# Patient Record
Sex: Male | Born: 1945
Health system: Southern US, Community
[De-identification: ages and names within clinical notes are randomized; demographics above are authoritative.]

## PROBLEM LIST (undated history)

## (undated) DIAGNOSIS — J189 Pneumonia, unspecified organism: Secondary | ICD-10-CM

## (undated) DIAGNOSIS — I499 Cardiac arrhythmia, unspecified: Secondary | ICD-10-CM

## (undated) DIAGNOSIS — T4145XA Adverse effect of unspecified anesthetic, initial encounter: Secondary | ICD-10-CM

## (undated) DIAGNOSIS — Z950 Presence of cardiac pacemaker: Secondary | ICD-10-CM

## (undated) DIAGNOSIS — G2581 Restless legs syndrome: Secondary | ICD-10-CM

## (undated) DIAGNOSIS — N400 Enlarged prostate without lower urinary tract symptoms: Secondary | ICD-10-CM

## (undated) DIAGNOSIS — I509 Heart failure, unspecified: Secondary | ICD-10-CM

## (undated) DIAGNOSIS — F32A Depression, unspecified: Secondary | ICD-10-CM

## (undated) DIAGNOSIS — I739 Peripheral vascular disease, unspecified: Secondary | ICD-10-CM

## (undated) DIAGNOSIS — E119 Type 2 diabetes mellitus without complications: Secondary | ICD-10-CM

## (undated) DIAGNOSIS — R351 Nocturia: Secondary | ICD-10-CM

## (undated) DIAGNOSIS — M549 Dorsalgia, unspecified: Secondary | ICD-10-CM

## (undated) DIAGNOSIS — I10 Essential (primary) hypertension: Secondary | ICD-10-CM

## (undated) DIAGNOSIS — Z95810 Presence of automatic (implantable) cardiac defibrillator: Secondary | ICD-10-CM

## (undated) DIAGNOSIS — I4891 Unspecified atrial fibrillation: Secondary | ICD-10-CM

## (undated) DIAGNOSIS — G4730 Sleep apnea, unspecified: Secondary | ICD-10-CM

## (undated) DIAGNOSIS — F329 Major depressive disorder, single episode, unspecified: Secondary | ICD-10-CM

## (undated) DIAGNOSIS — T8859XA Other complications of anesthesia, initial encounter: Secondary | ICD-10-CM

## (undated) DIAGNOSIS — E1142 Type 2 diabetes mellitus with diabetic polyneuropathy: Secondary | ICD-10-CM

## (undated) DIAGNOSIS — R0600 Dyspnea, unspecified: Secondary | ICD-10-CM

## (undated) DIAGNOSIS — I2510 Atherosclerotic heart disease of native coronary artery without angina pectoris: Secondary | ICD-10-CM

## (undated) DIAGNOSIS — E785 Hyperlipidemia, unspecified: Secondary | ICD-10-CM

## (undated) DIAGNOSIS — G629 Polyneuropathy, unspecified: Secondary | ICD-10-CM

## (undated) DIAGNOSIS — M1990 Unspecified osteoarthritis, unspecified site: Secondary | ICD-10-CM

## (undated) DIAGNOSIS — K219 Gastro-esophageal reflux disease without esophagitis: Secondary | ICD-10-CM

## (undated) DIAGNOSIS — J449 Chronic obstructive pulmonary disease, unspecified: Secondary | ICD-10-CM

## (undated) DIAGNOSIS — M109 Gout, unspecified: Secondary | ICD-10-CM

## (undated) DIAGNOSIS — Z86718 Personal history of other venous thrombosis and embolism: Secondary | ICD-10-CM

## (undated) DIAGNOSIS — I639 Cerebral infarction, unspecified: Secondary | ICD-10-CM

## (undated) HISTORY — PX: TONSILLECTOMY AND ADENOIDECTOMY: SUR1326

## (undated) HISTORY — DX: Cerebral infarction, unspecified: I63.9

## (undated) HISTORY — PX: CARPAL TUNNEL RELEASE: SHX101

## (undated) HISTORY — DX: Cardiac arrhythmia, unspecified: I49.9

## (undated) HISTORY — PX: CATARACT EXTRACTION W/ INTRAOCULAR LENS  IMPLANT, BILATERAL: SHX1307

## (undated) HISTORY — DX: Type 2 diabetes mellitus with diabetic polyneuropathy: E11.42

## (undated) HISTORY — PX: JOINT REPLACEMENT: SHX530

---

## 1987-12-07 HISTORY — PX: ULNAR NERVE TRANSPOSITION: SHX2595

## 1993-12-06 HISTORY — PX: LAPAROSCOPIC CHOLECYSTECTOMY: SUR755

## 2006-12-06 HISTORY — PX: TOTAL KNEE ARTHROPLASTY: SHX125

## 2010-12-06 DIAGNOSIS — I639 Cerebral infarction, unspecified: Secondary | ICD-10-CM

## 2010-12-06 HISTORY — DX: Cerebral infarction, unspecified: I63.9

## 2015-08-01 DIAGNOSIS — I509 Heart failure, unspecified: Secondary | ICD-10-CM | POA: Diagnosis not present

## 2015-08-01 DIAGNOSIS — I10 Essential (primary) hypertension: Secondary | ICD-10-CM | POA: Diagnosis not present

## 2015-08-01 DIAGNOSIS — R202 Paresthesia of skin: Secondary | ICD-10-CM | POA: Diagnosis not present

## 2015-08-01 DIAGNOSIS — M1990 Unspecified osteoarthritis, unspecified site: Secondary | ICD-10-CM | POA: Diagnosis not present

## 2015-08-01 DIAGNOSIS — I480 Paroxysmal atrial fibrillation: Secondary | ICD-10-CM | POA: Diagnosis not present

## 2015-08-01 DIAGNOSIS — R739 Hyperglycemia, unspecified: Secondary | ICD-10-CM | POA: Diagnosis not present

## 2015-08-01 DIAGNOSIS — Z8673 Personal history of transient ischemic attack (TIA), and cerebral infarction without residual deficits: Secondary | ICD-10-CM | POA: Diagnosis not present

## 2015-09-12 DIAGNOSIS — Z8673 Personal history of transient ischemic attack (TIA), and cerebral infarction without residual deficits: Secondary | ICD-10-CM | POA: Diagnosis not present

## 2015-09-12 DIAGNOSIS — I480 Paroxysmal atrial fibrillation: Secondary | ICD-10-CM | POA: Diagnosis not present

## 2015-09-12 DIAGNOSIS — I10 Essential (primary) hypertension: Secondary | ICD-10-CM | POA: Diagnosis not present

## 2015-09-18 DIAGNOSIS — I480 Paroxysmal atrial fibrillation: Secondary | ICD-10-CM | POA: Diagnosis not present

## 2015-09-18 DIAGNOSIS — I10 Essential (primary) hypertension: Secondary | ICD-10-CM | POA: Diagnosis not present

## 2015-09-18 DIAGNOSIS — Z8673 Personal history of transient ischemic attack (TIA), and cerebral infarction without residual deficits: Secondary | ICD-10-CM | POA: Diagnosis not present

## 2015-10-15 DIAGNOSIS — I480 Paroxysmal atrial fibrillation: Secondary | ICD-10-CM | POA: Diagnosis not present

## 2015-10-15 DIAGNOSIS — I4891 Unspecified atrial fibrillation: Secondary | ICD-10-CM | POA: Diagnosis not present

## 2015-10-15 DIAGNOSIS — R0609 Other forms of dyspnea: Secondary | ICD-10-CM | POA: Diagnosis not present

## 2015-10-15 DIAGNOSIS — R931 Abnormal findings on diagnostic imaging of heart and coronary circulation: Secondary | ICD-10-CM | POA: Diagnosis not present

## 2015-10-21 DIAGNOSIS — Z23 Encounter for immunization: Secondary | ICD-10-CM | POA: Diagnosis not present

## 2015-11-03 DIAGNOSIS — Z7984 Long term (current) use of oral hypoglycemic drugs: Secondary | ICD-10-CM | POA: Diagnosis not present

## 2015-11-03 DIAGNOSIS — G6289 Other specified polyneuropathies: Secondary | ICD-10-CM | POA: Diagnosis not present

## 2015-11-03 DIAGNOSIS — I10 Essential (primary) hypertension: Secondary | ICD-10-CM | POA: Diagnosis not present

## 2015-11-03 DIAGNOSIS — E1165 Type 2 diabetes mellitus with hyperglycemia: Secondary | ICD-10-CM | POA: Diagnosis not present

## 2015-11-03 DIAGNOSIS — Z23 Encounter for immunization: Secondary | ICD-10-CM | POA: Diagnosis not present

## 2015-11-03 DIAGNOSIS — I4891 Unspecified atrial fibrillation: Secondary | ICD-10-CM | POA: Diagnosis not present

## 2015-11-04 DIAGNOSIS — I10 Essential (primary) hypertension: Secondary | ICD-10-CM | POA: Diagnosis not present

## 2015-11-04 DIAGNOSIS — I429 Cardiomyopathy, unspecified: Secondary | ICD-10-CM | POA: Diagnosis not present

## 2015-11-04 DIAGNOSIS — Z8673 Personal history of transient ischemic attack (TIA), and cerebral infarction without residual deficits: Secondary | ICD-10-CM | POA: Diagnosis not present

## 2015-11-04 DIAGNOSIS — I480 Paroxysmal atrial fibrillation: Secondary | ICD-10-CM | POA: Diagnosis not present

## 2015-11-24 DIAGNOSIS — M9905 Segmental and somatic dysfunction of pelvic region: Secondary | ICD-10-CM | POA: Diagnosis not present

## 2015-11-24 DIAGNOSIS — S336XXA Sprain of sacroiliac joint, initial encounter: Secondary | ICD-10-CM | POA: Diagnosis not present

## 2015-11-25 DIAGNOSIS — M9905 Segmental and somatic dysfunction of pelvic region: Secondary | ICD-10-CM | POA: Diagnosis not present

## 2015-11-25 DIAGNOSIS — S336XXA Sprain of sacroiliac joint, initial encounter: Secondary | ICD-10-CM | POA: Diagnosis not present

## 2015-12-09 DIAGNOSIS — S336XXA Sprain of sacroiliac joint, initial encounter: Secondary | ICD-10-CM | POA: Diagnosis not present

## 2015-12-09 DIAGNOSIS — M9905 Segmental and somatic dysfunction of pelvic region: Secondary | ICD-10-CM | POA: Diagnosis not present

## 2015-12-15 DIAGNOSIS — M9905 Segmental and somatic dysfunction of pelvic region: Secondary | ICD-10-CM | POA: Diagnosis not present

## 2015-12-15 DIAGNOSIS — S336XXA Sprain of sacroiliac joint, initial encounter: Secondary | ICD-10-CM | POA: Diagnosis not present

## 2015-12-25 DIAGNOSIS — S336XXA Sprain of sacroiliac joint, initial encounter: Secondary | ICD-10-CM | POA: Diagnosis not present

## 2015-12-25 DIAGNOSIS — M9905 Segmental and somatic dysfunction of pelvic region: Secondary | ICD-10-CM | POA: Diagnosis not present

## 2015-12-31 DIAGNOSIS — I4891 Unspecified atrial fibrillation: Secondary | ICD-10-CM | POA: Diagnosis not present

## 2015-12-31 DIAGNOSIS — Z79899 Other long term (current) drug therapy: Secondary | ICD-10-CM | POA: Diagnosis not present

## 2015-12-31 DIAGNOSIS — I517 Cardiomegaly: Secondary | ICD-10-CM | POA: Diagnosis not present

## 2016-01-09 DIAGNOSIS — N200 Calculus of kidney: Secondary | ICD-10-CM | POA: Diagnosis not present

## 2016-01-09 DIAGNOSIS — R1031 Right lower quadrant pain: Secondary | ICD-10-CM | POA: Diagnosis not present

## 2016-01-09 DIAGNOSIS — Z0000 Encounter for general adult medical examination without abnormal findings: Secondary | ICD-10-CM | POA: Diagnosis not present

## 2016-01-09 DIAGNOSIS — Z833 Family history of diabetes mellitus: Secondary | ICD-10-CM | POA: Diagnosis not present

## 2016-01-12 ENCOUNTER — Ambulatory Visit
Admission: RE | Admit: 2016-01-12 | Discharge: 2016-01-12 | Disposition: A | Discharge status: Home / Self Care | Payer: Medicare Other | Source: Ambulatory Visit | Attending: Internal Medicine | Admitting: Internal Medicine

## 2016-01-12 ENCOUNTER — Other Ambulatory Visit: Payer: Self-pay | Admitting: Internal Medicine

## 2016-01-12 DIAGNOSIS — M25551 Pain in right hip: Secondary | ICD-10-CM

## 2016-01-12 DIAGNOSIS — G6289 Other specified polyneuropathies: Secondary | ICD-10-CM | POA: Diagnosis not present

## 2016-01-12 DIAGNOSIS — M545 Low back pain: Secondary | ICD-10-CM

## 2016-01-12 DIAGNOSIS — G8929 Other chronic pain: Secondary | ICD-10-CM

## 2016-01-12 DIAGNOSIS — M5136 Other intervertebral disc degeneration, lumbar region: Secondary | ICD-10-CM | POA: Diagnosis not present

## 2016-01-14 DIAGNOSIS — M545 Low back pain: Secondary | ICD-10-CM | POA: Diagnosis not present

## 2016-01-26 DIAGNOSIS — I4891 Unspecified atrial fibrillation: Secondary | ICD-10-CM | POA: Diagnosis not present

## 2016-01-26 DIAGNOSIS — Z79899 Other long term (current) drug therapy: Secondary | ICD-10-CM | POA: Diagnosis not present

## 2016-02-03 DIAGNOSIS — J019 Acute sinusitis, unspecified: Secondary | ICD-10-CM | POA: Diagnosis not present

## 2016-02-04 DIAGNOSIS — M25561 Pain in right knee: Secondary | ICD-10-CM | POA: Diagnosis not present

## 2016-02-23 DIAGNOSIS — R9431 Abnormal electrocardiogram [ECG] [EKG]: Secondary | ICD-10-CM | POA: Diagnosis not present

## 2016-02-23 DIAGNOSIS — I10 Essential (primary) hypertension: Secondary | ICD-10-CM | POA: Diagnosis not present

## 2016-02-23 DIAGNOSIS — I4510 Unspecified right bundle-branch block: Secondary | ICD-10-CM | POA: Diagnosis not present

## 2016-02-23 DIAGNOSIS — I4891 Unspecified atrial fibrillation: Secondary | ICD-10-CM | POA: Diagnosis not present

## 2016-02-23 DIAGNOSIS — Z8673 Personal history of transient ischemic attack (TIA), and cerebral infarction without residual deficits: Secondary | ICD-10-CM | POA: Diagnosis not present

## 2016-02-23 DIAGNOSIS — I480 Paroxysmal atrial fibrillation: Secondary | ICD-10-CM | POA: Diagnosis not present

## 2016-02-23 DIAGNOSIS — I429 Cardiomyopathy, unspecified: Secondary | ICD-10-CM | POA: Diagnosis not present

## 2016-03-18 ENCOUNTER — Other Ambulatory Visit: Payer: Self-pay | Admitting: Orthopedic Surgery

## 2016-03-18 ENCOUNTER — Encounter: Payer: Self-pay | Admitting: Orthopedic Surgery

## 2016-03-18 DIAGNOSIS — M25561 Pain in right knee: Secondary | ICD-10-CM | POA: Diagnosis not present

## 2016-03-18 NOTE — H&P (Signed)
TOTAL KNEE ADMISSION H&P  Patient is being admitted for right total knee arthroplasty, unicompartmental vs total.  Subjective:  Chief Complaint:right knee pain.  HPI: Willie Pineda, 70 y.o. male, has a history of pain and functional disability in the right knee due to arthritis and has failed non-surgical conservative treatments for greater than 12 weeks to includeNSAID's and/or analgesics, corticosteriod injections, flexibility and strengthening excercises, use of assistive devices and activity modification.  Onset of symptoms was gradual, starting 5 years ago with gradually worsening course since that time. The patient noted no past surgery on the right knee(s).  Patient currently rates pain in the right knee(s) at 2 out of 10 with activity. Patient has night pain, worsening of pain with activity and weight bearing, pain that interferes with activities of daily living and crepitus.  Patient has evidence of joint space narrowing of medial compartment by imaging studies. This patient has had left total knee. There is no active infection.  There are no active problems to display for this patient.  No past medical history on file.  No past surgical history on file.   (Not in a hospital admission) Allergies not on file  Social History  Substance Use Topics  . Smoking status: Not on file  . Smokeless tobacco: Not on file  . Alcohol Use: Not on file    No family history on file.   Review of Systems  Cardiovascular: Positive for palpitations.  Musculoskeletal: Positive for joint pain.  Neurological: Positive for sensory change.    Objective:  Physical Exam  Constitutional: He is oriented to person, place, and time. He appears well-developed and well-nourished.  HENT:  Head: Normocephalic and atraumatic.  Eyes: EOM are normal. Pupils are equal, round, and reactive to light.  Neck: Normal range of motion. Neck supple.  Cardiovascular: Normal rate.  An irregularly irregular rhythm  present.  Occasional extrasystoles are present.  Occasional missed beats-irregular  Respiratory: Effort normal and breath sounds normal.  GI: Soft. Bowel sounds are normal.  Musculoskeletal:       Right knee: He exhibits deformity. Tenderness found. Medial joint line tenderness noted.  Left knee well healed scar from tka  Neurological: He is alert and oriented to person, place, and time.  Decreased light touch throughout bi LE's, R.L secondary to DM  Skin: Skin is warm and dry.  Psychiatric: He has a normal mood and affect. His behavior is normal.    Vital signs in last 24 hours: @VSRANGES @  Labs:   There is no height or weight on file to calculate BMI.   Imaging Review Plain radiographs demonstrate severe degenerative joint disease of the right knee(s). The overall alignment issignificant varus. The bone quality appears to be adequate for age and reported activity level.  Assessment/Plan:  End stage arthritis, right knee   The patient history, physical examination, clinical judgment of the provider and imaging studies are consistent with end stage degenerative joint disease of the right knee(s) and total knee arthroplasty is deemed medically necessary. The treatment options including medical management, injection therapy arthroscopy and arthroplasty were discussed at length. The risks and benefits of total knee arthroplasty were presented and reviewed. The risks due to aseptic loosening, infection, stiffness, patella tracking problems, thromboembolic complications and other imponderables were discussed. The patient acknowledged the explanation, agreed to proceed with the plan and consent was signed. Patient is being admitted for inpatient treatment for surgery, pain control, PT, OT, prophylactic antibiotics, VTE prophylaxis, progressive ambulation and ADL's and discharge  planning. The patient is planning to be discharged home with home health services

## 2016-03-25 ENCOUNTER — Encounter (HOSPITAL_COMMUNITY): Payer: Self-pay

## 2016-03-25 ENCOUNTER — Encounter (HOSPITAL_COMMUNITY)
Admission: RE | Admit: 2016-03-25 | Discharge: 2016-03-25 | Disposition: A | Discharge status: Home / Self Care | Payer: Medicare Other | Source: Ambulatory Visit | Attending: Orthopedic Surgery | Admitting: Orthopedic Surgery

## 2016-03-25 DIAGNOSIS — I10 Essential (primary) hypertension: Secondary | ICD-10-CM | POA: Insufficient documentation

## 2016-03-25 DIAGNOSIS — I428 Other cardiomyopathies: Secondary | ICD-10-CM | POA: Diagnosis not present

## 2016-03-25 DIAGNOSIS — Z01818 Encounter for other preprocedural examination: Secondary | ICD-10-CM | POA: Diagnosis not present

## 2016-03-25 DIAGNOSIS — G629 Polyneuropathy, unspecified: Secondary | ICD-10-CM | POA: Insufficient documentation

## 2016-03-25 DIAGNOSIS — Z8673 Personal history of transient ischemic attack (TIA), and cerebral infarction without residual deficits: Secondary | ICD-10-CM | POA: Diagnosis not present

## 2016-03-25 DIAGNOSIS — J449 Chronic obstructive pulmonary disease, unspecified: Secondary | ICD-10-CM | POA: Diagnosis not present

## 2016-03-25 DIAGNOSIS — Z79899 Other long term (current) drug therapy: Secondary | ICD-10-CM | POA: Insufficient documentation

## 2016-03-25 DIAGNOSIS — Z87891 Personal history of nicotine dependence: Secondary | ICD-10-CM | POA: Diagnosis not present

## 2016-03-25 DIAGNOSIS — Z01812 Encounter for preprocedural laboratory examination: Secondary | ICD-10-CM | POA: Insufficient documentation

## 2016-03-25 DIAGNOSIS — K219 Gastro-esophageal reflux disease without esophagitis: Secondary | ICD-10-CM | POA: Insufficient documentation

## 2016-03-25 DIAGNOSIS — I480 Paroxysmal atrial fibrillation: Secondary | ICD-10-CM | POA: Diagnosis not present

## 2016-03-25 DIAGNOSIS — E785 Hyperlipidemia, unspecified: Secondary | ICD-10-CM | POA: Insufficient documentation

## 2016-03-25 DIAGNOSIS — R7303 Prediabetes: Secondary | ICD-10-CM | POA: Diagnosis not present

## 2016-03-25 DIAGNOSIS — Z0183 Encounter for blood typing: Secondary | ICD-10-CM | POA: Insufficient documentation

## 2016-03-25 DIAGNOSIS — Z7901 Long term (current) use of anticoagulants: Secondary | ICD-10-CM | POA: Insufficient documentation

## 2016-03-25 DIAGNOSIS — M1711 Unilateral primary osteoarthritis, right knee: Secondary | ICD-10-CM | POA: Insufficient documentation

## 2016-03-25 HISTORY — DX: Adverse effect of unspecified anesthetic, initial encounter: T41.45XA

## 2016-03-25 HISTORY — DX: Nocturia: R35.1

## 2016-03-25 HISTORY — DX: Major depressive disorder, single episode, unspecified: F32.9

## 2016-03-25 HISTORY — DX: Depression, unspecified: F32.A

## 2016-03-25 HISTORY — DX: Restless legs syndrome: G25.81

## 2016-03-25 HISTORY — DX: Chronic obstructive pulmonary disease, unspecified: J44.9

## 2016-03-25 HISTORY — DX: Other complications of anesthesia, initial encounter: T88.59XA

## 2016-03-25 HISTORY — DX: Personal history of other venous thrombosis and embolism: Z86.718

## 2016-03-25 HISTORY — DX: Dorsalgia, unspecified: M54.9

## 2016-03-25 HISTORY — DX: Polyneuropathy, unspecified: G62.9

## 2016-03-25 HISTORY — DX: Essential (primary) hypertension: I10

## 2016-03-25 HISTORY — DX: Unspecified osteoarthritis, unspecified site: M19.90

## 2016-03-25 HISTORY — DX: Hyperlipidemia, unspecified: E78.5

## 2016-03-25 HISTORY — DX: Benign prostatic hyperplasia without lower urinary tract symptoms: N40.0

## 2016-03-25 HISTORY — DX: Gastro-esophageal reflux disease without esophagitis: K21.9

## 2016-03-25 HISTORY — DX: Heart failure, unspecified: I50.9

## 2016-03-25 HISTORY — DX: Pneumonia, unspecified organism: J18.9

## 2016-03-25 LAB — TYPE AND SCREEN
ABO/RH(D): O POS
Antibody Screen: NEGATIVE

## 2016-03-25 LAB — CBC WITH DIFFERENTIAL/PLATELET
Basophils Absolute: 0.1 10*3/uL (ref 0.0–0.1)
Basophils Relative: 1 %
Eosinophils Absolute: 0.3 10*3/uL (ref 0.0–0.7)
Eosinophils Relative: 4 %
HCT: 46.7 % (ref 39.0–52.0)
Hemoglobin: 15.5 g/dL (ref 13.0–17.0)
Lymphocytes Relative: 31 %
Lymphs Abs: 2.9 10*3/uL (ref 0.7–4.0)
MCH: 31.1 pg (ref 26.0–34.0)
MCHC: 33.2 g/dL (ref 30.0–36.0)
MCV: 93.6 fL (ref 78.0–100.0)
Monocytes Absolute: 1 10*3/uL (ref 0.1–1.0)
Monocytes Relative: 11 %
Neutro Abs: 5 10*3/uL (ref 1.7–7.7)
Neutrophils Relative %: 53 %
Platelets: 224 10*3/uL (ref 150–400)
RBC: 4.99 MIL/uL (ref 4.22–5.81)
RDW: 13.2 % (ref 11.5–15.5)
WBC: 9.3 10*3/uL (ref 4.0–10.5)

## 2016-03-25 LAB — ABO/RH: ABO/RH(D): O POS

## 2016-03-25 LAB — URINALYSIS, ROUTINE W REFLEX MICROSCOPIC
Bilirubin Urine: NEGATIVE
Glucose, UA: NEGATIVE mg/dL
Hgb urine dipstick: NEGATIVE
Ketones, ur: NEGATIVE mg/dL
Leukocytes, UA: NEGATIVE
Nitrite: NEGATIVE
Protein, ur: NEGATIVE mg/dL
Specific Gravity, Urine: 1.011 (ref 1.005–1.030)
pH: 6.5 (ref 5.0–8.0)

## 2016-03-25 LAB — COMPREHENSIVE METABOLIC PANEL
ALT: 28 U/L (ref 17–63)
AST: 33 U/L (ref 15–41)
Albumin: 3.7 g/dL (ref 3.5–5.0)
Alkaline Phosphatase: 100 U/L (ref 38–126)
Anion gap: 12 (ref 5–15)
BUN: 13 mg/dL (ref 6–20)
CO2: 27 mmol/L (ref 22–32)
Calcium: 9.2 mg/dL (ref 8.9–10.3)
Chloride: 101 mmol/L (ref 101–111)
Creatinine, Ser: 1.03 mg/dL (ref 0.61–1.24)
GFR calc Af Amer: >60 mL/min (ref >60)
GFR calc non Af Amer: >60 mL/min (ref >60)
Glucose, Bld: 150 mg/dL — ABNORMAL HIGH (ref 65–99)
Potassium: 4.2 mmol/L (ref 3.5–5.1)
Sodium: 140 mmol/L (ref 135–145)
Total Bilirubin: 1.2 mg/dL (ref 0.3–1.2)
Total Protein: 7.2 g/dL (ref 6.5–8.1)

## 2016-03-25 LAB — PROTIME-INR
INR: 2.46 — ABNORMAL HIGH (ref 0.00–1.49)
Prothrombin Time: 26.4 seconds — ABNORMAL HIGH (ref 11.6–15.2)

## 2016-03-25 LAB — SURGICAL PCR SCREEN
MRSA, PCR: NEGATIVE
Staphylococcus aureus: POSITIVE — AB

## 2016-03-25 LAB — APTT: aPTT: 41 seconds — ABNORMAL HIGH (ref 24–37)

## 2016-03-25 MED ORDER — CHLORHEXIDINE GLUCONATE 4 % EX LIQD: 60.0000 mL | Freq: Once | CUTANEOUS | Status: DC

## 2016-03-25 NOTE — Progress Notes (Addendum)
Anesthesia PAT Evaluation: Patient is a 70 year old male scheduled for right knee unicompartmental versus total joint arthroplasty on 04/06/16 by Dr. Edmonia Lynch. Case is posted for general anesthesia but patient reports he prefers spinal anesthesia if possible.  History includes former smoker, GERD, HLD, HTN, afib/PAF, non-ischemic cardiomyopathy (by cardiology notes; felt to be tachycardia-induced), CHF, peripheral neuropathy, depression, glaucoma, DM2 (borderline), BPH, RLS, COPD, CVA X 3 (one event) with primary infarct to brain stem ~ 2012 (had issues with dysphagia s/p ST X 6 months; no significant residual), cholecystectomy, eye surgery, right ulnar surgery, left TKA '08. For anesthesia history, he reported bronchospasms for 6 weeks after intubation. (There are instances even without anesthesia where he has developed a bronchospasm; worse after drinking wine in combination with laughing. 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  PCP is Dr. Collier Flowers with [facility]. Cardiologist is Dr. Lamar Blinks with [facility] He was seen by Cathlean Sauer, NP on 02/23/16 for a preoperative evaluation. She writes, "He is okay form a cardiovascular standpoint to proceed with knee surgery." She discussed holding Xarelto for 2 days prior to surgery (we discussed 3 days to be considered for spinal anesthesia). 9 month follow-up with Dr. Mauricio Po with updated echo prior to that visit is planned.   Meds include albuterol, amiodarone, Lipitor, clonidine, Lasix, gabapentin, HCTZ, Xalatan ophthalmic, Spiriva, Zoloft, Xarelto, Prilosec, Toprol XL, losartan.  PAT Vitals: BP 145/92, HR 63, RR 20, T 36.4C, O2 sat 96%. WT 301 lb (137 kg). Patient is a pleasant Caucasian male in NAD. Heart sounds regular today. No murmur noted. No carotid bruits noted. Lungs clear. No ankle edema. Good mouth opening. Denied  false or loose teeth.  11/04/15 EKG ([facility]; Care Everywhere): Result Impression: Atrial fibrillation. Right bundle branch block and right axis -possible right ventricular hypertrophy. Consider pulmonary disease. (Tracing requested)  09/18/15 Echo: Requested.  10/15/15 Nuclear stress test: Requested.  He denied history of cardiac cath. Reports previous cardiac testing done at [facility].  He reported a negative sleep study while living in [place].  12/31/15 2VCXR ([facility]; Care Everywhere): INDICATION: Amiodarone therapy FINDINGS: Borderline cardiomegaly. The lungs and pleural spaces are clear. Bony thorax intact. Cholecystectomy clips.  12/31/15 Spirometry ([facility], Care Everywhere): Result Impression:  The diffusing capacity is normal.  Recommend a complete pulmonary function tests with lung volumes to assess for restriction. (Dr. Dionne Milo)  Preoperative labs noted. PT/INR 26.4/2.46, PTT 41. He is still no Xarelto. Plan to repeat PT/PTT on the day of surgery.    Will revisit chart once cardiology tests received. We discussed potential anesthesia options for his surgery. He is comfortable discussing definitive anesthesia plan with his assigned anesthesiologist on the day of surgery.  George Hugh [facility] Short Stay Center/Anesthesiology Phone [phone] 03/25/2016 11:41 AM  Addendum: Additional records received.   - 11/12/11 Polysomnogram [facility]): IMPRESSION: Moderate obstructive sleep apnea. Increased periodic limb movements of sleep. RECOMMENDATIONS: Recommend the patient return to the sleep center for a full night CPAP titration.   From [facility]:  - 02/23/16 EKG: Afib at 85 bpm, occasional ectopic ventricular beat, right BBB and right axis, possible RVH, consider pulmonary disease pattern.  - 10/15/15 Nuclear stress test: Impression: There is no scintigraphic evidence of inducible myocardial ischemia. The  post-stress EF is 39%. Global LV systolic function is moderately reduced. Non-diagnostic ECG. TID ratio is elevated.  Clinical correlation recommended.  - 09/18/15 Echo: The study was technically difficult. The LV is normal in size. Moderate concentric LVH. Moderate diffuse hypokinesis in the LV. LVEF is moderately reduced at 40-45%. The LV is not well visualized. LV diastolic function is normal. LA is moderately dilated. Mild 1+ MR. AV is not well visualized, but is grossly normal. Mild 1+ TR. RVSP is elevated between 30-40 mmHg, consistent with mild pulmonary hypertension. Mild 1+ PR.  12/31/15 Spirometry: FVC 2.79 (61%), FEV1 2.13 (63%), DLCO 23.2 (87%).  As above, patient has been cleared by cardiology. Further evaluation by his anesthesiologist on the day of surgery to ensure no acute changes and to discuss definitive anesthesia plan.  George Hugh [facility] Short Stay Center/Anesthesiology Phone [phone] 03/30/2016 4:39 PM

## 2016-03-25 NOTE — Progress Notes (Signed)
Mupirocin script called into the [facility] in [place].Pt notified who verbalized understanding of instructions.

## 2016-03-25 NOTE — Progress Notes (Addendum)
Cardiologist is Dr.Gerald Renaldo,sees in [place] is [place]  Echo 6 months ago-to request  Stress test 6 months ago-to request  Heart cath denies  EKG 2 wks ago-to request  CXR denies in past yr,last one Jan 2016

## 2016-03-25 NOTE — Progress Notes (Signed)
   03/25/16 1041  OBSTRUCTIVE SLEEP APNEA  Have you ever been diagnosed with sleep apnea through a sleep study? No  Do you snore loudly (loud enough to be heard through closed doors)?  1  Do you often feel tired, fatigued, or sleepy during the daytime (such as falling asleep during driving or talking to someone)? 0  Has anyone observed you stop breathing during your sleep? 0  Do you have, or are you being treated for high blood pressure? 1  Age > 50 (1-yes) 1  Neck circumference greater than:Male 16 inches or larger, Male 17inches or larger? 1  Male Gender (Yes=1) 1  Obstructive Sleep Apnea Score 5  Score 5 or greater  Results sent to PCP

## 2016-03-25 NOTE — Pre-Procedure Instructions (Signed)
Willie Pineda  03/25/2016      [facility] - [place], [place] [address] [name] [address] Phone: [phone] Fax: [phone]    Your procedure is scheduled on Tues, May 2 @ 7:30 AM  Report to [facility] Admitting at 5:30 AM  Call this number if you have problems the morning of surgery:  [phone]   Remember:  Do not eat food or drink liquids after midnight.  Take these medicines the morning of surgery with A SIP OF WATER Albuterol<Bring Your Inhaler With You>,Amiodarone(Pacerone),Clonidine(Catapres-if needed),Metoprolol(Toprol),Omeprazole(Prilosec),Sertraline(Zoloft),and Spiriva(if needed)               No Goody's,BC's,Aleve,Advil,Motrin,Aspirin,Ibuprofen,Fish Oil,or any herbal medications.    Do not wear jewelry.  Do not wear lotions, powders, or colognes.  Men may shave face and neck.  Do not bring valuables to the hospital.  [facility] is not responsible for any belongings or valuables.  Contacts, dentures or bridgework may not be worn into surgery.  Leave your suitcase in the car.  After surgery it may be brought to your room.  For patients admitted to the hospital, discharge time will be determined by your treatment team.  Patients discharged the day of surgery will not be allowed to drive home.    Special instructions:  [place] - Preparing for Surgery  Before surgery, you can play an important role.  Because skin is not sterile, your skin needs to be as free of germs as possible.  You can reduce the number of germs on you skin by washing with CHG (chlorahexidine gluconate) soap before surgery.  CHG is an antiseptic cleaner which kills germs and bonds with the skin to continue killing germs even after washing.  Please DO NOT use if you have an allergy to CHG or antibacterial soaps.  If your skin becomes reddened/irritated stop using the CHG and inform your nurse when you  arrive at Short Stay.  Do not shave (including legs and underarms) for at least 48 hours prior to the first CHG shower.  You may shave your face.  Please follow these instructions carefully:   1.  Shower with CHG Soap the night before surgery and the                                morning of Surgery.  2.  If you choose to wash your hair, wash your hair first as usual with your       normal shampoo.  3.  After you shampoo, rinse your hair and body thoroughly to remove the                      Shampoo.  4.  Use CHG as you would any other liquid soap.  You can apply chg directly       to the skin and wash gently with scrungie or a clean washcloth.  5.  Apply the CHG Soap to your body ONLY FROM THE NECK DOWN.        Do not use on open wounds or open sores.  Avoid contact with your eyes,       ears, mouth and genitals (private parts).  Wash genitals (private parts)       with your normal soap.  6.  Wash thoroughly, paying special attention  to the area where your surgery        will be performed.  7.  Thoroughly rinse your body with warm water from the neck down.  8.  DO NOT shower/wash with your normal soap after using and rinsing off       the CHG Soap.  9.  Pat yourself dry with a clean towel.            10.  Wear clean pajamas.            11.  Place clean sheets on your bed the night of your first shower and do not        sleep with pets.  Day of Surgery  Do not apply any lotions/deoderants the morning of surgery.  Please wear clean clothes to the hospital/surgery center.    Please read over the following fact sheets that you were given. Pain Booklet, Coughing and Deep Breathing, Blood Transfusion Information, MRSA Information and Surgical Site Infection Prevention

## 2016-03-26 DIAGNOSIS — H5015 Alternating exotropia: Secondary | ICD-10-CM | POA: Diagnosis not present

## 2016-03-26 DIAGNOSIS — Z961 Presence of intraocular lens: Secondary | ICD-10-CM | POA: Diagnosis not present

## 2016-03-26 DIAGNOSIS — H40013 Open angle with borderline findings, low risk, bilateral: Secondary | ICD-10-CM | POA: Diagnosis not present

## 2016-03-26 DIAGNOSIS — E119 Type 2 diabetes mellitus without complications: Secondary | ICD-10-CM | POA: Diagnosis not present

## 2016-04-05 MED ORDER — TRANEXAMIC ACID 1000 MG/10ML IV SOLN
1000.0000 mg | INTRAVENOUS | Status: AC
  Administered 2016-04-06: 1000 mg via INTRAVENOUS
  Filled 2016-04-05: qty 10

## 2016-04-06 ENCOUNTER — Inpatient Hospital Stay (HOSPITAL_COMMUNITY)
Admission: RE | Admit: 2016-04-06 | Discharge: 2016-04-07 | DRG: 470 | Disposition: A | Discharge status: Home / Self Care | Payer: Medicare Other | Source: Ambulatory Visit | Attending: Orthopedic Surgery | Admitting: Orthopedic Surgery

## 2016-04-06 ENCOUNTER — Inpatient Hospital Stay (HOSPITAL_COMMUNITY): Payer: Medicare Other | Admitting: Certified Registered Nurse Anesthetist

## 2016-04-06 ENCOUNTER — Inpatient Hospital Stay (HOSPITAL_COMMUNITY): Payer: Medicare Other | Admitting: Vascular Surgery

## 2016-04-06 ENCOUNTER — Encounter (HOSPITAL_COMMUNITY)
Admission: RE | Disposition: A | Discharge status: Home / Self Care | Payer: Self-pay | Source: Ambulatory Visit | Attending: Orthopedic Surgery

## 2016-04-06 ENCOUNTER — Encounter (HOSPITAL_COMMUNITY): Payer: Self-pay | Admitting: *Deleted

## 2016-04-06 DIAGNOSIS — E785 Hyperlipidemia, unspecified: Secondary | ICD-10-CM | POA: Diagnosis present

## 2016-04-06 DIAGNOSIS — N400 Enlarged prostate without lower urinary tract symptoms: Secondary | ICD-10-CM | POA: Diagnosis present

## 2016-04-06 DIAGNOSIS — M1711 Unilateral primary osteoarthritis, right knee: Secondary | ICD-10-CM | POA: Diagnosis present

## 2016-04-06 DIAGNOSIS — J449 Chronic obstructive pulmonary disease, unspecified: Secondary | ICD-10-CM | POA: Diagnosis present

## 2016-04-06 DIAGNOSIS — F329 Major depressive disorder, single episode, unspecified: Secondary | ICD-10-CM | POA: Diagnosis present

## 2016-04-06 DIAGNOSIS — Z7901 Long term (current) use of anticoagulants: Secondary | ICD-10-CM

## 2016-04-06 DIAGNOSIS — M25561 Pain in right knee: Secondary | ICD-10-CM | POA: Diagnosis not present

## 2016-04-06 DIAGNOSIS — G2581 Restless legs syndrome: Secondary | ICD-10-CM | POA: Diagnosis present

## 2016-04-06 DIAGNOSIS — M179 Osteoarthritis of knee, unspecified: Secondary | ICD-10-CM | POA: Diagnosis present

## 2016-04-06 DIAGNOSIS — Z96651 Presence of right artificial knee joint: Secondary | ICD-10-CM | POA: Diagnosis not present

## 2016-04-06 DIAGNOSIS — Z8673 Personal history of transient ischemic attack (TIA), and cerebral infarction without residual deficits: Secondary | ICD-10-CM

## 2016-04-06 DIAGNOSIS — I4891 Unspecified atrial fibrillation: Secondary | ICD-10-CM | POA: Diagnosis present

## 2016-04-06 DIAGNOSIS — H409 Unspecified glaucoma: Secondary | ICD-10-CM | POA: Diagnosis present

## 2016-04-06 DIAGNOSIS — I110 Hypertensive heart disease with heart failure: Secondary | ICD-10-CM | POA: Diagnosis present

## 2016-04-06 DIAGNOSIS — Z471 Aftercare following joint replacement surgery: Secondary | ICD-10-CM | POA: Diagnosis not present

## 2016-04-06 DIAGNOSIS — Z79899 Other long term (current) drug therapy: Secondary | ICD-10-CM | POA: Diagnosis not present

## 2016-04-06 DIAGNOSIS — G8918 Other acute postprocedural pain: Secondary | ICD-10-CM | POA: Diagnosis not present

## 2016-04-06 DIAGNOSIS — Z87891 Personal history of nicotine dependence: Secondary | ICD-10-CM

## 2016-04-06 DIAGNOSIS — M1710 Unilateral primary osteoarthritis, unspecified knee: Secondary | ICD-10-CM | POA: Diagnosis present

## 2016-04-06 DIAGNOSIS — K219 Gastro-esophageal reflux disease without esophagitis: Secondary | ICD-10-CM | POA: Diagnosis present

## 2016-04-06 DIAGNOSIS — I509 Heart failure, unspecified: Secondary | ICD-10-CM | POA: Diagnosis present

## 2016-04-06 DIAGNOSIS — E1142 Type 2 diabetes mellitus with diabetic polyneuropathy: Secondary | ICD-10-CM | POA: Diagnosis present

## 2016-04-06 DIAGNOSIS — Z9889 Other specified postprocedural states: Secondary | ICD-10-CM

## 2016-04-06 HISTORY — PX: PARTIAL KNEE ARTHROPLASTY: SHX2174

## 2016-04-06 LAB — GLUCOSE, CAPILLARY
Glucose-Capillary: 119 mg/dL — ABNORMAL HIGH (ref 65–99)
Glucose-Capillary: 112 mg/dL — ABNORMAL HIGH (ref 65–99)

## 2016-04-06 LAB — APTT: aPTT: 27 seconds (ref 24–37)

## 2016-04-06 LAB — PROTIME-INR
INR: 1.13 (ref 0.00–1.49)
Prothrombin Time: 14.7 seconds (ref 11.6–15.2)

## 2016-04-06 SURGERY — ARTHROPLASTY, KNEE, UNICOMPARTMENTAL
Anesthesia: General | Site: Knee | Laterality: Right

## 2016-04-06 MED ORDER — ARTIFICIAL TEARS OP OINT
TOPICAL_OINTMENT | OPHTHALMIC | Status: AC
  Filled 2016-04-06: qty 3.5

## 2016-04-06 MED ORDER — METOCLOPRAMIDE HCL 5 MG PO TABS: 5.0000 mg | ORAL_TABLET | Freq: Three times a day (TID) | ORAL | Status: DC | PRN

## 2016-04-06 MED ORDER — METHOCARBAMOL 500 MG PO TABS
ORAL_TABLET | ORAL | Status: AC
  Filled 2016-04-06: qty 1

## 2016-04-06 MED ORDER — MORPHINE SULFATE (PF) 2 MG/ML IV SOLN
2.0000 mg | INTRAVENOUS | Status: DC | PRN
  Administered 2016-04-06: 2 mg via INTRAVENOUS
  Filled 2016-04-06: qty 1

## 2016-04-06 MED ORDER — HYDROCHLOROTHIAZIDE 25 MG PO TABS
25.0000 mg | ORAL_TABLET | Freq: Every day | ORAL | Status: DC
  Administered 2016-04-07: 25 mg via ORAL
  Filled 2016-04-06: qty 1

## 2016-04-06 MED ORDER — PHENOL 1.4 % MT LIQD: 1.0000 | OROMUCOSAL | Status: DC | PRN

## 2016-04-06 MED ORDER — ALBUTEROL SULFATE (2.5 MG/3ML) 0.083% IN NEBU
3.0000 mL | INHALATION_SOLUTION | RESPIRATORY_TRACT | Status: DC | PRN
Start: 2016-04-06 — End: 2016-04-07

## 2016-04-06 MED ORDER — METHOCARBAMOL 1000 MG/10ML IJ SOLN
500.0000 mg | Freq: Four times a day (QID) | INTRAMUSCULAR | Status: DC | PRN
  Filled 2016-04-06: qty 5

## 2016-04-06 MED ORDER — FENTANYL CITRATE (PF) 100 MCG/2ML IJ SOLN
INTRAMUSCULAR | Status: DC | PRN
  Administered 2016-04-06 (×2): 50 ug via INTRAVENOUS

## 2016-04-06 MED ORDER — BUPIVACAINE IN DEXTROSE 0.75-8.25 % IT SOLN
INTRATHECAL | Status: DC | PRN
  Administered 2016-04-06: 2 mL via INTRATHECAL

## 2016-04-06 MED ORDER — CLINDAMYCIN PHOSPHATE 900 MG/50ML IV SOLN
INTRAVENOUS | Status: AC
Start: 2016-04-06 — End: 2016-04-06
  Administered 2016-04-06: 900 mg via INTRAVENOUS
  Filled 2016-04-06: qty 50

## 2016-04-06 MED ORDER — MIDAZOLAM HCL 5 MG/5ML IJ SOLN
INTRAMUSCULAR | Status: DC | PRN
  Administered 2016-04-06: 2 mg via INTRAVENOUS

## 2016-04-06 MED ORDER — AMIODARONE HCL 100 MG PO TABS
200.0000 mg | ORAL_TABLET | Freq: Every day | ORAL | Status: DC
  Administered 2016-04-07: 200 mg via ORAL
  Filled 2016-04-06: qty 2

## 2016-04-06 MED ORDER — GABAPENTIN 300 MG PO CAPS
600.0000 mg | ORAL_CAPSULE | Freq: Every day | ORAL | Status: DC
  Administered 2016-04-06: 600 mg via ORAL
  Filled 2016-04-06: qty 2

## 2016-04-06 MED ORDER — PANTOPRAZOLE SODIUM 40 MG PO TBEC
40.0000 mg | DELAYED_RELEASE_TABLET | Freq: Every day | ORAL | Status: DC
  Administered 2016-04-07: 40 mg via ORAL
  Filled 2016-04-06: qty 1

## 2016-04-06 MED ORDER — FENTANYL CITRATE (PF) 250 MCG/5ML IJ SOLN
INTRAMUSCULAR | Status: AC
  Filled 2016-04-06: qty 5

## 2016-04-06 MED ORDER — ATORVASTATIN CALCIUM 20 MG PO TABS
20.0000 mg | ORAL_TABLET | Freq: Every day | ORAL | Status: DC
  Administered 2016-04-07: 20 mg via ORAL
  Filled 2016-04-06: qty 1

## 2016-04-06 MED ORDER — 0.9 % SODIUM CHLORIDE (POUR BTL) OPTIME
TOPICAL | Status: DC | PRN
  Administered 2016-04-06: 1000 mL

## 2016-04-06 MED ORDER — ONDANSETRON HCL 4 MG/2ML IJ SOLN: 4.0000 mg | Freq: Four times a day (QID) | INTRAMUSCULAR | Status: DC | PRN

## 2016-04-06 MED ORDER — ACETAMINOPHEN 650 MG RE SUPP: 650.0000 mg | Freq: Four times a day (QID) | RECTAL | Status: DC | PRN

## 2016-04-06 MED ORDER — SODIUM CHLORIDE 0.9 % IR SOLN
Status: DC | PRN
  Administered 2016-04-06 (×2): 1000 mL

## 2016-04-06 MED ORDER — LATANOPROST 0.005 % OP SOLN
1.0000 [drp] | Freq: Every day | OPHTHALMIC | Status: DC
  Administered 2016-04-06: 1 [drp] via OPHTHALMIC
  Filled 2016-04-06: qty 2.5

## 2016-04-06 MED ORDER — SERTRALINE HCL 100 MG PO TABS
100.0000 mg | ORAL_TABLET | Freq: Every day | ORAL | Status: DC
  Administered 2016-04-07: 100 mg via ORAL
  Filled 2016-04-06: qty 1

## 2016-04-06 MED ORDER — METOCLOPRAMIDE HCL 5 MG/ML IJ SOLN: 5.0000 mg | Freq: Three times a day (TID) | INTRAMUSCULAR | Status: DC | PRN

## 2016-04-06 MED ORDER — PROPOFOL 10 MG/ML IV BOLUS
INTRAVENOUS | Status: AC
  Filled 2016-04-06: qty 40

## 2016-04-06 MED ORDER — FUROSEMIDE 40 MG PO TABS
80.0000 mg | ORAL_TABLET | Freq: Two times a day (BID) | ORAL | Status: DC
  Administered 2016-04-07: 80 mg via ORAL
  Filled 2016-04-06: qty 2

## 2016-04-06 MED ORDER — OXYCODONE HCL 5 MG PO TABS
ORAL_TABLET | ORAL | Status: AC
  Filled 2016-04-06: qty 2

## 2016-04-06 MED ORDER — LOSARTAN POTASSIUM 50 MG PO TABS
100.0000 mg | ORAL_TABLET | Freq: Every day | ORAL | Status: DC
  Administered 2016-04-07: 100 mg via ORAL
  Filled 2016-04-06: qty 2

## 2016-04-06 MED ORDER — ONDANSETRON HCL 4 MG/2ML IJ SOLN
INTRAMUSCULAR | Status: AC
  Filled 2016-04-06: qty 2

## 2016-04-06 MED ORDER — PHENYLEPHRINE HCL 10 MG/ML IJ SOLN
INTRAMUSCULAR | Status: DC | PRN
  Administered 2016-04-06 (×2): 80 ug via INTRAVENOUS

## 2016-04-06 MED ORDER — DOCUSATE SODIUM 100 MG PO CAPS
100.0000 mg | ORAL_CAPSULE | Freq: Two times a day (BID) | ORAL | Status: DC
  Administered 2016-04-07: 100 mg via ORAL
  Filled 2016-04-06: qty 1

## 2016-04-06 MED ORDER — ROCURONIUM BROMIDE 50 MG/5ML IV SOLN
INTRAVENOUS | Status: AC
  Filled 2016-04-06: qty 1

## 2016-04-06 MED ORDER — CLINDAMYCIN PHOSPHATE 600 MG/50ML IV SOLN
600.0000 mg | Freq: Four times a day (QID) | INTRAVENOUS | Status: AC
  Administered 2016-04-06: 600 mg via INTRAVENOUS
  Filled 2016-04-06 (×2): qty 50

## 2016-04-06 MED ORDER — CLINDAMYCIN PHOSPHATE 900 MG/50ML IV SOLN: 900.0000 mg | INTRAVENOUS | Status: DC

## 2016-04-06 MED ORDER — CLONIDINE HCL 0.1 MG PO TABS: 0.1000 mg | ORAL_TABLET | Freq: Every day | ORAL | Status: DC | PRN

## 2016-04-06 MED ORDER — RIVAROXABAN 20 MG PO TABS
20.0000 mg | ORAL_TABLET | Freq: Every day | ORAL | Status: DC
Start: 2016-04-06 — End: 2016-04-07

## 2016-04-06 MED ORDER — CELECOXIB 200 MG PO CAPS
200.0000 mg | ORAL_CAPSULE | Freq: Two times a day (BID) | ORAL | Status: DC
  Administered 2016-04-06 – 2016-04-07 (×2): 200 mg via ORAL
  Filled 2016-04-06 (×2): qty 1

## 2016-04-06 MED ORDER — OXYCODONE HCL 5 MG PO TABS
5.0000 mg | ORAL_TABLET | ORAL | Status: DC | PRN
  Administered 2016-04-06 – 2016-04-07 (×7): 10 mg via ORAL
  Filled 2016-04-06 (×6): qty 2

## 2016-04-06 MED ORDER — PROPOFOL 500 MG/50ML IV EMUL
INTRAVENOUS | Status: DC | PRN
  Administered 2016-04-06: 10:00:00 via INTRAVENOUS
  Administered 2016-04-06: 75 ug/kg/min via INTRAVENOUS

## 2016-04-06 MED ORDER — PROPOFOL 10 MG/ML IV BOLUS
INTRAVENOUS | Status: DC | PRN
  Administered 2016-04-06: 40 mg via INTRAVENOUS

## 2016-04-06 MED ORDER — LACTATED RINGERS IV SOLN
INTRAVENOUS | Status: DC | PRN
  Administered 2016-04-06 (×2): via INTRAVENOUS

## 2016-04-06 MED ORDER — ACETAMINOPHEN 325 MG PO TABS: 650.0000 mg | ORAL_TABLET | Freq: Four times a day (QID) | ORAL | Status: DC | PRN

## 2016-04-06 MED ORDER — DEXAMETHASONE SODIUM PHOSPHATE 10 MG/ML IJ SOLN
10.0000 mg | Freq: Once | INTRAMUSCULAR | Status: AC
  Administered 2016-04-07: 10 mg via INTRAVENOUS
  Filled 2016-04-06: qty 1

## 2016-04-06 MED ORDER — SODIUM CHLORIDE 0.9 % IV SOLN: INTRAVENOUS | Status: DC

## 2016-04-06 MED ORDER — METOPROLOL SUCCINATE ER 50 MG PO TB24
50.0000 mg | ORAL_TABLET | Freq: Every day | ORAL | Status: DC
  Administered 2016-04-07: 50 mg via ORAL
  Filled 2016-04-06: qty 1

## 2016-04-06 MED ORDER — TIOTROPIUM BROMIDE MONOHYDRATE 18 MCG IN CAPS
18.0000 ug | ORAL_CAPSULE | Freq: Every day | RESPIRATORY_TRACT | Status: DC
  Administered 2016-04-07: 18 ug via RESPIRATORY_TRACT
  Filled 2016-04-06: qty 5

## 2016-04-06 MED ORDER — DEXTROSE-NACL 5-0.45 % IV SOLN
INTRAVENOUS | Status: AC
  Administered 2016-04-06: 14:00:00 via INTRAVENOUS

## 2016-04-06 MED ORDER — METHOCARBAMOL 500 MG PO TABS
500.0000 mg | ORAL_TABLET | Freq: Four times a day (QID) | ORAL | Status: DC | PRN
  Administered 2016-04-06 – 2016-04-07 (×2): 500 mg via ORAL
  Filled 2016-04-06: qty 1

## 2016-04-06 MED ORDER — ONDANSETRON HCL 4 MG PO TABS: 4.0000 mg | ORAL_TABLET | Freq: Four times a day (QID) | ORAL | Status: DC | PRN

## 2016-04-06 MED ORDER — PHENYLEPHRINE HCL 10 MG/ML IJ SOLN
10.0000 mg | INTRAVENOUS | Status: DC | PRN
  Administered 2016-04-06: 30 ug/min via INTRAVENOUS

## 2016-04-06 MED ORDER — MENTHOL 3 MG MT LOZG: 1.0000 | LOZENGE | OROMUCOSAL | Status: DC | PRN

## 2016-04-06 MED ORDER — MIDAZOLAM HCL 2 MG/2ML IJ SOLN
INTRAMUSCULAR | Status: AC
  Filled 2016-04-06: qty 2

## 2016-04-06 MED ORDER — BUPIVACAINE-EPINEPHRINE (PF) 0.5% -1:200000 IJ SOLN
INTRAMUSCULAR | Status: DC | PRN
  Administered 2016-04-06: 30 mL via PERINEURAL

## 2016-04-06 SURGICAL SUPPLY — 57 items
BANDAGE ESMARK 6X9 LF (GAUZE/BANDAGES/DRESSINGS) ×1 IMPLANT
BNDG ELASTIC 6X15 VLCR STRL LF (GAUZE/BANDAGES/DRESSINGS) ×2 IMPLANT
BNDG ESMARK 6X9 LF (GAUZE/BANDAGES/DRESSINGS) ×2
BONE CEMENT PALACOSE (Orthopedic Implant) ×4 IMPLANT
BOWL SMART MIX CTS (DISPOSABLE) ×2 IMPLANT
CAPT KNEE PARTIAL 2 ×2 IMPLANT
CEMENT BONE PALACOSE (Orthopedic Implant) ×2 IMPLANT
CHLORAPREP W/TINT 26ML (MISCELLANEOUS) ×2 IMPLANT
CLSR STERI-STRIP ANTIMIC 1/2X4 (GAUZE/BANDAGES/DRESSINGS) IMPLANT
COVER SURGICAL LIGHT HANDLE (MISCELLANEOUS) ×2 IMPLANT
CUFF TOURNIQUET SINGLE 34IN LL (TOURNIQUET CUFF) ×2 IMPLANT
DRAPE EXTREMITY T 121X128X90 (DRAPE) ×2 IMPLANT
DRAPE IMP U-DRAPE 54X76 (DRAPES) ×4 IMPLANT
DRAPE PROXIMA HALF (DRAPES) IMPLANT
DRAPE U-SHAPE 47X51 STRL (DRAPES) ×2 IMPLANT
DRSG MEPILEX BORDER 4X4 (GAUZE/BANDAGES/DRESSINGS) IMPLANT
DRSG MEPILEX BORDER 4X8 (GAUZE/BANDAGES/DRESSINGS) ×2 IMPLANT
ELECT CAUTERY BLADE 6.4 (BLADE) ×2 IMPLANT
ELECT REM PT RETURN 9FT ADLT (ELECTROSURGICAL) ×2
ELECTRODE REM PT RTRN 9FT ADLT (ELECTROSURGICAL) ×1 IMPLANT
EVACUATOR 1/8 PVC DRAIN (DRAIN) IMPLANT
FACESHIELD WRAPAROUND (MASK) IMPLANT
GLOVE BIO SURGEON STRL SZ7 (GLOVE) ×2 IMPLANT
GLOVE BIO SURGEON STRL SZ7.5 (GLOVE) ×6 IMPLANT
GLOVE BIO SURGEON STRL SZ8 (GLOVE) ×2 IMPLANT
GLOVE BIOGEL PI IND STRL 7.0 (GLOVE) ×1 IMPLANT
GLOVE BIOGEL PI IND STRL 8 (GLOVE) ×3 IMPLANT
GLOVE BIOGEL PI INDICATOR 7.0 (GLOVE) ×1
GLOVE BIOGEL PI INDICATOR 8 (GLOVE) ×3
GLOVE SURG SS PI 8.0 STRL IVOR (GLOVE) ×4 IMPLANT
GOWN STRL REUS W/ TWL LRG LVL3 (GOWN DISPOSABLE) ×4 IMPLANT
GOWN STRL REUS W/ TWL XL LVL3 (GOWN DISPOSABLE) ×2 IMPLANT
GOWN STRL REUS W/TWL LRG LVL3 (GOWN DISPOSABLE) ×4
GOWN STRL REUS W/TWL XL LVL3 (GOWN DISPOSABLE) ×2
HANDPIECE INTERPULSE COAX TIP (DISPOSABLE) ×1
IMMOBILIZER KNEE 22 UNIV (SOFTGOODS) ×2 IMPLANT
KIT BASIN OR (CUSTOM PROCEDURE TRAY) ×2 IMPLANT
KIT ROOM TURNOVER OR (KITS) ×2 IMPLANT
MANIFOLD NEPTUNE II (INSTRUMENTS) ×2 IMPLANT
MARKER SKIN DUAL TIP RULER LAB (MISCELLANEOUS) ×2 IMPLANT
NS IRRIG 1000ML POUR BTL (IV SOLUTION) ×2 IMPLANT
PACK BLADE SAW RECIP 70 3 PT (BLADE) ×2 IMPLANT
PACK TOTAL JOINT (CUSTOM PROCEDURE TRAY) ×2 IMPLANT
PACK UNIVERSAL I (CUSTOM PROCEDURE TRAY) IMPLANT
PAD ARMBOARD 7.5X6 YLW CONV (MISCELLANEOUS) ×4 IMPLANT
SET HNDPC FAN SPRY TIP SCT (DISPOSABLE) ×1 IMPLANT
STAPLER VISISTAT 35W (STAPLE) IMPLANT
SUCTION FRAZIER HANDLE 10FR (MISCELLANEOUS) ×1
SUCTION TUBE FRAZIER 10FR DISP (MISCELLANEOUS) ×1 IMPLANT
SUT MNCRL AB 4-0 PS2 18 (SUTURE) IMPLANT
SUT MON AB 2-0 CT1 27 (SUTURE) ×4 IMPLANT
SUT VIC AB 1 CTX 36 (SUTURE) ×2
SUT VIC AB 1 CTX36XBRD ANBCTR (SUTURE) ×2 IMPLANT
TOWEL OR 17X24 6PK STRL BLUE (TOWEL DISPOSABLE) IMPLANT
TOWEL OR 17X26 10 PK STRL BLUE (TOWEL DISPOSABLE) ×2 IMPLANT
TUBE CONNECTING 12X1/4 (SUCTIONS) ×2 IMPLANT
YANKAUER SUCT BULB TIP NO VENT (SUCTIONS) ×2 IMPLANT

## 2016-04-06 NOTE — Anesthesia Procedure Notes (Addendum)
Spinal Patient location during procedure: OR Start time: 04/06/2016 7:45 AM End time: 04/06/2016 7:50 AM Staffing Anesthesiologist: Lillia Abed Performed by: anesthesiologist  Preanesthetic Checklist Completed: patient identified, site marked, surgical consent, pre-op evaluation, timeout performed, IV checked, risks and benefits discussed and monitors and equipment checked Spinal Block Patient position: sitting Prep: Betadine Patient monitoring: heart rate, cardiac monitor, continuous pulse ox and blood pressure Approach: midline Location: L3-4 Injection technique: single-shot Needle Needle type: Pencan  Needle gauge: 24 G Needle length: 9 cm Needle insertion depth: 10 cm  Procedure Name: MAC Date/Time: 04/06/2016 7:50 AM Performed by: [name] Pre-anesthesia Checklist: Patient identified, Emergency Drugs available, Suction available, Patient being monitored and Timeout performed Patient Re-evaluated:Patient Re-evaluated prior to inductionOxygen Delivery Method: Simple face mask Preoxygenation: Pre-oxygenation with 100% oxygen Intubation Type: IV induction Placement Confirmation: positive ETCO2 Dental Injury: Teeth and Oropharynx as per pre-operative assessment     Anesthesia Regional Block:  Adductor canal block  Pre-Anesthetic Checklist: ,, timeout performed, Correct Patient, Correct Site, Correct Laterality, Correct Procedure, Correct Position, site marked, Risks and benefits discussed,  Surgical consent,  Pre-op evaluation,  At surgeon's request and post-op pain management  Laterality: Right  Prep: chloraprep       Needles:  Injection technique: Single-shot  Needle Type: Echogenic Stimulator Needle     Needle Length: 9cm 9 cm Needle Gauge: 21 and 21 G    Additional Needles:  Procedures: ultrasound guided (picture in chart) Adductor canal block Narrative:  Start time: 04/06/2016 10:20 AM End time: 04/06/2016 10:30 AM Injection made incrementally with  aspirations every 5 mL.  Performed by: Personally  Anesthesiologist: Lillia Abed  Additional Notes: Monitors applied. Patient sedated. Sterile prep and drape,hand hygiene and sterile gloves were used. Relevant anatomy identified.Needle position confirmed.Local anesthetic injected incrementally after negative aspiration. Local anesthetic spread visualized around nerve(s). Vascular puncture avoided. No complications. Image printed for medical record.The patient tolerated the procedure well.    Lillia Abed MD

## 2016-04-06 NOTE — Anesthesia Preprocedure Evaluation (Signed)
Anesthesia Evaluation  Patient identified by MRN, date of birth, ID band Patient awake    Reviewed: Allergy & Precautions, NPO status , Patient's Chart, lab work & pertinent test results  Airway Mallampati: II  TM Distance: >3 FB Neck ROM: Full    Dental   Pulmonary former smoker,    Pulmonary exam normal        Cardiovascular hypertension, Pt. on medications Normal cardiovascular exam     Neuro/Psych Depression CVA    GI/Hepatic GERD  Medicated and Controlled,  Endo/Other  diabetes, Type 2, Oral Hypoglycemic Agents  Renal/GU      Musculoskeletal   Abdominal   Peds  Hematology   Anesthesia Other Findings   Reproductive/Obstetrics                             Anesthesia Physical Anesthesia Plan  ASA: III  Anesthesia Plan: Spinal   Post-op Pain Management:  Regional for Post-op pain   Induction: Intravenous  Airway Management Planned: Simple Face Mask  Additional Equipment:   Intra-op Plan:   Post-operative Plan:   Informed Consent: I have reviewed the patients History and Physical, chart, labs and discussed the procedure including the risks, benefits and alternatives for the proposed anesthesia with the patient or authorized representative who has indicated his/her understanding and acceptance.     Plan Discussed with: CRNA and Surgeon  Anesthesia Plan Comments:         Anesthesia Quick Evaluation

## 2016-04-06 NOTE — Interval H&P Note (Signed)
History and Physical Interval Note:  04/06/2016 7:28 AM  Willie Pineda  has presented today for surgery, with the diagnosis of OA RIGHT UNI  The various methods of treatment have been discussed with the patient and family. After consideration of risks, benefits and other options for treatment, the patient has consented to  Procedure(s): UNICOMPARTMENTAL KNEE (Right) as a surgical intervention .  The patient's history has been reviewed, patient examined, no change in status, stable for surgery.  I have reviewed the patient's chart and labs.  Questions were answered to the patient's satisfaction.     Tymel Conely D

## 2016-04-06 NOTE — Progress Notes (Signed)
Utilization review completed.  

## 2016-04-06 NOTE — Op Note (Signed)
04/06/2016  9:50 AM  PATIENT:  Willie Pineda    PRE-OPERATIVE DIAGNOSIS:  OA RIGHT UNI  POST-OPERATIVE DIAGNOSIS:  Same  PROCEDURE:  UNICOMPARTMENTAL KNEE  SURGEON:  MURPHY, TIMOTHY D, MD  PHYSICIAN ASSISTANT: Roxan Hockey, PA-C, She was present and scrubbed throughout the case, critical for completion in a timely fashion, and for retraction, instrumentation, and closure.   ANESTHESIA:   General  PREOPERATIVE INDICATIONS:  Willie Pineda is a  70 y.o. male with a diagnosis of OA RIGHT UNI who failed conservative measures and elected for surgical management.    The risks benefits and alternatives were discussed with the patient preoperatively including but not limited to the risks of infection, bleeding, nerve injury, cardiopulmonary complications, blood clots, the need for revision surgery, among others, and the patient was willing to proceed.  OPERATIVE IMPLANTS: Biomet Oxford mobile bearing medial compartment arthroplasty. Femoral Component: large. Tibial tray: D, Size 3 poly.   OPERATIVE FINDINGS: Endstage grade 4 medial compartment osteoarthritis. No significant changes in the lateral or patellofemoral joint  OPERATIVE PROCEDURE: The patient was brought to the operating room placed in supine position. General anesthesia was administered. IV antibiotics were given. The lower extremity was placed in the legholder and prepped and draped in usual sterile fashion.  Time out was performed.  The leg was elevated and exsanguinated and the tourniquet was inflated. Anteromedial incision was performed, and I took care to preserve the MCL. Parapatellar incision was carried out, and the osteophytes were excised, along with the medial meniscus and a small portion of the fat pad.  The extra medullary tibial cutting jig was applied, using the spoon and the 46mm G-Clamp, and I took care to protect the anterior cruciate ligament insertion and the tibial spine. The medial collateral ligament was  also protected, and I resected my proximal tibia, matching the anatomic slope.   The proximal tibial bony cut was removed in one piece, and I turned my attention to the femur.  The intramedullary femoral rod was placed using the drill, and then using the appropriate reference, I assembled the femoral jig, setting my posterior cutting block. I resected my posterior femur, and then measured my gap.   I then used the mill to match the extension gap to the flexion gap. The gaps were then measured again with the appropriate feeler gauges. Once I had balanced flexion and extension gaps, I then completed the preparation of the femur.  I milled off the anterior aspect of the distal femur to prevent impingement. I also exposed the tibia, and selected the above-named component, and then used the cutting jig to prepare the keel slot on the tibia. I also used the awl to curette out the bone to complete the preparation of the keel. The back wall was intact.  I then placed trial components, and it was found to have excellent motion, and appropriate balance.  I then cemented the components into place, cementing the tibia first, removing all excess cement, and then cementing the femur.  All loose cement was removed.  The real polyethylene insert was applied manually, and the knee was taken through functional range of motion, and found to have excellent stability and restoration of joint motion, with excellent balance.  The wounds were irrigated copiously, and the parapatellar tissue closed with Vicryl, followed by Vicryl for the subcutaneous tissue, with routine closure with Steri-Strips and sterile gauze.  The tourniquet was released, and the patient was awakened and extubated and returned to PACU in  stable and satisfactory condition. There were no complications.  POSTOPERATIVE PLAN: DVT px will consist of SCD's and Xarelto, WBAT     Renette Butters, MD  This note was generated using a template and  dragon dictation system. In light of that, I have reviewed the note and all aspects of it are applicable to this case. Any dictation errors are due to the computerized dictation system.

## 2016-04-06 NOTE — Progress Notes (Signed)
Orthopedic Tech Progress Note Patient Details:  [name] 1946-10-11 [id number]  CPM Right Knee CPM Right Knee: On Right Knee Flexion (Degrees): 90 Right Knee Extension (Degrees): 0 Additional Comments:  (CPM off while pt up in recliner)   [name] 04/06/2016, 7:02 PM

## 2016-04-06 NOTE — H&P (View-Only) (Signed)
TOTAL KNEE ADMISSION H&P  Patient is being admitted for right total knee arthroplasty, unicompartmental vs total.  Subjective:  Chief Complaint:right knee pain.  HPI: Willie Pineda, 70 y.o. male, has a history of pain and functional disability in the right knee due to arthritis and has failed non-surgical conservative treatments for greater than 12 weeks to includeNSAID's and/or analgesics, corticosteriod injections, flexibility and strengthening excercises, use of assistive devices and activity modification.  Onset of symptoms was gradual, starting 5 years ago with gradually worsening course since that time. The patient noted no past surgery on the right knee(s).  Patient currently rates pain in the right knee(s) at 2 out of 10 with activity. Patient has night pain, worsening of pain with activity and weight bearing, pain that interferes with activities of daily living and crepitus.  Patient has evidence of joint space narrowing of medial compartment by imaging studies. This patient has had left total knee. There is no active infection.  There are no active problems to display for this patient.  No past medical history on file.  No past surgical history on file.   (Not in a hospital admission) Allergies not on file  Social History  Substance Use Topics  . Smoking status: Not on file  . Smokeless tobacco: Not on file  . Alcohol Use: Not on file    No family history on file.   Review of Systems  Cardiovascular: Positive for palpitations.  Musculoskeletal: Positive for joint pain.  Neurological: Positive for sensory change.    Objective:  Physical Exam  Constitutional: He is oriented to person, place, and time. He appears well-developed and well-nourished.  HENT:  Head: Normocephalic and atraumatic.  Eyes: EOM are normal. Pupils are equal, round, and reactive to light.  Neck: Normal range of motion. Neck supple.  Cardiovascular: Normal rate.  An irregularly irregular rhythm  present.  Occasional extrasystoles are present.  Occasional missed beats-irregular  Respiratory: Effort normal and breath sounds normal.  GI: Soft. Bowel sounds are normal.  Musculoskeletal:       Right knee: He exhibits deformity. Tenderness found. Medial joint line tenderness noted.  Left knee well healed scar from tka  Neurological: He is alert and oriented to person, place, and time.  Decreased light touch throughout bi LE's, R.L secondary to DM  Skin: Skin is warm and dry.  Psychiatric: He has a normal mood and affect. His behavior is normal.    Vital signs in last 24 hours: @VSRANGES @  Labs:   There is no height or weight on file to calculate BMI.   Imaging Review Plain radiographs demonstrate severe degenerative joint disease of the right knee(s). The overall alignment issignificant varus. The bone quality appears to be adequate for age and reported activity level.  Assessment/Plan:  End stage arthritis, right knee   The patient history, physical examination, clinical judgment of the provider and imaging studies are consistent with end stage degenerative joint disease of the right knee(s) and total knee arthroplasty is deemed medically necessary. The treatment options including medical management, injection therapy arthroscopy and arthroplasty were discussed at length. The risks and benefits of total knee arthroplasty were presented and reviewed. The risks due to aseptic loosening, infection, stiffness, patella tracking problems, thromboembolic complications and other imponderables were discussed. The patient acknowledged the explanation, agreed to proceed with the plan and consent was signed. Patient is being admitted for inpatient treatment for surgery, pain control, PT, OT, prophylactic antibiotics, VTE prophylaxis, progressive ambulation and ADL's and discharge  planning. The patient is planning to be discharged home with home health services

## 2016-04-06 NOTE — Progress Notes (Signed)
Orthopedic Tech Progress Note Patient Details:  Willie Pineda Apr 05, 1946 [id number]  CPM Right Knee CPM Right Knee: On Right Knee Flexion (Degrees): 90 Right Knee Extension (Degrees): 0 Additional Comments: Trapeze bar and foot roll   [name] 04/06/2016, 11:22 AM

## 2016-04-06 NOTE — Anesthesia Postprocedure Evaluation (Signed)
Anesthesia Post Note  Patient: Willie Pineda  Procedure(s) Performed: Procedure(s) (LRB): UNICOMPARTMENTAL RIGHT KNEE (Right)  Patient location during evaluation: PACU Anesthesia Type: General Level of consciousness: awake and alert Pain management: pain level controlled Vital Signs Assessment: post-procedure vital signs reviewed and stable Respiratory status: spontaneous breathing, nonlabored ventilation and respiratory function stable Cardiovascular status: blood pressure returned to baseline and stable Postop Assessment: no signs of nausea or vomiting Anesthetic complications: no    Last Vitals:  Filed Vitals:   04/06/16 1345 04/06/16 1401  BP: 140/88 162/98  Pulse: 64   Temp: 36.7 C 36.7 C  Resp: 22 16    Last Pain:  Filed Vitals:   04/06/16 1426  PainSc: 6                  Kyel Purk A

## 2016-04-06 NOTE — Transfer of Care (Signed)
Immediate Anesthesia Transfer of Care Note  Patient: Willie Pineda  Procedure(s) Performed: Procedure(s): UNICOMPARTMENTAL RIGHT KNEE (Right)  Patient Location: PACU  Anesthesia Type:MAC combined with regional for post-op pain  Level of Consciousness: awake and alert   Airway & Oxygen Therapy: Patient Spontanous Breathing and Patient connected to face mask oxygen  Post-op Assessment: Report given to RN and Post -op Vital signs reviewed and stable  Post vital signs: Reviewed and stable  Last Vitals:  Filed Vitals:   04/06/16 0659 04/06/16 0702  BP: 166/111 143/95  Pulse: 82   Temp: 36.9 C   Resp: 20     Last Pain:  Filed Vitals:   04/06/16 1044  PainSc: 6       Patients Stated Pain Goal: 2 ([id number])  Complications: No apparent anesthesia complications

## 2016-04-06 NOTE — Anesthesia Postprocedure Evaluation (Signed)
Anesthesia Post Note  Patient: Willie Pineda  Procedure(s) Performed: Procedure(s) (LRB): UNICOMPARTMENTAL RIGHT KNEE (Right)  Patient location during evaluation: PACU Anesthesia Type: Spinal Level of consciousness: oriented and awake and alert Pain management: pain level controlled Vital Signs Assessment: post-procedure vital signs reviewed and stable Respiratory status: spontaneous breathing, respiratory function stable and patient connected to nasal cannula oxygen Cardiovascular status: blood pressure returned to baseline and stable Postop Assessment: no headache and no backache Anesthetic complications: no    Last Vitals:  Filed Vitals:   04/06/16 1345 04/06/16 1401  BP: 140/88 162/98  Pulse: 64   Temp: 36.7 C 36.7 C  Resp: 22 16    Last Pain:  Filed Vitals:   04/06/16 1426  PainSc: 6                  Zayli Villafuerte DAVID

## 2016-04-06 NOTE — Evaluation (Signed)
Physical Therapy Evaluation Patient Details Name: Willie Pineda MRN: [id number] DOB: 28-Jun-1946 Today's Date: 04/06/2016   History of Present Illness  Patient is a 70 y/o male admitted for R unicompartmental knee arthroplasy due to OA.  Previous L TKA, former smoker, GERD, HLD, HTN, afib/PAF, non-ischemic cardiomyopathy (by cardiology notes; felt to be tachycardia-induced), CHF, peripheral neuropathy, depression, glaucoma, DM2 (borderline), BPH, RLS, COPD, CVA X 3   Clinical Impression  Patient presents with decreased independence with mobility due to deficits listed in PT problem list below.  He will benefit from skilled PT in the acute setting to allow return home with spouse assist and follow up outpatient PT.    Follow Up Recommendations Outpatient PT    Equipment Recommendations  None recommended by PT    Recommendations for Other Services       Precautions / Restrictions Precautions Precautions: Fall      Mobility  Bed Mobility Overal bed mobility: Needs Assistance Bed Mobility: Sidelying to Sit   Sidelying to sit: Min guard       General bed mobility comments: cues for technique, assist for safety  Transfers Overall transfer level: Needs assistance Equipment used: Rolling walker (2 wheeled) Transfers: Sit to/from Stand Sit to Stand: Min assist;Min guard         General transfer comment: pulls up on walker despite cues, cues to reach back to arms on chair  Ambulation/Gait Ambulation/Gait assistance: Min guard Ambulation Distance (Feet): 40 Feet Assistive device: Rolling walker (2 wheeled) Gait Pattern/deviations: Step-through pattern;Decreased stride length;Antalgic     General Gait Details: slow on turns but working to determine safest turn due to IV without cues  Stairs            Wheelchair Mobility    Modified Rankin (Stroke Patients Only)       Balance Overall balance assessment: No apparent balance deficits (not formally assessed)                                            Pertinent Vitals/Pain Pain Assessment: 0-10 Pain Score: 5  Pain Location: R knee, 6/10 in back Pain Descriptors / Indicators: Aching;Operative site guarding Pain Intervention(s): Monitored during session;Repositioned;Heat applied;Ice applied;Limited activity within patient's tolerance    Home Living Family/patient expects to be discharged to:: Private residence Living Arrangements: Spouse/significant other Available Help at Discharge: Family;Available 24 hours/day Type of Home: House Home Access: Level entry     Home Layout: One level Home Equipment: Walker - 2 wheels;Bedside commode;Shower seat      Prior Function Level of Independence: Independent               Hand Dominance        Extremity/Trunk Assessment               Lower Extremity Assessment: RLE deficits/detail RLE Deficits / Details: AROM knee flexion 55 degrees, lifts antigravity       Communication   Communication: No difficulties  Cognition Arousal/Alertness: Awake/alert Behavior During Therapy: WFL for tasks assessed/performed Overall Cognitive Status: Within Functional Limits for tasks assessed                      General Comments      Exercises Total Joint Exercises Ankle Circles/Pumps: AROM;Both;10 reps;Seated Quad Sets: AROM;Right;5 reps Heel Slides: AAROM;Right;10 reps;Seated Hip ABduction/ADduction: AROM;Right;Seated  Assessment/Plan    PT Assessment Patient needs continued PT services  PT Diagnosis Difficulty walking;Acute pain   PT Problem List Decreased range of motion;Decreased strength;Decreased activity tolerance;Decreased balance;Pain;Decreased mobility;Decreased knowledge of use of DME;Decreased knowledge of precautions  PT Treatment Interventions DME instruction;Gait training;Balance training;Functional mobility training;Patient/family education;Therapeutic activities;Therapeutic exercise    PT Goals (Current goals can be found in the Care Plan section) Acute Rehab PT Goals Patient Stated Goal: To go home tomorrow PT Goal Formulation: With patient/family Time For Goal Achievement: 04/09/16 Potential to Achieve Goals: Good    Frequency Min 6X/week   Barriers to discharge        Co-evaluation               End of Session Equipment Utilized During Treatment: Gait belt Activity Tolerance: Patient tolerated treatment well Patient left: in chair;with call bell/phone within reach;with family/visitor present           Time: [id number] PT Time Calculation (min) (ACUTE ONLY): 30 min   Charges:   PT Evaluation $PT Eval Moderate Complexity: 1 Procedure PT Treatments $Gait Training: 8-22 mins   PT G CodesReginia Naas 2016-04-08, 4:56 PM  Magda Kiel, [place] 2016/04/08

## 2016-04-07 ENCOUNTER — Encounter (HOSPITAL_COMMUNITY): Payer: Self-pay | Admitting: Orthopedic Surgery

## 2016-04-07 ENCOUNTER — Inpatient Hospital Stay (HOSPITAL_COMMUNITY): Payer: Medicare Other

## 2016-04-07 MED ORDER — DOCUSATE SODIUM 100 MG PO CAPS: 100.0000 mg | ORAL_CAPSULE | Freq: Two times a day (BID) | ORAL | Status: DC

## 2016-04-07 MED ORDER — METHOCARBAMOL 500 MG PO TABS: 500.0000 mg | ORAL_TABLET | Freq: Four times a day (QID) | ORAL | Status: DC | PRN

## 2016-04-07 MED ORDER — OXYCODONE-ACETAMINOPHEN 5-325 MG PO TABS: 2.0000 | ORAL_TABLET | ORAL | Status: DC | PRN

## 2016-04-07 MED ORDER — ONDANSETRON HCL 4 MG PO TABS: 4.0000 mg | ORAL_TABLET | Freq: Three times a day (TID) | ORAL | Status: DC | PRN

## 2016-04-07 NOTE — Discharge Summary (Signed)
Physician Discharge Summary  Patient ID: Willie Pineda MRN: [id number] DOB/AGE: 1946/09/13 70 y.o.  Admit date: 04/06/2016 Discharge date: 04/07/2016  Admission Diagnoses:  <principal problem not specified>  Discharge Diagnoses:  Active Problems:   Knee osteoarthritis   Past Medical History  Diagnosis Date  . Hyperlipidemia     takes Atorvastatin daily  . GERD (gastroesophageal reflux disease)     takes Omeprazole daily  . Depression     takes Zoloft daily  . Hypertension     takes Metoprolol,HCTZ,and Losartan daily.Takes Clonidine if needed  . Peripheral neuropathy (HCC)     takes Gabapentin daily  . Dysrhythmia     A Fib-takes Xarelto daily  . CHF (congestive heart failure) (HCC)     takes Lasix daily  . 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 Stroke [facility]) 4 1/2 yrs ago  . COPD (chronic obstructive pulmonary disease) (HCC)     inhalers as needed  . Pneumonia     hx of  . History of bronchitis   . Restless leg   . Arthritis   . Back pain     age related  . Nocturia   . Enlarged prostate     benign  . Diabetes mellitus without complication ([place])     borderline but doesn't take any meds  . Glaucoma     mild  . Complication of anesthesia     bronchial spasms for about 6 wks after being intubated    Surgeries: Procedure(s): UNICOMPARTMENTAL RIGHT KNEE on 04/06/2016   Consultants (if any):    Discharged Condition: Improved  Hospital Course: Willie Pineda is an 70 y.o. male who was admitted 04/06/2016 with a diagnosis of <principal problem not specified> and went to the operating room on 04/06/2016 and underwent the above named procedures.    He was given perioperative antibiotics:  Anti-infectives    Start     Dose/Rate Route Frequency Ordered Stop   04/06/16 1600  clindamycin (CLEOCIN) IVPB 600 mg     600 mg 100 mL/hr over 30 Minutes Intravenous Every 6 hours 04/06/16 1412 04/07/16 0359   04/06/16 0644  clindamycin  (CLEOCIN) 900 MG/50ML IVPB    Comments:  Nyoka Cowden   : cabinet override      04/06/16 0644 04/06/16 0758   04/06/16 0641  clindamycin (CLEOCIN) IVPB 900 mg  Status:  Discontinued     900 mg 100 mL/hr over 30 Minutes Intravenous On call to O.R. 04/06/16 [id number] 04/06/16 1400    .  He was given sequential compression devices, early ambulation, and xarelto for DVT prophylaxis.  He benefited maximally from the hospital stay and there were no complications.    Recent vital signs:  Filed Vitals:   04/06/16 2300 04/07/16 0500  BP: 150/96 132/86  Pulse: 85 60  Temp: 98.1 F (36.7 C) 98.7 F (37.1 C)  Resp: 16 17    Recent laboratory studies:  Lab Results  Component Value Date   HGB 15.5 03/25/2016   Lab Results  Component Value Date   WBC 9.3 03/25/2016   PLT 224 03/25/2016   Lab Results  Component Value Date   INR 1.13 04/06/2016   Lab Results  Component Value Date   NA 140 03/25/2016   K 4.2 03/25/2016   CL 101 03/25/2016   CO2 27 03/25/2016   BUN 13 03/25/2016   CREATININE 1.03 03/25/2016   GLUCOSE 150* 03/25/2016  Discharge Medications:     Medication List    TAKE these medications        albuterol 108 (90 Base) MCG/ACT inhaler  Commonly known as:  PROVENTIL HFA;VENTOLIN HFA  Inhale 1-2 puffs into the lungs every 4 (four) hours as needed for wheezing or shortness of breath.     amiodarone 200 MG tablet  Commonly known as:  PACERONE  Take 200 mg by mouth daily.     atorvastatin 20 MG tablet  Commonly known as:  LIPITOR  Take 20 mg by mouth daily.     cloNIDine 0.1 MG tablet  Commonly known as:  CATAPRES  Take 0.1 mg by mouth daily as needed (Only takes if pt if bp  > 160).     docusate sodium 100 MG capsule  Commonly known as:  COLACE  Take 1 capsule (100 mg total) by mouth 2 (two) times daily. Continue this while taking narcotics to help with bowel movements     furosemide 80 MG tablet  Commonly known as:  LASIX  Take 80 mg by mouth 2  (two) times daily.     gabapentin 300 MG capsule  Commonly known as:  NEURONTIN  Take 600 mg by mouth at bedtime.     hydrochlorothiazide 25 MG tablet  Commonly known as:  HYDRODIURIL  Take 25 mg by mouth daily.     latanoprost 0.005 % ophthalmic solution  Commonly known as:  XALATAN  Place 1 drop into both eyes at bedtime.     losartan 100 MG tablet  Commonly known as:  COZAAR  Take 100 mg by mouth daily.     methocarbamol 500 MG tablet  Commonly known as:  ROBAXIN  Take 1 tablet (500 mg total) by mouth every 6 (six) hours as needed.     metoprolol succinate 50 MG 24 hr tablet  Commonly known as:  TOPROL-XL  Take 50 mg by mouth daily. Take with or immediately following a meal.     omeprazole 20 MG capsule  Commonly known as:  PRILOSEC  Take 20 mg by mouth daily.     ondansetron 4 MG tablet  Commonly known as:  ZOFRAN  Take 1 tablet (4 mg total) by mouth every 8 (eight) hours as needed for nausea.     oxyCODONE-acetaminophen 5-325 MG tablet  Commonly known as:  ROXICET  Take 2 tablets by mouth every 4 (four) hours as needed.     PRESERVISION AREDS 2 PO  Take 1 capsule by mouth 2 (two) times daily.     rivaroxaban 20 MG Tabs tablet  Commonly known as:  XARELTO  Take 20 mg by mouth daily with supper.     sertraline 100 MG tablet  Commonly known as:  ZOLOFT  Take 100 mg by mouth daily.     tiotropium 18 MCG inhalation capsule  Commonly known as:  SPIRIVA  Place 18 mcg into inhaler and inhale 2 (two) times daily.        Diagnostic Studies: No results found.  Disposition: Final discharge disposition not confirmed        Follow-up Information    Follow up with Muskan Bolla D, MD In 2 weeks.   Specialty:  Orthopedic Surgery   Contact information:   [address] [place] [address] [phone]       Follow up with Renette Butters, MD.   Specialty:  Orthopedic Surgery   Contact information:   [address] [place] [address] [phone]        Signed: Renette Butters 04/07/2016, 8:33 AM

## 2016-04-07 NOTE — Progress Notes (Signed)
Orthopedic Tech Progress Note Patient Details:  Willie Pineda 10/01/1946 [id number] Pt. refused morning CPM. Patient ID: Willie Pineda, male   DOB: August 06, 1946, 70 y.o.   MRN: [id number]   [name] 04/07/2016, 6:39 AM

## 2016-04-07 NOTE — Discharge Instructions (Signed)

## 2016-04-07 NOTE — Progress Notes (Signed)
Patient seen on rounds. Discharge plan is to return to home with wife and go to [place]. He prefers no home health. His OPPT appointment is scheduled for Friday. His follow up appointment with Dr. Percell Miller is 04/21/16 @ 930. I have ordered a standard rolling walker for him from [facility] to be delivered to the room prior to discharge. He will have Xarelto for anticoagulation and he has been on this pre-op.   Ladell Heads, RN BPCI CM  [place]

## 2016-04-07 NOTE — Progress Notes (Signed)
Pt ready for discharge. Wife present when discharge instructions and meds reviewed. Rx with pt. Walker home with pt. IV discontinued with angiocath in tact. Site WNL.  Pt displays no s/sx of distress or c/o. All personal belongings with pt.

## 2016-04-07 NOTE — Care Management Note (Signed)
Case Management Note  Patient Details  Name: HARY KIDO MRN: [id number] Date of Birth: [birth year]  Subjective/Objective:            S/p unicompartmental knee replacement        Action/Plan: Outpatient PT appointment schedule by MD office.Spoke with patient and wife, he will need rolling walker but not 3N1. Wife will be assisting him after discharge. Contacted [facility] for rolling walker to be delivered to patient's room.   Expected Discharge Date:                  Expected Discharge Plan:  Home/Self Care  In-House Referral:  NA  Discharge planning Services  CM Consult  Post Acute Care Choice:  Durable Medical Equipment Choice offered to:     DME Arranged:  Walker rolling DME Agency:  [place]:  NA [place] Agency:     Status of Service:  Completed, signed off  Medicare Important Message Given:    Date Medicare IM Given:    Medicare IM give by:    Date Additional Medicare IM Given:    Additional Medicare Important Message give by:     If discussed at [place] of Stay Meetings, dates discussed:    Additional Comments:  Nila Nephew, RN 04/07/2016, 11:46 AM

## 2016-04-07 NOTE — Progress Notes (Signed)
Physical Therapy Treatment Patient Details Name: Willie Pineda MRN: [id number] DOB: 1946-04-04 Today's Date: 04/07/2016    History of Present Illness Patient is a 70 y/o male admitted for R unicompartmental knee arthroplasy due to OA.  Previous L TKA, former smoker, GERD, HLD, HTN, afib/PAF, non-ischemic cardiomyopathy (by cardiology notes; felt to be tachycardia-induced), CHF, peripheral neuropathy, depression, glaucoma, DM2 (borderline), BPH, RLS, COPD, CVA X 3     PT Comments    Patient is making good progress with PT.  From a mobility standpoint anticipate patient will be ready for DC home with spouse to assist. Pt and spouse deny any questions or concerns following session.   Follow Up Recommendations  Outpatient PT     Equipment Recommendations  None recommended by PT    Recommendations for Other Services       Precautions / Restrictions Precautions Precautions: Knee Precaution Booklet Issued: Yes (comment) Precaution Comments: HEP provided, reviewed knee extension precautions Restrictions Weight Bearing Restrictions: Yes RLE Weight Bearing: Weight bearing as tolerated    Mobility  Bed Mobility               General bed mobility comments: up in chair upon arrival  Transfers Overall transfer level: Needs assistance Equipment used: Rolling walker (2 wheeled) Transfers: Sit to/from Stand Sit to Stand: Supervision         General transfer comment: safe hand position  Ambulation/Gait Ambulation/Gait assistance: Supervision Ambulation Distance (Feet): 170 Feet Assistive device: Rolling walker (2 wheeled) Gait Pattern/deviations: Step-through pattern;Decreased weight shift to right Gait velocity: decreased   General Gait Details: steady pattern, no loss of balance.   Stairs            Wheelchair Mobility    Modified Rankin (Stroke Patients Only)       Balance Overall balance assessment: No apparent balance deficits (not formally  assessed)                                  Cognition Arousal/Alertness: Awake/alert Behavior During Therapy: WFL for tasks assessed/performed Overall Cognitive Status: Within Functional Limits for tasks assessed                      Exercises Total Joint Exercises Ankle Circles/Pumps: AROM;Both;10 reps Quad Sets: Strengthening;Right;10 reps Short Arc Quad: Strengthening;Right;10 reps Heel Slides: AAROM;Right;10 reps Hip ABduction/ADduction: Strengthening;Right;10 reps Straight Leg Raises: Strengthening;Right;5 reps Long Arc Quad: Strengthening;Right;10 reps Knee Flexion: AROM;Right;10 reps Goniometric ROM: 84 degrees flexion    General Comments        Pertinent Vitals/Pain Pain Assessment: 0-10 Pain Score: 4  Pain Location: Rt knee Pain Descriptors / Indicators: Aching Pain Intervention(s): Limited activity within patient's tolerance;Monitored during session    Home Living                      Prior Function            PT Goals (current goals can now be found in the care plan section) Acute Rehab PT Goals Patient Stated Goal: To go home tomorrow PT Goal Formulation: With patient/family Time For Goal Achievement: 04/09/16 Potential to Achieve Goals: Good Progress towards PT goals: Progressing toward goals    Frequency  7X/week    PT Plan Current plan remains appropriate    Co-evaluation             End of Session Equipment Utilized During Treatment:  Gait belt Activity Tolerance: Patient tolerated treatment well Patient left: in chair;with call bell/phone within reach;with family/visitor present (in knee extension)     Time: [id number] PT Time Calculation (min) (ACUTE ONLY): 38 min  Charges:  $Gait Training: 8-22 mins $Therapeutic Exercise: 23-37 mins                    G Codes:      Cassell Clement, PT, CSCS Pager [phone] Office [phone]  04/07/2016, 4:28 PM

## 2016-04-09 ENCOUNTER — Encounter: Payer: Self-pay | Admitting: Rehabilitative and Restorative Service Providers"

## 2016-04-09 ENCOUNTER — Ambulatory Visit (INDEPENDENT_AMBULATORY_CARE_PROVIDER_SITE_OTHER): Payer: Medicare Other | Admitting: Rehabilitative and Restorative Service Providers"

## 2016-04-09 DIAGNOSIS — R269 Unspecified abnormalities of gait and mobility: Secondary | ICD-10-CM | POA: Diagnosis not present

## 2016-04-09 DIAGNOSIS — M25561 Pain in right knee: Secondary | ICD-10-CM | POA: Diagnosis not present

## 2016-04-09 DIAGNOSIS — R29898 Other symptoms and signs involving the musculoskeletal system: Secondary | ICD-10-CM

## 2016-04-09 NOTE — Patient Instructions (Addendum)
HIP: Hamstrings - Supine    Place strap around foot. Raise leg up, keep knee straight. Hold __30_ seconds. _3__ reps per set, _3-4__ sets per day    Outer Hip Stretch: Reclined IT Band Stretch (Strap)    Strap around opposite foot, pull across only as far as possible with shoulders on mat. Hold for ___30 seconds. Repeat __3__ times each leg.  Quad Sets    Slowly tighten thigh muscles of straight, left leg while counting out loud to __10 sec __. Relax. Repeat __10__ times. Do _2-3___ sessions per day.   HIP: Flexion / KNEE: Extension, Straight Leg Raise   Quad set first and then  Raise leg about 10-12 inches, keeping knee straight.  Hold 5-10 sec Perform slowly. _10__ reps per set, __1-2_ sets per day  Bracing With Heel Slides (Supine)    With neutral spine, tighten pelvic floor and abdominals and hold. slide heel toward bottom.  Hold 10 sec Repeat __10_ times. Do __2-3 _ times a day.  Sitting with knee bent - scoot out on the edge of the chair until you feel you have a good stretch  Hold 20-30 sec  3-5 reps    TENS UNIT: This is helpful for muscle pain and spasm.   Search and Purchase a TENS 7000 2nd edition at www.tenspros.com. It should be less than $30.     TENS unit instructions: Do not shower or bathe with the unit on Turn the unit off before removing electrodes or batteries If the electrodes lose stickiness add a drop of water to the electrodes after they are disconnected from the unit and place on plastic sheet. If you continued to have difficulty, call the TENS unit company to purchase more electrodes. Do not apply lotion on the skin area prior to use. Make sure the skin is clean and dry as this will help prolong the life of the electrodes. After use, always check skin for unusual red areas, rash or other skin difficulties. If there are any skin problems, does not apply electrodes to the same area. Never remove the electrodes from the unit by pulling the  wires. Do not use the TENS unit or electrodes other than as directed. Do not change electrode placement without consultating your therapist or physician. Keep 2 fingers with between each electrode.   Stretch out strap - green strap for stretching ~$12 or so

## 2016-04-09 NOTE — Therapy (Signed)
[place], [name], [address] Phone: [phone]   Fax:  [phone]  Physical Therapy Evaluation  Patient Details  Name: Willie Pineda MRN: [id number] Date of Birth: [birth year] Referring Provider: Dr. Fredonia Highland   Encounter Date: 04/09/2016      PT End of Session - 04/09/16 1102    Visit Number 1   Number of Visits 12   Date for PT Re-Evaluation 05/21/16   PT Start Time 1102   PT Stop Time 1159   PT Time Calculation (min) 57 min   Activity Tolerance Patient tolerated treatment well      Past Medical History  Diagnosis Date  . Hyperlipidemia     takes Atorvastatin daily  . GERD (gastroesophageal reflux disease)     takes Omeprazole daily  . Depression     takes Zoloft daily  . Hypertension     takes Metoprolol,HCTZ,and Losartan daily.Takes Clonidine if needed  . Peripheral neuropathy (HCC)     takes Gabapentin daily  . Dysrhythmia     A Fib-takes Xarelto daily  . CHF (congestive heart failure) (HCC)     takes Lasix daily  . 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 Stroke [facility]) 4 1/2 yrs ago  . COPD (chronic obstructive pulmonary disease) (HCC)     inhalers as needed  . Pneumonia     hx of  . History of bronchitis   . Restless leg   . Arthritis   . Back pain     age related  . Nocturia   . Enlarged prostate     benign  . Diabetes mellitus without complication ([place])     borderline but doesn't take any meds  . Glaucoma     mild  . Complication of anesthesia     bronchial spasms for about 6 wks after being intubated    Past Surgical History  Procedure Laterality Date  . Cholecystectomy  1995  . Eye surgery Bilateral     cataracts  . Right ulnar surgery    . Joint replacement  2008    left TKA  . Partial knee arthroplasty Right 04/06/2016    Procedure: UNICOMPARTMENTAL RIGHT KNEE;  Surgeon: Renette Butters, MD;  Location: [facility];   Service: Orthopedics;  Laterality: Right;    There were no vitals filed for this visit.       Subjective Assessment - 04/09/16 1102    Subjective Willie Pineda reports that he has had OA in bilat knees for years. He underwent Rt partial knee replacement 04/06/16.    Pertinent History Lt TKA 2009; OA; brain stem CVA 2012; HTN    How long can you sit comfortably? 30 min    How long can you stand comfortably? 30 min    How long can you walk comfortably? not at all    Diagnostic tests xrays    Patient Stated Goals full ROM in Rt knee - good strength - walk without limp    Currently in Pain? Yes   Pain Score 6    Pain Location Knee   Pain Orientation Right   Pain Descriptors / Indicators Aching;Sore   Pain Type Surgical pain;Acute pain   Pain Onset 1 to 4 weeks ago   Pain Frequency Intermittent   Aggravating Factors  up and down; prolonged positions    Pain Relieving Factors meds; ice; rest  [facility] PT Assessment - 04/09/16 0001    Assessment   Medical Diagnosis Rt partial knee replacement    Referring Provider Dr. Fredonia Highland    Onset Date/Surgical Date 04/06/16   Hand Dominance Right   Next MD Visit 04/21/16   Prior Therapy PT in the hospital after surgery    Precautions   Precautions None   Restrictions   Weight Bearing Restrictions No   Balance Screen   Has the patient fallen in the past 6 months Yes   How many times? 2   Has the patient had a decrease in activity level because of a fear of falling?  No   Is the patient reluctant to leave their home because of a fear of falling?  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   Leisure sewimming daily - walking in the water  ~ 45-60 min; has a horse rides/cares for; some handyman tasks    Observation/Other Assessments   Focus on  Therapeutic Outcomes (FOTO)  43% limitation    Sensation   Additional Comments some bilat neuropathy bilat feet - borderline AODM    Posture/Postural Control   Posture Comments slightly flexed at hips; Rt knee flexed    AROM   Overall AROM Comments tight bilat hip ext    Right Knee Extension -8   Right Knee Flexion 108   Left Knee Extension 0   Left Knee Flexion 113   Strength   Overall Strength Comments Lt LE 5/5 except hip flexion/ext 5-/5; Rt LE 4+/5 thorughout    Strength Assessment Site --  Lt knee 5/5; Rt knee not tested d/t surgery 04/06/16   Flexibility   Hamstrings tight Rt ~ 60 deg; Lt 70 deg    Quadriceps knee flexion Rt 80 deg; Lt 95 deg   ITB tight bilat    Piriformis tight Rt > Lt    Ambulation/Gait   Gait Comments limp Rt LE with hip and knee flexed on Rt                    [facility] Adult PT Treatment/Exercise - 04/09/16 0001    Knee/Hip Exercises: Stretches   Passive Hamstring Stretch 3 reps;30 seconds   Knee: Self-Stretch to increase Flexion 5 reps;10 seconds   ITB Stretch 3 reps;30 seconds   Knee/Hip Exercises: Seated   Knee/Hip Flexion sitting at edge of chair- scoot forward to stretch hold 20 sec x 5    Knee/Hip Exercises: Supine   Quad Sets Right;10 reps  10 sec hold   Straight Leg Raises Right;10 reps  5 sec hold    Electrical Stimulation   Electrical Stimulation Location Rt knee    Electrical Stimulation Action ion repelling    Electrical Stimulation Parameters to tolerance   Electrical Stimulation Goals Edema;Pain   Vasopneumatic   Number Minutes Vasopneumatic  15 minutes   Vasopnuematic Location  Knee   Vasopneumatic Pressure Low   Vasopneumatic Temperature  3*                PT Education - 04/09/16 1138    Education provided Yes   Education Details HEP TENs unit    Person(s) Educated Patient   Methods Explanation;Demonstration;Tactile cues;Verbal cues;Handout   Comprehension Verbalized understanding;Returned  demonstration;Verbal cues required;Tactile cues required  PT Short Term Goals - 04/09/16 1318    PT SHORT TERM GOAL #1   Title Initiate HEP 04/23/16   Time 2   Period Weeks   Status New   PT SHORT TERM GOAL #2   Title Increase AROM Rt knee to 0 deg extension to 120 deg flexion 05/21/16   Time 6   Period Weeks   Status New   PT SHORT TERM GOAL #3   Title 5/5 strength Rt hip and knee 05/21/16   Time 6   Period Weeks   Status New   PT SHORT TERM GOAL #4   Title Improve gait pattern without assistive device 05/21/16   Time 6   Period Weeks   Status New           PT Long Term Goals - 04/09/16 1321    PT LONG TERM GOAL #1   Title Increase gait to 20-30 min with no pain 07/02/16   Time 12   Period Weeks   Status New   PT LONG TERM GOAL #2   Title 5/5 stength Rt LE 07/02/16   Time 12   Period Weeks   Status New   PT LONG TERM GOAL #3   Title Lt knee ROM 0 deg ext to 125-130 deg flexion 07/02/16   Time 12   Period Weeks   Status New   PT LONG TERM GOAL #4   Title Independent in HEP 07/02/16   Time 12   Period Weeks   Status New   PT LONG TERM GOAL #5   Title Improve FOTO to </= 37% limitation 07/02/16   Time 12   Period Weeks   Status New               Plan - 04/09/16 1304    Clinical Impression Statement Willie Pineda presents s/p Rt partial Rt knee replacement 04/06/16. He has poor posture and abnormal gait pattern; limited ROM and strength; pain; decreased functional activity level. Patient will benefit form PT to address limitatiions identified and progress patient to highest level of independent function.    Rehab Potential Good   PT Frequency 2x / week   PT Duration 12 weeks   PT Treatment/Interventions Patient/family education;ADLs/Self Care Home Management;Manual techniques;Dry needling;Neuromuscular re-education;Cryotherapy;Electrical Stimulation;Iontophoresis 4mg /ml Dexamethasone;Moist Heat;Ultrasound;Therapeutic activities;Therapeutic exercise   PT  Next Visit Plan Progress with ROM and strengthening Rt LE   PT Home Exercise Plan HEP   Consulted and Agree with Plan of Care Patient      Patient will benefit from skilled therapeutic intervention in order to improve the following deficits and impairments:  Postural dysfunction, Improper body mechanics, Abnormal gait, Pain, Decreased range of motion, Decreased mobility, Decreased strength, Decreased endurance, Decreased activity tolerance  Visit Diagnosis: Right knee pain - Plan: PT plan of care cert/re-cert  Abnormality of gait - Plan: PT plan of care cert/re-cert  Other symptoms and signs involving the musculoskeletal system - Plan: PT plan of care cert/re-cert     Problem List Patient Active Problem List   Diagnosis Date Noted  . Knee osteoarthritis 04/06/2016    Lynnann Knudsen Nilda Simmer PT, MPH  04/09/2016, 1:27 PM  [facility] [place], [name], [address] Phone: [phone]   Fax:  [phone]  Name: Willie Pineda MRN: [id number] Date of Birth: 1946-07-10

## 2016-04-13 ENCOUNTER — Encounter: Payer: Self-pay | Admitting: Rehabilitative and Restorative Service Providers"

## 2016-04-13 ENCOUNTER — Ambulatory Visit (INDEPENDENT_AMBULATORY_CARE_PROVIDER_SITE_OTHER): Payer: Medicare Other | Admitting: Rehabilitative and Restorative Service Providers"

## 2016-04-13 DIAGNOSIS — R29898 Other symptoms and signs involving the musculoskeletal system: Secondary | ICD-10-CM | POA: Diagnosis not present

## 2016-04-13 DIAGNOSIS — M25561 Pain in right knee: Secondary | ICD-10-CM

## 2016-04-13 DIAGNOSIS — R269 Unspecified abnormalities of gait and mobility: Secondary | ICD-10-CM

## 2016-04-13 NOTE — Patient Instructions (Signed)
Standing with left foot forward of right foot  Shift weight to left foot allowing right heel to raise up from the floor Then shift weight back to the right foot allowing left toe to lift  Repeat 50 times - 3-4 times a day    Strengthening: Hip Adduction - Isometric    Lying on back With ball or folded pillow between knees, squeeze knees together. Hold __10__ seconds. Repeat __10__ times per set. Do __1-3__ sets per session. Do _1-2___ sessions per day.    Strengthening: Hip Extension (Prone)    Tighten muscles on front of left thigh, then lift leg __10__ inches from surface, keeping knee locked. Repeat __10__ times per set. Do __1-3__ sets per session. Do __1-2__ sessions per day.    Strengthening: Hip Abduction (Side-Lying)    Tighten muscles on front of left thigh, then lift leg __10-12__ inches from surface, keeping knee locked.  Repeat __10__ times per set. Do _1-3___ sets per session. Do _1-2___ sessions per day.

## 2016-04-13 NOTE — Therapy (Signed)
[place], [name], [address] Phone: [phone]   Fax:  [phone]  Physical Therapy Treatment  Patient Details  Name: Willie Pineda MRN: [id number] Date of Birth: [birth year] Referring Provider: Dr. Fredonia Highland  Encounter Date: 04/13/2016      PT End of Session - 04/13/16 1240    Visit Number 2   Number of Visits 12   Date for PT Re-Evaluation 05/21/16   PT Start Time 1147   PT Stop Time 1245   PT Time Calculation (min) 58 min   Activity Tolerance Patient tolerated treatment well      Past Medical History  Diagnosis Date  . Hyperlipidemia     takes Atorvastatin daily  . GERD (gastroesophageal reflux disease)     takes Omeprazole daily  . Depression     takes Zoloft daily  . Hypertension     takes Metoprolol,HCTZ,and Losartan daily.Takes Clonidine if needed  . Peripheral neuropathy (HCC)     takes Gabapentin daily  . Dysrhythmia     A Fib-takes Xarelto daily  . CHF (congestive heart failure) (HCC)     takes Lasix daily  . 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 Stroke [facility]) 4 1/2 yrs ago  . COPD (chronic obstructive pulmonary disease) (HCC)     inhalers as needed  . Pneumonia     hx of  . History of bronchitis   . Restless leg   . Arthritis   . Back pain     age related  . Nocturia   . Enlarged prostate     benign  . Diabetes mellitus without complication ([place])     borderline but doesn't take any meds  . Glaucoma     mild  . Complication of anesthesia     bronchial spasms for about 6 wks after being intubated    Past Surgical History  Procedure Laterality Date  . Cholecystectomy  1995  . Eye surgery Bilateral     cataracts  . Right ulnar surgery    . Joint replacement  2008    left TKA  . Partial knee arthroplasty Right 04/06/2016    Procedure: UNICOMPARTMENTAL RIGHT KNEE;  Surgeon: Renette Butters, MD;  Location: [facility];  Service:  Orthopedics;  Laterality: Right;    There were no vitals filed for this visit.      Subjective Assessment - 04/13/16 1158    Subjective No pain or problems today - a little stiff across the incision    Currently in Pain? Yes   Pain Score 3    Pain Location Knee   Pain Orientation Right   Pain Descriptors / Indicators Aching;Sore   Pain Type Surgical pain;Acute pain   Pain Onset 1 to 4 weeks ago   Pain Frequency Intermittent            [facility] PT Assessment - 04/13/16 0001    Assessment   Medical Diagnosis Rt partial knee replacement    Referring Provider Dr. Fredonia Highland   Onset Date/Surgical Date 04/06/16   Hand Dominance Right   Next MD Visit 04/21/16   Prior Therapy PT in the hospital after surgery    AROM   Right Knee Flexion 113   Left Knee Extension 0   Left Knee Flexion 118  [facility] Adult PT Treatment/Exercise - 04/13/16 0001    Knee/Hip Exercises: Stretches   Passive Hamstring Stretch 3 reps;30 seconds   Knee: Self-Stretch to increase Flexion 5 reps;10 seconds   Knee/Hip Exercises: Aerobic   Nustep L4 x 5 min    Knee/Hip Exercises: Standing   Other Standing Knee Exercises knee extension in standing hold 10-15 sec x 8   Knee/Hip Exercises: Seated   Knee/Hip Flexion sitting at edge of chair- scoot forward to stretch hold 20 sec x 5    Sit to Sand 2 sets;5 reps   Knee/Hip Exercises: Supine   Quad Sets Right;10 reps  10 sec hold   Straight Leg Raises Right;10 reps  5 sec hold    Knee/Hip Exercises: Sidelying   Hip ABduction Right;10 reps   Clams Right x 10    Knee/Hip Exercises: Prone   Hip Extension Right;10 reps   Electrical Stimulation   Electrical Stimulation Location Rt knee    Electrical Stimulation Action HVGS   Electrical Stimulation Parameters to tolerance   Electrical Stimulation Goals Edema;Pain   Vasopneumatic   Number Minutes Vasopneumatic  15 minutes   Vasopnuematic Location  Knee   Vasopneumatic Pressure Low    Vasopneumatic Temperature  3*                PT Education - 04/13/16 1234    Education provided Yes   Education Details HEP    Person(s) Educated Patient   Methods Explanation;Demonstration;Tactile cues;Verbal cues;Handout   Comprehension Verbalized understanding;Returned demonstration;Verbal cues required;Tactile cues required          PT Short Term Goals - 04/13/16 1242    PT SHORT TERM GOAL #1   Title Initiate HEP 04/23/16   Time 2   Period Weeks   Status On-going   PT SHORT TERM GOAL #2   Title Increase AROM Rt knee to 0 deg extension to 120 deg flexion 05/21/16   Time 6   Period Weeks   Status On-going   PT SHORT TERM GOAL #3   Title 5/5 strength Rt hip and knee 05/21/16   Time 6   Period Weeks   Status On-going   PT SHORT TERM GOAL #4   Title Improve gait pattern without assistive device 05/21/16   Time 6   Period Weeks   Status On-going           PT Long Term Goals - 04/13/16 1242    PT LONG TERM GOAL #1   Title Increase gait to 20-30 min with no pain 07/02/16   Time 12   Period Weeks   Status On-going   PT LONG TERM GOAL #2   Title 5/5 stength Rt LE 07/02/16   Time 12   Period Weeks   Status On-going   PT LONG TERM GOAL #3   Title Lt knee ROM 0 deg ext to 125-130 deg flexion 07/02/16   Time 12   Period Weeks   Status On-going   PT LONG TERM GOAL #4   Title Independent in HEP 07/02/16   Time 12   Period Weeks   Status On-going   PT LONG TERM GOAL #5   Title Improve FOTO to </= 37% limitation 07/02/16   Time 12   Period Weeks   Status On-going               Plan - 04/13/16 1241    Clinical Impression Statement Bill tolerated new exercises today without difficulty. He demonstrates increased ROM Rt  knee. Progressing toward stated goals of therapy.    Rehab Potential Good   PT Frequency 2x / week   PT Duration 12 weeks   PT Treatment/Interventions Patient/family education;ADLs/Self Care Home Management;Manual techniques;Dry  needling;Neuromuscular re-education;Cryotherapy;Electrical Stimulation;Iontophoresis 4mg /ml Dexamethasone;Moist Heat;Ultrasound;Therapeutic activities;Therapeutic exercise   PT Next Visit Plan Progress with ROM and strengthening Rt LE   PT Home Exercise Plan HEP   Consulted and Agree with Plan of Care Patient      Patient will benefit from skilled therapeutic intervention in order to improve the following deficits and impairments:  Postural dysfunction, Improper body mechanics, Abnormal gait, Pain, Decreased range of motion, Decreased mobility, Decreased strength, Decreased endurance, Decreased activity tolerance  Visit Diagnosis: Right knee pain  Abnormality of gait  Other symptoms and signs involving the musculoskeletal system     Problem List Patient Active Problem List   Diagnosis Date Noted  . Knee osteoarthritis 04/06/2016    Celyn Nilda Simmer PT, MPH  04/13/2016, 12:44 PM  [facility] [place], [name], [address] Phone: [phone]   Fax:  [phone]  Name: SARTHAK TONN MRN: [id number] Date of Birth: 10-20-1946

## 2016-04-16 ENCOUNTER — Ambulatory Visit (INDEPENDENT_AMBULATORY_CARE_PROVIDER_SITE_OTHER): Payer: Medicare Other | Admitting: Physical Therapy

## 2016-04-16 DIAGNOSIS — R29898 Other symptoms and signs involving the musculoskeletal system: Secondary | ICD-10-CM | POA: Diagnosis not present

## 2016-04-16 DIAGNOSIS — R269 Unspecified abnormalities of gait and mobility: Secondary | ICD-10-CM | POA: Diagnosis not present

## 2016-04-16 DIAGNOSIS — M25561 Pain in right knee: Secondary | ICD-10-CM

## 2016-04-16 NOTE — Therapy (Signed)
[facility] [place], [name], [address] Phone: [phone]   Fax:  [phone]  Physical Therapy Treatment  Patient Details  Name: Willie Pineda MRN: [id number] Date of Birth: 12/29/1945 Referring Provider: Dr. Fredonia Highland  Encounter Date: 04/16/2016      PT End of Session - 04/16/16 1109    Visit Number 3   Number of Visits 12   Date for PT Re-Evaluation 05/21/16   PT Start Time 6606   PT Stop Time 1200   PT Time Calculation (min) 55 min   Activity Tolerance Patient tolerated treatment well;No increased pain      Past Medical History  Diagnosis Date  . Hyperlipidemia     takes Atorvastatin daily  . GERD (gastroesophageal reflux disease)     takes Omeprazole daily  . Depression     takes Zoloft daily  . Hypertension     takes Metoprolol,HCTZ,and Losartan daily.Takes Clonidine if needed  . Peripheral neuropathy (HCC)     takes Gabapentin daily  . Dysrhythmia     A Fib-takes Xarelto daily  . CHF (congestive heart failure) (HCC)     takes Lasix daily  . 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 Stroke [facility]) 4 1/2 yrs ago  . COPD (chronic obstructive pulmonary disease) (HCC)     inhalers as needed  . Pneumonia     hx of  . History of bronchitis   . Restless leg   . Arthritis   . Back pain     age related  . Nocturia   . Enlarged prostate     benign  . Diabetes mellitus without complication ([place])     borderline but doesn't take any meds  . Glaucoma     mild  . Complication of anesthesia     bronchial spasms for about 6 wks after being intubated    Past Surgical History  Procedure Laterality Date  . Cholecystectomy  1995  . Eye surgery Bilateral     cataracts  . Right ulnar surgery    . Joint replacement  2008    left TKA  . Partial knee arthroplasty Right 04/06/2016    Procedure: UNICOMPARTMENTAL RIGHT KNEE;  Surgeon: Renette Butters, MD;   Location: [facility];  Service: Orthopedics;  Laterality: Right;    There were no vitals filed for this visit.      Subjective Assessment - 04/16/16 1108    Subjective Pt reports the swelling in Rt knee has gone down a little.     Patient Stated Goals full ROM in Rt knee - good strength - walk without limp    Currently in Pain? Yes   Pain Score 6    Pain Location Knee   Pain Orientation Right   Pain Descriptors / Indicators Aching;Sore   Aggravating Factors  prolonged positions   Pain Relieving Factors ice, rest, medicine.             [facility] PT Assessment - 04/16/16 0001    Assessment   Medical Diagnosis Rt partial knee replacement    Referring Provider Dr. Fredonia Highland   Onset Date/Surgical Date 04/06/16   Hand Dominance Right   Next MD Visit 04/21/16   Prior Therapy PT in the hospital after surgery    AROM   Right Knee Extension -3   Right Knee Flexion 120  [facility] PT Treatment/Exercise - 04/16/16 0001    Knee/Hip Exercises: Stretches   Knee: Self-Stretch to increase Flexion Right;20 seconds  x 8 reps, with strap   ITB Stretch Right;2 reps;20 seconds   Gastroc Stretch Right;3 reps   Knee/Hip Exercises: Aerobic   Recumbent Bike L1: 5 min   Knee/Hip Exercises: Standing   Lateral Step Up Right;2 sets;10 reps;Hand Hold: 2;Step Height: 6"   Forward Step Up Right;10 reps;Hand Hold: 2;Step Height: 6";2 sets   SLS RLE - 2 sec, Lt 3-4 sec - mulitple trials   Other Standing Knee Exercises Rt TKE with green band x 10 reps with 5 sec hold    Other Standing Knee Exercises tandem stance weight shifts    Knee/Hip Exercises: Seated   Sit to Sand 1 set;10 reps;without UE support  VC for even weight between feet.    Knee/Hip Exercises: Supine   Quad Sets Right;10 reps  10 sec hold   Electrical Stimulation   Electrical Stimulation Location Rt knee   Electrical Stimulation Action ion repelling    Electrical Stimulation Parameters to tolerance    Electrical Stimulation  Goals Edema;Pain   Vasopneumatic   Number Minutes Vasopneumatic  15 minutes   Vasopnuematic Location  Knee   Vasopneumatic Pressure Medium   Vasopneumatic Temperature  3 snowflakes                  PT Short Term Goals - 04/16/16 1154    PT SHORT TERM GOAL #1   Title Initiate HEP 04/23/16   Time 2   Period Weeks   Status Achieved   PT SHORT TERM GOAL #2   Title Increase AROM Rt knee to 0 deg extension to 120 deg flexion 05/21/16   Time 6   Period Weeks   Status Partially Met   PT SHORT TERM GOAL #3   Title 5/5 strength Rt hip and knee 05/21/16   Time 6   Period Weeks   Status On-going   PT SHORT TERM GOAL #4   Title Improve gait pattern without assistive device 05/21/16   Time 6   Period Weeks   Status On-going           PT Long Term Goals - 04/13/16 1242    PT LONG TERM GOAL #1   Title Increase gait to 20-30 min with no pain 07/02/16   Time 12   Period Weeks   Status On-going   PT LONG TERM GOAL #2   Title 5/5 stength Rt LE 07/02/16   Time 12   Period Weeks   Status On-going   PT LONG TERM GOAL #3   Title Lt knee ROM 0 deg ext to 125-130 deg flexion 07/02/16   Time 12   Period Weeks   Status On-going   PT LONG TERM GOAL #4   Title Independent in HEP 07/02/16   Time 12   Period Weeks   Status On-going   PT LONG TERM GOAL #5   Title Improve FOTO to </= 37% limitation 07/02/16   Time 12   Period Weeks   Status On-going               Plan - 04/16/16 1154    Clinical Impression Statement Pt demonstrated improved Rt knee ROM; has met STG #2.  Tolerated all exercises with minimal increase in pain.  Progressing well towards established goals.    Rehab Potential Good   PT Frequency 2x / week   PT Duration  12 weeks   PT Treatment/Interventions Patient/family education;ADLs/Self Care Home Management;Manual techniques;Dry needling;Neuromuscular re-education;Cryotherapy;Electrical Stimulation;Iontophoresis '4mg'$ /ml Dexamethasone;Moist  Heat;Ultrasound;Therapeutic activities;Therapeutic exercise   PT Next Visit Plan Progress with ROM and strengthening Rt LE   Consulted and Agree with Plan of Care Patient      Patient will benefit from skilled therapeutic intervention in order to improve the following deficits and impairments:  Postural dysfunction, Improper body mechanics, Abnormal gait, Pain, Decreased range of motion, Decreased mobility, Decreased strength, Decreased endurance, Decreased activity tolerance  Visit Diagnosis: Right knee pain  Abnormality of gait  Other symptoms and signs involving the musculoskeletal system     Problem List Patient Active Problem List   Diagnosis Date Noted  . Knee osteoarthritis 04/06/2016   Kerin Perna, PTA 04/16/2016 11:56 AM  [facility] [place], [name], [address] Phone: [phone]   Fax:  [phone]  Name: Willie Pineda MRN: [id number] Date of Birth: September 04, 1946

## 2016-04-19 DIAGNOSIS — I429 Cardiomyopathy, unspecified: Secondary | ICD-10-CM | POA: Diagnosis not present

## 2016-04-19 DIAGNOSIS — Z8673 Personal history of transient ischemic attack (TIA), and cerebral infarction without residual deficits: Secondary | ICD-10-CM | POA: Diagnosis not present

## 2016-04-19 DIAGNOSIS — I10 Essential (primary) hypertension: Secondary | ICD-10-CM | POA: Diagnosis not present

## 2016-04-19 DIAGNOSIS — I480 Paroxysmal atrial fibrillation: Secondary | ICD-10-CM | POA: Diagnosis not present

## 2016-04-20 ENCOUNTER — Ambulatory Visit (INDEPENDENT_AMBULATORY_CARE_PROVIDER_SITE_OTHER): Payer: Medicare Other | Admitting: Physical Therapy

## 2016-04-20 DIAGNOSIS — M25561 Pain in right knee: Secondary | ICD-10-CM

## 2016-04-20 DIAGNOSIS — R29898 Other symptoms and signs involving the musculoskeletal system: Secondary | ICD-10-CM | POA: Diagnosis not present

## 2016-04-20 DIAGNOSIS — R269 Unspecified abnormalities of gait and mobility: Secondary | ICD-10-CM

## 2016-04-20 NOTE — Therapy (Addendum)
[place], [name], [address] Phone: [phone]   Fax:  [phone]  Physical Therapy Treatment  Patient Details  Name: Willie Pineda MRN: [id number] Date of Birth: November 29, 1946 Referring Provider: Dr. Fredonia Highland  Encounter Date: 04/20/2016      PT End of Session - 04/20/16 1110    Visit Number 4   Number of Visits 12   Date for PT Re-Evaluation 05/21/16   PT Start Time 1102   PT Stop Time 1148   PT Time Calculation (min) 46 min  (time was excluded for addressing open incision)   Activity Tolerance Treatment limited secondary to medical complications (Comment)  incision came open       Past Medical History  Diagnosis Date  . Hyperlipidemia     takes Atorvastatin daily  . GERD (gastroesophageal reflux disease)     takes Omeprazole daily  . Depression     takes Zoloft daily  . Hypertension     takes Metoprolol,HCTZ,and Losartan daily.Takes Clonidine if needed  . Peripheral neuropathy (HCC)     takes Gabapentin daily  . Dysrhythmia     A Fib-takes Xarelto daily  . CHF (congestive heart failure) (HCC)     takes Lasix daily  . 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 Stroke [facility]) 4 1/2 yrs ago  . COPD (chronic obstructive pulmonary disease) (HCC)     inhalers as needed  . Pneumonia     hx of  . History of bronchitis   . Restless leg   . Arthritis   . Back pain     age related  . Nocturia   . Enlarged prostate     benign  . Diabetes mellitus without complication ([place])     borderline but doesn't take any meds  . Glaucoma     mild  . Complication of anesthesia     bronchial spasms for about 6 wks after being intubated    Past Surgical History  Procedure Laterality Date  . Cholecystectomy  1995  . Eye surgery Bilateral     cataracts  . Right ulnar surgery    . Joint replacement  2008    left TKA  . Partial knee arthroplasty Right 04/06/2016     Procedure: UNICOMPARTMENTAL RIGHT KNEE;  Surgeon: Renette Butters, MD;  Location: [facility];  Service: Orthopedics;  Laterality: Right;    There were no vitals filed for this visit.      Subjective Assessment - 04/20/16 1105    Subjective Pt reports his Rt knee pain has increased over last day, not sure why. Has been icing, but pain persists.  Pt believes he may have "over done it" on Sunday.    Currently in Pain? Yes   Pain Score 8    Pain Location Knee   Pain Orientation Right   Pain Descriptors / Indicators --  stinging   Aggravating Factors  prolonged positions   Pain Relieving Factors ice, rest, medicine            [facility] PT Assessment - 04/20/16 0001    Assessment   Medical Diagnosis Rt partial knee replacement    Onset Date/Surgical Date 04/06/16   Hand Dominance Right   Next MD Visit 04/21/16   Prior Therapy PT in the hospital after surgery    AROM   Right Knee Extension -4  [facility] Adult PT Treatment/Exercise - 04/20/16 0001    Knee/Hip Exercises: Stretches   Gastroc Stretch Right;3 reps   Soleus Stretch Right;2 reps;30 seconds   Other Knee/Hip Stretches Lunges on 13" step to increase Rt knee flexion ROM, followed by standing hamstring stretch x 10 sec hold x 10 reps    Knee/Hip Exercises: Aerobic   Recumbent Bike L2: 5 min   Knee/Hip Exercises: Standing   Other Standing Knee Exercises Rt TKE with green band x 10 reps with 5 sec hold    Other Standing Knee Exercises tandem stance weight shifts    Knee/Hip Exercises: Supine   Heel Slides AAROM;Right;1 set;5 reps  with strap   Heel Slides Limitations stopped, incision opened and guaze was applied to area.    Knee/Hip Exercises: Prone   Hamstring Curl 1 set;10 reps;1 second   Prone Knee Hang 2 minutes  2 reps    Modalities   Modalities Electrical Stimulation;Cryotherapy   Cryotherapy   Number Minutes Cryotherapy 15 Minutes   Cryotherapy Location Knee  Rt   Type of Cryotherapy Ice pack   Electrical  Stimulation   Electrical Stimulation Location Rt knee    Electrical Stimulation Action ion repelling    Electrical Stimulation Parameters to tolerance    Electrical Stimulation Goals Edema;Pain   Vasopneumatic   Number Minutes Vasopneumatic  --   Vasopnuematic Location  --   Vasopneumatic Pressure --   Vasopneumatic Temperature  --                  PT Short Term Goals - 04/16/16 1154    PT SHORT TERM GOAL #1   Title Initiate HEP 04/23/16   Time 2   Period Weeks   Status Achieved   PT SHORT TERM GOAL #2   Title Increase AROM Rt knee to 0 deg extension to 120 deg flexion 05/21/16   Time 6   Period Weeks   Status Partially Met   PT SHORT TERM GOAL #3   Title 5/5 strength Rt hip and knee 05/21/16   Time 6   Period Weeks   Status On-going   PT SHORT TERM GOAL #4   Title Improve gait pattern without assistive device 05/21/16   Time 6   Period Weeks   Status On-going           PT Long Term Goals - 04/13/16 1242    PT LONG TERM GOAL #1   Title Increase gait to 20-30 min with no pain 07/02/16   Time 12   Period Weeks   Status On-going   PT LONG TERM GOAL #2   Title 5/5 stength Rt LE 07/02/16   Time 12   Period Weeks   Status On-going   PT LONG TERM GOAL #3   Title Lt knee ROM 0 deg ext to 125-130 deg flexion 07/02/16   Time 12   Period Weeks   Status On-going   PT LONG TERM GOAL #4   Title Independent in HEP 07/02/16   Time 12   Period Weeks   Status On-going   PT LONG TERM GOAL #5   Title Improve FOTO to </= 37% limitation 07/02/16   Time 12   Period Weeks   Status On-going               Plan - 04/20/16 1151    Clinical Impression Statement Pt tolerated standing exercise well today.  When pt was performing heel slides on mat table with assistance of  strap,  PTA noticed patient's bandage on knee was bloody.  Heel slides stopped.  Pressure applied to area and  leg was elevated.  Bleeding stopped.  Wife was present to assist, as well as Jeral Pinch,  PT.  Wife applied betadine.  Steri strip was applied as well as new pressure dressing.  MD notified via phone message.  Patient has appointment scheduled with MD for 04/21/16. Ice and electric stim applied, with leg elevated at end of treatment.  No knee flexion measurement was captured.    Rehab Potential Good   PT Frequency 2x / week   PT Duration 12 weeks   PT Treatment/Interventions Patient/family education;ADLs/Self Care Home Management;Manual techniques;Dry needling;Neuromuscular re-education;Cryotherapy;Electrical Stimulation;Iontophoresis '4mg'$ /ml Dexamethasone;Moist Heat;Ultrasound;Therapeutic activities;Therapeutic exercise   PT Next Visit Plan Progress with ROM and strengthening Rt LE   Consulted and Agree with Plan of Care Patient      Patient will benefit from skilled therapeutic intervention in order to improve the following deficits and impairments:  Postural dysfunction, Improper body mechanics, Abnormal gait, Pain, Decreased range of motion, Decreased mobility, Decreased strength, Decreased endurance, Decreased activity tolerance  Visit Diagnosis: Right knee pain  Abnormality of gait  Other symptoms and signs involving the musculoskeletal system     Problem List Patient Active Problem List   Diagnosis Date Noted  . Knee osteoarthritis 04/06/2016   Kerin Perna, PTA 04/20/2016 12:53 PM  [facility] [place], [name], [address] Phone: [phone]   Fax:  [phone]  Name: CAMMERON GREIS MRN: [id number] Date of Birth: 06-Jun-1946    PHYSICAL THERAPY DISCHARGE SUMMARY  Visits from Start of Care: 4  Current functional level related to goals / functional outcomes: Patient was progressing well in therapy - had to return to MD and underwent I & D of knee 05/04/16. No additional referral received to date.    Remaining deficits: Patient needs to proceed with knee rehab as medical  condition allows/as ordered by surgeon.    Education / Equipment: HEP  Plan: Patient agrees to discharge.  Patient goals were not met. Patient is being discharged due to a change in medical status.  ?????    Celyn P. Helene Kelp PT, MPH 06/09/2016 3:35 PM

## 2016-04-21 DIAGNOSIS — M25561 Pain in right knee: Secondary | ICD-10-CM | POA: Diagnosis not present

## 2016-04-21 DIAGNOSIS — M25461 Effusion, right knee: Secondary | ICD-10-CM | POA: Diagnosis not present

## 2016-04-21 DIAGNOSIS — M1711 Unilateral primary osteoarthritis, right knee: Secondary | ICD-10-CM | POA: Diagnosis not present

## 2016-04-22 ENCOUNTER — Encounter: Payer: Medicare Other | Admitting: Physical Therapy

## 2016-04-26 DIAGNOSIS — M1711 Unilateral primary osteoarthritis, right knee: Secondary | ICD-10-CM | POA: Diagnosis not present

## 2016-04-27 ENCOUNTER — Encounter: Payer: Medicare Other | Admitting: Physical Therapy

## 2016-04-29 ENCOUNTER — Encounter: Payer: Medicare Other | Admitting: Physical Therapy

## 2016-04-30 ENCOUNTER — Encounter (HOSPITAL_COMMUNITY): Payer: Self-pay | Admitting: *Deleted

## 2016-04-30 DIAGNOSIS — M1711 Unilateral primary osteoarthritis, right knee: Secondary | ICD-10-CM | POA: Diagnosis not present

## 2016-04-30 NOTE — Progress Notes (Signed)
Spoke with pt for pre-op call. He verified that allergies, medications, medical and surgical history have not changed since his surgery here on 04/08/16. He took his last Xarelto today, will not take again until after surgery. Pt denies any chest pain or sob.

## 2016-05-04 ENCOUNTER — Encounter (HOSPITAL_COMMUNITY)
Admission: RE | Disposition: A | Discharge status: Home / Self Care | Payer: Self-pay | Source: Ambulatory Visit | Attending: Orthopedic Surgery

## 2016-05-04 ENCOUNTER — Encounter (HOSPITAL_COMMUNITY): Payer: Self-pay | Admitting: *Deleted

## 2016-05-04 ENCOUNTER — Observation Stay (HOSPITAL_COMMUNITY): Payer: Medicare Other

## 2016-05-04 ENCOUNTER — Ambulatory Visit (HOSPITAL_COMMUNITY): Payer: Medicare Other | Admitting: Certified Registered Nurse Anesthetist

## 2016-05-04 ENCOUNTER — Observation Stay (HOSPITAL_COMMUNITY)
Admission: RE | Admit: 2016-05-04 | Discharge: 2016-05-05 | Disposition: A | Discharge status: Home / Self Care | Payer: Medicare Other | Source: Ambulatory Visit | Attending: Orthopedic Surgery | Admitting: Orthopedic Surgery

## 2016-05-04 ENCOUNTER — Encounter: Payer: Medicare Other | Admitting: Physical Therapy

## 2016-05-04 DIAGNOSIS — G8918 Other acute postprocedural pain: Secondary | ICD-10-CM | POA: Diagnosis not present

## 2016-05-04 DIAGNOSIS — Z8673 Personal history of transient ischemic attack (TIA), and cerebral infarction without residual deficits: Secondary | ICD-10-CM | POA: Insufficient documentation

## 2016-05-04 DIAGNOSIS — K219 Gastro-esophageal reflux disease without esophagitis: Secondary | ICD-10-CM | POA: Diagnosis not present

## 2016-05-04 DIAGNOSIS — T8132XA Disruption of internal operation (surgical) wound, not elsewhere classified, initial encounter: Secondary | ICD-10-CM | POA: Diagnosis not present

## 2016-05-04 DIAGNOSIS — Z6841 Body Mass Index (BMI) 40.0 and over, adult: Secondary | ICD-10-CM | POA: Insufficient documentation

## 2016-05-04 DIAGNOSIS — Y792 Prosthetic and other implants, materials and accessory orthopedic devices associated with adverse incidents: Secondary | ICD-10-CM

## 2016-05-04 DIAGNOSIS — Z87891 Personal history of nicotine dependence: Secondary | ICD-10-CM | POA: Insufficient documentation

## 2016-05-04 DIAGNOSIS — I110 Hypertensive heart disease with heart failure: Secondary | ICD-10-CM | POA: Diagnosis not present

## 2016-05-04 DIAGNOSIS — Z7901 Long term (current) use of anticoagulants: Secondary | ICD-10-CM | POA: Diagnosis not present

## 2016-05-04 DIAGNOSIS — Z79899 Other long term (current) drug therapy: Secondary | ICD-10-CM | POA: Insufficient documentation

## 2016-05-04 DIAGNOSIS — B9689 Other specified bacterial agents as the cause of diseases classified elsewhere: Secondary | ICD-10-CM | POA: Diagnosis not present

## 2016-05-04 DIAGNOSIS — J449 Chronic obstructive pulmonary disease, unspecified: Secondary | ICD-10-CM | POA: Insufficient documentation

## 2016-05-04 DIAGNOSIS — F329 Major depressive disorder, single episode, unspecified: Secondary | ICD-10-CM | POA: Diagnosis not present

## 2016-05-04 DIAGNOSIS — T814XXA Infection following a procedure, initial encounter: Secondary | ICD-10-CM | POA: Diagnosis not present

## 2016-05-04 DIAGNOSIS — Y831 Surgical operation with implant of artificial internal device as the cause of abnormal reaction of the patient, or of later complication, without mention of misadventure at the time of the procedure: Secondary | ICD-10-CM | POA: Insufficient documentation

## 2016-05-04 DIAGNOSIS — T8453XA Infection and inflammatory reaction due to internal right knee prosthesis, initial encounter: Secondary | ICD-10-CM | POA: Insufficient documentation

## 2016-05-04 DIAGNOSIS — H409 Unspecified glaucoma: Secondary | ICD-10-CM | POA: Insufficient documentation

## 2016-05-04 DIAGNOSIS — Z22321 Carrier or suspected carrier of Methicillin susceptible Staphylococcus aureus: Secondary | ICD-10-CM

## 2016-05-04 DIAGNOSIS — I509 Heart failure, unspecified: Secondary | ICD-10-CM

## 2016-05-04 DIAGNOSIS — T8130XA Disruption of wound, unspecified, initial encounter: Secondary | ICD-10-CM | POA: Diagnosis not present

## 2016-05-04 DIAGNOSIS — I4891 Unspecified atrial fibrillation: Secondary | ICD-10-CM | POA: Diagnosis not present

## 2016-05-04 DIAGNOSIS — G2581 Restless legs syndrome: Secondary | ICD-10-CM | POA: Insufficient documentation

## 2016-05-04 DIAGNOSIS — E785 Hyperlipidemia, unspecified: Secondary | ICD-10-CM | POA: Diagnosis not present

## 2016-05-04 DIAGNOSIS — T8131XD Disruption of external operation (surgical) wound, not elsewhere classified, subsequent encounter: Secondary | ICD-10-CM | POA: Diagnosis present

## 2016-05-04 DIAGNOSIS — E1142 Type 2 diabetes mellitus with diabetic polyneuropathy: Secondary | ICD-10-CM | POA: Diagnosis not present

## 2016-05-04 DIAGNOSIS — E669 Obesity, unspecified: Secondary | ICD-10-CM | POA: Diagnosis not present

## 2016-05-04 DIAGNOSIS — Z96651 Presence of right artificial knee joint: Secondary | ICD-10-CM | POA: Diagnosis not present

## 2016-05-04 DIAGNOSIS — Z452 Encounter for adjustment and management of vascular access device: Secondary | ICD-10-CM | POA: Diagnosis not present

## 2016-05-04 HISTORY — DX: Unspecified atrial fibrillation: I48.91

## 2016-05-04 HISTORY — DX: Type 2 diabetes mellitus without complications: E11.9

## 2016-05-04 HISTORY — PX: I & D KNEE WITH POLY EXCHANGE: SHX5024

## 2016-05-04 LAB — CBC
HCT: 44.7 % (ref 39.0–52.0)
Hemoglobin: 14.2 g/dL (ref 13.0–17.0)
MCH: 30.7 pg (ref 26.0–34.0)
MCHC: 31.8 g/dL (ref 30.0–36.0)
MCV: 96.5 fL (ref 78.0–100.0)
Platelets: 241 10*3/uL (ref 150–400)
RBC: 4.63 MIL/uL (ref 4.22–5.81)
RDW: 13.3 % (ref 11.5–15.5)
WBC: 9.3 10*3/uL (ref 4.0–10.5)

## 2016-05-04 LAB — BASIC METABOLIC PANEL
Anion gap: 11 (ref 5–15)
BUN: 17 mg/dL (ref 6–20)
CO2: 30 mmol/L (ref 22–32)
Calcium: 8.7 mg/dL — ABNORMAL LOW (ref 8.9–10.3)
Chloride: 97 mmol/L — ABNORMAL LOW (ref 101–111)
Creatinine, Ser: 1.07 mg/dL (ref 0.61–1.24)
GFR calc Af Amer: >60 mL/min (ref >60)
GFR calc non Af Amer: >60 mL/min (ref >60)
Glucose, Bld: 146 mg/dL — ABNORMAL HIGH (ref 65–99)
Potassium: 3.4 mmol/L — ABNORMAL LOW (ref 3.5–5.1)
Sodium: 138 mmol/L (ref 135–145)

## 2016-05-04 LAB — GLUCOSE, CAPILLARY
Glucose-Capillary: 157 mg/dL — ABNORMAL HIGH (ref 65–99)
Glucose-Capillary: 121 mg/dL — ABNORMAL HIGH (ref 65–99)

## 2016-05-04 LAB — TYPE AND SCREEN
ABO/RH(D): O POS
Antibody Screen: NEGATIVE

## 2016-05-04 LAB — PROTIME-INR
INR: 1.12 (ref 0.00–1.49)
Prothrombin Time: 14.6 seconds (ref 11.6–15.2)

## 2016-05-04 SURGERY — IRRIGATION AND DEBRIDEMENT KNEE WITH POLY EXCHANGE
Anesthesia: Monitor Anesthesia Care | Site: Knee | Laterality: Right

## 2016-05-04 MED ORDER — SODIUM CHLORIDE 0.9 % IR SOLN
Status: DC | PRN
  Administered 2016-05-04: 1000 mL
  Administered 2016-05-04: 3000 mL

## 2016-05-04 MED ORDER — GABAPENTIN 300 MG PO CAPS
600.0000 mg | ORAL_CAPSULE | Freq: Every day | ORAL | Status: DC
  Administered 2016-05-04: 600 mg via ORAL
  Filled 2016-05-04: qty 2

## 2016-05-04 MED ORDER — PROPOFOL 500 MG/50ML IV EMUL
INTRAVENOUS | Status: DC | PRN
  Administered 2016-05-04: 75 ug/kg/min via INTRAVENOUS

## 2016-05-04 MED ORDER — PHENYLEPHRINE 40 MCG/ML (10ML) SYRINGE FOR IV PUSH (FOR BLOOD PRESSURE SUPPORT)
PREFILLED_SYRINGE | INTRAVENOUS | Status: AC
  Filled 2016-05-04: qty 20

## 2016-05-04 MED ORDER — PHENOL 1.4 % MT LIQD: 1.0000 | OROMUCOSAL | Status: DC | PRN

## 2016-05-04 MED ORDER — FENTANYL CITRATE (PF) 250 MCG/5ML IJ SOLN
INTRAMUSCULAR | Status: AC
  Filled 2016-05-04: qty 5

## 2016-05-04 MED ORDER — TIOTROPIUM BROMIDE MONOHYDRATE 18 MCG IN CAPS
18.0000 ug | ORAL_CAPSULE | Freq: Every day | RESPIRATORY_TRACT | Status: DC | PRN
  Filled 2016-05-04: qty 5

## 2016-05-04 MED ORDER — PHENYLEPHRINE HCL 10 MG/ML IJ SOLN
10.0000 mg | INTRAVENOUS | Status: DC | PRN
  Administered 2016-05-04: 20 ug/min via INTRAVENOUS

## 2016-05-04 MED ORDER — SODIUM CHLORIDE 0.9 % IR SOLN
Status: DC | PRN
  Administered 2016-05-04: 3000 mL

## 2016-05-04 MED ORDER — RIVAROXABAN 20 MG PO TABS: 20.0000 mg | ORAL_TABLET | Freq: Every day | ORAL | Status: DC

## 2016-05-04 MED ORDER — ROCURONIUM BROMIDE 50 MG/5ML IV SOLN
INTRAVENOUS | Status: AC
  Filled 2016-05-04: qty 1

## 2016-05-04 MED ORDER — FENTANYL CITRATE (PF) 100 MCG/2ML IJ SOLN
INTRAMUSCULAR | Status: DC | PRN
  Administered 2016-05-04 (×2): 50 ug via INTRAVENOUS

## 2016-05-04 MED ORDER — LACTATED RINGERS IV SOLN
INTRAVENOUS | Status: DC
  Administered 2016-05-04: 13:00:00 via INTRAVENOUS

## 2016-05-04 MED ORDER — CLONIDINE HCL 0.1 MG PO TABS: 0.1000 mg | ORAL_TABLET | Freq: Every day | ORAL | Status: DC | PRN

## 2016-05-04 MED ORDER — DOCUSATE SODIUM 100 MG PO CAPS
100.0000 mg | ORAL_CAPSULE | Freq: Two times a day (BID) | ORAL | Status: DC
  Administered 2016-05-04 – 2016-05-05 (×2): 100 mg via ORAL
  Filled 2016-05-04 (×2): qty 1

## 2016-05-04 MED ORDER — VANCOMYCIN HCL 10 G IV SOLR
1500.0000 mg | INTRAVENOUS | Status: AC
  Administered 2016-05-04: 1500 mg via INTRAVENOUS
  Filled 2016-05-04: qty 1500

## 2016-05-04 MED ORDER — ONDANSETRON HCL 4 MG/2ML IJ SOLN: 4.0000 mg | Freq: Four times a day (QID) | INTRAMUSCULAR | Status: DC | PRN

## 2016-05-04 MED ORDER — ALBUTEROL SULFATE (2.5 MG/3ML) 0.083% IN NEBU: 3.0000 mL | INHALATION_SOLUTION | RESPIRATORY_TRACT | Status: DC | PRN

## 2016-05-04 MED ORDER — PANTOPRAZOLE SODIUM 40 MG PO TBEC
40.0000 mg | DELAYED_RELEASE_TABLET | Freq: Every day | ORAL | Status: DC
  Administered 2016-05-05: 40 mg via ORAL
  Filled 2016-05-04: qty 1

## 2016-05-04 MED ORDER — BUPIVACAINE-EPINEPHRINE (PF) 0.5% -1:200000 IJ SOLN
INTRAMUSCULAR | Status: DC | PRN
  Administered 2016-05-04: 30 mL via PERINEURAL

## 2016-05-04 MED ORDER — EPHEDRINE SULFATE 50 MG/ML IJ SOLN
INTRAMUSCULAR | Status: DC | PRN
  Administered 2016-05-04: 5 mg via INTRAVENOUS

## 2016-05-04 MED ORDER — ATORVASTATIN CALCIUM 20 MG PO TABS
20.0000 mg | ORAL_TABLET | Freq: Every day | ORAL | Status: DC
  Administered 2016-05-05: 20 mg via ORAL
  Filled 2016-05-04: qty 1

## 2016-05-04 MED ORDER — MIDAZOLAM HCL 5 MG/5ML IJ SOLN
INTRAMUSCULAR | Status: DC | PRN
  Administered 2016-05-04: 2 mg via INTRAVENOUS

## 2016-05-04 MED ORDER — DEXTROSE-NACL 5-0.45 % IV SOLN
INTRAVENOUS | Status: AC
  Administered 2016-05-04: 19:00:00 via INTRAVENOUS

## 2016-05-04 MED ORDER — METOCLOPRAMIDE HCL 5 MG PO TABS: 5.0000 mg | ORAL_TABLET | Freq: Three times a day (TID) | ORAL | Status: DC | PRN

## 2016-05-04 MED ORDER — LORATADINE 10 MG PO TABS
10.0000 mg | ORAL_TABLET | Freq: Every day | ORAL | Status: DC
  Administered 2016-05-04 – 2016-05-05 (×2): 10 mg via ORAL
  Filled 2016-05-04 (×2): qty 1

## 2016-05-04 MED ORDER — CHLORHEXIDINE GLUCONATE 4 % EX LIQD: 60.0000 mL | Freq: Once | CUTANEOUS | Status: DC

## 2016-05-04 MED ORDER — LOSARTAN POTASSIUM 50 MG PO TABS
100.0000 mg | ORAL_TABLET | Freq: Every day | ORAL | Status: DC
  Administered 2016-05-05: 100 mg via ORAL
  Filled 2016-05-04: qty 2

## 2016-05-04 MED ORDER — SUGAMMADEX SODIUM 200 MG/2ML IV SOLN
INTRAVENOUS | Status: AC
  Filled 2016-05-04: qty 2

## 2016-05-04 MED ORDER — ONDANSETRON HCL 4 MG PO TABS: 4.0000 mg | ORAL_TABLET | Freq: Four times a day (QID) | ORAL | Status: DC | PRN

## 2016-05-04 MED ORDER — SODIUM CHLORIDE 0.9% FLUSH: 10.0000 mL | INTRAVENOUS | Status: DC | PRN

## 2016-05-04 MED ORDER — GABAPENTIN 600 MG PO TABS
600.0000 mg | ORAL_TABLET | Freq: Every day | ORAL | Status: DC
  Filled 2016-05-04: qty 1

## 2016-05-04 MED ORDER — METOPROLOL SUCCINATE ER 50 MG PO TB24
50.0000 mg | ORAL_TABLET | Freq: Every day | ORAL | Status: DC
  Administered 2016-05-05: 50 mg via ORAL
  Filled 2016-05-04 (×2): qty 1

## 2016-05-04 MED ORDER — MENTHOL 3 MG MT LOZG: 1.0000 | LOZENGE | OROMUCOSAL | Status: DC | PRN

## 2016-05-04 MED ORDER — VANCOMYCIN HCL IN DEXTROSE 1-5 GM/200ML-% IV SOLN
1000.0000 mg | Freq: Two times a day (BID) | INTRAVENOUS | Status: DC
  Filled 2016-05-04: qty 200

## 2016-05-04 MED ORDER — METOCLOPRAMIDE HCL 5 MG/ML IJ SOLN: 5.0000 mg | Freq: Three times a day (TID) | INTRAMUSCULAR | Status: DC | PRN

## 2016-05-04 MED ORDER — BUPIVACAINE IN DEXTROSE 0.75-8.25 % IT SOLN
INTRATHECAL | Status: DC | PRN
  Administered 2016-05-04: 15 mg via INTRATHECAL

## 2016-05-04 MED ORDER — METHOCARBAMOL 1000 MG/10ML IJ SOLN
500.0000 mg | Freq: Four times a day (QID) | INTRAVENOUS | Status: DC | PRN
  Filled 2016-05-04: qty 5

## 2016-05-04 MED ORDER — MORPHINE SULFATE (PF) 2 MG/ML IV SOLN: 2.0000 mg | INTRAVENOUS | Status: DC | PRN

## 2016-05-04 MED ORDER — PROPOFOL 10 MG/ML IV BOLUS
INTRAVENOUS | Status: AC
  Filled 2016-05-04: qty 20

## 2016-05-04 MED ORDER — ACETAMINOPHEN 325 MG PO TABS: 650.0000 mg | ORAL_TABLET | Freq: Four times a day (QID) | ORAL | Status: DC | PRN

## 2016-05-04 MED ORDER — DEXTROSE-NACL 5-0.45 % IV SOLN: 100.0000 mL/h | INTRAVENOUS | Status: DC

## 2016-05-04 MED ORDER — OXYCODONE HCL 5 MG PO TABS
5.0000 mg | ORAL_TABLET | ORAL | Status: DC | PRN
Start: 2016-05-04 — End: 2016-05-05
  Administered 2016-05-04 – 2016-05-05 (×3): 10 mg via ORAL
  Filled 2016-05-04 (×3): qty 2

## 2016-05-04 MED ORDER — FUROSEMIDE 40 MG PO TABS
80.0000 mg | ORAL_TABLET | Freq: Two times a day (BID) | ORAL | Status: DC
  Administered 2016-05-04 – 2016-05-05 (×2): 80 mg via ORAL
  Filled 2016-05-04 (×2): qty 2

## 2016-05-04 MED ORDER — ACETAMINOPHEN 650 MG RE SUPP: 650.0000 mg | Freq: Four times a day (QID) | RECTAL | Status: DC | PRN

## 2016-05-04 MED ORDER — CELECOXIB 200 MG PO CAPS
200.0000 mg | ORAL_CAPSULE | Freq: Two times a day (BID) | ORAL | Status: DC
  Administered 2016-05-04 – 2016-05-05 (×2): 200 mg via ORAL
  Filled 2016-05-04 (×2): qty 1

## 2016-05-04 MED ORDER — AMIODARONE HCL 100 MG PO TABS
200.0000 mg | ORAL_TABLET | Freq: Every day | ORAL | Status: DC
  Administered 2016-05-05: 200 mg via ORAL
  Filled 2016-05-04: qty 2

## 2016-05-04 MED ORDER — ACETAMINOPHEN 500 MG PO TABS
1000.0000 mg | ORAL_TABLET | Freq: Once | ORAL | Status: AC
  Administered 2016-05-04: 1000 mg via ORAL
  Filled 2016-05-04: qty 2

## 2016-05-04 MED ORDER — SERTRALINE HCL 100 MG PO TABS
100.0000 mg | ORAL_TABLET | Freq: Every day | ORAL | Status: DC
  Administered 2016-05-05: 100 mg via ORAL
  Filled 2016-05-04: qty 1

## 2016-05-04 MED ORDER — METHOCARBAMOL 500 MG PO TABS: 500.0000 mg | ORAL_TABLET | Freq: Four times a day (QID) | ORAL | Status: DC | PRN

## 2016-05-04 MED ORDER — PHENYLEPHRINE HCL 10 MG/ML IJ SOLN
INTRAMUSCULAR | Status: DC | PRN
  Administered 2016-05-04 (×2): 120 ug via INTRAVENOUS

## 2016-05-04 MED ORDER — HYDROCHLOROTHIAZIDE 25 MG PO TABS
25.0000 mg | ORAL_TABLET | Freq: Every day | ORAL | Status: DC
  Administered 2016-05-05: 25 mg via ORAL
  Filled 2016-05-04: qty 1

## 2016-05-04 MED ORDER — MIDAZOLAM HCL 2 MG/2ML IJ SOLN
INTRAMUSCULAR | Status: AC
  Filled 2016-05-04: qty 2

## 2016-05-04 MED ORDER — VANCOMYCIN HCL 10 G IV SOLR
1250.0000 mg | Freq: Two times a day (BID) | INTRAVENOUS | Status: DC
  Administered 2016-05-05 (×2): 1250 mg via INTRAVENOUS
  Filled 2016-05-04 (×3): qty 1250

## 2016-05-04 SURGICAL SUPPLY — 63 items
BANDAGE ESMARK 6X9 LF (GAUZE/BANDAGES/DRESSINGS) IMPLANT
BEARING TIBIAL STRL SZ3 LG 3 (Miscellaneous) ×3 IMPLANT
BLADE SAG 18X100X1.27 (BLADE) IMPLANT
BNDG COHESIVE 6X5 TAN STRL LF (GAUZE/BANDAGES/DRESSINGS) IMPLANT
BNDG ELASTIC 6X10 VLCR STRL LF (GAUZE/BANDAGES/DRESSINGS) ×3 IMPLANT
BNDG ESMARK 6X9 LF (GAUZE/BANDAGES/DRESSINGS)
BOWL SMART MIX CTS (DISPOSABLE) IMPLANT
CLSR STERI-STRIP ANTIMIC 1/2X4 (GAUZE/BANDAGES/DRESSINGS) IMPLANT
CONT SPEC 4OZ CLIKSEAL STRL BL (MISCELLANEOUS) ×3 IMPLANT
COVER SURGICAL LIGHT HANDLE (MISCELLANEOUS) IMPLANT
CUFF TOURNIQUET SINGLE 34IN LL (TOURNIQUET CUFF) IMPLANT
DRAPE EXTREMITY T 121X128X90 (DRAPE) IMPLANT
DRAPE U-SHAPE 47X51 STRL (DRAPES) ×3 IMPLANT
DRSG ADAPTIC 3X8 NADH LF (GAUZE/BANDAGES/DRESSINGS) ×3 IMPLANT
DRSG MEPILEX BORDER 4X8 (GAUZE/BANDAGES/DRESSINGS) IMPLANT
DURAPREP 26ML APPLICATOR (WOUND CARE) ×6 IMPLANT
ELECT CAUTERY BLADE 6.4 (BLADE) IMPLANT
ELECT REM PT RETURN 9FT ADLT (ELECTROSURGICAL)
ELECTRODE REM PT RTRN 9FT ADLT (ELECTROSURGICAL) IMPLANT
FACESHIELD WRAPAROUND (MASK) IMPLANT
GAUZE SPONGE 4X4 12PLY STRL (GAUZE/BANDAGES/DRESSINGS) IMPLANT
GLOVE BIO SURGEON STRL SZ7 (GLOVE) ×6 IMPLANT
GLOVE BIO SURGEON STRL SZ7.5 (GLOVE) ×6 IMPLANT
GLOVE BIOGEL PI IND STRL 7.0 (GLOVE) ×4 IMPLANT
GLOVE BIOGEL PI IND STRL 8 (GLOVE) IMPLANT
GLOVE BIOGEL PI INDICATOR 7.0 (GLOVE) ×2
GLOVE BIOGEL PI INDICATOR 8 (GLOVE)
GOWN STRL REUS W/ TWL LRG LVL3 (GOWN DISPOSABLE) ×4 IMPLANT
GOWN STRL REUS W/ TWL XL LVL3 (GOWN DISPOSABLE) IMPLANT
GOWN STRL REUS W/TWL LRG LVL3 (GOWN DISPOSABLE) ×2
GOWN STRL REUS W/TWL XL LVL3 (GOWN DISPOSABLE)
HANDPIECE INTERPULSE COAX TIP (DISPOSABLE)
IMMOBILIZER KNEE 22 (SOFTGOODS) ×3 IMPLANT
IMMOBILIZER KNEE 22 UNIV (SOFTGOODS) IMPLANT
IMMOBILIZER KNEE 24 THIGH 36 (MISCELLANEOUS) IMPLANT
IMMOBILIZER KNEE 24 UNIV (MISCELLANEOUS)
KIT BASIN OR (CUSTOM PROCEDURE TRAY) IMPLANT
KIT ROOM TURNOVER OR (KITS) IMPLANT
LIQUID BAND (GAUZE/BANDAGES/DRESSINGS) IMPLANT
MANIFOLD NEPTUNE II (INSTRUMENTS) IMPLANT
NS IRRIG 1000ML POUR BTL (IV SOLUTION) IMPLANT
PACK TOTAL JOINT (CUSTOM PROCEDURE TRAY) IMPLANT
PACK UNIVERSAL I (CUSTOM PROCEDURE TRAY) IMPLANT
PAD ABD 8X10 STRL (GAUZE/BANDAGES/DRESSINGS) ×6 IMPLANT
PAD ARMBOARD 7.5X6 YLW CONV (MISCELLANEOUS) IMPLANT
SET HNDPC FAN SPRY TIP SCT (DISPOSABLE) IMPLANT
SPONGE GAUZE 4X4 12PLY STER LF (GAUZE/BANDAGES/DRESSINGS) ×3 IMPLANT
STAPLER VISISTAT 35W (STAPLE) IMPLANT
SUCTION FRAZIER HANDLE 10FR (MISCELLANEOUS)
SUCTION TUBE FRAZIER 10FR DISP (MISCELLANEOUS) IMPLANT
SUT ETHILON 3 0 PS 1 (SUTURE) ×15 IMPLANT
SUT MNCRL AB 4-0 PS2 18 (SUTURE) IMPLANT
SUT MON AB 2-0 CT1 27 (SUTURE) IMPLANT
SUT PDS AB 1 CT  36 (SUTURE) ×1
SUT PDS AB 1 CT 36 (SUTURE) ×2 IMPLANT
SUT VIC AB 0 CT1 27 (SUTURE)
SUT VIC AB 0 CT1 27XBRD ANBCTR (SUTURE) IMPLANT
SUT VIC AB 1 CTX 36 (SUTURE)
SUT VIC AB 1 CTX36XBRD ANBCTR (SUTURE) IMPLANT
TIP HIGH FLOW IRRIGATION COAX (MISCELLANEOUS) ×3 IMPLANT
TOWEL OR 17X24 6PK STRL BLUE (TOWEL DISPOSABLE) IMPLANT
TOWEL OR 17X26 10 PK STRL BLUE (TOWEL DISPOSABLE) IMPLANT
TRAY FOLEY BAG SILVER LF 14FR (CATHETERS) IMPLANT

## 2016-05-04 NOTE — Anesthesia Preprocedure Evaluation (Signed)
Anesthesia Evaluation  Patient identified by MRN, date of birth, ID band Patient awake    Reviewed: Allergy & Precautions, NPO status , Patient's Chart, lab work & pertinent test results  History of Anesthesia Complications Negative for: history of anesthetic complications  Airway Mallampati: III  TM Distance: >3 FB Neck ROM: Full    Dental  (+) Teeth Intact, Dental Advisory Given   Pulmonary COPD, former smoker,    Pulmonary exam normal        Cardiovascular hypertension, Pt. on medications Normal cardiovascular exam+ dysrhythmias      Neuro/Psych PSYCHIATRIC DISORDERS Depression CVA, No Residual Symptoms    GI/Hepatic Neg liver ROS, GERD  ,  Endo/Other  diabetesMorbid obesity  Renal/GU negative Renal ROS     Musculoskeletal   Abdominal   Peds  Hematology   Anesthesia Other Findings   Reproductive/Obstetrics                             Anesthesia Physical Anesthesia Plan  ASA: III  Anesthesia Plan: MAC and Spinal   Post-op Pain Management:    Induction:   Airway Management Planned: Simple Face Mask and Natural Airway  Additional Equipment:   Intra-op Plan:   Post-operative Plan:   Informed Consent: I have reviewed the patients History and Physical, chart, labs and discussed the procedure including the risks, benefits and alternatives for the proposed anesthesia with the patient or authorized representative who has indicated his/her understanding and acceptance.   Dental advisory given  Plan Discussed with: CRNA and Anesthesiologist  Anesthesia Plan Comments:         Anesthesia Quick Evaluation

## 2016-05-04 NOTE — Consult Note (Addendum)
[facility]  Total days of antibiotics 1        Day 1 vanco       Reason for Consult: deep tissue infection with recent partial PJI Referring Physician: murphy  Active Problems:   Wound dehiscence    HPI: Willie Pineda is a 70 y.o. male, retired Therapist, sports, with past med hx of COPD, CHF with EF 50% , GERD, HTN, hx of brain stem stroke and right knee OA, failed medical management and underwent partial knee replacement on 5/2. Initially did well but noticed "pop" during physical therapy with wound dehiscence roughly 10 days post op. He noted that initially fluid was serosanginous but became clearer over time since starting abtx. He noticed that he had increasing warthm ,swelling and erythema to the incision. He underwent arthrocentesis in the office with only enough fluid to be sent for culture and gram stain which were reported to be NGTD. Blood work showed some increase in inflammatory markers that was attributed to post-op changes. He was tried initially on bactrim then switched to cephalexin due to intolerance. Dr. Percell Miller admitted on 5/30 for washout,capsular repair and poly-exchange. tissuse sent for culture. Patient denies any pain on weight bearing on right knee or subjected fever or chills. He was placed on vancomycin post operatively.he reports anaphylaxis to penicillin as a 70yo and not exposed, does tolerate cephalosporins  Past Medical History  Diagnosis Date  . Hyperlipidemia     takes Atorvastatin daily  . GERD (gastroesophageal reflux disease)     takes Omeprazole daily  . Depression     takes Zoloft daily  . Hypertension     takes Metoprolol,HCTZ,and Losartan daily.Takes Clonidine if needed  . Peripheral neuropathy (HCC)     takes Gabapentin daily  . Dysrhythmia     A Fib-takes Xarelto daily  . CHF (congestive heart failure) (HCC)     takes Lasix daily  . 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 Stroke [facility]) 4 1/2  yrs ago  . COPD (chronic obstructive pulmonary disease) (HCC)     inhalers as needed  . Pneumonia     hx of  . History of bronchitis   . Restless leg   . Arthritis   . Back pain     age related  . Nocturia   . Enlarged prostate     benign  . Diabetes mellitus without complication ([place])     borderline but doesn't take any meds  . Glaucoma     mild  . Complication of anesthesia     bronchial spasms for about 6 wks after being intubated    Allergies:  Allergies  Allergen Reactions  . Penicillins Anaphylaxis  . Dymista [Azelastine-Fluticasone] Other (See Comments)    Burning sensation   . Hydrocodone Other (See Comments)    SWEATING  . Spironolactone Other (See Comments)    Makes chest swelling - or as pt puts it "makes his breasts swell and sore"  . Warfarin And Related Other (See Comments)    Pt states it makes his I & R critical levels   MEDICATIONS: . chlorhexidine  60 mL Topical Once    Social History  Substance Use Topics  . Smoking status: Former Research scientist (life sciences)  . Smokeless tobacco: None     Comment: quit smoking almost 5 yrs ago  . Alcohol Use: 0.6 - 1.2 oz/week    1-2 Glasses of  wine per week     Comment: occasionally    Family hx: hypertension, heart disease.   Review of Systems  Constitutional: Negative for fever, chills, diaphoresis, activity change, appetite change, fatigue and unexpected weight change.  HENT: Negative for congestion, sore throat, rhinorrhea, sneezing, trouble swallowing and sinus pressure.  Eyes: Negative for photophobia and visual disturbance.  Respiratory: Negative for cough, chest tightness, shortness of breath, wheezing and stridor.  Cardiovascular: Negative for chest pain, palpitations and leg swelling.  Gastrointestinal: Negative for nausea, vomiting, abdominal pain, diarrhea, constipation, blood in stool, abdominal distention and anal bleeding.  Genitourinary: Negative for dysuria, hematuria, flank pain and difficulty urinating.    Musculoskeletal: right knee drainage, and redness. Negative for myalgias, back pain, joint swelling, arthralgias and gait problem.  Skin: Negative for color change, pallor, rash and wound.  Neurological: Negative for dizziness, tremors, weakness and light-headedness.  Hematological: Negative for adenopathy. Does not bruise/bleed easily.  Psychiatric/Behavioral: Negative for behavioral problems, confusion, sleep disturbance, dysphoric mood, decreased concentration and agitation.     OBJECTIVE: Temp:  [97.9 F (36.6 C)-98.2 F (36.8 C)] 97.9 F (36.6 C) (05/30 1621) Pulse Rate:  [31-59] 31 (05/30 1622) Resp:  [16-18] 16 (05/30 1622) BP: (94-131)/(57-87) 94/57 mmHg (05/30 1622) SpO2:  [93 %-100 %] 100 % (05/30 1622) Weight:  [299 lb 7 oz (135.824 kg)] 299 lb 7 oz (135.824 kg) (05/30 1222) Physical Exam  Constitutional: He is oriented to person, place, and time. He appears well-developed and well-nourished. No distress.  HENT:  Mouth/Throat: Oropharynx is clear and moist. No oropharyngeal exudate.  Cardiovascular: Normal rate, regular rhythm and normal heart sounds. Exam reveals no gallop and no friction rub.  No murmur heard.  Pulmonary/Chest: Effort normal and breath sounds normal. No respiratory distress. He has no wheezes.  Abdominal: Soft. Bowel sounds are normal. He exhibits no distension. There is no tenderness.  Lymphadenopathy:  He has no cervical adenopathy.  Ext: right leg wrapped from surgery Neurological: He is alert and oriented to person, place, and time.  Skin: Skin is warm and dry. No rash noted. No erythema.  Psychiatric: He has a normal mood and affect. His behavior is normal.    LABS: Results for orders placed or performed during the hospital encounter of 05/04/16 (from the past 48 hour(s))  Glucose, capillary     Status: Abnormal   Collection Time: 05/04/16 12:29 PM  Result Value Ref Range   Glucose-Capillary 157 (H) 65 - 99 mg/dL  Basic metabolic panel      Status: Abnormal   Collection Time: 05/04/16 12:40 PM  Result Value Ref Range   Sodium 138 135 - 145 mmol/L   Potassium 3.4 (L) 3.5 - 5.1 mmol/L   Chloride 97 (L) 101 - 111 mmol/L   CO2 30 22 - 32 mmol/L   Glucose, Bld 146 (H) 65 - 99 mg/dL   BUN 17 6 - 20 mg/dL   Creatinine, Ser 1.07 0.61 - 1.24 mg/dL   Calcium 8.7 (L) 8.9 - 10.3 mg/dL   GFR calc non Af Amer >60 >60 mL/min   GFR calc Af Amer >60 >60 mL/min    Comment: (NOTE) The eGFR has been calculated using the CKD EPI equation. This calculation has not been validated in all clinical situations. eGFR's persistently <60 mL/min signify possible Chronic Kidney Disease.    Anion gap 11 5 - 15  CBC     Status: None   Collection Time: 05/04/16 12:40 PM  Result Value Ref Range  WBC 9.3 4.0 - 10.5 K/uL   RBC 4.63 4.22 - 5.81 MIL/uL   Hemoglobin 14.2 13.0 - 17.0 g/dL   HCT 44.7 39.0 - 52.0 %   MCV 96.5 78.0 - 100.0 fL   MCH 30.7 26.0 - 34.0 pg   MCHC 31.8 30.0 - 36.0 g/dL   RDW 13.3 11.5 - 15.5 %   Platelets 241 150 - 400 K/uL  Type and screen All Cardiac and thoracic surgeries, spinal fusions, myomectomies, craniotomies, colon & liver resections, total joint revisions, same day c-section with placenta previa or accreta.     Status: None   Collection Time: 05/04/16 12:40 PM  Result Value Ref Range   ABO/RH(D) O POS    Antibody Screen NEG    Sample Expiration 05/07/2016   Protime-INR     Status: None   Collection Time: 05/04/16 12:40 PM  Result Value Ref Range   Prothrombin Time 14.6 11.6 - 15.2 seconds   INR 1.12 0.00 - 1.49  Aerobic/Anaerobic Culture(surg specimen) (NOT AT [facility])     Status: None (Preliminary result)   Collection Time: 05/04/16  3:43 PM  Result Value Ref Range   Specimen Description TISSUE RIGHT KNEE    Special Requests PATIENT ON FOLLOWING VANC AND KEFLEX    Gram Stain      FEW WBC PRESENT,BOTH PMN AND MONONUCLEAR NO ORGANISMS SEEN    Culture PENDING    Report Status PENDING   Glucose, capillary      Status: Abnormal   Collection Time: 05/04/16  4:26 PM  Result Value Ref Range   Glucose-Capillary 121 (H) 65 - 99 mg/dL  No results found for: ESRSEDRATE, POCTSEDRATE   MICRO: mssa colonized 5/30 culture pending  Assessment/Plan:  70yo M with recent partial prosthetic joint infection who had wound dehiscence concern for early deep tissue infection/pji  - will get picc line for 4 wk of vancomycin - will follow culture results to see if anything identified. He was on cephalexin prior to surgery so cx may be sterile - will check sed rate and crp  History of CHF on diuretics = will need to carefully monitor kidney function while on vancomycin as well  - spoke with Dr. Percell [facility] and [facility].  Home health orders  Diagnosis: Deep tissue infection   Culture Result: ngtd  Allergies  Allergen Reactions  . Penicillins Anaphylaxis  . Dymista [Azelastine-Fluticasone] Other (See Comments)    Burning sensation   . Hydrocodone Other (See Comments)    SWEATING  . Spironolactone Other (See Comments)    Makes chest swelling - or as pt puts it "makes his breasts swell and sore"  . Warfarin And Related Other (See Comments)    Pt states it makes his INR critical levels    Discharge antibiotics: Per pharmacy protocol  Aim for Vancomycin trough 15-20 (unless otherwise indicated) Duration: 4 wk End Date: [id number]  [address] [facility] Care Per Protocol:  Labs weekly while on IV antibiotics: __x CBC with differential _x_ BMP twice a week _x_ CRP _x_ ESR _x_ Vancomycin trough  Fax weekly labs to [phone]  Clinic Follow Up Appt: 4 wk with Dr. Baxter Flattery  @ [facility]

## 2016-05-04 NOTE — Anesthesia Procedure Notes (Addendum)
Anesthesia Regional Block:  Adductor canal block  Pre-Anesthetic Checklist: ,, timeout performed, Correct Patient, Correct Site, Correct Laterality, Correct Procedure, Correct Position, site marked, Risks and benefits discussed,  Surgical consent,  Pre-op evaluation,  At surgeon's request and post-op pain management  Laterality: Right  Prep: chloraprep       Needles:  Injection technique: Single-shot  Needle Type: Stimulator Needle - 80     Needle Length: 10cm 10 cm Needle Gauge: 21 and 21 G    Additional Needles:  Procedures: ultrasound guided (picture in chart) Adductor canal block Narrative:  Start time: 05/04/2016 2:31 PM End time: 05/04/2016 2:36 PM Injection made incrementally with aspirations every 5 mL.  Performed by: Personally    Spinal Patient location during procedure: OR Start time: 05/04/2016 2:44 PM End time: 05/04/2016 2:53 PM Staffing Anesthesiologist: Duane Boston Performed by: anesthesiologist  Preanesthetic Checklist Completed: patient identified, surgical consent, pre-op evaluation, timeout performed, IV checked, risks and benefits discussed and monitors and equipment checked Spinal Block Patient position: sitting Prep: DuraPrep Patient monitoring: cardiac monitor, continuous pulse ox and blood pressure Approach: midline Location: L3-4 Injection technique: single-shot Needle Needle type: Pencan  Needle gauge: 24 G Needle length: 9 cm Additional Notes Functioning IV was confirmed and monitors were applied. Sterile prep and drape, including hand hygiene and sterile gloves were used. The patient was positioned and the spine was prepped. The skin was anesthetized with lidocaine.  Free flow of clear CSF was obtained prior to injecting local anesthetic into the CSF.  The spinal needle aspirated freely following injection.  The needle was carefully withdrawn.  The patient tolerated the procedure well.

## 2016-05-04 NOTE — Progress Notes (Signed)
Peripherally Inserted Central Catheter/Midline Placement  The IV Nurse has discussed with the patient and/or persons authorized to consent for the patient, the purpose of this procedure and the potential benefits and risks involved with this procedure.  The benefits include less needle sticks, lab draws from the catheter and patient may be discharged home with the catheter.  Risks include, but not limited to, infection, bleeding, blood clot (thrombus formation), and puncture of an artery; nerve damage and irregular heat beat.  Alternatives to this procedure were also discussed.  PICC/Midline Placement Documentation  PICC Single Lumen [id number] PICC Left Cephalic 53 cm 0.5 cm (Active)  Indication for Insertion or Continuance of Line Home intravenous therapies (PICC only) 05/04/2016  8:50 PM  Exposed Catheter (cm) 0.5 cm 05/04/2016  8:50 PM  Site Assessment Clean;Dry;Intact 05/04/2016  8:50 PM  Line Status Blood return noted;Flushed;Saline locked 05/04/2016  8:50 PM  Dressing Type Transparent 05/04/2016  8:50 PM  Dressing Status Clean;Dry;Intact;Antimicrobial disc in place 05/04/2016  8:50 PM  Dressing Intervention New dressing 05/04/2016  8:50 PM  Dressing Change Due 05/11/16 05/04/2016  8:50 PM       Aldona Lento L 05/04/2016, 8:56 PM

## 2016-05-04 NOTE — Interval H&P Note (Signed)
History and Physical Interval Note:  05/04/2016 12:36 PM  Willie Pineda  has presented today for surgery, with the diagnosis of INFECTED RT UNI KNEE  The various methods of treatment have been discussed with the patient and family. After consideration of risks, benefits and other options for treatment, the patient has consented to  Procedure(s): TOTAL KNEE REVISION (Right) as a surgical intervention .  The patient's history has been reviewed, patient examined, no change in status, stable for surgery.  I have reviewed the patient's chart and labs.  Questions were answered to the patient's satisfaction.     Clennon Nasca D

## 2016-05-04 NOTE — Transfer of Care (Signed)
Immediate Anesthesia Transfer of Care Note  Patient: Willie Pineda  Procedure(s) Performed: Procedure(s): IRRIGATION AND DEBRIDEMENT Right KNEE WITH UNICOMPARTMENTAL POLY EXCHANGE (Right)  Patient Location: PACU  Anesthesia Type:MAC, Regional and Spinal  Level of Consciousness: awake, alert , oriented and patient cooperative  Airway & Oxygen Therapy: Patient Spontanous Breathing and Patient connected to face mask oxygen  Post-op Assessment: Report given to RN and Post -op Vital signs reviewed and stable  Post vital signs: Reviewed and stable  Last Vitals:  Filed Vitals:   05/04/16 1222  BP: 131/87  Pulse: 59  Temp: 36.8 C  Resp: 18    Last Pain:  Filed Vitals:   05/04/16 1623  PainSc: 6       Patients Stated Pain Goal: 2 ([id number])  Complications: No apparent anesthesia complications

## 2016-05-04 NOTE — Progress Notes (Signed)
Pharmacy Antibiotic Note  Willie Pineda is a 70 y.o. male admitted on 05/04/2016 with an infected Rt Uni knee. He had previously underwent a partiall knee replacement on 04/06/16.  Then had arthrocentesis done in the MD office.  He was tried initially on bactrim then switched to cephalexin due to intolerance.  Today pharmacy has been consulted for Vancomycin IV dosing for wound infection s/p I&D Rt knee with unicompartmental polyexchange on 05/04/16.  Tissue sent for culture.  He has a PCN allergy=anaphylaxis Vancomycin 1500 mg IV given preop today @ 12:45 PM.   CrCl ~91 ml/min Obese male, TBW = 135.8 kg, BMI = 40.7  Plan: Vancomycin 1250 mg IV q12h Monitor clinical status, renal function, culture results, vanc trough at steady state.     Height: 6' (182.9 cm) Weight: 299 lb 7 oz (135.824 kg) IBW/kg (Calculated) : 77.6  Temp (24hrs), Avg:97.9 F (36.6 C), Min:97.6 F (36.4 C), Max:98.2 F (36.8 C)   Recent Labs Lab 05/04/16 1240  WBC 9.3  CREATININE 1.07    Estimated Creatinine Clearance: 91.7 mL/min (by C-G formula based on Cr of 1.07).    Allergies  Allergen Reactions  . Penicillins Anaphylaxis  . Dymista [Azelastine-Fluticasone] Other (See Comments)    Burning sensation   . Hydrocodone Other (See Comments)    SWEATING  . Spironolactone Other (See Comments)    Makes chest swelling - or as pt puts it "makes his breasts swell and sore"  . Warfarin And Related Other (See Comments)    Pt states it makes his I & R critical levels    Antimicrobials this admission: Vancomycin 5/30 >>   Dose adjustments this admission: None at this time  Microbiology results: 5/30 Tissue rt knee Cx:   Thank you for allowing pharmacy to be a part of this patient's care. Nicole Cella, RPh Clinical Pharmacist Pager: [phone] 05/04/2016 6:21 PM

## 2016-05-04 NOTE — H&P (View-Only) (Signed)
Patient seen on rounds. Discharge plan is to return to home with wife and go to [place]. He prefers no home health. His OPPT appointment is scheduled for Friday. His follow up appointment with Dr. Percell Miller is 04/21/16 @ 930. I have ordered a standard rolling walker for him from [facility] to be delivered to the room prior to discharge. He will have Xarelto for anticoagulation and he has been on this pre-op.   Ladell Heads, RN BPCI CM  [place]

## 2016-05-04 NOTE — Op Note (Signed)
05/04/2016  4:01 PM  PATIENT:  Willie Pineda    PRE-OPERATIVE DIAGNOSIS:  INFECTED RIGHT UNI KNEE  POST-OPERATIVE DIAGNOSIS:  Same  PROCEDURE:  IRRIGATION AND DEBRIDEMENT Right KNEE WITH UNICOMPARTMENTAL POLY EXCHANGE  SURGEON:  Michaela Shankel D, MD  ASSISTANT: none  ANESTHESIA:   spinal  PREOPERATIVE INDICATIONS:  Willie Pineda is a  70 y.o. male with a diagnosis of INFECTED RIGHT UNI KNEE who failed conservative measures and elected for surgical management.    The risks benefits and alternatives were discussed with the patient preoperatively including but not limited to the risks of infection, bleeding, nerve injury, cardiopulmonary complications, the need for revision surgery, among others, and the patient was willing to proceed.  OPERATIVE IMPLANTS: 3 poly swap  OPERATIVE FINDINGS: Rupture of his medial capsular repair.  No signs of purulence  BLOOD LOSS: 40  COMPLICATIONS: none  TOURNIQUET TIME: none  OPERATIVE PROCEDURE:  Patient was identified in the preoperative holding area and site was marked by me He was transported to the operating theater and placed on the table in supine position taking care to pad all bony prominences. After a preincinduction time out anesthesia was induced. The right lower extremity was prepped and draped in normal sterile fashion and a pre-incision timeout was performed. He received vanc for preoperative antibiotics.   I opened his incision proximal and distally from the dehisced portion.  A total samples and sent this to Dilaudid for culture.  I then explored his wound he had ruptured his medial capsular repair just above the joint line.  It was well-healed proximally and distally.  There was no sign of purulence.  I performed an irrigation with 3 L.  We then exchanged out all surgical instruments changed gloves and converted to a clean portion of the case  Given the communication between the joint and his dehisced wound elected to  perform a poly-swap with irrigation and debridement of his knee.  I extended the capsular incision proximally and distally removing all Vicryl stitches.  I removed the poly-component irrigated the knee with another 3 L did not find any purulent necrotic or devitalized tissue.  I then trialed and replaced a size 3 polyethylene component tracked very well.   I performed another thorough irrigation and then used PDS #1 suture to close his capsule and was very happy with the repair here.  I then used a 3-0 nylon to close the skin.  POST OPERATIVE PLAN: PICC line ID consult I will keep his knee in extension for 1 week he will resume his around the prednisone dose    This note was generated using a template and dragon dictation system. In light of that, I have reviewed the note and all aspects of it are applicable to this case. Any dictation errors are due to the computerized dictation system.

## 2016-05-05 ENCOUNTER — Encounter (HOSPITAL_COMMUNITY): Payer: Self-pay | Admitting: General Practice

## 2016-05-05 DIAGNOSIS — T8130XA Disruption of wound, unspecified, initial encounter: Secondary | ICD-10-CM | POA: Diagnosis not present

## 2016-05-05 DIAGNOSIS — T8131XA Disruption of external operation (surgical) wound, not elsewhere classified, initial encounter: Secondary | ICD-10-CM | POA: Diagnosis not present

## 2016-05-05 DIAGNOSIS — K219 Gastro-esophageal reflux disease without esophagitis: Secondary | ICD-10-CM | POA: Diagnosis not present

## 2016-05-05 DIAGNOSIS — J449 Chronic obstructive pulmonary disease, unspecified: Secondary | ICD-10-CM | POA: Diagnosis not present

## 2016-05-05 DIAGNOSIS — F329 Major depressive disorder, single episode, unspecified: Secondary | ICD-10-CM | POA: Diagnosis not present

## 2016-05-05 DIAGNOSIS — T8453XA Infection and inflammatory reaction due to internal right knee prosthesis, initial encounter: Secondary | ICD-10-CM | POA: Diagnosis not present

## 2016-05-05 DIAGNOSIS — E1142 Type 2 diabetes mellitus with diabetic polyneuropathy: Secondary | ICD-10-CM | POA: Diagnosis not present

## 2016-05-05 DIAGNOSIS — I110 Hypertensive heart disease with heart failure: Secondary | ICD-10-CM | POA: Diagnosis not present

## 2016-05-05 DIAGNOSIS — I509 Heart failure, unspecified: Secondary | ICD-10-CM | POA: Diagnosis not present

## 2016-05-05 DIAGNOSIS — Z452 Encounter for adjustment and management of vascular access device: Secondary | ICD-10-CM | POA: Diagnosis not present

## 2016-05-05 LAB — HEMOGLOBIN A1C
Hgb A1c MFr Bld: 7 % — ABNORMAL HIGH (ref 4.8–5.6)
Mean Plasma Glucose: 154 mg/dL

## 2016-05-05 LAB — CBC WITH DIFFERENTIAL/PLATELET
Basophils Absolute: 0 10*3/uL (ref 0.0–0.1)
Basophils Relative: 0 %
Eosinophils Absolute: 0.3 10*3/uL (ref 0.0–0.7)
Eosinophils Relative: 3 %
HCT: 38.7 % — ABNORMAL LOW (ref 39.0–52.0)
Hemoglobin: 12.2 g/dL — ABNORMAL LOW (ref 13.0–17.0)
Lymphocytes Relative: 25 %
Lymphs Abs: 2.4 10*3/uL (ref 0.7–4.0)
MCH: 30.3 pg (ref 26.0–34.0)
MCHC: 31.5 g/dL (ref 30.0–36.0)
MCV: 96.3 fL (ref 78.0–100.0)
Monocytes Absolute: 0.8 10*3/uL (ref 0.1–1.0)
Monocytes Relative: 8 %
Neutro Abs: 6.3 10*3/uL (ref 1.7–7.7)
Neutrophils Relative %: 64 %
Platelets: 229 10*3/uL (ref 150–400)
RBC: 4.02 MIL/uL — ABNORMAL LOW (ref 4.22–5.81)
RDW: 13.1 % (ref 11.5–15.5)
WBC: 9.9 10*3/uL (ref 4.0–10.5)

## 2016-05-05 LAB — BASIC METABOLIC PANEL
Anion gap: 8 (ref 5–15)
BUN: 17 mg/dL (ref 6–20)
CO2: 32 mmol/L (ref 22–32)
Calcium: 8.4 mg/dL — ABNORMAL LOW (ref 8.9–10.3)
Chloride: 96 mmol/L — ABNORMAL LOW (ref 101–111)
Creatinine, Ser: 1.01 mg/dL (ref 0.61–1.24)
GFR calc Af Amer: >60 mL/min (ref >60)
GFR calc non Af Amer: >60 mL/min (ref >60)
Glucose, Bld: 106 mg/dL — ABNORMAL HIGH (ref 65–99)
Potassium: 3.3 mmol/L — ABNORMAL LOW (ref 3.5–5.1)
Sodium: 136 mmol/L (ref 135–145)

## 2016-05-05 LAB — GLUCOSE, CAPILLARY
Glucose-Capillary: 174 mg/dL — ABNORMAL HIGH (ref 65–99)
Glucose-Capillary: 108 mg/dL — ABNORMAL HIGH (ref 65–99)
Glucose-Capillary: 191 mg/dL — ABNORMAL HIGH (ref 65–99)

## 2016-05-05 LAB — C-REACTIVE PROTEIN: CRP: 2.1 mg/dL — ABNORMAL HIGH (ref <1.0)

## 2016-05-05 LAB — SEDIMENTATION RATE: Sed Rate: 35 mm/hr — ABNORMAL HIGH (ref 0–16)

## 2016-05-05 MED ORDER — OXYCODONE-ACETAMINOPHEN 5-325 MG PO TABS: 2.0000 | ORAL_TABLET | ORAL | Status: DC | PRN

## 2016-05-05 MED ORDER — HEPARIN SOD (PORK) LOCK FLUSH 100 UNIT/ML IV SOLN
250.0000 [IU] | INTRAVENOUS | Status: AC | PRN
  Administered 2016-05-05: 250 [IU]

## 2016-05-05 NOTE — Discharge Instructions (Signed)
Keep dressing clean and dry  Keep leg straight  No PT for one wk

## 2016-05-05 NOTE — Progress Notes (Signed)
Pt ready for discharge to home via pvt auto with wife at side. Pt to receive [facility] services for physical therapy and nursing. No distress noted. All personal belongings with pt.

## 2016-05-05 NOTE — Care Management Obs Status (Signed)
[facility] NOTIFICATION   Patient Details  Name: Willie Pineda MRN: [id number] Date of Birth: [birth year]   Medicare Observation Status Notification Given:  Yes    Ninfa Meeker, RN 05/05/2016, 3:28 PM

## 2016-05-05 NOTE — Discharge Summary (Signed)
Physician Discharge Summary  Patient ID: Willie Pineda MRN: [id number] DOB/AGE: 05/20/1946 70 y.o.  Admit date: 05/04/2016 Discharge date: 05/05/2016  Admission Diagnoses:  <principal problem not specified>  Discharge Diagnoses:  Active Problems:   Wound dehiscence   Past Medical History  Diagnosis Date  . Hyperlipidemia     takes Atorvastatin daily  . GERD (gastroesophageal reflux disease)     takes Omeprazole daily  . Depression     takes Zoloft daily  . Hypertension     takes Metoprolol,HCTZ,and Losartan daily.Takes Clonidine if needed  . Peripheral neuropathy (HCC)     takes Gabapentin daily  . Dysrhythmia     A Fib-takes Xarelto daily  . CHF (congestive heart failure) (HCC)     takes Lasix daily  . 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 COPD (chronic obstructive pulmonary disease) (HCC)     inhalers as needed  . Restless leg   . Back pain     age related  . Nocturia   . Enlarged prostate     benign  . Glaucoma     mild  . Stroke [facility]) 2012    denies residual on 05/05/2016  . Complication of anesthesia     bronchial spasms in ~ 2015; 2016; 2017  . Atrial fibrillation ([place])   . Pneumonia ?2015  . Type II diabetes mellitus ([place])     borderline but doesn't take any meds (05/05/2016)  . Arthritis     "hands primarily" (05/05/2016)    Surgeries: Procedure(s): IRRIGATION AND DEBRIDEMENT Right KNEE WITH UNICOMPARTMENTAL POLY EXCHANGE on 05/04/2016   Consultants (if any):    Discharged Condition: Improved  Hospital Course: Willie Pineda is an 70 y.o. male who was admitted 05/04/2016 with a diagnosis of <principal problem not specified> and went to the operating room on 05/04/2016 and underwent the above named procedures.    He was given perioperative antibiotics:  Anti-infectives    Start     Dose/Rate Route Frequency Ordered Stop   05/05/16 0045  vancomycin (VANCOCIN) IVPB 1000 mg/200 mL premix  Status:  Discontinued      1,000 mg 200 mL/hr over 60 Minutes Intravenous Every 12 hours 05/04/16 1757 05/04/16 1819   05/05/16 0030  vancomycin (VANCOCIN) 1,250 mg in sodium chloride 0.9 % 250 mL IVPB     1,250 mg 166.7 mL/hr over 90 Minutes Intravenous Every 12 hours 05/04/16 1834     05/04/16 1330  vancomycin (VANCOCIN) 1,500 mg in sodium chloride 0.9 % 500 mL IVPB     1,500 mg 250 mL/hr over 120 Minutes Intravenous On call to O.R. 05/04/16 [id number] 05/04/16 1445    .  He was given sequential compression devices, early ambulation, and Xarelto for DVT prophylaxis.  He benefited maximally from the hospital stay and there were no complications.    Recent vital signs:  Filed Vitals:   05/05/16 0030 05/05/16 0454  BP: 113/80 114/63  Pulse: 68 53  Temp: 97.8 F (36.6 C) 97.7 F (36.5 C)  Resp: 16 16    Recent laboratory studies:  Lab Results  Component Value Date   HGB 12.2* 05/05/2016   HGB 14.2 05/04/2016   HGB 15.5 03/25/2016   Lab Results  Component Value Date   WBC 9.9 05/05/2016   PLT 229 05/05/2016   Lab Results  Component Value Date   INR 1.12 05/04/2016   Lab Results  Component Value Date  NA 136 05/05/2016   K 3.3* 05/05/2016   CL 96* 05/05/2016   CO2 32 05/05/2016   BUN 17 05/05/2016   CREATININE 1.01 05/05/2016   GLUCOSE 106* 05/05/2016    Discharge Medications:     Medication List    ASK your doctor about these medications        albuterol 108 (90 Base) MCG/ACT inhaler  Commonly known as:  PROVENTIL HFA;VENTOLIN HFA  Inhale 1-2 puffs into the lungs every 4 (four) hours as needed for wheezing or shortness of breath.     amiodarone 200 MG tablet  Commonly known as:  PACERONE  Take 200 mg by mouth daily.     atorvastatin 20 MG tablet  Commonly known as:  LIPITOR  Take 20 mg by mouth daily.     cloNIDine 0.1 MG tablet  Commonly known as:  CATAPRES  Take 0.1 mg by mouth daily as needed (Only takes if pt if bp  > 160).     docusate sodium 100 MG capsule  Commonly  known as:  COLACE  Take 1 capsule (100 mg total) by mouth 2 (two) times daily. Continue this while taking narcotics to help with bowel movements     furosemide 80 MG tablet  Commonly known as:  LASIX  Take 80 mg by mouth 2 (two) times daily.     gabapentin 300 MG capsule  Commonly known as:  NEURONTIN  Take 600 mg by mouth at bedtime.     hydrochlorothiazide 25 MG tablet  Commonly known as:  HYDRODIURIL  Take 25 mg by mouth daily.     latanoprost 0.005 % ophthalmic solution  Commonly known as:  XALATAN  Place 1 drop into both eyes at bedtime.     loratadine 10 MG tablet  Commonly known as:  CLARITIN  Take 10 mg by mouth daily.     losartan 100 MG tablet  Commonly known as:  COZAAR  Take 100 mg by mouth daily.     methocarbamol 500 MG tablet  Commonly known as:  ROBAXIN  Take 1 tablet (500 mg total) by mouth every 6 (six) hours as needed.     metoprolol succinate 50 MG 24 hr tablet  Commonly known as:  TOPROL-XL  Take 50 mg by mouth daily. Take with or immediately following a meal.     omeprazole 20 MG capsule  Commonly known as:  PRILOSEC  Take 20 mg by mouth daily.     ondansetron 4 MG tablet  Commonly known as:  ZOFRAN  Take 1 tablet (4 mg total) by mouth every 8 (eight) hours as needed for nausea.     oxyCODONE-acetaminophen 5-325 MG tablet  Commonly known as:  ROXICET  Take 2 tablets by mouth every 4 (four) hours as needed.     potassium chloride 20 MEQ packet  Commonly known as:  KLOR-CON  Take 10 mEq by mouth 2 (two) times daily.     PRESERVISION AREDS 2 PO  Take 1 capsule by mouth 2 (two) times daily.     rivaroxaban 20 MG Tabs tablet  Commonly known as:  XARELTO  Take 20 mg by mouth daily with supper.     sertraline 100 MG tablet  Commonly known as:  ZOLOFT  Take 100 mg by mouth daily.     tiotropium 18 MCG inhalation capsule  Commonly known as:  SPIRIVA  Place 18 mcg into inhaler and inhale daily as needed (shortness of breath/wheezing).  Diagnostic Studies: Dg Knee 1-2 Views Right  04/07/2016  CLINICAL DATA:  Postop knee surgery. EXAM: RIGHT KNEE - 1-2 VIEW COMPARISON:  None. FINDINGS: Medial compartment arthroplasty. Subcutaneous air and edema in the overlying soft tissues. Patellofemoral and lateral compartment osteophytosis. IMPRESSION: 1. Medial compartment arthroplasty with expected postoperative findings. 2. Patellofemoral and lateral compartment osteophytosis. Electronically Signed   By: Lorin Picket M.D.   On: 04/07/2016 08:41   Dg Chest Port 1 View  05/04/2016  CLINICAL DATA:  Central catheter placement EXAM: PORTABLE CHEST 1 VIEW COMPARISON:  None. FINDINGS: Central catheter tip is at the cavoatrial junction. No pneumothorax. There is no edema or consolidation. Heart is mildly enlarged with pulmonary vascularity within normal limits. No adenopathy. IMPRESSION: Central catheter tip at cavoatrial junction. No pneumothorax. No edema or consolidation. Mild cardiac enlargement. Electronically Signed   By: Lowella Grip III M.D.   On: 05/04/2016 21:49    Disposition: 01-Home or Self Care       Signed: Renette Butters 05/05/2016, 7:43 AM

## 2016-05-05 NOTE — Progress Notes (Signed)
OT Cancellation Note  Patient Details Name: Willie Pineda MRN: [id number] DOB: 1946-02-22   Cancelled Treatment:    Reason Eval/Treat Not Completed: OT screened, no needs identified, will sign off.  Pt has recently had sx for uniknee and has DME/24/7 assistance.  No OT needs at this time.  Unsure if pt will be allowed to shower:  Reviewed shower sequence with him.  Marcille Barman 05/05/2016, 10:43 AM  Lesle Chris, OTR/L [phone] 05/05/2016

## 2016-05-05 NOTE — Progress Notes (Signed)
Mr. Fuhrmeister will discharge to home with his wife and [facility] for IV antibiotics. He has a L PICC line in place. All orders have been given to [facility] for referral and explained to patient. He and his wife are in agreement with plan.  Questions answered. BPCI CM will continue to follow for assistance.   Ladell Heads, [place]  [facility]  [phone]

## 2016-05-05 NOTE — Evaluation (Signed)
Physical Therapy Evaluation Patient Details Name: Willie Pineda MRN: [id number] DOB: Oct 12, 1946 Today's Date: 05/05/2016   History of Present Illness  Patient is a 70 y/o male admitted for [place] Right KNEE WITH UNICOMPARTMENTAL POLY EXCHANGE PMH: smoker, GERD, HLD, HTN, afib/PAF, non-ischemic cardiomyopathy (by cardiology notes; felt to be tachycardia-induced), CHF, peripheral neuropathy, depression, glaucoma, DM2 (borderline), BPH, RLS, COPD, CVA X 3   Clinical Impression  Pt moving very well with PT. Pt states that he is hoping to go home as soon as possible. Pt denies any questions or concerns. PT to follow and assist with transition to home.     Follow Up Recommendations Outpatient PT;Supervision - Intermittent    Equipment Recommendations  None recommended by PT    Recommendations for Other Services       Precautions / Restrictions Precautions Precautions: None Required Braces or Orthoses: Knee Immobilizer - Right Knee Immobilizer - Right: On at all times Restrictions Weight Bearing Restrictions: Yes RLE Weight Bearing: Weight bearing as tolerated      Mobility  Bed Mobility Overal bed mobility: Independent                Transfers Overall transfer level: Independent Equipment used: None             General transfer comment: safe technique  Ambulation/Gait Ambulation/Gait assistance: Supervision Ambulation Distance (Feet): 150 Feet Assistive device: Rolling walker (2 wheeled) Gait Pattern/deviations: Step-through pattern     General Gait Details: no loss of balance  Stairs            Wheelchair Mobility    Modified Rankin (Stroke Patients Only)       Balance Overall balance assessment: Needs assistance Sitting-balance support: No upper extremity supported Sitting balance-Leahy Scale: Good     Standing balance support: During functional activity Standing balance-Leahy Scale: Fair Standing balance comment:  using rw during ambulation                             Pertinent Vitals/Pain Pain Assessment: Faces Faces Pain Scale: Hurts a little bit Pain Location: Rt knee Pain Descriptors / Indicators: Sore Pain Intervention(s): Monitored during session    Home Living Family/patient expects to be discharged to:: Private residence Living Arrangements: Spouse/significant other Available Help at Discharge: Family;Available 24 hours/day Type of Home: House Home Access: Level entry     Home Layout: One level Home Equipment: Walker - 2 wheels;Bedside commode;Shower seat      Prior Function Level of Independence: Independent               Hand Dominance        Extremity/Trunk Assessment   Upper Extremity Assessment: Overall WFL for tasks assessed           Lower Extremity Assessment: RLE deficits/detail RLE Deficits / Details: able to raise and move without assistance       Communication   Communication: No difficulties  Cognition Arousal/Alertness: Awake/alert Behavior During Therapy: WFL for tasks assessed/performed Overall Cognitive Status: Within Functional Limits for tasks assessed                      General Comments      Exercises        Assessment/Plan    PT Assessment Patient needs continued PT services  PT Diagnosis Difficulty walking   PT Problem List Decreased activity tolerance;Decreased balance;Decreased mobility  PT Treatment Interventions DME instruction;Gait  training;Functional mobility training;Stair training;Therapeutic activities;Therapeutic exercise;Patient/family education   PT Goals (Current goals can be found in the Care Plan section) Acute Rehab PT Goals Patient Stated Goal: go home  PT Goal Formulation: With patient Time For Goal Achievement: 05/19/16 Potential to Achieve Goals: Good    Frequency Min 3X/week   Barriers to discharge        Co-evaluation               End of Session Equipment  Utilized During Treatment:  (pt declined use of gait belt) Activity Tolerance: Patient tolerated treatment well Patient left: in bed;with call bell/phone within reach;with family/visitor present Nurse Communication: Mobility status    Functional Assessment Tool Used: clinical judgment Functional Limitation: Mobility: Walking and moving around Mobility: Walking and Moving Around Current Status [id number]): At least 1 percent but less than 20 percent impaired, limited or restricted Mobility: Walking and Moving Around Goal Status [phone]): At least 1 percent but less than 20 percent impaired, limited or restricted    Time: 1126-1137 PT Time Calculation (min) (ACUTE ONLY): 11 min   Charges:   PT Evaluation $PT Eval Moderate Complexity: 1 Procedure     PT G Codes:   PT G-Codes **NOT FOR INPATIENT CLASS** Functional Assessment Tool Used: clinical judgment Functional Limitation: Mobility: Walking and moving around Mobility: Walking and Moving Around Current Status [id number]): At least 1 percent but less than 20 percent impaired, limited or restricted Mobility: Walking and Moving Around Goal Status [phone]): At least 1 percent but less than 20 percent impaired, limited or restricted    Cassell Clement, PT, [place] Pager [phone] Office [phone]  05/05/2016, 11:46 AM

## 2016-05-06 ENCOUNTER — Encounter: Payer: Medicare Other | Admitting: Rehabilitative and Restorative Service Providers"

## 2016-05-06 ENCOUNTER — Encounter (HOSPITAL_COMMUNITY): Payer: Self-pay | Admitting: Orthopedic Surgery

## 2016-05-06 NOTE — Anesthesia Postprocedure Evaluation (Signed)
Anesthesia Post Note  Patient: Willie Pineda  Procedure(s) Performed: Procedure(s) (LRB): IRRIGATION AND DEBRIDEMENT Right KNEE WITH UNICOMPARTMENTAL POLY EXCHANGE (Right)  Patient location during evaluation: PACU Anesthesia Type: Spinal Level of consciousness: oriented and awake and alert Pain management: pain level controlled Vital Signs Assessment: post-procedure vital signs reviewed and stable Respiratory status: spontaneous breathing, respiratory function stable and patient connected to nasal cannula oxygen Cardiovascular status: blood pressure returned to baseline and stable Postop Assessment: no headache, no backache and spinal receding Anesthetic complications: no    Last Vitals:  Filed Vitals:   05/05/16 0030 05/05/16 0454  BP: 113/80 114/63  Pulse: 68 53  Temp: 36.6 C 36.5 C  Resp: 16 16    Last Pain:  Filed Vitals:   05/05/16 1005  PainSc: 2                  Effie Berkshire

## 2016-05-07 DIAGNOSIS — I509 Heart failure, unspecified: Secondary | ICD-10-CM | POA: Diagnosis not present

## 2016-05-07 DIAGNOSIS — T8453XA Infection and inflammatory reaction due to internal right knee prosthesis, initial encounter: Secondary | ICD-10-CM | POA: Diagnosis not present

## 2016-05-07 DIAGNOSIS — T8131XA Disruption of external operation (surgical) wound, not elsewhere classified, initial encounter: Secondary | ICD-10-CM | POA: Diagnosis not present

## 2016-05-07 DIAGNOSIS — E1142 Type 2 diabetes mellitus with diabetic polyneuropathy: Secondary | ICD-10-CM | POA: Diagnosis not present

## 2016-05-07 DIAGNOSIS — E878 Other disorders of electrolyte and fluid balance, not elsewhere classified: Secondary | ICD-10-CM | POA: Diagnosis not present

## 2016-05-07 DIAGNOSIS — J449 Chronic obstructive pulmonary disease, unspecified: Secondary | ICD-10-CM | POA: Diagnosis not present

## 2016-05-07 DIAGNOSIS — I110 Hypertensive heart disease with heart failure: Secondary | ICD-10-CM | POA: Diagnosis not present

## 2016-05-07 DIAGNOSIS — Z5181 Encounter for therapeutic drug level monitoring: Secondary | ICD-10-CM | POA: Diagnosis not present

## 2016-05-09 LAB — AEROBIC/ANAEROBIC CULTURE W GRAM STAIN (SURGICAL/DEEP WOUND): Culture: NO GROWTH

## 2016-05-10 DIAGNOSIS — E1142 Type 2 diabetes mellitus with diabetic polyneuropathy: Secondary | ICD-10-CM | POA: Diagnosis not present

## 2016-05-10 DIAGNOSIS — I509 Heart failure, unspecified: Secondary | ICD-10-CM | POA: Diagnosis not present

## 2016-05-10 DIAGNOSIS — I110 Hypertensive heart disease with heart failure: Secondary | ICD-10-CM | POA: Diagnosis not present

## 2016-05-10 DIAGNOSIS — T8131XA Disruption of external operation (surgical) wound, not elsewhere classified, initial encounter: Secondary | ICD-10-CM | POA: Diagnosis not present

## 2016-05-10 DIAGNOSIS — J449 Chronic obstructive pulmonary disease, unspecified: Secondary | ICD-10-CM | POA: Diagnosis not present

## 2016-05-10 DIAGNOSIS — T8453XA Infection and inflammatory reaction due to internal right knee prosthesis, initial encounter: Secondary | ICD-10-CM | POA: Diagnosis not present

## 2016-05-13 DIAGNOSIS — E1142 Type 2 diabetes mellitus with diabetic polyneuropathy: Secondary | ICD-10-CM | POA: Diagnosis not present

## 2016-05-13 DIAGNOSIS — T8453XA Infection and inflammatory reaction due to internal right knee prosthesis, initial encounter: Secondary | ICD-10-CM | POA: Diagnosis not present

## 2016-05-13 DIAGNOSIS — J449 Chronic obstructive pulmonary disease, unspecified: Secondary | ICD-10-CM | POA: Diagnosis not present

## 2016-05-13 DIAGNOSIS — I509 Heart failure, unspecified: Secondary | ICD-10-CM | POA: Diagnosis not present

## 2016-05-13 DIAGNOSIS — I110 Hypertensive heart disease with heart failure: Secondary | ICD-10-CM | POA: Diagnosis not present

## 2016-05-13 DIAGNOSIS — T8131XA Disruption of external operation (surgical) wound, not elsewhere classified, initial encounter: Secondary | ICD-10-CM | POA: Diagnosis not present

## 2016-05-17 DIAGNOSIS — I509 Heart failure, unspecified: Secondary | ICD-10-CM | POA: Diagnosis not present

## 2016-05-17 DIAGNOSIS — I110 Hypertensive heart disease with heart failure: Secondary | ICD-10-CM | POA: Diagnosis not present

## 2016-05-17 DIAGNOSIS — T8131XA Disruption of external operation (surgical) wound, not elsewhere classified, initial encounter: Secondary | ICD-10-CM | POA: Diagnosis not present

## 2016-05-17 DIAGNOSIS — T814XXD Infection following a procedure, subsequent encounter: Secondary | ICD-10-CM | POA: Diagnosis not present

## 2016-05-17 DIAGNOSIS — E1142 Type 2 diabetes mellitus with diabetic polyneuropathy: Secondary | ICD-10-CM | POA: Diagnosis not present

## 2016-05-17 DIAGNOSIS — J449 Chronic obstructive pulmonary disease, unspecified: Secondary | ICD-10-CM | POA: Diagnosis not present

## 2016-05-17 DIAGNOSIS — T8453XA Infection and inflammatory reaction due to internal right knee prosthesis, initial encounter: Secondary | ICD-10-CM | POA: Diagnosis not present

## 2016-05-20 DIAGNOSIS — E1142 Type 2 diabetes mellitus with diabetic polyneuropathy: Secondary | ICD-10-CM | POA: Diagnosis not present

## 2016-05-20 DIAGNOSIS — T8453XA Infection and inflammatory reaction due to internal right knee prosthesis, initial encounter: Secondary | ICD-10-CM | POA: Diagnosis not present

## 2016-05-20 DIAGNOSIS — T8131XA Disruption of external operation (surgical) wound, not elsewhere classified, initial encounter: Secondary | ICD-10-CM | POA: Diagnosis not present

## 2016-05-20 DIAGNOSIS — I509 Heart failure, unspecified: Secondary | ICD-10-CM | POA: Diagnosis not present

## 2016-05-20 DIAGNOSIS — I110 Hypertensive heart disease with heart failure: Secondary | ICD-10-CM | POA: Diagnosis not present

## 2016-05-20 DIAGNOSIS — J449 Chronic obstructive pulmonary disease, unspecified: Secondary | ICD-10-CM | POA: Diagnosis not present

## 2016-05-24 ENCOUNTER — Telehealth: Payer: Self-pay | Admitting: *Deleted

## 2016-05-24 DIAGNOSIS — J449 Chronic obstructive pulmonary disease, unspecified: Secondary | ICD-10-CM | POA: Diagnosis not present

## 2016-05-24 DIAGNOSIS — T8453XA Infection and inflammatory reaction due to internal right knee prosthesis, initial encounter: Secondary | ICD-10-CM | POA: Diagnosis not present

## 2016-05-24 DIAGNOSIS — I110 Hypertensive heart disease with heart failure: Secondary | ICD-10-CM | POA: Diagnosis not present

## 2016-05-24 DIAGNOSIS — T8131XD Disruption of external operation (surgical) wound, not elsewhere classified, subsequent encounter: Secondary | ICD-10-CM

## 2016-05-24 DIAGNOSIS — T8131XA Disruption of external operation (surgical) wound, not elsewhere classified, initial encounter: Secondary | ICD-10-CM | POA: Diagnosis not present

## 2016-05-24 DIAGNOSIS — I509 Heart failure, unspecified: Secondary | ICD-10-CM | POA: Diagnosis not present

## 2016-05-24 DIAGNOSIS — E1142 Type 2 diabetes mellitus with diabetic polyneuropathy: Secondary | ICD-10-CM | POA: Diagnosis not present

## 2016-05-24 DIAGNOSIS — T814XXD Infection following a procedure, subsequent encounter: Secondary | ICD-10-CM | POA: Diagnosis not present

## 2016-05-24 MED ORDER — ONDANSETRON HCL 4 MG PO TABS: 4.0000 mg | ORAL_TABLET | Freq: Three times a day (TID) | ORAL | Status: DC | PRN

## 2016-05-24 MED ORDER — FLUCONAZOLE 200 MG PO TABS: 200.0000 mg | ORAL_TABLET | Freq: Every day | ORAL | Status: DC

## 2016-05-24 MED ORDER — DOXYCYCLINE HYCLATE 100 MG PO TABS: 100.0000 mg | ORAL_TABLET | Freq: Two times a day (BID) | ORAL | Status: DC

## 2016-05-24 NOTE — Telephone Encounter (Signed)
-----   Message from Carlyle Basques, MD sent at 05/24/2016 10:53 AM EDT ----- Spoke with Dr. Percell Miller. Pt having some thrush and aki from vanco. Currently at 4 wk.  - can you have advance to stop vanco and pull picc line. Get labs of sed rate ,crp, adn bmp  - can you call patient in some fluconazole 200mg  once daily x 7 days  - have him start doxycycline 100mg  po bid x 2 wk ( have him take on full stomach)  - also give zofran 4mg  po q8hr prn to use if still nauseated from doxy  Thank you!

## 2016-05-24 NOTE — Telephone Encounter (Signed)
Patient called to report thrush and a sore throat. He advised he is not sure why he is still on IV Vanc and wants to know if it can be discontinued and if he can have something to treat his thrush. He advised he has not had anything grow and is just on it prophylacticly. He has an appt here 06/01/16 with Dr Linus Salmons. Advised him will ask the doctor and give him a call back.

## 2016-05-24 NOTE — Telephone Encounter (Signed)
Called orders to Amy at [facility] per Dr. Baxter Flattery.  Labs will be drawn today, PICC pulled today.  Called in prescriptions per Dr. Baxter Flattery to patient's pharmacy, notified patient of plans. Confirmed follow up appointment at [facility]. Landis Gandy, RN

## 2016-05-25 DIAGNOSIS — T8453XA Infection and inflammatory reaction due to internal right knee prosthesis, initial encounter: Secondary | ICD-10-CM | POA: Diagnosis not present

## 2016-05-25 DIAGNOSIS — J449 Chronic obstructive pulmonary disease, unspecified: Secondary | ICD-10-CM | POA: Diagnosis not present

## 2016-05-25 DIAGNOSIS — I110 Hypertensive heart disease with heart failure: Secondary | ICD-10-CM | POA: Diagnosis not present

## 2016-05-25 DIAGNOSIS — E1142 Type 2 diabetes mellitus with diabetic polyneuropathy: Secondary | ICD-10-CM | POA: Diagnosis not present

## 2016-05-25 DIAGNOSIS — T8131XA Disruption of external operation (surgical) wound, not elsewhere classified, initial encounter: Secondary | ICD-10-CM | POA: Diagnosis not present

## 2016-05-25 DIAGNOSIS — I509 Heart failure, unspecified: Secondary | ICD-10-CM | POA: Diagnosis not present

## 2016-06-01 ENCOUNTER — Ambulatory Visit (INDEPENDENT_AMBULATORY_CARE_PROVIDER_SITE_OTHER): Payer: Medicare Other | Admitting: Internal Medicine

## 2016-06-01 ENCOUNTER — Encounter: Payer: Self-pay | Admitting: Internal Medicine

## 2016-06-01 VITALS — BP 161/94 | HR 67 | Temp 97.8°F | Ht 72.0 in | Wt 300.0 lb

## 2016-06-01 DIAGNOSIS — T8131XD Disruption of external operation (surgical) wound, not elsewhere classified, subsequent encounter: Secondary | ICD-10-CM

## 2016-06-01 DIAGNOSIS — E669 Obesity, unspecified: Secondary | ICD-10-CM

## 2016-06-01 NOTE — Assessment & Plan Note (Signed)
Doing well.  Will complete the doxycyline and stop antibiotics.  RTC prn

## 2016-06-01 NOTE — Progress Notes (Signed)
   Subjective:    Patient ID: Willie Pineda, male    DOB: [birth year], 70 y.o.   MRN: [id number]  HPI Here for hsfu of wound dehiscence.  Finished about 3 weeks of vancomycin and stopped with increasing creat, started doxycyline for 2 weeks.  Knee doing well.  Some pain with starting to move but resolves after moving more.  No fever, no chills.  Incision nearly closed, no knee swelling, no erythema.  No complaints.  Hoping to get back to exercising.    Review of Systems  Constitutional: Negative for fever.  Skin: Negative for rash.       Objective:   Physical Exam  Constitutional: He appears well-developed and well-nourished. No distress.  Eyes: No scleral icterus.  Abdominal:  obese  Skin: No rash noted.          Assessment & Plan:

## 2016-06-01 NOTE — Assessment & Plan Note (Signed)
Patient to start exercising when able

## 2016-06-04 ENCOUNTER — Inpatient Hospital Stay: Payer: Medicare Other | Admitting: Internal Medicine

## 2016-06-09 ENCOUNTER — Encounter: Payer: Self-pay | Admitting: Internal Medicine

## 2016-06-15 DIAGNOSIS — T814XXD Infection following a procedure, subsequent encounter: Secondary | ICD-10-CM | POA: Diagnosis not present

## 2016-06-22 ENCOUNTER — Encounter: Payer: Self-pay | Admitting: Rehabilitative and Restorative Service Providers"

## 2016-06-22 ENCOUNTER — Ambulatory Visit (INDEPENDENT_AMBULATORY_CARE_PROVIDER_SITE_OTHER): Payer: Medicare Other | Admitting: Rehabilitative and Restorative Service Providers"

## 2016-06-22 DIAGNOSIS — R29898 Other symptoms and signs involving the musculoskeletal system: Secondary | ICD-10-CM | POA: Diagnosis not present

## 2016-06-22 DIAGNOSIS — M25561 Pain in right knee: Secondary | ICD-10-CM

## 2016-06-22 DIAGNOSIS — R269 Unspecified abnormalities of gait and mobility: Secondary | ICD-10-CM

## 2016-06-22 NOTE — Therapy (Addendum)
[facility], [name], [address] Phone: [phone]   Fax:  [phone]  Physical Therapy Treatment  Patient Details  Name: Willie Pineda MRN: [id number] Date of Birth: [birth year] Referring Provider: Dr. Fredonia Highland  Encounter Date: 06/22/2016      PT End of Session - 06/22/16 0806    Visit Number 1   Number of Visits 24   Date for PT Re-Evaluation 09/14/16   PT Start Time 0802   PT Stop Time 0859   PT Time Calculation (min) 57 min   Activity Tolerance Patient tolerated treatment well      Past Medical History  Diagnosis Date  . Hyperlipidemia     takes Atorvastatin daily  . GERD (gastroesophageal reflux disease)     takes Omeprazole daily  . Depression     takes Zoloft daily  . Hypertension     takes Metoprolol,HCTZ,and Losartan daily.Takes Clonidine if needed  . Peripheral neuropathy (HCC)     takes Gabapentin daily  . Dysrhythmia     A Fib-takes Xarelto daily  . CHF (congestive heart failure) (HCC)     takes Lasix daily  . 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 COPD (chronic obstructive pulmonary disease) (HCC)     inhalers as needed  . Restless leg   . Back pain     age related  . Nocturia   . Enlarged prostate     benign  . Glaucoma     mild  . Stroke [facility]) 2012    denies residual on 05/05/2016  . Complication of anesthesia     bronchial spasms in ~ 2015; 2016; 2017  . Atrial fibrillation ([place])   . Pneumonia ?2015  . Type II diabetes mellitus ([place])     borderline but doesn't take any meds (05/05/2016)  . Arthritis     "hands primarily" (05/05/2016)    Past Surgical History  Procedure Laterality Date  . Cataract extraction w/ intraocular lens  implant, bilateral Bilateral   . Ulnar nerve transposition Right 1989  . Total knee arthroplasty Left 2008  . Partial knee arthroplasty Right 04/06/2016    Procedure: UNICOMPARTMENTAL RIGHT  KNEE;  Surgeon: Renette Butters, MD;  Location: [facility];  Service: Orthopedics;  Laterality: Right;  . I&d knee with poly exchange Right 05/04/2016    IRRIGATION AND DEBRIDEMENT Right KNEE WITH UNICOMPARTMENTAL POLY EXCHANGE  . Tonsillectomy and adenoidectomy    . Laparoscopic cholecystectomy  1995  . Joint replacement    . I&d knee with poly exchange Right 05/04/2016    Procedure: IRRIGATION AND DEBRIDEMENT Right KNEE WITH UNICOMPARTMENTAL POLY EXCHANGE;  Surgeon: Renette Butters, MD;  Location: [facility];  Service: Orthopedics;  Laterality: Right;    There were no vitals filed for this visit.      Subjective Assessment - 06/22/16 0807    Subjective Patient  underwent partial knee replacement 04/20/16 reports that he had deehiscence of wound and subsequent I & D 05/04/16 with revision of the knee replacement 05/15/16. Now limited to flexion at 110 degree.    Pertinent History knee replacement Lt TKA; partial knee replacement Rt knee 04/20/16; brain stem CVA 2012; gall bladder removed   How long can you sit comfortably? no limit   How long can you stand comfortably? 30 min    How long can you walk comfortably? 30 min    Diagnostic tests  xrays   Patient Stated Goals get stronger and improve balance    Currently in Pain? Yes   Pain Score 5    Pain Location Knee   Pain Orientation Right   Pain Descriptors / Indicators Pressure;Sharp   Pain Type Surgical pain   Pain Onset More than a month ago   Pain Frequency Intermittent   Aggravating Factors  bending; initial standing    Pain Relieving Factors gradual wt bearing whe stepping; tylenol             [facility] PT Assessment - 06/22/16 0001    Assessment   Medical Diagnosis Rt partial knee replacement    Referring Provider Dr. Fredonia Highland   Onset Date/Surgical Date 04/20/16   Hand Dominance Right   Next MD Visit 8/17   Prior Therapy yes - prior to dishiscence   Precautions   Precaution Comments noknee flexion greater than 110 deg     Restrictions   Weight Bearing Restrictions No   Balance Screen   Has the patient fallen in the past 6 months No   Has the patient had a decrease in activity level because of a fear of falling?  No   Is the patient reluctant to leave their home because of a fear of falling?  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    Leisure yard work; water exercise; active life style    Sensation   Additional Comments WFLs per pt report    Posture/Postural Control   Posture Comments head forward; shoulders rounded; flexed forward at hips    AROM   Overall AROM Comments mobility through hips limited by obesity    Right/Left Hip --  limited end ranges bilat    Right Knee Extension -4   Right Knee Flexion 107   Left Knee Extension 0   Left Knee Flexion 116   Strength   Right/Left Hip --  Rt hip 5/5; Lt 5/5 except ext 4+/5   Right/Left Knee --  5/5 biulat knee flex/ext   Flexibility   Hamstrings rt 75 deg; Lt 70 deg    Quadriceps prone knee flex Rt 100 deg Lt 90 deg    ITB tight Lt > Rt    Piriformis tight Lt > Rt    Ambulation/Gait   Gait Comments antalgic gait with limp Rt LE    Balance   Balance Assessed --  SLS - Lt 2 sec; Rt 1 sec                      [facility] Adult PT Treatment/Exercise - 06/22/16 0001    Knee/Hip Exercises: Stretches   Passive Hamstring Stretch 3 reps;30 seconds   Quad Stretch 3 reps;30 seconds   ITB Stretch 2 reps;30 seconds   Knee/Hip Exercises: Standing   Lateral Step Up 10 reps   Forward Step Up 10 reps   Functional Squat 20 reps   SLS 20-30 sec x 4 each LE   Other Standing Knee Exercises heel raises x 20    Vasopneumatic   Number Minutes Vasopneumatic  15 minutes   Vasopnuematic Location  Knee   Vasopneumatic Pressure Low   Vasopneumatic Temperature  3*  PT Education - 06/22/16 0850    Education provided Yes   Education Details HEP    Person(s) Educated Patient   Methods Explanation;Demonstration;Tactile cues;Verbal cues;Handout          PT Short Term Goals - 06/22/16 1254    PT SHORT TERM GOAL #1   Title Initiate HEP 07/06/16   Time 2   Period Weeks   Status Revised   PT SHORT TERM GOAL #2   Title Increase AROM Rt knee to 0 deg extension to 110 deg flexion 07/30/16   Time 6   Period Weeks   Status Revised   PT SHORT TERM GOAL #3   Title Patient to demonstrate diaphragmatic breathing techniques without difficulty 07/30/16   Time 6   Period Weeks   Status Revised   PT SHORT TERM GOAL #4   Title Improve gait pattern without assistive device 07/30/16   Time 6   Period Weeks   Status Revised           PT Long Term Goals - 06/22/16 1250    PT LONG TERM GOAL #1   Title Increase gait to 20-30 min with no pain and no limp 09/14/16   Time 12   Period Weeks   Status Revised   PT LONG TERM GOAL #2   Title 5/5 stength Rt LE 09/14/16   Time 12   Period Weeks   Status Revised   PT LONG TERM GOAL #3   Title Rt knee ROM 0 deg ext to 125-130 deg flexion 09/14/16   Time 12   Period Weeks   Status Revised   PT LONG TERM GOAL #4   Title Independent in HEP 09/14/16   Time 12   Period Weeks   Status Revised   PT LONG TERM GOAL #5   Title Improve FOTO to </= 36% limitation 09/14/16   Time 12   Period Weeks   Status Revised      G-code: Mobilty and Walking around  Current - CK Goal - CJ         Plan - 06/22/16 0850    Clinical Impression Statement Rush Landmark presents s/p Rt partial knee replacement with dihiscence followed by I & D and revision. Date of surgery 05/15/16. He has limited knee ROM; decreased LE strength; decreasd balance; abnormal gait pattern and decreased functional activity level/endurance.    Rehab Potential Good   PT Frequency 2x / week   PT Duration 12 weeks   PT  Treatment/Interventions Patient/family education;ADLs/Self Care Home Management;Cryotherapy;Electrical Stimulation;Iontophoresis 4mg /ml Dexamethasone;Moist Heat;Ultrasound;Manual techniques;Dry needling;Therapeutic activities;Therapeutic exercise   PT Next Visit Plan progress exercise with focus on strength; balance; gait. Patient wants to focus on breathing and gaining strength. WOrk to regain full extension - limit flexion to 110 deg per MD.    Consulted and Agree with Plan of Care Patient      Patient will benefit from skilled therapeutic intervention in order to improve the following deficits and impairments:  Improper body mechanics, Pain, Decreased range of motion, Decreased mobility, Decreased strength, Abnormal gait, Decreased activity tolerance  Visit Diagnosis: Right knee pain - Plan: PT plan of care cert/re-cert  Abnormality of gait - Plan: PT plan of care cert/re-cert  Other symptoms and signs involving the musculoskeletal system - Plan: PT plan of care cert/re-cert     Problem List Patient Active Problem List   Diagnosis Date Noted  . Obesity 06/01/2016  . Wound dehiscence 05/04/2016  . Knee osteoarthritis 04/06/2016  [place], MPH  06/22/2016, 12:58 PM  [facility] [place], [name], [address] Phone: [phone]   Fax:  [phone]  Name: TATEN JAFFEE MRN: [id number] Date of Birth: Feb 21, 1946

## 2016-06-22 NOTE — Patient Instructions (Signed)
HIP: Hamstrings - Supine   Place strap around foot. Raise leg up, keeping knee straight.  Bend opposite knee to protect back if indicated. Hold 30 seconds. 3 reps per set, 2-3 sets per day     Outer Hip Stretch: Reclined IT Band Stretch (Strap)   Strap around one foot, pull leg across body until you feel a pull or stretch, with shoulders on mat. Hold for 30 seconds. Repeat 3 times each leg. 2-3 times/day.  Piriformis Stretch   Lying on back, pull right knee toward opposite shoulder. Hold 30 seconds. Repeat 3 times. Do 2-3 sessions per day.   Quads / HF, Prone   Lie face down. Grasp one ankle with same-side hand. Use towel if needed to reach. Gently pull foot toward buttock.  Hold 30 seconds. Repeat 3 times per session. Do 2-3 sessions per day.    Functional Quadriceps: Chair Squat    Keeping feet flat on floor, shoulder width apart, squat as low as is comfortable. Use support as necessary. Repeat __10__ times per set. Do _1-3__ sets per session. Do _2_ sessions per day.    Heel Raise: Bilateral (Standing)    Rise on balls of feet. Repeat _10___ times per set. Do _1-3___ sets per session. Do _2___ sessions per day.    Balance: Unilateral    Attempt to balance on left leg, eyes open. Hold __20-30__ seconds. Repeat __5__ times per set. Do _1-3___ sets per session. Do __2__ sessions per day. Perform exercise with eyes closed.

## 2016-06-28 DIAGNOSIS — I272 Other secondary pulmonary hypertension: Secondary | ICD-10-CM | POA: Diagnosis not present

## 2016-06-28 DIAGNOSIS — I4891 Unspecified atrial fibrillation: Secondary | ICD-10-CM | POA: Diagnosis not present

## 2016-06-28 DIAGNOSIS — Z79899 Other long term (current) drug therapy: Secondary | ICD-10-CM | POA: Diagnosis not present

## 2016-06-29 ENCOUNTER — Encounter: Payer: Medicare Other | Admitting: Physical Therapy

## 2016-06-29 DIAGNOSIS — N451 Epididymitis: Secondary | ICD-10-CM | POA: Diagnosis not present

## 2016-06-29 DIAGNOSIS — R3911 Hesitancy of micturition: Secondary | ICD-10-CM | POA: Diagnosis not present

## 2016-06-29 DIAGNOSIS — R350 Frequency of micturition: Secondary | ICD-10-CM | POA: Diagnosis not present

## 2016-07-01 ENCOUNTER — Ambulatory Visit (INDEPENDENT_AMBULATORY_CARE_PROVIDER_SITE_OTHER): Payer: Medicare Other | Admitting: Physical Therapy

## 2016-07-01 DIAGNOSIS — M25561 Pain in right knee: Secondary | ICD-10-CM | POA: Diagnosis present

## 2016-07-01 DIAGNOSIS — R29898 Other symptoms and signs involving the musculoskeletal system: Secondary | ICD-10-CM | POA: Diagnosis not present

## 2016-07-01 DIAGNOSIS — R269 Unspecified abnormalities of gait and mobility: Secondary | ICD-10-CM | POA: Diagnosis not present

## 2016-07-01 NOTE — Therapy (Signed)
[place], [name], [address] Phone: [phone]   Fax:  [phone]  Physical Therapy Treatment  Patient Details  Name: Willie Pineda MRN: [id number] Date of Birth: [birth year] Referring Provider: Dr. Fredonia Highland  Encounter Date: 07/01/2016      PT End of Session - 07/01/16 0956    Visit Number 2   Number of Visits 24   Date for PT Re-Evaluation 09/14/16   PT Start Time 0932   PT Stop Time [id number]   PT Time Calculation (min) 57 min   Activity Tolerance Patient tolerated treatment well;No increased pain      Past Medical History:  Diagnosis Date  . Arthritis    "hands primarily" (05/05/2016)  . Atrial fibrillation ([place])   . Back pain    age related  . CHF (congestive heart failure) (HCC)    takes Lasix daily  . Complication of anesthesia    bronchial spasms in ~ 2015; 2016; 2017  . COPD (chronic obstructive pulmonary disease) (HCC)    inhalers as needed  . Depression    takes Zoloft daily  . Dysrhythmia    A Fib-takes Xarelto daily  . Enlarged prostate    benign  . GERD (gastroesophageal reflux disease)    takes Omeprazole daily  . Glaucoma    mild  . 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 Hyperlipidemia    takes Atorvastatin daily  . Hypertension    takes Metoprolol,HCTZ,and Losartan daily.Takes Clonidine if needed  . Nocturia   . Peripheral neuropathy (HCC)    takes Gabapentin daily  . Pneumonia ?2015  . Restless leg   . Stroke [facility]) 2012   denies residual on 05/05/2016  . Type II diabetes mellitus ([place])    borderline but doesn't take any meds (05/05/2016)    Past Surgical History:  Procedure Laterality Date  . CATARACT EXTRACTION W/ INTRAOCULAR LENS  IMPLANT, BILATERAL Bilateral   . I&D KNEE WITH POLY EXCHANGE Right 05/04/2016   IRRIGATION AND DEBRIDEMENT Right KNEE WITH UNICOMPARTMENTAL POLY EXCHANGE  . I&D KNEE WITH POLY EXCHANGE Right  05/04/2016   Procedure: IRRIGATION AND DEBRIDEMENT Right KNEE WITH UNICOMPARTMENTAL POLY EXCHANGE;  Surgeon: Renette Butters, MD;  Location: [facility];  Service: Orthopedics;  Laterality: Right;  . JOINT REPLACEMENT    . LAPAROSCOPIC CHOLECYSTECTOMY  1995  . PARTIAL KNEE ARTHROPLASTY Right 04/06/2016   Procedure: UNICOMPARTMENTAL RIGHT KNEE;  Surgeon: Renette Butters, MD;  Location: [facility];  Service: Orthopedics;  Laterality: Right;  . TONSILLECTOMY AND ADENOIDECTOMY    . TOTAL KNEE ARTHROPLASTY Left 2008  . ULNAR NERVE TRANSPOSITION Right 1989    There were no vitals filed for this visit.      Subjective Assessment - 07/01/16 1000    Subjective Pt reports no new changes since last visit.    Currently in Pain? Yes   Pain Score 2    Pain Location Knee   Pain Orientation Right   Pain Descriptors / Indicators Tightness;Aching   Aggravating Factors  first part of day, inactivity    Pain Relieving Factors activity, tylenol            [facility] PT Assessment - 07/01/16 0001      Assessment   Medical Diagnosis Rt partial knee replacement    Referring Provider Dr. Fredonia Highland   Onset Date/Surgical Date 04/20/16   Hand Dominance Right   Next  MD Visit 8/17     AROM   Right Knee Flexion 108          [facility] Adult PT Treatment/Exercise - 07/01/16 0001      Self-Care   Self-Care Other Self-Care Comments   Other Self-Care Comments  Pt educated on Borg scale and parameters for exercise.  Pt verbalized understanding .     Knee/Hip Exercises: Stretches   Passive Hamstring Stretch 3 reps;30 seconds  each leg   Gastroc Stretch Right;Left;2 reps;30 seconds     Knee/Hip Exercises: Aerobic   Stationary Bike L1: 5 min      Knee/Hip Exercises: Standing   Heel Raises Both;3 sets;5 reps;1 second  (5 each of toes straight, in, out)   Lateral Step Up Right;1 set;10 reps;Hand Hold: 1;Step Height: 8"  (slow eccentric control, VC for breath)   Forward Step Up Right;1 set;10 reps;Hand Hold: 1;Step  Height: 8"  (VC to breathe during step up)   Functional Squat 20 reps  (VC for breath out during exertion)   Other Standing Knee Exercises tandem stance 30 sec (each leg forward) x 2 reps each leg,  then weight shifts in tandem stance x 2 trials each leg forward; weight shifts side to side (5 sec hold each shift)     Modalities   Modalities Electrical Stimulation     Electrical Stimulation   Electrical Stimulation Location Rt knee    Electrical Stimulation Action ion repelling    Electrical Stimulation Parameters to tolerance    Electrical Stimulation Goals Edema;Pain     Vasopneumatic   Number Minutes Vasopneumatic  15 minutes   Vasopnuematic Location  Knee   Vasopneumatic Pressure Low   Vasopneumatic Temperature  3*                  PT Short Term Goals - 06/22/16 1254      PT SHORT TERM GOAL #1   Title Initiate HEP 07/06/16   Time 2   Period Weeks   Status Revised     PT SHORT TERM GOAL #2   Title Increase AROM Rt knee to 0 deg extension to 110 deg flexion 07/30/16   Time 6   Period Weeks   Status Revised     PT SHORT TERM GOAL #3   Title Patient to demonstrate diaphragmatic breathing techniques without difficulty 07/30/16   Time 6   Period Weeks   Status Revised     PT SHORT TERM GOAL #4   Title Improve gait pattern without assistive device 07/30/16   Time 6   Period Weeks   Status Revised           PT Long Term Goals - 06/22/16 1250      PT LONG TERM GOAL #1   Title Increase gait to 20-30 min with no pain and no limp 09/14/16   Time 12   Period Weeks   Status Revised     PT LONG TERM GOAL #2   Title 5/5 stength Rt LE 09/14/16   Time 12   Period Weeks   Status Revised     PT LONG TERM GOAL #3   Title Rt knee ROM 0 deg ext to 125-130 deg flexion 09/14/16   Time 12   Period Weeks   Status Revised     PT LONG TERM GOAL #4   Title Independent in HEP 09/14/16   Time 12   Period Weeks   Status Revised     PT LONG TERM  GOAL #5   Title  Improve FOTO to </= 36% limitation 09/14/16   Time 12   Period Weeks   Status Revised               Plan - 07/01/16 1346    Clinical Impression Statement Pt tolerated all exercises well, noting greater ease with breathing throughout exercise after given cues and no increase in pain.  Progressing towards goals.    Rehab Potential Good   PT Frequency 2x / week   PT Duration 12 weeks   PT Treatment/Interventions Patient/family education;ADLs/Self Care Home Management;Cryotherapy;Electrical Stimulation;Iontophoresis 4mg /ml Dexamethasone;Moist Heat;Ultrasound;Manual techniques;Dry needling;Therapeutic activities;Therapeutic exercise   PT Next Visit Plan progress exercise with focus on strength; balance; gait. Patient wants to focus on breathing and gaining strength. WOrk to regain full extension - limit flexion to 110 deg per MD.    Consulted and Agree with Plan of Care Patient      Patient will benefit from skilled therapeutic intervention in order to improve the following deficits and impairments:  Improper body mechanics, Pain, Decreased range of motion, Decreased mobility, Decreased strength, Abnormal gait, Decreased activity tolerance  Visit Diagnosis: Right knee pain  Abnormality of gait  Other symptoms and signs involving the musculoskeletal system     Problem List Patient Active Problem List   Diagnosis Date Noted  . Obesity 06/01/2016  . Wound dehiscence 05/04/2016  . Knee osteoarthritis 04/06/2016   Kerin Perna, PTA 07/01/16 1:48 PM  [facility] [place], [name], [address] Phone: [phone]   Fax:  [phone]  Name: Willie Pineda MRN: [id number] Date of Birth: 24-Sep-1946

## 2016-07-06 ENCOUNTER — Ambulatory Visit (INDEPENDENT_AMBULATORY_CARE_PROVIDER_SITE_OTHER): Payer: Medicare Other | Admitting: Physical Therapy

## 2016-07-06 DIAGNOSIS — R269 Unspecified abnormalities of gait and mobility: Secondary | ICD-10-CM

## 2016-07-06 DIAGNOSIS — M25561 Pain in right knee: Secondary | ICD-10-CM

## 2016-07-06 DIAGNOSIS — R29898 Other symptoms and signs involving the musculoskeletal system: Secondary | ICD-10-CM

## 2016-07-06 NOTE — Therapy (Signed)
[place], [name], [address] Phone: [phone]   Fax:  [phone]  Physical Therapy Treatment  Patient Details  Name: Willie Pineda MRN: [id number] Date of Birth: [birth year] Referring Provider: Dr. Fredonia Highland  Encounter Date: 07/06/2016      PT End of Session - 07/06/16 0847    Visit Number 3   Number of Visits 24   Date for PT Re-Evaluation 09/14/16   PT Start Time 0804   PT Stop Time 0901   PT Time Calculation (min) 57 min   Activity Tolerance Patient tolerated treatment well;No increased pain   Behavior During Therapy WFL for tasks assessed/performed      Past Medical History:  Diagnosis Date  . Arthritis    "hands primarily" (05/05/2016)  . Atrial fibrillation ([place])   . Back pain    age related  . CHF (congestive heart failure) (HCC)    takes Lasix daily  . Complication of anesthesia    bronchial spasms in ~ 2015; 2016; 2017  . COPD (chronic obstructive pulmonary disease) (HCC)    inhalers as needed  . Depression    takes Zoloft daily  . Dysrhythmia    A Fib-takes Xarelto daily  . Enlarged prostate    benign  . GERD (gastroesophageal reflux disease)    takes Omeprazole daily  . Glaucoma    mild  . 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 Hyperlipidemia    takes Atorvastatin daily  . Hypertension    takes Metoprolol,HCTZ,and Losartan daily.Takes Clonidine if needed  . Nocturia   . Peripheral neuropathy (HCC)    takes Gabapentin daily  . Pneumonia ?2015  . Restless leg   . Stroke [facility]) 2012   denies residual on 05/05/2016  . Type II diabetes mellitus ([place])    borderline but doesn't take any meds (05/05/2016)    Past Surgical History:  Procedure Laterality Date  . CATARACT EXTRACTION W/ INTRAOCULAR LENS  IMPLANT, BILATERAL Bilateral   . I&D KNEE WITH POLY EXCHANGE Right 05/04/2016   IRRIGATION AND DEBRIDEMENT Right KNEE WITH  UNICOMPARTMENTAL POLY EXCHANGE  . I&D KNEE WITH POLY EXCHANGE Right 05/04/2016   Procedure: IRRIGATION AND DEBRIDEMENT Right KNEE WITH UNICOMPARTMENTAL POLY EXCHANGE;  Surgeon: Renette Butters, MD;  Location: [place];  Service: Orthopedics;  Laterality: Right;  . JOINT REPLACEMENT    . LAPAROSCOPIC CHOLECYSTECTOMY  1995  . PARTIAL KNEE ARTHROPLASTY Right 04/06/2016   Procedure: UNICOMPARTMENTAL RIGHT KNEE;  Surgeon: Renette Butters, MD;  Location: [place];  Service: Orthopedics;  Laterality: Right;  . TONSILLECTOMY AND ADENOIDECTOMY    . TOTAL KNEE ARTHROPLASTY Left 2008  . ULNAR NERVE TRANSPOSITION Right 1989    There were no vitals filed for this visit.      Subjective Assessment - 07/06/16 0811    Subjective Pt reports the last session was "the best workout" he's had; felt great afterwards.  He complains of some fluid retention in abdomen.  He has been working out in water 45 min, 7 days a week.     Currently in Pain? Yes   Pain Score 2    Pain Location Knee   Pain Orientation Right   Pain Descriptors / Indicators Sore;Dull   Aggravating Factors  inactivity   Pain Relieving Factors activity, tylenol.             [facility] PT Assessment - 07/06/16 0001  Assessment   Medical Diagnosis Rt partial knee replacement    Referring Provider Dr. Fredonia Highland   Onset Date/Surgical Date 04/20/16   Hand Dominance Right   Next MD Visit 8/17                     [facility] Adult PT Treatment/Exercise - 07/06/16 0001      Self-Care   Self-Care Other Self-Care Comments   Other Self-Care Comments  Pt educated on diaphragmatic breathing and how to perform.  Pt verbalized understanding and returned demo without difficutly.       Knee/Hip Exercises: Stretches   Passive Hamstring Stretch Right;2 reps;30 seconds   Quad Stretch Right;3 reps;30 seconds  limited to 110 deg    Gastroc Stretch Right;3 reps;30 seconds   Soleus Stretch Right;2 reps;30 seconds     Knee/Hip Exercises:  Aerobic   Nustep L5: 5 min      Knee/Hip Exercises: Standing   Heel Raises Both;2 sets;10 reps  raise with 2 ft, lower with RLE.    Side Lunges Right;Left;2 sets;10 reps  VC for breath   Wall Squat 2 sets;10 reps  (2nd set with ball squeeze)   Other Standing Knee Exercises tandem stance 30 sec (each leg forward) x 2 reps each leg,  then weight shifts in tandem stance x 2 trials each leg forward, 2nd set with horiz head turns; weight shifts side to side (5 sec hold each shift)     Acupuncturist Stimulation Location Rt knee    Electrical Stimulation Action ion repelling    Electrical Stimulation Parameters to tolerance    Electrical Stimulation Goals Edema;Pain     Vasopneumatic   Number Minutes Vasopneumatic  15 minutes   Vasopnuematic Location  Knee   Vasopneumatic Pressure Low   Vasopneumatic Temperature  3*     Manual Therapy   Manual Therapy Taping   Manual therapy comments Rock tape applied to Rt knee incision to decrease adhesions in scar.                   PT Short Term Goals - 07/06/16 0900      PT SHORT TERM GOAL #1   Title Initiate HEP 07/06/16   Time 2   Period Weeks   Status Achieved     PT SHORT TERM GOAL #2   Title Increase AROM Rt knee to 0 deg extension to 110 deg flexion 07/30/16   Time 6   Period Weeks   Status Partially Met     PT SHORT TERM GOAL #3   Title Patient to demonstrate diaphragmatic breathing techniques without difficulty 07/30/16   Time 6   Period Weeks   Status Achieved     PT SHORT TERM GOAL #4   Title Improve gait pattern without assistive device 07/30/16   Time 6   Period Weeks   Status Achieved           PT Long Term Goals - 07/06/16 0830      PT LONG TERM GOAL #1   Title Increase gait to 20-30 min with no pain and no limp 09/14/16   Time 12   Period Weeks   Status Partially Met     PT LONG TERM GOAL #2   Title 5/5 stength Rt LE 09/14/16   Time 12   Period Weeks   Status On-going      PT LONG TERM GOAL #3   Title Rt knee ROM 0  deg ext to 125-130 deg flexion 09/14/16   Time 12   Period Weeks   Status On-going     PT LONG TERM GOAL #4   Title Independent in HEP 09/14/16   Time 12   Period Weeks   Status On-going     PT LONG TERM GOAL #5   Title Improve FOTO to </= 36% limitation 09/14/16   Time 12   Status On-going               Plan - 07/06/16 0829    Clinical Impression Statement Pt required less cues for breath during exercises this visit.  His balance is improving with tandem stance.  Tolerated all exercises without increase in pain.  Pt has met STG 1,3,and 4.     Rehab Potential Good   PT Frequency 2x / week   PT Duration 12 weeks   PT Treatment/Interventions Patient/family education;ADLs/Self Care Home Management;Cryotherapy;Electrical Stimulation;Iontophoresis 59m/ml Dexamethasone;Moist Heat;Ultrasound;Manual techniques;Dry needling;Therapeutic activities;Therapeutic exercise   PT Next Visit Plan progress exercise with focus on strength; balance; gait. Patient wants to focus on breathing and gaining strength. Work to regain full extension - limit flexion to 110 deg per MD.    Consulted and Agree with Plan of Care Patient      Patient will benefit from skilled therapeutic intervention in order to improve the following deficits and impairments:  Improper body mechanics, Pain, Decreased range of motion, Decreased mobility, Decreased strength, Abnormal gait, Decreased activity tolerance  Visit Diagnosis: Right knee pain  Abnormality of gait  Other symptoms and signs involving the musculoskeletal system     Problem List Patient Active Problem List   Diagnosis Date Noted  . Obesity 06/01/2016  . Wound dehiscence 05/04/2016  . Knee osteoarthritis 04/06/2016   JKerin Perna PTA 07/06/16 9:06 AM  [place] [address]Phone: [phone]   Fax:  [phone] Name: RBARY LIMBACHMRN: [id number] of Birth: 1Feb 03, 1947

## 2016-07-08 ENCOUNTER — Ambulatory Visit (INDEPENDENT_AMBULATORY_CARE_PROVIDER_SITE_OTHER): Payer: Medicare Other | Admitting: Physical Therapy

## 2016-07-08 DIAGNOSIS — R29898 Other symptoms and signs involving the musculoskeletal system: Secondary | ICD-10-CM | POA: Diagnosis not present

## 2016-07-08 DIAGNOSIS — M25561 Pain in right knee: Secondary | ICD-10-CM

## 2016-07-08 DIAGNOSIS — R269 Unspecified abnormalities of gait and mobility: Secondary | ICD-10-CM

## 2016-07-08 NOTE — Therapy (Signed)
[place], [name], [address] Phone: [phone]   Fax:  [phone]  Physical Therapy Treatment  Patient Details  Name: Willie Pineda MRN: [id number] Date of Birth: [birth year] Referring Provider: Dr. Fredonia Highland  Encounter Date: 07/08/2016      PT End of Session - 07/08/16 0807    Visit Number 4   Number of Visits 24   Date for PT Re-Evaluation 09/14/16   PT Start Time 0804   PT Stop Time 0902   PT Time Calculation (min) 58 min   Activity Tolerance Patient tolerated treatment well;No increased pain      Past Medical History:  Diagnosis Date  . Arthritis    "hands primarily" (05/05/2016)  . Atrial fibrillation ([place])   . Back pain    age related  . CHF (congestive heart failure) (HCC)    takes Lasix daily  . Complication of anesthesia    bronchial spasms in ~ 2015; 2016; 2017  . COPD (chronic obstructive pulmonary disease) (HCC)    inhalers as needed  . Depression    takes Zoloft daily  . Dysrhythmia    A Fib-takes Xarelto daily  . Enlarged prostate    benign  . GERD (gastroesophageal reflux disease)    takes Omeprazole daily  . Glaucoma    mild  . 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 Hyperlipidemia    takes Atorvastatin daily  . Hypertension    takes Metoprolol,HCTZ,and Losartan daily.Takes Clonidine if needed  . Nocturia   . Peripheral neuropathy (HCC)    takes Gabapentin daily  . Pneumonia ?2015  . Restless leg   . Stroke [facility]) 2012   denies residual on 05/05/2016  . Type II diabetes mellitus ([place])    borderline but doesn't take any meds (05/05/2016)    Past Surgical History:  Procedure Laterality Date  . CATARACT EXTRACTION W/ INTRAOCULAR LENS  IMPLANT, BILATERAL Bilateral   . I&D KNEE WITH POLY EXCHANGE Right 05/04/2016   IRRIGATION AND DEBRIDEMENT Right KNEE WITH UNICOMPARTMENTAL POLY EXCHANGE  . I&D KNEE WITH POLY EXCHANGE Right  05/04/2016   Procedure: IRRIGATION AND DEBRIDEMENT Right KNEE WITH UNICOMPARTMENTAL POLY EXCHANGE;  Surgeon: Renette Butters, MD;  Location: [place];  Service: Orthopedics;  Laterality: Right;  . JOINT REPLACEMENT    . LAPAROSCOPIC CHOLECYSTECTOMY  1995  . PARTIAL KNEE ARTHROPLASTY Right 04/06/2016   Procedure: UNICOMPARTMENTAL RIGHT KNEE;  Surgeon: Renette Butters, MD;  Location: [place];  Service: Orthopedics;  Laterality: Right;  . TONSILLECTOMY AND ADENOIDECTOMY    . TOTAL KNEE ARTHROPLASTY Left 2008  . ULNAR NERVE TRANSPOSITION Right 1989    There were no vitals filed for this visit.      Subjective Assessment - 07/08/16 0807    Subjective Pt reports the Rock tape stayed on through 2 days of swimming, took it off yesterday.  He reports his breathing and sleep has been better.  He notes in the last week he has lost 4 pounnds .    Currently in Pain? Yes   Pain Score 2    Pain Location Knee   Pain Orientation Right   Pain Descriptors / Indicators [facility] PT Assessment - 07/08/16 0001      Assessment   Medical Diagnosis Rt partial knee replacement    Referring Provider Dr. Fredonia Highland  Onset Date/Surgical Date 04/20/16   Hand Dominance Right   Next MD Visit 07/21/16          [facility] Adult PT Treatment/Exercise - 07/08/16 0001      Knee/Hip Exercises: Stretches   Gastroc Stretch Right;Left;3 reps;30 seconds   Soleus Stretch Right;2 reps;30 seconds     Knee/Hip Exercises: Aerobic   Nustep L5: 6 min      Knee/Hip Exercises: Machines for Strengthening   Cybex Knee Extension 2 plates with BLE, eccentric lower with RLE x 15 reps    Cybex Leg Press unable to get leg in machine.      Knee/Hip Exercises: Standing   Heel Raises Both;10 reps;1 set  raise with 2 ft, lower with RLE.    Side Lunges Right;Left;2 sets;10 reps   Functional Squat 20 reps  (VC for breath  out during exertion; tactile cues for weight shift to Rt )   Other Standing Knee Exercises tandem  stance 30 sec (each leg forward) x 2 sets each leg, 2nd set with horiz head turns. 1 rep with Rt foot forward with vertical head movements.      Public house manager Parameters to tolerance    Electrical Stimulation Goals Edema;Pain     Vasopneumatic   Number Minutes Vasopneumatic  15 minutes   Vasopnuematic Location  Knee   Vasopneumatic Pressure Low   Vasopneumatic Temperature  3*                  PT Short Term Goals - 07/06/16 0900      PT SHORT TERM GOAL #1   Title Initiate HEP 07/06/16   Time 2   Period Weeks   Status Achieved     PT SHORT TERM GOAL #2   Title Increase AROM Rt knee to 0 deg extension to 110 deg flexion 07/30/16   Time 6   Period Weeks   Status Partially Met     PT SHORT TERM GOAL #3   Title Patient to demonstrate diaphragmatic breathing techniques without difficulty 07/30/16   Time 6   Period Weeks   Status Achieved     PT SHORT TERM GOAL #4   Title Improve gait pattern without assistive device 07/30/16   Time 6   Period Weeks   Status Achieved           PT Long Term Goals - 07/06/16 0830      PT LONG TERM GOAL #1   Title Increase gait to 20-30 min with no pain and no limp 09/14/16   Time 12   Period Weeks   Status Partially Met     PT LONG TERM GOAL #2   Title 5/5 stength Rt LE 09/14/16   Time 12   Period Weeks   Status On-going     PT LONG TERM GOAL #3   Title Rt knee ROM 0 deg ext to 125-130 deg flexion 09/14/16   Time 12   Period Weeks   Status On-going     PT LONG TERM GOAL #4   Title Independent in HEP 09/14/16   Time 12   Period Weeks   Status On-going     PT LONG TERM GOAL #5   Title Improve FOTO to </= 36% limitation 09/14/16   Time 12   Status On-going  Plan - 07/08/16 0813    Clinical Impression Statement Pt reported elimination of pain with the 5 min of NuStep at  beginning of session. Breathing patterns with exercise have improved.  He required tactile cues to correct increased weight in LLE with squats. Progressing well towards unmet goals.     Rehab Potential Good   PT Frequency 2x / week   PT Duration 12 weeks   PT Treatment/Interventions Patient/family education;ADLs/Self Care Home Management;Cryotherapy;Electrical Stimulation;Iontophoresis '4mg'$ /ml Dexamethasone;Moist Heat;Ultrasound;Manual techniques;Dry needling;Therapeutic activities;Therapeutic exercise   PT Next Visit Plan progress exercise with focus on strength; balance; gait. Patient wants to focus on breathing and gaining strength. Work to regain full extension - limit flexion to 110 deg per MD.    Consulted and Agree with Plan of Care Patient      Patient will benefit from skilled therapeutic intervention in order to improve the following deficits and impairments:  Improper body mechanics, Pain, Decreased range of motion, Decreased mobility, Decreased strength, Abnormal gait, Decreased activity tolerance  Visit Diagnosis: Right knee pain  Abnormality of gait  Other symptoms and signs involving the musculoskeletal system     Problem List Patient Active Problem List   Diagnosis Date Noted  . Obesity 06/01/2016  . Wound dehiscence 05/04/2016  . Knee osteoarthritis 04/06/2016   Kerin Perna, PTA 07/08/16 9:04 AM   [place], [name], [address] Phone: [phone]   Fax:  [phone]  Name: Willie Pineda MRN: [id number] Date of Birth: [birth year]

## 2016-07-13 ENCOUNTER — Ambulatory Visit (INDEPENDENT_AMBULATORY_CARE_PROVIDER_SITE_OTHER): Payer: Medicare Other | Admitting: Physical Therapy

## 2016-07-13 DIAGNOSIS — M25561 Pain in right knee: Secondary | ICD-10-CM

## 2016-07-13 DIAGNOSIS — R29898 Other symptoms and signs involving the musculoskeletal system: Secondary | ICD-10-CM

## 2016-07-13 DIAGNOSIS — R269 Unspecified abnormalities of gait and mobility: Secondary | ICD-10-CM

## 2016-07-13 NOTE — Therapy (Signed)
[place], [name], [address] Phone: [phone]   Fax:  [phone]  Physical Therapy Treatment  Patient Details  Name: MALAKAI SCHOENHERR MRN: [id number] Date of Birth: [birth year] Referring Provider: Dr. Fredonia Highland  Encounter Date: 07/13/2016      PT End of Session - 07/13/16 0811    Visit Number 5   Number of Visits 24   Date for PT Re-Evaluation 09/14/16   PT Start Time 0804   PT Stop Time 0903   PT Time Calculation (min) 59 min   Activity Tolerance Patient tolerated treatment well;No increased pain      Past Medical History:  Diagnosis Date  . Arthritis    "hands primarily" (05/05/2016)  . Atrial fibrillation ([place])   . Back pain    age related  . CHF (congestive heart failure) (HCC)    takes Lasix daily  . Complication of anesthesia    bronchial spasms in ~ 2015; 2016; 2017  . COPD (chronic obstructive pulmonary disease) (HCC)    inhalers as needed  . Depression    takes Zoloft daily  . Dysrhythmia    A Fib-takes Xarelto daily  . Enlarged prostate    benign  . GERD (gastroesophageal reflux disease)    takes Omeprazole daily  . Glaucoma    mild  . 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 Hyperlipidemia    takes Atorvastatin daily  . Hypertension    takes Metoprolol,HCTZ,and Losartan daily.Takes Clonidine if needed  . Nocturia   . Peripheral neuropathy (HCC)    takes Gabapentin daily  . Pneumonia ?2015  . Restless leg   . Stroke [facility]) 2012   denies residual on 05/05/2016  . Type II diabetes mellitus ([place])    borderline but doesn't take any meds (05/05/2016)    Past Surgical History:  Procedure Laterality Date  . CATARACT EXTRACTION W/ INTRAOCULAR LENS  IMPLANT, BILATERAL Bilateral   . I&D KNEE WITH POLY EXCHANGE Right 05/04/2016   IRRIGATION AND DEBRIDEMENT Right KNEE WITH UNICOMPARTMENTAL POLY EXCHANGE  . I&D KNEE WITH POLY EXCHANGE Right  05/04/2016   Procedure: IRRIGATION AND DEBRIDEMENT Right KNEE WITH UNICOMPARTMENTAL POLY EXCHANGE;  Surgeon: Renette Butters, MD;  Location: [place];  Service: Orthopedics;  Laterality: Right;  . JOINT REPLACEMENT    . LAPAROSCOPIC CHOLECYSTECTOMY  1995  . PARTIAL KNEE ARTHROPLASTY Right 04/06/2016   Procedure: UNICOMPARTMENTAL RIGHT KNEE;  Surgeon: Renette Butters, MD;  Location: [place];  Service: Orthopedics;  Laterality: Right;  . TONSILLECTOMY AND ADENOIDECTOMY    . TOTAL KNEE ARTHROPLASTY Left 2008  . ULNAR NERVE TRANSPOSITION Right 1989    There were no vitals filed for this visit.      Subjective Assessment - 07/13/16 0811    Subjective Pt reports he has had more stiffness in Rt knee lately.  Unsure if it is weather related. He feels his breathing is getting so much better; energy level improved.     Currently in Pain? Yes   Pain Score 2    Pain Location Knee   Pain Orientation Right   Pain Descriptors / Indicators Dull   Pain Frequency Intermittent   Aggravating Factors  inactivity   Pain Relieving Factors activity, tylenol.             [facility] PT Assessment - 07/13/16 0001      Assessment   Medical Diagnosis Rt partial  knee replacement    Referring Provider Dr. Fredonia Highland   Onset Date/Surgical Date 04/20/16   Hand Dominance Right   Next MD Visit 07/21/16     AROM   Right Knee Extension -3   Right Knee Flexion 110  per MD instructions          [facility] Adult PT Treatment/Exercise - 07/13/16 0001      Knee/Hip Exercises: Stretches   Passive Hamstring Stretch Right;2 reps;30 seconds   Quad Stretch Right;3 reps;30 seconds  limited to 110 deg    Gastroc Stretch Right;Left;3 reps;30 seconds   Soleus Stretch Right;2 reps;30 seconds     Knee/Hip Exercises: Aerobic   Nustep L5: 5 min      Knee/Hip Exercises: Machines for Strengthening   Cybex Knee Extension RLE only 2 plates x 10 reps      Knee/Hip Exercises: Standing   Side Lunges Both;1 set;10 reps   Step Down  Left;1 set;15 reps;Hand Hold: 2;Step Height: 6"   Step Down Limitations (with backward step up, 2 hand hold, 6" step x 15 RLE)   Other Standing Knee Exercises tandem stance 15 sec (each leg forward) on 3" pad x 2 sets each leg, 2nd set with horiz head turns. 1 rep with Rt foot forward with vertical head movements.      Knee/Hip Exercises: Prone   Prone Knee Hang 2 minutes  1.5#     Electrical Stimulation   Electrical Stimulation Location Rt knee   Electrical Stimulation Action ion repelling    Electrical Stimulation Parameters to tolerance    Electrical Stimulation Goals Edema;Pain     Vasopneumatic   Number Minutes Vasopneumatic  15 minutes   Vasopnuematic Location  Knee   Vasopneumatic Pressure Low   Vasopneumatic Temperature  3*      Manual Therapy   Manual Therapy Taping;Passive ROM;Soft tissue mobilization   Manual therapy comments Rock tape applied to Rt knee incision to decrease adhesions in scar.    Soft tissue mobilization to Rt hamstring.    Passive ROM Rt knee into extension x 30 sec x 5 reps                  PT Short Term Goals - 07/06/16 0900      PT SHORT TERM GOAL #1   Title Initiate HEP 07/06/16   Time 2   Period Weeks   Status Achieved     PT SHORT TERM GOAL #2   Title Increase AROM Rt knee to 0 deg extension to 110 deg flexion 07/30/16   Time 6   Period Weeks   Status Partially Met     PT SHORT TERM GOAL #3   Title Patient to demonstrate diaphragmatic breathing techniques without difficulty 07/30/16   Time 6   Period Weeks   Status Achieved     PT SHORT TERM GOAL #4   Title Improve gait pattern without assistive device 07/30/16   Time 6   Period Weeks   Status Achieved           PT Long Term Goals - 07/06/16 0830      PT LONG TERM GOAL #1   Title Increase gait to 20-30 min with no pain and no limp 09/14/16   Time 12   Period Weeks   Status Partially Met     PT LONG TERM GOAL #2   Title 5/5 stength Rt LE 09/14/16   Time 12    Period Weeks   Status On-going  PT LONG TERM GOAL #3   Title Rt knee ROM 0 deg ext to 125-130 deg flexion 09/14/16   Time 12   Period Weeks   Status On-going     PT LONG TERM GOAL #4   Title Independent in HEP 09/14/16   Time 12   Period Weeks   Status On-going     PT LONG TERM GOAL #5   Title Improve FOTO to </= 36% limitation 09/14/16   Time 12   Status On-going               Plan - 07/13/16 5488    Clinical Impression Statement Pt demonstrated mproved Rt knee ROM, strength and balance: able to tolerate increased resistance.  Progressing well towards unmet goals.    Rehab Potential Good   PT Frequency 2x / week   PT Duration 12 weeks   PT Treatment/Interventions Patient/family education;ADLs/Self Care Home Management;Cryotherapy;Electrical Stimulation;Iontophoresis 66m/ml Dexamethasone;Moist Heat;Ultrasound;Manual techniques;Dry needling;Therapeutic activities;Therapeutic exercise   PT Next Visit Plan progress exercise with focus on strength; balance; gait. Patient wants to focus on breathing and gaining strength. Work to regain full extension - limit flexion to 110 deg per MD.    Consulted and Agree with Plan of Care Patient      Patient will benefit from skilled therapeutic intervention in order to improve the following deficits and impairments:  Improper body mechanics, Pain, Decreased range of motion, Decreased mobility, Decreased strength, Abnormal gait, Decreased activity tolerance  Visit Diagnosis: Right knee pain  Abnormality of gait  Other symptoms and signs involving the musculoskeletal system     Problem List Patient Active Problem List   Diagnosis Date Noted  . Obesity 06/01/2016  . Wound dehiscence 05/04/2016  . Knee osteoarthritis 04/06/2016   JKerin Perna PTA 07/13/16 8:53 AM  [facility] [place] [address]Phone: [phone]  Fax:   [phone] Name: RHAYGEN ZEBROWSKIMRN: [id number] of Birth: [birth year]

## 2016-07-15 ENCOUNTER — Ambulatory Visit (INDEPENDENT_AMBULATORY_CARE_PROVIDER_SITE_OTHER): Payer: Medicare Other | Admitting: Physical Therapy

## 2016-07-15 DIAGNOSIS — M25561 Pain in right knee: Secondary | ICD-10-CM

## 2016-07-15 DIAGNOSIS — R269 Unspecified abnormalities of gait and mobility: Secondary | ICD-10-CM | POA: Diagnosis not present

## 2016-07-15 DIAGNOSIS — R29898 Other symptoms and signs involving the musculoskeletal system: Secondary | ICD-10-CM | POA: Diagnosis not present

## 2016-07-15 NOTE — Therapy (Signed)
[place], [name], [address] Phone: [phone]   Fax:  [phone]  Physical Therapy Treatment  Patient Details  Name: Willie Pineda MRN: [id number] Date of Birth: 04-07-1946 Referring Provider: Dr. Fredonia Highland   Encounter Date: 07/15/2016      PT End of Session - 07/15/16 0814    Visit Number 6   Number of Visits 24   Date for PT Re-Evaluation 09/14/16   PT Start Time 0807  pt arrived late   PT Stop Time 0858   PT Time Calculation (min) 51 min   Activity Tolerance Patient tolerated treatment well;No increased pain      Past Medical History:  Diagnosis Date  . Arthritis    "hands primarily" (05/05/2016)  . Atrial fibrillation ([place])   . Back pain    age related  . CHF (congestive heart failure) (HCC)    takes Lasix daily  . Complication of anesthesia    bronchial spasms in ~ 2015; 2016; 2017  . COPD (chronic obstructive pulmonary disease) (HCC)    inhalers as needed  . Depression    takes Zoloft daily  . Dysrhythmia    A Fib-takes Xarelto daily  . Enlarged prostate    benign  . GERD (gastroesophageal reflux disease)    takes Omeprazole daily  . Glaucoma    mild  . 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 Hyperlipidemia    takes Atorvastatin daily  . Hypertension    takes Metoprolol,HCTZ,and Losartan daily.Takes Clonidine if needed  . Nocturia   . Peripheral neuropathy (HCC)    takes Gabapentin daily  . Pneumonia ?2015  . Restless leg   . Stroke [facility]) 2012   denies residual on 05/05/2016  . Type II diabetes mellitus ([place])    borderline but doesn't take any meds (05/05/2016)    Past Surgical History:  Procedure Laterality Date  . CATARACT EXTRACTION W/ INTRAOCULAR LENS  IMPLANT, BILATERAL Bilateral   . I&D KNEE WITH POLY EXCHANGE Right 05/04/2016   IRRIGATION AND DEBRIDEMENT Right KNEE WITH UNICOMPARTMENTAL POLY EXCHANGE  . I&D KNEE WITH  POLY EXCHANGE Right 05/04/2016   Procedure: IRRIGATION AND DEBRIDEMENT Right KNEE WITH UNICOMPARTMENTAL POLY EXCHANGE;  Surgeon: Renette Butters, MD;  Location: [place];  Service: Orthopedics;  Laterality: Right;  . JOINT REPLACEMENT    . LAPAROSCOPIC CHOLECYSTECTOMY  1995  . PARTIAL KNEE ARTHROPLASTY Right 04/06/2016   Procedure: UNICOMPARTMENTAL RIGHT KNEE;  Surgeon: Renette Butters, MD;  Location: [place];  Service: Orthopedics;  Laterality: Right;  . TONSILLECTOMY AND ADENOIDECTOMY    . TOTAL KNEE ARTHROPLASTY Left 2008  . ULNAR NERVE TRANSPOSITION Right 1989    There were no vitals filed for this visit.      Subjective Assessment - 07/15/16 0814    Subjective Pt reports he continues to have pain in Rt knee with first few steps after sitting still.  Breathing during exercise is becoming more automatic.     Pertinent History knee replacement Lt TKA; partial knee replacement Rt knee 04/20/16; brain stem CVA 2012; gall bladder removed   Currently in Pain? Yes   Pain Score 2    Pain Location Knee   Pain Orientation Right   Pain Descriptors / Indicators Simonne Martinet PT Assessment - 07/15/16 0001      Assessment   Medical Diagnosis  Rt partial knee replacement    Referring Provider Dr. Fredonia Highland    Onset Date/Surgical Date 04/20/16   Hand Dominance Right   Next MD Visit 07/21/16          [facility] Adult PT Treatment/Exercise - 07/15/16 0001      Knee/Hip Exercises: Stretches   Passive Hamstring Stretch Right;2 reps;30 seconds   Quad Stretch Right;2 reps;30 seconds   Gastroc Stretch Right;Left;3 reps;30 seconds   Soleus Stretch Right;2 reps;30 seconds     Knee/Hip Exercises: Aerobic   Nustep L5: 5 min      Knee/Hip Exercises: Standing   Lateral Step Up Limitations Lateral Lt heel taps off of 3" step x 15 reps (some mild cramping in Rt hip at end of reps, resolved with rest)   Step Down Left;1 set;15 reps;Hand Hold: 2;Step Height: 4"   Step Down Limitations (with  backward step up, 2 hand hold, 3" step x 15 RLE)   Wall Squat 5 seconds;2 sets;5 reps  with ball squeeze    SLS 20-30 sec x 4 each LE  blue pad too difficult   Other Standing Knee Exercises backwards walking on treadmill @ -0.7 x 1 min x 3 reps (challenging)      Electrical Stimulation   Electrical Stimulation Location Rt knee   Electrical Stimulation Action ion repelling    Electrical Stimulation Parameters to tolerance    Electrical Stimulation Goals Edema;Pain     Vasopneumatic   Number Minutes Vasopneumatic  15 minutes   Vasopnuematic Location  Knee   Vasopneumatic Temperature  3*                  PT Short Term Goals - 07/06/16 0900      PT SHORT TERM GOAL #1   Title Initiate HEP 07/06/16   Time 2   Period Weeks   Status Achieved     PT SHORT TERM GOAL #2   Title Increase AROM Rt knee to 0 deg extension to 110 deg flexion 07/30/16   Time 6   Period Weeks   Status Partially Met     PT SHORT TERM GOAL #3   Title Patient to demonstrate diaphragmatic breathing techniques without difficulty 07/30/16   Time 6   Period Weeks   Status Achieved     PT SHORT TERM GOAL #4   Title Improve gait pattern without assistive device 07/30/16   Time 6   Period Weeks   Status Achieved           PT Long Term Goals - 07/06/16 0830      PT LONG TERM GOAL #1   Title Increase gait to 20-30 min with no pain and no limp 09/14/16   Time 12   Period Weeks   Status Partially Met     PT LONG TERM GOAL #2   Title 5/5 stength Rt LE 09/14/16   Time 12   Period Weeks   Status On-going     PT LONG TERM GOAL #3   Title Rt knee ROM 0 deg ext to 125-130 deg flexion 09/14/16   Time 12   Period Weeks   Status On-going     PT LONG TERM GOAL #4   Title Independent in HEP 09/14/16   Time 12   Period Weeks   Status On-going     PT LONG TERM GOAL #5   Title Improve FOTO to </= 36% limitation 09/14/16   Time 12   Status On-going  Plan - 07/15/16 0844     Clinical Impression Statement Pt tolerated 3" step up backwards much better than 6" step; improved control in motion.  Retro gait on treadmill challenging and pt fatigued quickly.  Tolerated all exercises well without pain.  Pt continues to making gains towards unmet goals.     PT Frequency 2x / week   PT Duration 12 weeks   PT Treatment/Interventions Patient/family education;ADLs/Self Care Home Management;Cryotherapy;Electrical Stimulation;Iontophoresis 60m/ml Dexamethasone;Moist Heat;Ultrasound;Manual techniques;Dry needling;Therapeutic activities;Therapeutic exercise   PT Next Visit Plan Continue progressive strengthening of RLE, balance, gait, as well as breathing exercises.  Limit Rt knee flexion to 110 deg per MD.    Consulted and Agree with Plan of Care Patient      Patient will benefit from skilled therapeutic intervention in order to improve the following deficits and impairments:  Improper body mechanics, Pain, Decreased range of motion, Decreased mobility, Decreased strength, Abnormal gait, Decreased activity tolerance  Visit Diagnosis: Right knee pain  Abnormality of gait  Other symptoms and signs involving the musculoskeletal system     Problem List Patient Active Problem List   Diagnosis Date Noted  . Obesity 06/01/2016  . Wound dehiscence 05/04/2016  . Knee osteoarthritis 04/06/2016   JKerin Perna PTA 07/15/16 8:55 AM  [facility] [address]Phone: [phone]  Fax:  [phone] Name: Willie PARFAITMRN: [id number] of Birth: [birth year]

## 2016-07-20 ENCOUNTER — Ambulatory Visit (INDEPENDENT_AMBULATORY_CARE_PROVIDER_SITE_OTHER): Payer: Medicare Other | Admitting: Physical Therapy

## 2016-07-20 DIAGNOSIS — R29898 Other symptoms and signs involving the musculoskeletal system: Secondary | ICD-10-CM | POA: Diagnosis not present

## 2016-07-20 DIAGNOSIS — R269 Unspecified abnormalities of gait and mobility: Secondary | ICD-10-CM | POA: Diagnosis not present

## 2016-07-20 DIAGNOSIS — M25561 Pain in right knee: Secondary | ICD-10-CM | POA: Diagnosis present

## 2016-07-20 NOTE — Therapy (Signed)
[place], [name], [address] Phone: [phone]   Fax:  [phone]  Physical Therapy Treatment  Patient Details  Name: Willie Pineda MRN: [id number] Date of Birth: 1946/04/13 Referring Provider: Dr. Fredonia Highland  Encounter Date: 07/20/2016      PT End of Session - 07/20/16 0815    Visit Number 7   Number of Visits 24   Date for PT Re-Evaluation 09/14/16   PT Start Time 0810  pt arrived late   PT Stop Time 0900   PT Time Calculation (min) 50 min   Activity Tolerance Patient tolerated treatment well;No increased pain      Past Medical History:  Diagnosis Date  . Arthritis    "hands primarily" (05/05/2016)  . Atrial fibrillation ([place])   . Back pain    age related  . CHF (congestive heart failure) (HCC)    takes Lasix daily  . Complication of anesthesia    bronchial spasms in ~ 2015; 2016; 2017  . COPD (chronic obstructive pulmonary disease) (HCC)    inhalers as needed  . Depression    takes Zoloft daily  . Dysrhythmia    A Fib-takes Xarelto daily  . Enlarged prostate    benign  . GERD (gastroesophageal reflux disease)    takes Omeprazole daily  . Glaucoma    mild  . 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 Hyperlipidemia    takes Atorvastatin daily  . Hypertension    takes Metoprolol,HCTZ,and Losartan daily.Takes Clonidine if needed  . Nocturia   . Peripheral neuropathy (HCC)    takes Gabapentin daily  . Pneumonia ?2015  . Restless leg   . Stroke [facility]) 2012   denies residual on 05/05/2016  . Type II diabetes mellitus ([place])    borderline but doesn't take any meds (05/05/2016)    Past Surgical History:  Procedure Laterality Date  . CATARACT EXTRACTION W/ INTRAOCULAR LENS  IMPLANT, BILATERAL Bilateral   . I&D KNEE WITH POLY EXCHANGE Right 05/04/2016   IRRIGATION AND DEBRIDEMENT Right KNEE WITH UNICOMPARTMENTAL POLY EXCHANGE  . I&D KNEE WITH  POLY EXCHANGE Right 05/04/2016   Procedure: IRRIGATION AND DEBRIDEMENT Right KNEE WITH UNICOMPARTMENTAL POLY EXCHANGE;  Surgeon: Renette Butters, MD;  Location: [place];  Service: Orthopedics;  Laterality: Right;  . JOINT REPLACEMENT    . LAPAROSCOPIC CHOLECYSTECTOMY  1995  . PARTIAL KNEE ARTHROPLASTY Right 04/06/2016   Procedure: UNICOMPARTMENTAL RIGHT KNEE;  Surgeon: Renette Butters, MD;  Location: [place];  Service: Orthopedics;  Laterality: Right;  . TONSILLECTOMY AND ADENOIDECTOMY    . TOTAL KNEE ARTHROPLASTY Left 2008  . ULNAR NERVE TRANSPOSITION Right 1989    There were no vitals filed for this visit.      Subjective Assessment - 07/20/16 0816    Subjective "I haven't done much for the last two days". Pt states he has been dealing with gouty pain for the last day.   Pt reported he has the same old, nagging pain in the morning.     Currently in Pain? Yes   Pain Score 2    Pain Location Knee   Pain Orientation Right   Pain Descriptors / Indicators Aching   Aggravating Factors  inactivity   Pain Relieving Factors activity, tylenol.              [facility] PT Assessment - 07/20/16 0001      Assessment  Medical Diagnosis Rt partial knee replacement    Referring Provider Dr. Fredonia Highland   Onset Date/Surgical Date 04/20/16   Hand Dominance Right   Next MD Visit 07/21/16     ROM / Strength   AROM / PROM / Strength AROM;Strength     AROM   Right Knee Extension -2   Right Knee Flexion 110  limited per MD order.      Strength   Strength Assessment Site Knee   Right/Left Hip Right;Left   Right Hip Flexion 5/5   Right Hip Extension 5/5   Right Hip ABduction 5/5   Right/Left Knee Right   Right Knee Flexion 5/5                     [facility] Adult PT Treatment/Exercise - 07/20/16 0001      Knee/Hip Exercises: Stretches   Passive Hamstring Stretch Right;2 reps;30 seconds   Gastroc Stretch Right;Left;3 reps;30 seconds   Soleus Stretch Right;2 reps;30 seconds      Knee/Hip Exercises: Aerobic   Tread Mill @ 2.5 x 2 min - pt stopped due to being out of breath.      Knee/Hip Exercises: Standing   Wall Squat 2 sets;10 reps;5 seconds  with ball squeeze    Other Standing Knee Exercises backwards walking on treadmill @ -0.7 x 2 min. VC for step length.     Other Standing Knee Exercises Alternating toe taps to cup on 3" step with VC for control.   2 sets of 10.  Tandem stance x 30 sec x 2 sets     Knee/Hip Exercises: Prone   Prone Knee Hang 2 minutes     Electrical Stimulation   Electrical Stimulation Location Rt knee   Electrical Stimulation Action ion repelling    Electrical Stimulation Parameters to tolerance    Electrical Stimulation Goals Edema;Pain     Vasopneumatic   Number Minutes Vasopneumatic  15 minutes   Vasopnuematic Location  Knee   Vasopneumatic Pressure Medium   Vasopneumatic Temperature  3*                  PT Short Term Goals - 07/06/16 0900      PT SHORT TERM GOAL #1   Title Initiate HEP 07/06/16   Time 2   Period Weeks   Status Achieved     PT SHORT TERM GOAL #2   Title Increase AROM Rt knee to 0 deg extension to 110 deg flexion 07/30/16   Time 6   Period Weeks   Status Partially Met     PT SHORT TERM GOAL #3   Title Patient to demonstrate diaphragmatic breathing techniques without difficulty 07/30/16   Time 6   Period Weeks   Status Achieved     PT SHORT TERM GOAL #4   Title Improve gait pattern without assistive device 07/30/16   Time 6   Period Weeks   Status Achieved           PT Long Term Goals - 07/20/16 1320      PT LONG TERM GOAL #1   Title Increase gait to 20-30 min with no pain and no limp 09/14/16   Time 12   Period Weeks   Status Partially Met  has limp when initially beginning gait     PT LONG TERM GOAL #2   Title 5/5 stength Rt LE 09/14/16   Time 12   Period Weeks   Status Achieved  PT LONG TERM GOAL #3   Title Rt knee ROM 0 deg ext to 125-130 deg flexion 09/14/16    Time 12   Period Weeks   Status On-going     PT LONG TERM GOAL #4   Title Independent in HEP 09/14/16   Time 12   Period Weeks   Status On-going     PT LONG TERM GOAL #5   Title Improve FOTO to </= 36% limitation 09/14/16   Time 12   Period Weeks   Status On-going               Plan - 07/20/16 1321    Clinical Impression Statement Pt demonstrated slight improvement in Rt knee extension ROM, Rt knee flexion at 110 deg per MD.  Pt demonstrated improved Rt knee/hip strength; has met LTG # 2.  Proprioception activities continue to be challenging for him.  He is making great gains towards remaining goals.    Rehab Potential Good   PT Frequency 2x / week   PT Duration 12 weeks   PT Treatment/Interventions Patient/family education;ADLs/Self Care Home Management;Cryotherapy;Electrical Stimulation;Iontophoresis 70m/ml Dexamethasone;Moist Heat;Ultrasound;Manual techniques;Dry needling;Therapeutic activities;Therapeutic exercise   PT Next Visit Plan Continue progressive strengthening of RLE, balance, gait, as well as breathing exercises.  Await further instruction from MD.    Consulted and Agree with Plan of Care Patient      Patient will benefit from skilled therapeutic intervention in order to improve the following deficits and impairments:  Improper body mechanics, Pain, Decreased range of motion, Decreased mobility, Decreased strength, Abnormal gait, Decreased activity tolerance  Visit Diagnosis: Right knee pain  Abnormality of gait  Other symptoms and signs involving the musculoskeletal system     Problem List Patient Active Problem List   Diagnosis Date Noted  . Obesity 06/01/2016  . Wound dehiscence 05/04/2016  . Knee osteoarthritis 04/06/2016   JKerin Perna PTA 07/20/16 1:27 PM  [place] [address]Phone: [phone]  Fax:  [phone] Name: Willie POOLEYMRN: [id number] of Birth: [birth year]

## 2016-07-21 DIAGNOSIS — T814XXD Infection following a procedure, subsequent encounter: Secondary | ICD-10-CM | POA: Diagnosis not present

## 2016-07-22 ENCOUNTER — Ambulatory Visit (INDEPENDENT_AMBULATORY_CARE_PROVIDER_SITE_OTHER): Payer: Medicare Other | Admitting: Physical Therapy

## 2016-07-22 DIAGNOSIS — R269 Unspecified abnormalities of gait and mobility: Secondary | ICD-10-CM | POA: Diagnosis not present

## 2016-07-22 DIAGNOSIS — M25561 Pain in right knee: Secondary | ICD-10-CM | POA: Diagnosis present

## 2016-07-22 DIAGNOSIS — R29898 Other symptoms and signs involving the musculoskeletal system: Secondary | ICD-10-CM

## 2016-07-22 NOTE — Therapy (Signed)
[place], [name], [address] Phone: [phone]   Fax:  [phone]  Physical Therapy Treatment  Patient Details  Name: Willie Pineda MRN: [id number] Date of Birth: 08/24/1946 Referring Provider: Dr. Fredonia Highland  Encounter Date: 07/22/2016      PT End of Session - 07/22/16 0812    Visit Number 8   Number of Visits 24   Date for PT Re-Evaluation 09/14/16   PT Start Time 0805   PT Stop Time 0905   PT Time Calculation (min) 60 min   Activity Tolerance Patient tolerated treatment well;No increased pain      Past Medical History:  Diagnosis Date  . Arthritis    "hands primarily" (05/05/2016)  . Atrial fibrillation ([place])   . Back pain    age related  . CHF (congestive heart failure) (HCC)    takes Lasix daily  . Complication of anesthesia    bronchial spasms in ~ 2015; 2016; 2017  . COPD (chronic obstructive pulmonary disease) (HCC)    inhalers as needed  . Depression    takes Zoloft daily  . Dysrhythmia    A Fib-takes Xarelto daily  . Enlarged prostate    benign  . GERD (gastroesophageal reflux disease)    takes Omeprazole daily  . Glaucoma    mild  . 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 Hyperlipidemia    takes Atorvastatin daily  . Hypertension    takes Metoprolol,HCTZ,and Losartan daily.Takes Clonidine if needed  . Nocturia   . Peripheral neuropathy (HCC)    takes Gabapentin daily  . Pneumonia ?2015  . Restless leg   . Stroke [facility]) 2012   denies residual on 05/05/2016  . Type II diabetes mellitus ([place])    borderline but doesn't take any meds (05/05/2016)    Past Surgical History:  Procedure Laterality Date  . CATARACT EXTRACTION W/ INTRAOCULAR LENS  IMPLANT, BILATERAL Bilateral   . I&D KNEE WITH POLY EXCHANGE Right 05/04/2016   IRRIGATION AND DEBRIDEMENT Right KNEE WITH UNICOMPARTMENTAL POLY EXCHANGE  . I&D KNEE WITH POLY EXCHANGE Right  05/04/2016   Procedure: IRRIGATION AND DEBRIDEMENT Right KNEE WITH UNICOMPARTMENTAL POLY EXCHANGE;  Surgeon: Renette Butters, MD;  Location: [place];  Service: Orthopedics;  Laterality: Right;  . JOINT REPLACEMENT    . LAPAROSCOPIC CHOLECYSTECTOMY  1995  . PARTIAL KNEE ARTHROPLASTY Right 04/06/2016   Procedure: UNICOMPARTMENTAL RIGHT KNEE;  Surgeon: Renette Butters, MD;  Location: [place];  Service: Orthopedics;  Laterality: Right;  . TONSILLECTOMY AND ADENOIDECTOMY    . TOTAL KNEE ARTHROPLASTY Left 2008  . ULNAR NERVE TRANSPOSITION Right 1989    There were no vitals filed for this visit.      Subjective Assessment - 07/22/16 0810    Subjective Pt had follow up with MD.  Pt states MD is pleased with his progress and he is to "go slowly and increase ROM as comfortable".  He is to return to MD in 1 yr.     Pertinent History knee replacement Lt TKA; partial knee replacement Rt knee 04/20/16; brain stem CVA 2012; gall bladder removed   Currently in Pain? Yes   Pain Score 2    Pain Location Knee   Pain Orientation Right   Pain Descriptors / Indicators Aching            [facility] PT Assessment - 07/22/16 0001  Assessment   Medical Diagnosis Rt partial knee replacement    Referring Provider Dr. Renaye Rakers   Onset Date/Surgical Date 04/20/16   Hand Dominance Right   Next MD Visit 07/2017     AROM   Right/Left Knee Left;Right   Right Knee Flexion 120   Left Knee Flexion 121     Flexibility   Quadriceps prone, Rt knee flexion 115 deg           [facility] Adult PT Treatment/Exercise - 07/22/16 0001      Knee/Hip Exercises: Stretches   Passive Hamstring Stretch Right;2 reps;30 seconds   Quad Stretch Right;3 reps;20 seconds   Gastroc Stretch Right;Left;3 reps;30 seconds   Other Knee/Hip Stretches Standing adductor stretch - side lunge x 20 sec x 3 reps each direction.      Knee/Hip Exercises: Aerobic   Nustep L5: 5 min      Knee/Hip Exercises: Standing   Lateral Step Up  Limitations Rt/ Lt onto Bosu with light unilateral UE support x 12 reps each leg.    Forward Step Up 1 set;5 reps;Left;Hand Hold: 2  10" step, then lifting Rt knee to simulate mounting horse   Standing Knee exercise Tandem walk 12 ft x 4 reps (very challenging, no UE support) ; backwards tandem gait with unillateral support x 15 x 5 reps.    Other Standing Knee Exercises Lunge to High kneeling, Lt/Rt on 3" pad x 1 rep each side with heavy UE support.      Academic librarian Parameters to tolerance    Electrical Stimulation Goals Edema;Pain     Vasopneumatic   Number Minutes Vasopneumatic  15 minutes   Vasopnuematic Location  Knee   Vasopneumatic Pressure Low   Vasopneumatic Temperature  3*                  PT Short Term Goals - 07/06/16 0900      PT SHORT TERM GOAL #1   Title Initiate HEP 07/06/16   Time 2   Period Weeks   Status Achieved     PT SHORT TERM GOAL #2   Title Increase AROM Rt knee to 0 deg extension to 110 deg flexion 07/30/16   Time 6   Period Weeks   Status Partially Met     PT SHORT TERM GOAL #3   Title Patient to demonstrate diaphragmatic breathing techniques without difficulty 07/30/16   Time 6   Period Weeks   Status Achieved     PT SHORT TERM GOAL #4   Title Improve gait pattern without assistive device 07/30/16   Time 6   Period Weeks   Status Achieved           PT Long Term Goals - 07/20/16 1320      PT LONG TERM GOAL #1   Title Increase gait to 20-30 min with no pain and no limp 09/14/16   Time 12   Period Weeks   Status Partially Met  has limp when initially beginning gait     PT LONG TERM GOAL #2   Title 5/5 stength Rt LE 09/14/16   Time 12   Period Weeks   Status Achieved     PT LONG TERM GOAL #3   Title Rt knee ROM 0 deg ext to 125-130 deg flexion 09/14/16   Time 12   Period Weeks   Status  On-going      PT LONG TERM GOAL #4   Title Independent in HEP 09/14/16   Time 12   Period Weeks   Status On-going     PT LONG TERM GOAL #5   Title Improve FOTO to </= 36% limitation 09/14/16   Time 12   Period Weeks   Status On-going               Plan - 07/22/16 0911    Clinical Impression Statement Pt no longer limited by ROM restriction.  [name] demonstrated improved knee flexion ROM on both knees.  He tolerated all exercises well, with minimal increase in pain. He would like to include exercises that will assist in his safe return to horse riding.     Rehab Potential Good   PT Frequency 2x / week   PT Duration 12 weeks   PT Treatment/Interventions Patient/family education;ADLs/Self Care Home Management;Cryotherapy;Electrical Stimulation;Iontophoresis '4mg'$ /ml Dexamethasone;Moist Heat;Ultrasound;Manual techniques;Dry needling;Therapeutic activities;Therapeutic exercise   PT Next Visit Plan Continue progressive strengthening of RLE, balance, gait, as well as breathing exercises.     Consulted and Agree with Plan of Care Patient      Patient will benefit from skilled therapeutic intervention in order to improve the following deficits and impairments:  Improper body mechanics, Pain, Decreased range of motion, Decreased mobility, Decreased strength, Abnormal gait, Decreased activity tolerance  Visit Diagnosis: Right knee pain  Abnormality of gait  Other symptoms and signs involving the musculoskeletal system     Problem List Patient Active Problem List   Diagnosis Date Noted  . Obesity 06/01/2016  . Wound dehiscence 05/04/2016  . Knee osteoarthritis 04/06/2016   Kerin Perna, PTA 07/22/16 9:16 AM  [facility] [place], [name], [address] Phone: [phone]   Fax:  [phone]  Name: Willie Pineda MRN: [id number] Date of Birth: [birth year]

## 2016-07-27 ENCOUNTER — Ambulatory Visit (INDEPENDENT_AMBULATORY_CARE_PROVIDER_SITE_OTHER): Payer: Medicare Other | Admitting: Physical Therapy

## 2016-07-27 DIAGNOSIS — R269 Unspecified abnormalities of gait and mobility: Secondary | ICD-10-CM

## 2016-07-27 DIAGNOSIS — M25561 Pain in right knee: Secondary | ICD-10-CM | POA: Diagnosis present

## 2016-07-27 DIAGNOSIS — I4891 Unspecified atrial fibrillation: Secondary | ICD-10-CM | POA: Diagnosis not present

## 2016-07-27 DIAGNOSIS — R29898 Other symptoms and signs involving the musculoskeletal system: Secondary | ICD-10-CM | POA: Diagnosis not present

## 2016-07-27 NOTE — Therapy (Signed)
[place], [name], [address] Phone: [phone]   Fax:  [phone]  Physical Therapy Treatment  Patient Details  Name: Willie Pineda MRN: [id number] Date of Birth: 1946-09-30 Referring Provider: Dr. Fredonia Highland  Encounter Date: 07/27/2016      PT End of Session - 07/27/16 0808    Visit Number 9   Number of Visits 24   Date for PT Re-Evaluation 09/14/16   PT Start Time 0805   PT Stop Time 5726   PT Time Calculation (min) 50 min   Activity Tolerance Patient tolerated treatment well;No increased pain      Past Medical History:  Diagnosis Date  . Arthritis    "hands primarily" (05/05/2016)  . Atrial fibrillation ([place])   . Back pain    age related  . CHF (congestive heart failure) (HCC)    takes Lasix daily  . Complication of anesthesia    bronchial spasms in ~ 2015; 2016; 2017  . COPD (chronic obstructive pulmonary disease) (HCC)    inhalers as needed  . Depression    takes Zoloft daily  . Dysrhythmia    A Fib-takes Xarelto daily  . Enlarged prostate    benign  . GERD (gastroesophageal reflux disease)    takes Omeprazole daily  . Glaucoma    mild  . 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 Hyperlipidemia    takes Atorvastatin daily  . Hypertension    takes Metoprolol,HCTZ,and Losartan daily.Takes Clonidine if needed  . Nocturia   . Peripheral neuropathy (HCC)    takes Gabapentin daily  . Pneumonia ?2015  . Restless leg   . Stroke [facility]) 2012   denies residual on 05/05/2016  . Type II diabetes mellitus ([place])    borderline but doesn't take any meds (05/05/2016)    Past Surgical History:  Procedure Laterality Date  . CATARACT EXTRACTION W/ INTRAOCULAR LENS  IMPLANT, BILATERAL Bilateral   . I&D KNEE WITH POLY EXCHANGE Right 05/04/2016   IRRIGATION AND DEBRIDEMENT Right KNEE WITH UNICOMPARTMENTAL POLY EXCHANGE  . I&D KNEE WITH POLY EXCHANGE Right  05/04/2016   Procedure: IRRIGATION AND DEBRIDEMENT Right KNEE WITH UNICOMPARTMENTAL POLY EXCHANGE;  Surgeon: Renette Butters, MD;  Location: [place];  Service: Orthopedics;  Laterality: Right;  . JOINT REPLACEMENT    . LAPAROSCOPIC CHOLECYSTECTOMY  1995  . PARTIAL KNEE ARTHROPLASTY Right 04/06/2016   Procedure: UNICOMPARTMENTAL RIGHT KNEE;  Surgeon: Renette Butters, MD;  Location: [place];  Service: Orthopedics;  Laterality: Right;  . TONSILLECTOMY AND ADENOIDECTOMY    . TOTAL KNEE ARTHROPLASTY Left 2008  . ULNAR NERVE TRANSPOSITION Right 1989    There were no vitals filed for this visit.      Subjective Assessment - 07/27/16 0808    Subjective Pt reports he is stiff from driving to see eclipse.  His Lt groin feels like he pulled something.  Visible scab over middle of incision, "I catch myself scratching my knee and that's what it could be from".     Pertinent History knee replacement Lt TKA; partial knee replacement Rt knee 04/20/16; brain stem CVA 2012; gall bladder removed   Patient Stated Goals get stronger and improve balance    Currently in Pain? Yes   Pain Score 3    Pain Location Knee   Pain Orientation Right   Aggravating Factors  inactivity    Pain Relieving Factors activity.  [facility] PT Assessment - 07/27/16 0001      Assessment   Medical Diagnosis Rt partial knee replacement    Referring Provider Dr. Fredonia Highland   Onset Date/Surgical Date 04/20/16   Hand Dominance Right   Next MD Visit 07/2017           [facility] Adult PT Treatment/Exercise - 07/27/16 0001      Exercises   Exercises Knee/Hip;Lumbar     Lumbar Exercises: Quadruped   Other Quadruped Lumbar Exercises Rt fire hydrant x 8 reps, repeated on LLE.      Knee/Hip Exercises: Stretches   Passive Hamstring Stretch 5 reps;10 seconds;Right   Gastroc Stretch Right;Left;2 reps;30 seconds   Soleus Stretch Right;Left;2 reps;30 seconds   Other Knee/Hip Stretches forward lunge with Rt knee to increased Rt  knee flexion x 10 sec hold x 5 reps     Knee/Hip Exercises: Aerobic   Nustep L5: 5 min      Knee/Hip Exercises: Standing   Gait Training simulated horse mounting x 15 reps ( pt swinging Rt leg over and straddling knee ext machine) - last 5 reps with Lt foot on grey pad)    Other Standing Knee Exercises Tandem walking forward/ backward 15 ft without UE support - (very challenging)    Other Standing Knee Exercises Standing adductor stretch x 10 sec x 3 reps each side.      Knee/Hip Exercises: Sidelying   Hip ABduction Right;12 reps;1 set  arcs, heel tap front/back. VC to stay on task, and for technique.      Astronomer IFC   Electrical Stimulation Parameters to tolerance    Electrical Stimulation Goals Edema;Pain     Vasopneumatic   Number Minutes Vasopneumatic  15 minutes   Vasopnuematic Location  Knee   Vasopneumatic Pressure Low   Vasopneumatic Temperature  3*                  PT Short Term Goals - 07/06/16 0900      PT SHORT TERM GOAL #1   Title Initiate HEP 07/06/16   Time 2   Period Weeks   Status Achieved     PT SHORT TERM GOAL #2   Title Increase AROM Rt knee to 0 deg extension to 110 deg flexion 07/30/16   Time 6   Period Weeks   Status Partially Met     PT SHORT TERM GOAL #3   Title Patient to demonstrate diaphragmatic breathing techniques without difficulty 07/30/16   Time 6   Period Weeks   Status Achieved     PT SHORT TERM GOAL #4   Title Improve gait pattern without assistive device 07/30/16   Time 6   Period Weeks   Status Achieved           PT Long Term Goals - 07/20/16 1320      PT LONG TERM GOAL #1   Title Increase gait to 20-30 min with no pain and no limp 09/14/16   Time 12   Period Weeks   Status Partially Met  has limp when initially beginning gait     PT LONG TERM GOAL #2   Title 5/5 stength Rt LE 09/14/16   Time 12   Period Weeks    Status Achieved     PT LONG TERM GOAL #3   Title Rt knee ROM 0 deg ext to 125-130 deg flexion 09/14/16  Time 12   Period Weeks   Status On-going     PT LONG TERM GOAL #4   Title Independent in HEP 09/14/16   Time 12   Period Weeks   Status On-going     PT LONG TERM GOAL #5   Title Improve FOTO to </= 36% limitation 09/14/16   Time 12   Period Weeks   Status On-going               Plan - 07/27/16 0840    Clinical Impression Statement Pt initially had difficulty with hip exercise simulating mounting horse, but form and ability improved with repetition. (Pt stated goal of returning to horse riding) Pt tolerated all exercises without increase in pain.  Tandem stance and gait continues to be challenging exercise. Pt making good gains towards unmet goals.    Rehab Potential Good   PT Frequency 2x / week   PT Duration 12 weeks   PT Treatment/Interventions Patient/family education;ADLs/Self Care Home Management;Cryotherapy;Electrical Stimulation;Iontophoresis '4mg'$ /ml Dexamethasone;Moist Heat;Ultrasound;Manual techniques;Dry needling;Therapeutic activities;Therapeutic exercise   PT Next Visit Plan Continue progressive strengthening of RLE, balance, gait, as well as breathing exercises.     Consulted and Agree with Plan of Care Patient      Patient will benefit from skilled therapeutic intervention in order to improve the following deficits and impairments:  Improper body mechanics, Pain, Decreased range of motion, Decreased mobility, Decreased strength, Abnormal gait, Decreased activity tolerance  Visit Diagnosis: Right knee pain  Abnormality of gait  Other symptoms and signs involving the musculoskeletal system     Problem List Patient Active Problem List   Diagnosis Date Noted  . Obesity 06/01/2016  . Wound dehiscence 05/04/2016  . Knee osteoarthritis 04/06/2016   Kerin Perna, PTA 07/27/16 8:43 AM  [facility] [place], [name], [address] Phone: [phone]   Fax:  [phone]  Name: MACLIN GUERRETTE MRN: [id number] Date of Birth: Sep 22, 1946

## 2016-07-29 ENCOUNTER — Ambulatory Visit (INDEPENDENT_AMBULATORY_CARE_PROVIDER_SITE_OTHER): Payer: Medicare Other | Admitting: Physical Therapy

## 2016-07-29 DIAGNOSIS — R29898 Other symptoms and signs involving the musculoskeletal system: Secondary | ICD-10-CM | POA: Diagnosis not present

## 2016-07-29 DIAGNOSIS — R269 Unspecified abnormalities of gait and mobility: Secondary | ICD-10-CM | POA: Diagnosis not present

## 2016-07-29 DIAGNOSIS — M25561 Pain in right knee: Secondary | ICD-10-CM | POA: Diagnosis present

## 2016-07-29 NOTE — Therapy (Signed)
[place], [name], [address] Phone: [phone]   Fax:  [phone]  Physical Therapy Treatment  Patient Details  Name: Willie Pineda MRN: [id number] Date of Birth: 1946/12/03 Referring Provider: Dr. Fredonia Highland  Encounter Date: 07/29/2016      PT End of Session - 07/29/16 0807    Visit Number 10   Number of Visits 24   Date for PT Re-Evaluation 09/14/16   PT Start Time 0803   PT Stop Time 0858   PT Time Calculation (min) 55 min      Past Medical History:  Diagnosis Date  . Arthritis    "hands primarily" (05/05/2016)  . Atrial fibrillation ([place])   . Back pain    age related  . CHF (congestive heart failure) (HCC)    takes Lasix daily  . Complication of anesthesia    bronchial spasms in ~ 2015; 2016; 2017  . COPD (chronic obstructive pulmonary disease) (HCC)    inhalers as needed  . Depression    takes Zoloft daily  . Dysrhythmia    A Fib-takes Xarelto daily  . Enlarged prostate    benign  . GERD (gastroesophageal reflux disease)    takes Omeprazole daily  . Glaucoma    mild  . 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 Hyperlipidemia    takes Atorvastatin daily  . Hypertension    takes Metoprolol,HCTZ,and Losartan daily.Takes Clonidine if needed  . Nocturia   . Peripheral neuropathy (HCC)    takes Gabapentin daily  . Pneumonia ?2015  . Restless leg   . Stroke [facility]) 2012   denies residual on 05/05/2016  . Type II diabetes mellitus ([place])    borderline but doesn't take any meds (05/05/2016)    Past Surgical History:  Procedure Laterality Date  . CATARACT EXTRACTION W/ INTRAOCULAR LENS  IMPLANT, BILATERAL Bilateral   . I&D KNEE WITH POLY EXCHANGE Right 05/04/2016   IRRIGATION AND DEBRIDEMENT Right KNEE WITH UNICOMPARTMENTAL POLY EXCHANGE  . I&D KNEE WITH POLY EXCHANGE Right 05/04/2016   Procedure: IRRIGATION AND DEBRIDEMENT Right KNEE WITH  UNICOMPARTMENTAL POLY EXCHANGE;  Surgeon: Renette Butters, MD;  Location: [place];  Service: Orthopedics;  Laterality: Right;  . JOINT REPLACEMENT    . LAPAROSCOPIC CHOLECYSTECTOMY  1995  . PARTIAL KNEE ARTHROPLASTY Right 04/06/2016   Procedure: UNICOMPARTMENTAL RIGHT KNEE;  Surgeon: Renette Butters, MD;  Location: [place];  Service: Orthopedics;  Laterality: Right;  . TONSILLECTOMY AND ADENOIDECTOMY    . TOTAL KNEE ARTHROPLASTY Left 2008  . ULNAR NERVE TRANSPOSITION Right 1989    There were no vitals filed for this visit.      Subjective Assessment - 07/29/16 0804    Subjective Pt states he woke up this morning with increased pain in Rt hip.  Yesterday his Rt hip pain was 3/10. He walked and was in pool and feels all the extra exercise may have caught up with him.  Rt knee hasn't hurt since last visit.  Visible new scab over middle of Rt knee incision.    Pertinent History knee replacement Lt TKA; partial knee replacement Rt knee 04/20/16; brain stem CVA 2012; gall bladder removed   Currently in Pain? Yes   Pain Score 8    Pain Location Hip   Pain Orientation Right   Pain Descriptors / Indicators Sharp;Cramping   Aggravating Factors  initial movement in day  Pain Relieving Factors movement.             [facility] PT Assessment - 07/29/16 0001      Assessment   Medical Diagnosis Rt partial knee replacement    Referring Provider Dr. Fredonia Highland   Onset Date/Surgical Date 04/20/16   Hand Dominance Right   Next MD Visit 07/2017     Observation/Other Assessments   Focus on Therapeutic Outcomes (FOTO)  34% limited             [facility] Adult PT Treatment/Exercise - 07/29/16 0001      Knee/Hip Exercises: Stretches   Passive Hamstring Stretch Right;4 reps;20 seconds   Piriformis Stretch 4 reps;20 seconds   Gastroc Stretch Right;Left;3 reps;20 seconds   Other Knee/Hip Stretches forward lunge with Rt knee to increased Rt knee flexion x 10 sec hold x 5 reps     Knee/Hip Exercises:  Aerobic   Nustep L5: 5 min      Knee/Hip Exercises: Standing   Other Standing Knee Exercises Standing adductor stretch x 10 sec x 3 reps each side.      Knee/Hip Exercises: Supine   Bridges with Ball Squeeze 1 set;10 reps   Other Supine Knee/Hip Exercises butterfly adductor stretch x 15 sec x 5 reps (overpressure from PTA).      Theme park manager Rt hip    Chartered certified accountant IFC   Electrical Stimulation Parameters to tolerance    Electrical Stimulation Goals Pain     Vasopneumatic   Number Minutes Vasopneumatic  15 minutes   Vasopnuematic Location  Knee   Vasopneumatic Pressure Low   Vasopneumatic Temperature  3*     Manual Therapy   Manual Therapy Soft tissue mobilization   Manual therapy comments Pt in Lt sidelying   Soft tissue mobilization TPR to Rt glute med, piriformis with contract / relax.             PT Short Term Goals - 07/06/16 0900      PT SHORT TERM GOAL #1   Title Initiate HEP 07/06/16   Time 2   Period Weeks   Status Achieved     PT SHORT TERM GOAL #2   Title Increase AROM Rt knee to 0 deg extension to 110 deg flexion 07/30/16   Time 6   Period Weeks   Status Partially Met     PT SHORT TERM GOAL #3   Title Patient to demonstrate diaphragmatic breathing techniques without difficulty 07/30/16   Time 6   Period Weeks   Status Achieved     PT SHORT TERM GOAL #4   Title Improve gait pattern without assistive device 07/30/16   Time 6   Period Weeks   Status Achieved           PT Long Term Goals - 07/20/16 1320      PT LONG TERM GOAL #1   Title Increase gait to 20-30 min with no pain and no limp 09/14/16   Time 12   Period Weeks   Status Partially Met  has limp when initially beginning gait     PT LONG TERM GOAL #2   Title 5/5 stength Rt LE 09/14/16   Time 12   Period Weeks   Status Achieved     PT LONG TERM GOAL #3   Title Rt knee ROM 0 deg ext to 125-130 deg flexion 09/14/16   Time  12   Period Weeks   Status On-going  PT LONG TERM GOAL #4   Title Independent in HEP 09/14/16   Time 12   Period Weeks   Status On-going     PT LONG TERM GOAL #5   Title Improve FOTO to </= 36% limitation 09/14/16   Time 12   Period Weeks   Status On-going               Plan - 07/29/16 1304    Clinical Impression Statement Pt reported 8/10 pain in Rt hip upon arrival. Pain was reduced with stretches, manual therapy and MHP/estim.  Pt's pain had reduced to 2/10 at end of session.  Pt reports 50% improvement in Rt  knee function and 80% in overall well-being since initiation of therapy.    Rehab Potential Good   PT Frequency 2x / week   PT Duration 12 weeks   PT Treatment/Interventions Patient/family education;ADLs/Self Care Home Management;Cryotherapy;Electrical Stimulation;Iontophoresis '4mg'$ /ml Dexamethasone;Moist Heat;Ultrasound;Manual techniques;Dry needling;Therapeutic activities;Therapeutic exercise   PT Next Visit Plan Continue progressive strengthening of RLE, balance, gait, as well as breathing exercises.     Consulted and Agree with Plan of Care Patient      Patient will benefit from skilled therapeutic intervention in order to improve the following deficits and impairments:  Improper body mechanics, Pain, Decreased range of motion, Decreased mobility, Decreased strength, Abnormal gait, Decreased activity tolerance  Visit Diagnosis: Right knee pain  Abnormality of gait  Other symptoms and signs involving the musculoskeletal system     Problem List Patient Active Problem List   Diagnosis Date Noted  . Obesity 06/01/2016  . Wound dehiscence 05/04/2016  . Knee osteoarthritis 04/06/2016   Kerin Perna, PTA 07/29/16 1:06 PM  [place], [name], [address] Phone: [phone]   Fax:  [phone]  Name: Willie Pineda MRN: [id number] Date of Birth:  1946/02/01

## 2016-07-30 NOTE — Addendum Note (Signed)
Addended by: Everardo All on: 07/30/2016 01:02 PM   Modules accepted: Orders

## 2016-08-03 ENCOUNTER — Ambulatory Visit (INDEPENDENT_AMBULATORY_CARE_PROVIDER_SITE_OTHER): Payer: Medicare Other | Admitting: Physical Therapy

## 2016-08-03 DIAGNOSIS — M25561 Pain in right knee: Secondary | ICD-10-CM | POA: Diagnosis present

## 2016-08-03 DIAGNOSIS — R29898 Other symptoms and signs involving the musculoskeletal system: Secondary | ICD-10-CM | POA: Diagnosis not present

## 2016-08-03 DIAGNOSIS — R269 Unspecified abnormalities of gait and mobility: Secondary | ICD-10-CM | POA: Diagnosis not present

## 2016-08-03 NOTE — Therapy (Signed)
[place], [name], [address] Phone: [phone]   Fax:  [phone]  Physical Therapy Treatment  Patient Details  Name: Willie Pineda MRN: [id number] Date of Birth: Jan 01, 1946 Referring Provider: Dr. Fredonia Highland  Encounter Date: 08/03/2016      PT End of Session - 08/03/16 0807    Visit Number 11   Number of Visits 24   Date for PT Re-Evaluation 09/14/16   PT Start Time 0804   PT Stop Time 0900   PT Time Calculation (min) 56 min   Activity Tolerance Patient tolerated treatment well;No increased pain      Past Medical History:  Diagnosis Date  . Arthritis    "hands primarily" (05/05/2016)  . Atrial fibrillation ([place])   . Back pain    age related  . CHF (congestive heart failure) (HCC)    takes Lasix daily  . Complication of anesthesia    bronchial spasms in ~ 2015; 2016; 2017  . COPD (chronic obstructive pulmonary disease) (HCC)    inhalers as needed  . Depression    takes Zoloft daily  . Dysrhythmia    A Fib-takes Xarelto daily  . Enlarged prostate    benign  . GERD (gastroesophageal reflux disease)    takes Omeprazole daily  . Glaucoma    mild  . 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 Hyperlipidemia    takes Atorvastatin daily  . Hypertension    takes Metoprolol,HCTZ,and Losartan daily.Takes Clonidine if needed  . Nocturia   . Peripheral neuropathy (HCC)    takes Gabapentin daily  . Pneumonia ?2015  . Restless leg   . Stroke [facility]) 2012   denies residual on 05/05/2016  . Type II diabetes mellitus ([place])    borderline but doesn't take any meds (05/05/2016)    Past Surgical History:  Procedure Laterality Date  . CATARACT EXTRACTION W/ INTRAOCULAR LENS  IMPLANT, BILATERAL Bilateral   . I&D KNEE WITH POLY EXCHANGE Right 05/04/2016   IRRIGATION AND DEBRIDEMENT Right KNEE WITH UNICOMPARTMENTAL POLY EXCHANGE  . I&D KNEE WITH POLY EXCHANGE  Right 05/04/2016   Procedure: IRRIGATION AND DEBRIDEMENT Right KNEE WITH UNICOMPARTMENTAL POLY EXCHANGE;  Surgeon: Renette Butters, MD;  Location: [place];  Service: Orthopedics;  Laterality: Right;  . JOINT REPLACEMENT    . LAPAROSCOPIC CHOLECYSTECTOMY  1995  . PARTIAL KNEE ARTHROPLASTY Right 04/06/2016   Procedure: UNICOMPARTMENTAL RIGHT KNEE;  Surgeon: Renette Butters, MD;  Location: [place];  Service: Orthopedics;  Laterality: Right;  . TONSILLECTOMY AND ADENOIDECTOMY    . TOTAL KNEE ARTHROPLASTY Left 2008  . ULNAR NERVE TRANSPOSITION Right 1989    There were no vitals filed for this visit.      Subjective Assessment - 08/03/16 0807    Subjective Pt reports he has been performing stretches for hip in morning before he gets out of bed and it has helped.  He feels he has a dry patch in incision; has been applying lotion to help this.     Pertinent History knee replacement Lt TKA; partial knee replacement Rt knee 04/20/16; brain stem CVA 2012; gall bladder removed   Currently in Pain? Yes   Pain Score 3    Pain Location Hip   Pain Orientation Right   Pain Descriptors / Indicators Cramping;Sore   Aggravating Factors  initial movement in day   Pain Relieving Factors movement  [facility] PT Assessment - 08/03/16 0001      Assessment   Medical Diagnosis Rt partial knee replacement    Referring Provider Dr. Fredonia Highland   Onset Date/Surgical Date 04/20/16   Hand Dominance Right   Next MD Visit 07/2017     AROM   Right Knee Flexion 121     Flexibility   Quadriceps Prone:  120 deg Rt, 125 deg Lt.            [place] Adult PT Treatment/Exercise - 08/03/16 0001      Knee/Hip Exercises: Stretches   Passive Hamstring Stretch Right;Left;3 reps;20 seconds   Hip Flexor Stretch Right;3 reps;20 seconds   Piriformis Stretch Right;Left;3 reps;30 seconds   Gastroc Stretch Right;Left;3 reps;20 seconds     Knee/Hip Exercises: Aerobic   Nustep L5: 5 min      Knee/Hip Exercises:  Standing   Hip Abduction Stengthening;Right;Left;2 sets;5 reps;Knee bent  (simulating mounting horse)   Lateral Step Up Hand Hold: 1;5 reps;Right;Left  10" including gray pad   Forward Step Up Hand Hold: 1;Right;Left;1 set;5 reps  10" including gray pad   Lunge Walking - Round Trips (mini lunge) 50 ft    Other Standing Knee Exercises Tandem walking forward/ backward 24 ft with light RUE support (improved);  braiding without UE support x 25 ft each direction   Other Standing Knee Exercises Standing adductor stretch x 10 sec x 3 reps each side.      Vasopneumatic   Number Minutes Vasopneumatic  15 minutes   Vasopnuematic Location  Knee   Vasopneumatic Pressure Low   Vasopneumatic Temperature  3*                  PT Short Term Goals - 07/06/16 0900      PT SHORT TERM GOAL #1   Title Initiate HEP 07/06/16   Time 2   Period Weeks   Status Achieved     PT SHORT TERM GOAL #2   Title Increase AROM Rt knee to 0 deg extension to 110 deg flexion 07/30/16   Time 6   Period Weeks   Status Partially Met     PT SHORT TERM GOAL #3   Title Patient to demonstrate diaphragmatic breathing techniques without difficulty 07/30/16   Time 6   Period Weeks   Status Achieved     PT SHORT TERM GOAL #4   Title Improve gait pattern without assistive device 07/30/16   Time 6   Period Weeks   Status Achieved           PT Long Term Goals - 07/20/16 1320      PT LONG TERM GOAL #1   Title Increase gait to 20-30 min with no pain and no limp 09/14/16   Time 12   Period Weeks   Status Partially Met  has limp when initially beginning gait     PT LONG TERM GOAL #2   Title 5/5 stength Rt LE 09/14/16   Time 12   Period Weeks   Status Achieved     PT LONG TERM GOAL #3   Title Rt knee ROM 0 deg ext to 125-130 deg flexion 09/14/16   Time 12   Period Weeks   Status On-going     PT LONG TERM GOAL #4   Title Independent in HEP 09/14/16   Time 12   Period Weeks   Status On-going      PT LONG TERM GOAL #5   Title Improve  FOTO to </= 36% limitation 09/14/16   Time 12   Period Weeks   Status On-going               Plan - 08/03/16 0850    Clinical Impression Statement Pt reported hip / groin pain was eliminated with exercise today.  He is making gains with flexibility; improved quad flexibility by 5 deg.  Pt's balance improving as well.     Rehab Potential Good   PT Frequency 2x / week   PT Duration 12 weeks   PT Treatment/Interventions Patient/family education;ADLs/Self Care Home Management;Cryotherapy;Electrical Stimulation;Iontophoresis 53m/ml Dexamethasone;Moist Heat;Ultrasound;Manual techniques;Dry needling;Therapeutic activities;Therapeutic exercise   PT Next Visit Plan Continue progressive strengthening of RLE, balance, gait, as well as breathing exercises.     Consulted and Agree with Plan of Care Patient      Patient will benefit from skilled therapeutic intervention in order to improve the following deficits and impairments:  Improper body mechanics, Pain, Decreased range of motion, Decreased mobility, Decreased strength, Abnormal gait, Decreased activity tolerance  Visit Diagnosis: Right knee pain  Abnormality of gait  Other symptoms and signs involving the musculoskeletal system     Problem List Patient Active Problem List   Diagnosis Date Noted  . Obesity 06/01/2016  . Wound dehiscence 05/04/2016  . Knee osteoarthritis 04/06/2016   JKerin Perna PTA 08/03/16 11:03 AM  [place] [address]Phone: [phone]  Fax:  [phone] Name: Willie BLANFORDMRN: [id number] of Birth: [birth year]

## 2016-08-04 DIAGNOSIS — N451 Epididymitis: Secondary | ICD-10-CM | POA: Diagnosis not present

## 2016-08-04 DIAGNOSIS — R350 Frequency of micturition: Secondary | ICD-10-CM | POA: Diagnosis not present

## 2016-08-05 ENCOUNTER — Ambulatory Visit (INDEPENDENT_AMBULATORY_CARE_PROVIDER_SITE_OTHER): Payer: Medicare Other | Admitting: Physical Therapy

## 2016-08-05 DIAGNOSIS — R29898 Other symptoms and signs involving the musculoskeletal system: Secondary | ICD-10-CM

## 2016-08-05 DIAGNOSIS — M25561 Pain in right knee: Secondary | ICD-10-CM | POA: Diagnosis present

## 2016-08-05 DIAGNOSIS — R269 Unspecified abnormalities of gait and mobility: Secondary | ICD-10-CM

## 2016-08-05 NOTE — Therapy (Signed)
[place], [name], [address] Phone: [phone]   Fax:  [phone]  Physical Therapy Treatment  Patient Details  Name: Willie Pineda MRN: [id number] Date of Birth: 1946-08-03 Referring Provider: Dr. Fredonia Highland  Encounter Date: 08/05/2016      PT End of Session - 08/05/16 0808    Visit Number 12   Number of Visits 24   Date for PT Re-Evaluation 09/14/16   PT Start Time 0808   PT Stop Time 0908   PT Time Calculation (min) 60 min   Activity Tolerance Patient tolerated treatment well;No increased pain   Behavior During Therapy WFL for tasks assessed/performed      Past Medical History:  Diagnosis Date  . Arthritis    "hands primarily" (05/05/2016)  . Atrial fibrillation ([place])   . Back pain    age related  . CHF (congestive heart failure) (HCC)    takes Lasix daily  . Complication of anesthesia    bronchial spasms in ~ 2015; 2016; 2017  . COPD (chronic obstructive pulmonary disease) (HCC)    inhalers as needed  . Depression    takes Zoloft daily  . Dysrhythmia    A Fib-takes Xarelto daily  . Enlarged prostate    benign  . GERD (gastroesophageal reflux disease)    takes Omeprazole daily  . Glaucoma    mild  . 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 Hyperlipidemia    takes Atorvastatin daily  . Hypertension    takes Metoprolol,HCTZ,and Losartan daily.Takes Clonidine if needed  . Nocturia   . Peripheral neuropathy (HCC)    takes Gabapentin daily  . Pneumonia ?2015  . Restless leg   . Stroke [facility]) 2012   denies residual on 05/05/2016  . Type II diabetes mellitus ([place])    borderline but doesn't take any meds (05/05/2016)    Past Surgical History:  Procedure Laterality Date  . CATARACT EXTRACTION W/ INTRAOCULAR LENS  IMPLANT, BILATERAL Bilateral   . I&D KNEE WITH POLY EXCHANGE Right 05/04/2016   IRRIGATION AND DEBRIDEMENT Right KNEE WITH  UNICOMPARTMENTAL POLY EXCHANGE  . I&D KNEE WITH POLY EXCHANGE Right 05/04/2016   Procedure: IRRIGATION AND DEBRIDEMENT Right KNEE WITH UNICOMPARTMENTAL POLY EXCHANGE;  Surgeon: Renette Butters, MD;  Location: [place];  Service: Orthopedics;  Laterality: Right;  . JOINT REPLACEMENT    . LAPAROSCOPIC CHOLECYSTECTOMY  1995  . PARTIAL KNEE ARTHROPLASTY Right 04/06/2016   Procedure: UNICOMPARTMENTAL RIGHT KNEE;  Surgeon: Renette Butters, MD;  Location: [place];  Service: Orthopedics;  Laterality: Right;  . TONSILLECTOMY AND ADENOIDECTOMY    . TOTAL KNEE ARTHROPLASTY Left 2008  . ULNAR NERVE TRANSPOSITION Right 1989    There were no vitals filed for this visit.      Subjective Assessment - 08/05/16 0809    Subjective Pt reports his Rt hip is now a 0/10, but has a little pain in Rt knee.  He did some walking on treadmill and with shopping. "I may have overdone it".     Pertinent History knee replacement Lt TKA; partial knee replacement Rt knee 04/20/16; brain stem CVA 2012; gall bladder removed   Patient Stated Goals get stronger and improve balance    Currently in Pain? Yes   Pain Score 2    Pain Location Knee   Pain Orientation Right   Pain Descriptors / Indicators Sore   Aggravating Factors  initial movement in day   Pain Relieving Factors movement.             [facility] PT Assessment - 08/05/16 0001      Assessment   Medical Diagnosis Rt partial knee replacement    Referring Provider Dr. Fredonia Highland   Onset Date/Surgical Date 04/20/16   Hand Dominance Right   Next MD Visit 07/2017     Strength   Strength Assessment Site Knee;Ankle   Right/Left Ankle Right;Left   Right Ankle Plantar Flexion 4+/5  15 heel raises, decreased heel height.    Left Ankle Plantar Flexion 5/5          [facility] Adult PT Treatment/Exercise - 08/05/16 0001      Knee/Hip Exercises: Stretches   Sports administrator Right;Left;2 reps;60 seconds   Piriformis Stretch Right;Left;3 reps;30 seconds   Gastroc Stretch  Right;Left;3 reps;20 seconds   Soleus Stretch Right;Left;2 reps;30 seconds     Knee/Hip Exercises: Aerobic   Nustep L5: 5 min      Knee/Hip Exercises: Standing   Heel Raises Right;Left;1 set;15 reps   Heel Raises Limitations (single leg; easier and higher heel height on LLE)   Forward Step Up Right;Left;5 reps;Hand Hold: 2;2 sets  13" including gray pad   Functional Squat 10 reps;2 sets  feet on blue pads, simulating posting on horse.    Other Standing Knee Exercises Tandem walking forward/ backward 24 ft with light RUE support (improved);  braiding without UE support x 25 ft each direction.  Tandem stance on 1/2 foam roll x 30 sec each side (occasional UE support);  Balance on 1/2 foam roll x 30 sec    Other Standing Knee Exercises tandem stance with horizontal head turns x 30 sec x 2 reps, LOB corrected with  UE support (x 4) - much improved from initial attempt. simulated horse mounting x 5 reps ( pt straddling knee ext machine) - with Lt foot on grey pad)      Vasopneumatic   Number Minutes Vasopneumatic  15 minutes   Vasopnuematic Location  Knee   Vasopneumatic Pressure Low   Vasopneumatic Temperature  3*           PT Short Term Goals - 07/06/16 0900      PT SHORT TERM GOAL #1   Title Initiate HEP 07/06/16   Time 2   Period Weeks   Status Achieved     PT SHORT TERM GOAL #2   Title Increase AROM Rt knee to 0 deg extension to 110 deg flexion 07/30/16   Time 6   Period Weeks   Status Partially Met     PT SHORT TERM GOAL #3   Title Patient to demonstrate diaphragmatic breathing techniques without difficulty 07/30/16   Time 6   Period Weeks   Status Achieved     PT SHORT TERM GOAL #4   Title Improve gait pattern without assistive device 07/30/16   Time 6   Period Weeks   Status Achieved           PT Long Term Goals - 07/20/16 1320      PT LONG TERM GOAL #1   Title Increase gait to 20-30 min with no pain and no limp 09/14/16   Time 12   Period Weeks   Status  Partially Met  has limp when initially beginning gait     PT LONG TERM GOAL #2   Title 5/5 stength Rt LE 09/14/16   Time 12   Period  Weeks   Status Achieved     PT LONG TERM GOAL #3   Title Rt knee ROM 0 deg ext to 125-130 deg flexion 09/14/16   Time 12   Period Weeks   Status On-going     PT LONG TERM GOAL #4   Title Independent in HEP 09/14/16   Time 12   Period Weeks   Status On-going     PT LONG TERM GOAL #5   Title Improve FOTO to </= 36% limitation 09/14/16   Time 12   Period Weeks   Status On-going               Plan - 08/05/16 0858    Rehab Potential Good   PT Frequency 2x / week   PT Duration 12 weeks   PT Treatment/Interventions Patient/family education;ADLs/Self Care Home Management;Cryotherapy;Electrical Stimulation;Iontophoresis '4mg'$ /ml Dexamethasone;Moist Heat;Ultrasound;Manual techniques;Dry needling;Therapeutic activities;Therapeutic exercise   PT Next Visit Plan [place] and manual therapy to Rt hamstring; work on increased Rt knee extension.    Consulted and Agree with Plan of Care Patient      Patient will benefit from skilled therapeutic intervention in order to improve the following deficits and impairments:  Improper body mechanics, Pain, Decreased range of motion, Decreased mobility, Decreased strength, Abnormal gait, Decreased activity tolerance  Visit Diagnosis: Right knee pain  Abnormality of gait  Other symptoms and signs involving the musculoskeletal system     Problem List Patient Active Problem List   Diagnosis Date Noted  . Obesity 06/01/2016  . Wound dehiscence 05/04/2016  . Knee osteoarthritis 04/06/2016   Kerin Perna, PTA 08/05/16 8:59 AM  [facility] [place], [name], [address] Phone: [phone]   Fax:  [phone]  Name: Willie Pineda MRN: [id number] Date of Birth: [birth year]

## 2016-08-12 ENCOUNTER — Encounter: Payer: Medicare Other | Admitting: Physical Therapy

## 2016-08-17 ENCOUNTER — Ambulatory Visit (INDEPENDENT_AMBULATORY_CARE_PROVIDER_SITE_OTHER): Payer: Medicare Other | Admitting: Physical Therapy

## 2016-08-17 DIAGNOSIS — M25561 Pain in right knee: Secondary | ICD-10-CM

## 2016-08-17 DIAGNOSIS — R269 Unspecified abnormalities of gait and mobility: Secondary | ICD-10-CM | POA: Diagnosis not present

## 2016-08-17 DIAGNOSIS — R29898 Other symptoms and signs involving the musculoskeletal system: Secondary | ICD-10-CM

## 2016-08-17 NOTE — Therapy (Signed)
[place], [name], [address] Phone: [phone]   Fax:  [phone]  Physical Therapy Treatment  Patient Details  Name: Willie Pineda MRN: [id number] Date of Birth: 1946-02-25 Referring Provider: Dr. Fredonia Highland  Encounter Date: 08/17/2016      PT End of Session - 08/17/16 0827    Visit Number 13   Number of Visits 24   Date for PT Re-Evaluation 09/14/16   PT Start Time 0815  pt arrived late   PT Stop Time 0849   PT Time Calculation (min) 34 min   Activity Tolerance Patient tolerated treatment well;No increased pain   Behavior During Therapy WFL for tasks assessed/performed      Past Medical History:  Diagnosis Date  . Arthritis    "hands primarily" (05/05/2016)  . Atrial fibrillation ([place])   . Back pain    age related  . CHF (congestive heart failure) (HCC)    takes Lasix daily  . Complication of anesthesia    bronchial spasms in ~ 2015; 2016; 2017  . COPD (chronic obstructive pulmonary disease) (HCC)    inhalers as needed  . Depression    takes Zoloft daily  . Dysrhythmia    A Fib-takes Xarelto daily  . Enlarged prostate    benign  . GERD (gastroesophageal reflux disease)    takes Omeprazole daily  . Glaucoma    mild  . 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 Hyperlipidemia    takes Atorvastatin daily  . Hypertension    takes Metoprolol,HCTZ,and Losartan daily.Takes Clonidine if needed  . Nocturia   . Peripheral neuropathy (HCC)    takes Gabapentin daily  . Pneumonia ?2015  . Restless leg   . Stroke [facility]) 2012   denies residual on 05/05/2016  . Type II diabetes mellitus ([place])    borderline but doesn't take any meds (05/05/2016)    Past Surgical History:  Procedure Laterality Date  . CATARACT EXTRACTION W/ INTRAOCULAR LENS  IMPLANT, BILATERAL Bilateral   . I&D KNEE WITH POLY EXCHANGE Right 05/04/2016   IRRIGATION AND DEBRIDEMENT Right  KNEE WITH UNICOMPARTMENTAL POLY EXCHANGE  . I&D KNEE WITH POLY EXCHANGE Right 05/04/2016   Procedure: IRRIGATION AND DEBRIDEMENT Right KNEE WITH UNICOMPARTMENTAL POLY EXCHANGE;  Surgeon: Renette Butters, MD;  Location: [place];  Service: Orthopedics;  Laterality: Right;  . JOINT REPLACEMENT    . LAPAROSCOPIC CHOLECYSTECTOMY  1995  . PARTIAL KNEE ARTHROPLASTY Right 04/06/2016   Procedure: UNICOMPARTMENTAL RIGHT KNEE;  Surgeon: Renette Butters, MD;  Location: [place];  Service: Orthopedics;  Laterality: Right;  . TONSILLECTOMY AND ADENOIDECTOMY    . TOTAL KNEE ARTHROPLASTY Left 2008  . ULNAR NERVE TRANSPOSITION Right 1989    There were no vitals filed for this visit.      Subjective Assessment - 08/17/16 0822    Subjective Pt was sick with stomach bug over last few days.  He did complete some stretches, but hasn't done much other wise.  His groin is no longer painful or tight.  He has not had a chance to go horse riding yet.    Pertinent History knee replacement Lt TKA; partial knee replacement Rt knee 04/20/16; brain stem CVA 2012; gall bladder removed   Currently in Pain? Yes   Pain Score 3    Pain Location Knee   Pain Orientation Right   Pain Descriptors / Indicators Dull  Aggravating Factors  initial movement in day   Pain Relieving Factors movement, stretches.             [facility] PT Assessment - 08/17/16 0001      Assessment   Medical Diagnosis Rt partial knee replacement    Referring Provider Dr. Fredonia Highland   Onset Date/Surgical Date 04/20/16   Hand Dominance Right   Next MD Visit 07/2017     AROM   Right/Left Knee Right   Right Knee Extension -4     Flexibility   Quadriceps Prone: 120 deg Rt.            [facility] Adult PT Treatment/Exercise - 08/17/16 0001      Knee/Hip Exercises: Stretches   Sports administrator Right;Left;2 reps;60 seconds   Piriformis Stretch Right;Left;3 reps;30 seconds   Gastroc Stretch Right;Left;3 reps;20 seconds     Knee/Hip Exercises: Aerobic    Nustep L5: 5 min      Knee/Hip Exercises: Standing   Forward Step Up Right;Left;5 reps;Hand Hold: 2;2 sets  13" including gray pad   Functional Squat 10 reps;2 sets  feet on blue pads, simulating posting on horse.    Other Standing Knee Exercises  simulated horse mounting x  reps ( pt straddling knee ext machine) - with Lt foot on grey pad)      Modalities   Modalities Ultrasound     Ultrasound   Ultrasound Location Rt distal hamstring    Ultrasound Parameters 100%, 1.1 w/cm2, 8 min    Ultrasound Goals Other (Comment)  tightness     Manual Therapy   Manual Therapy Soft tissue mobilization   Soft tissue mobilization muscle stripping, TPR to Rt medial/lateral hamstring    Passive ROM Rt knee into extension x 30 sec x 5 reps                  PT Short Term Goals - 07/06/16 0900      PT SHORT TERM GOAL #1   Title Initiate HEP 07/06/16   Time 2   Period Weeks   Status Achieved     PT SHORT TERM GOAL #2   Title Increase AROM Rt knee to 0 deg extension to 110 deg flexion 07/30/16   Time 6   Period Weeks   Status Partially Met     PT SHORT TERM GOAL #3   Title Patient to demonstrate diaphragmatic breathing techniques without difficulty 07/30/16   Time 6   Period Weeks   Status Achieved     PT SHORT TERM GOAL #4   Title Improve gait pattern without assistive device 07/30/16   Time 6   Period Weeks   Status Achieved           PT Long Term Goals - 07/20/16 1320      PT LONG TERM GOAL #1   Title Increase gait to 20-30 min with no pain and no limp 09/14/16   Time 12   Period Weeks   Status Partially Met  has limp when initially beginning gait     PT LONG TERM GOAL #2   Title 5/5 stength Rt LE 09/14/16   Time 12   Period Weeks   Status Achieved     PT LONG TERM GOAL #3   Title Rt knee ROM 0 deg ext to 125-130 deg flexion 09/14/16   Time 12   Period Weeks   Status On-going     PT LONG TERM GOAL #4   Title Independent  in HEP 09/14/16   Time 12    Period Weeks   Status On-going     PT LONG TERM GOAL #5   Title Improve FOTO to </= 36% limitation 09/14/16   Time 12   Period Weeks   Status On-going               Plan - 08/17/16 6945    Clinical Impression Statement Pt demonstrated slight decrease in Rt knee extension.  He reported elimination in Rt knee pain during 5 min on NuStep. Pt tolerated treatment without production or increase in pain.  Pt's treatment was shortened due to his late arrival.    Rehab Potential Good   PT Frequency 2x / week   PT Duration 12 weeks   PT Treatment/Interventions Patient/family education;ADLs/Self Care Home Management;Cryotherapy;Electrical Stimulation;Iontophoresis 49m/ml Dexamethasone;Moist Heat;Ultrasound;Manual techniques;Dry needling;Therapeutic activities;Therapeutic exercise   PT Next Visit Plan [place] manual therapy to Rt hamstring; work on increased Rt knee extension.    Consulted and Agree with Plan of Care Patient      Patient will benefit from skilled therapeutic intervention in order to improve the following deficits and impairments:  Improper body mechanics, Pain, Decreased range of motion, Decreased mobility, Decreased strength, Abnormal gait, Decreased activity tolerance  Visit Diagnosis: Right knee pain  Abnormality of gait  Other symptoms and signs involving the musculoskeletal system     Problem List Patient Active Problem List   Diagnosis Date Noted  . Obesity 06/01/2016  . Wound dehiscence 05/04/2016  . Knee osteoarthritis 04/06/2016   JKerin Perna PTA 08/17/16 8:57 AM  [facility] [address]Phone: [phone]  Fax:  [phone] Name: Willie WHISENANTMRN: [id number] of Birth: 1October 27, 1947

## 2016-08-19 ENCOUNTER — Ambulatory Visit (INDEPENDENT_AMBULATORY_CARE_PROVIDER_SITE_OTHER): Payer: Medicare Other | Admitting: Physical Therapy

## 2016-08-19 DIAGNOSIS — R269 Unspecified abnormalities of gait and mobility: Secondary | ICD-10-CM | POA: Diagnosis not present

## 2016-08-19 DIAGNOSIS — M25561 Pain in right knee: Secondary | ICD-10-CM | POA: Diagnosis present

## 2016-08-19 DIAGNOSIS — R29898 Other symptoms and signs involving the musculoskeletal system: Secondary | ICD-10-CM

## 2016-08-19 NOTE — Therapy (Signed)
[place], [name], [address] Phone: [phone]   Fax:  [phone]  Physical Therapy Treatment  Patient Details  Name: SELSO MANNOR MRN: [id number] Date of Birth: Apr 20, 1946 Referring Provider: Dr. Fredonia Highland  Encounter Date: 08/19/2016      PT End of Session - 08/19/16 0803    Visit Number 14   Number of Visits 24   Date for PT Re-Evaluation 09/14/16   PT Start Time 0800   PT Stop Time 0848   PT Time Calculation (min) 48 min   Activity Tolerance Patient tolerated treatment well;No increased pain      Past Medical History:  Diagnosis Date  . Arthritis    "hands primarily" (05/05/2016)  . Atrial fibrillation ([place])   . Back pain    age related  . CHF (congestive heart failure) (HCC)    takes Lasix daily  . Complication of anesthesia    bronchial spasms in ~ 2015; 2016; 2017  . COPD (chronic obstructive pulmonary disease) (HCC)    inhalers as needed  . Depression    takes Zoloft daily  . Dysrhythmia    A Fib-takes Xarelto daily  . Enlarged prostate    benign  . GERD (gastroesophageal reflux disease)    takes Omeprazole daily  . Glaucoma    mild  . 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 Hyperlipidemia    takes Atorvastatin daily  . Hypertension    takes Metoprolol,HCTZ,and Losartan daily.Takes Clonidine if needed  . Nocturia   . Peripheral neuropathy (HCC)    takes Gabapentin daily  . Pneumonia ?2015  . Restless leg   . Stroke [facility]) 2012   denies residual on 05/05/2016  . Type II diabetes mellitus ([place])    borderline but doesn't take any meds (05/05/2016)    Past Surgical History:  Procedure Laterality Date  . CATARACT EXTRACTION W/ INTRAOCULAR LENS  IMPLANT, BILATERAL Bilateral   . I&D KNEE WITH POLY EXCHANGE Right 05/04/2016   IRRIGATION AND DEBRIDEMENT Right KNEE WITH UNICOMPARTMENTAL POLY EXCHANGE  . I&D KNEE WITH POLY EXCHANGE  Right 05/04/2016   Procedure: IRRIGATION AND DEBRIDEMENT Right KNEE WITH UNICOMPARTMENTAL POLY EXCHANGE;  Surgeon: Renette Butters, MD;  Location: [place];  Service: Orthopedics;  Laterality: Right;  . JOINT REPLACEMENT    . LAPAROSCOPIC CHOLECYSTECTOMY  1995  . PARTIAL KNEE ARTHROPLASTY Right 04/06/2016   Procedure: UNICOMPARTMENTAL RIGHT KNEE;  Surgeon: Renette Butters, MD;  Location: [place];  Service: Orthopedics;  Laterality: Right;  . TONSILLECTOMY AND ADENOIDECTOMY    . TOTAL KNEE ARTHROPLASTY Left 2008  . ULNAR NERVE TRANSPOSITION Right 1989    There were no vitals filed for this visit.      Subjective Assessment - 08/19/16 0803    Subjective Pt woke up this morning with pain on inside of Rt knee.  Not sure what he did to cause it.  No tightness in groin or hip anymore.     Pertinent History knee replacement Lt TKA; partial knee replacement Rt knee 04/20/16; brain stem CVA 2012; gall bladder removed   Currently in Pain? Yes   Pain Score 4    Pain Location Knee   Pain Orientation Right   Pain Descriptors / Indicators [facility] PT Assessment - 08/19/16 0001      Assessment   Medical Diagnosis Rt  partial knee replacement    Referring Provider Dr. Renaye Rakers   Onset Date/Surgical Date 04/20/16   Hand Dominance Right   Next MD Visit 07/2017     Flexibility   Quadriceps 123 deg flexion Rt knee            [facility] Adult PT Treatment/Exercise - 08/19/16 0001      Knee/Hip Exercises: Stretches   Passive Hamstring Stretch Right;Left;2 reps;30 seconds   Quad Stretch Right;2 reps;60 seconds   Piriformis Stretch Right;Left;3 reps;30 seconds   Gastroc Stretch Right;Left;3 reps;20 seconds   Other Knee/Hip Stretches Lt/Rt adductor stretch in standing x 15 sec x 3 reps     Knee/Hip Exercises: Aerobic   Nustep L5: 5 min      Knee/Hip Exercises: Standing   Heel Raises Right;Left;1 set;15 reps   Heel Raises Limitations (single leg; easier and higher heel height on  LLE)   Other Standing Knee Exercises Split squat with knee to touch 8" step x 10 reps each leg x 2 sets.    Other Standing Knee Exercises  simulated horse mounting x  10 reps each leg ( pt stepping up on Bosu and lifting opposite hip, with UE support)      Ultrasound   Ultrasound Location Rt distal hamstring    Ultrasound Parameters 100%, 1.1 w/cm2, 8 min    Ultrasound Goals Other (Comment)  tightness     Manual Therapy   Manual Therapy Soft tissue mobilization   Soft tissue mobilization muscle stripping, TPR to Rt medial/lateral hamstring.  Edge tool to distal Rt hamstring/ prox calf to decrease fascial restrictions.    Passive ROM --                  PT Short Term Goals - 07/06/16 0900      PT SHORT TERM GOAL #1   Title Initiate HEP 07/06/16   Time 2   Period Weeks   Status Achieved     PT SHORT TERM GOAL #2   Title Increase AROM Rt knee to 0 deg extension to 110 deg flexion 07/30/16   Time 6   Period Weeks   Status Partially Met     PT SHORT TERM GOAL #3   Title Patient to demonstrate diaphragmatic breathing techniques without difficulty 07/30/16   Time 6   Period Weeks   Status Achieved     PT SHORT TERM GOAL #4   Title Improve gait pattern without assistive device 07/30/16   Time 6   Period Weeks   Status Achieved           PT Long Term Goals - 07/20/16 1320      PT LONG TERM GOAL #1   Title Increase gait to 20-30 min with no pain and no limp 09/14/16   Time 12   Period Weeks   Status Partially Met  has limp when initially beginning gait     PT LONG TERM GOAL #2   Title 5/5 stength Rt LE 09/14/16   Time 12   Period Weeks   Status Achieved     PT LONG TERM GOAL #3   Title Rt knee ROM 0 deg ext to 125-130 deg flexion 09/14/16   Time 12   Period Weeks   Status On-going     PT LONG TERM GOAL #4   Title Independent in HEP 09/14/16   Time 12   Period Weeks   Status On-going     PT LONG TERM GOAL #5  Title Improve FOTO to </= 36%  limitation 09/14/16   Time 12   Period Weeks   Status On-going               Plan - 08/19/16 0831    Clinical Impression Statement Pt reported elimination of Rt knee pain during 5 min warm up on NuStep.  Bill tolerated knee strengthening exercises with increased difficulty with min symptoms.  Noted decreased tightness in Rt medial/lateral hamstring after manual therapy and [place].  Progressing well towards remaining goals.    Rehab Potential Good   PT Frequency 1x / week  per supervising PT, Jeral Pinch.    PT Duration 12 weeks   PT Treatment/Interventions Patient/family education;ADLs/Self Care Home Management;Cryotherapy;Electrical Stimulation;Iontophoresis '4mg'$ /ml Dexamethasone;Moist Heat;Ultrasound;Manual techniques;Dry needling;Therapeutic activities;Therapeutic exercise   PT Next Visit Plan Decreased frequency to 1x/wk per supervising PT.  Will continue progressive ROM/strengthening for Rt knee.    Consulted and Agree with Plan of Care Patient      Patient will benefit from skilled therapeutic intervention in order to improve the following deficits and impairments:  Improper body mechanics, Pain, Decreased range of motion, Decreased mobility, Decreased strength, Abnormal gait, Decreased activity tolerance  Visit Diagnosis: Right knee pain  Abnormality of gait  Other symptoms and signs involving the musculoskeletal system     Problem List Patient Active Problem List   Diagnosis Date Noted  . Obesity 06/01/2016  . Wound dehiscence 05/04/2016  . Knee osteoarthritis 04/06/2016    Shelbie Hutching 08/19/2016, 8:56 AM  [facility] [place], [name], [address] Phone: [phone]   Fax:  [phone]  Name: TREVONE PRESTWOOD MRN: [id number] Date of Birth: [birth year]

## 2016-08-24 ENCOUNTER — Ambulatory Visit (INDEPENDENT_AMBULATORY_CARE_PROVIDER_SITE_OTHER): Payer: Medicare Other | Admitting: Physical Therapy

## 2016-08-24 ENCOUNTER — Encounter: Payer: Medicare Other | Admitting: Physical Therapy

## 2016-08-24 DIAGNOSIS — M25561 Pain in right knee: Secondary | ICD-10-CM | POA: Diagnosis present

## 2016-08-24 DIAGNOSIS — R269 Unspecified abnormalities of gait and mobility: Secondary | ICD-10-CM | POA: Diagnosis not present

## 2016-08-24 DIAGNOSIS — R29898 Other symptoms and signs involving the musculoskeletal system: Secondary | ICD-10-CM | POA: Diagnosis not present

## 2016-08-24 NOTE — Therapy (Signed)
[facility], [name], [address] Phone: [phone]   Fax:  [phone]  Physical Therapy Treatment  Patient Details  Name: Willie Pineda MRN: [id number] Date of Birth: 1945/12/08 Referring Provider: Dr. Fredonia Highland   Encounter Date: 08/24/2016      PT End of Session - 08/24/16 0807    Visit Number 15   Number of Visits 24   Date for PT Re-Evaluation 09/14/16   PT Start Time 0802   PT Stop Time 0848   PT Time Calculation (min) 46 min      Past Medical History:  Diagnosis Date  . Arthritis    "hands primarily" (05/05/2016)  . Atrial fibrillation ([place])   . Back pain    age related  . CHF (congestive heart failure) (HCC)    takes Lasix daily  . Complication of anesthesia    bronchial spasms in ~ 2015; 2016; 2017  . COPD (chronic obstructive pulmonary disease) (HCC)    inhalers as needed  . Depression    takes Zoloft daily  . Dysrhythmia    A Fib-takes Xarelto daily  . Enlarged prostate    benign  . GERD (gastroesophageal reflux disease)    takes Omeprazole daily  . Glaucoma    mild  . 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 Hyperlipidemia    takes Atorvastatin daily  . Hypertension    takes Metoprolol,HCTZ,and Losartan daily.Takes Clonidine if needed  . Nocturia   . Peripheral neuropathy (HCC)    takes Gabapentin daily  . Pneumonia ?2015  . Restless leg   . Stroke [facility]) 2012   denies residual on 05/05/2016  . Type II diabetes mellitus ([place])    borderline but doesn't take any meds (05/05/2016)    Past Surgical History:  Procedure Laterality Date  . CATARACT EXTRACTION W/ INTRAOCULAR LENS  IMPLANT, BILATERAL Bilateral   . I&D KNEE WITH POLY EXCHANGE Right 05/04/2016   IRRIGATION AND DEBRIDEMENT Right KNEE WITH UNICOMPARTMENTAL POLY EXCHANGE  . I&D KNEE WITH POLY EXCHANGE Right 05/04/2016   Procedure: IRRIGATION AND DEBRIDEMENT Right KNEE WITH  UNICOMPARTMENTAL POLY EXCHANGE;  Surgeon: Renette Butters, MD;  Location: [place];  Service: Orthopedics;  Laterality: Right;  . JOINT REPLACEMENT    . LAPAROSCOPIC CHOLECYSTECTOMY  1995  . PARTIAL KNEE ARTHROPLASTY Right 04/06/2016   Procedure: UNICOMPARTMENTAL RIGHT KNEE;  Surgeon: Renette Butters, MD;  Location: [place];  Service: Orthopedics;  Laterality: Right;  . TONSILLECTOMY AND ADENOIDECTOMY    . TOTAL KNEE ARTHROPLASTY Left 2008  . ULNAR NERVE TRANSPOSITION Right 1989    There were no vitals filed for this visit.      Subjective Assessment - 08/24/16 0808    Subjective Pt reports his Rt hip and groin has been doing well.  He had a little bit of pain in his Rt knee cap area first thing in the morning (2/10), but it quickly resolves once he moves around.    Pertinent History knee replacement Lt TKA; partial knee replacement Rt knee 04/20/16; brain stem CVA 2012; gall bladder removed   Patient Stated Goals get stronger and improve balance    Currently in Pain? No/denies   Pain Score 0-No pain            [facility] PT Assessment - 08/24/16 0001      Assessment   Medical Diagnosis Rt partial knee replacement  Referring Provider Dr. Fredonia Highland    Onset Date/Surgical Date 04/20/16   Hand Dominance Right   Next MD Visit 07/2017     Observation/Other Assessments   Focus on Therapeutic Outcomes (FOTO)  25% limited.      AROM   Right Knee Extension -2   Right Knee Flexion 123     Flexibility   Quadriceps 125 deg flexion Rt knee          [facility] Adult PT Treatment/Exercise - 08/24/16 0001      Knee/Hip Exercises: Stretches   Passive Hamstring Stretch Right;Left;2 reps;30 seconds   Quad Stretch Right;2 reps;60 seconds   Piriformis Stretch Right;Left;3 reps;30 seconds   Gastroc Stretch Right;Left;3 reps;20 seconds   Other Knee/Hip Stretches Lt/Rt adductor stretch in standing x 15 sec x 3 reps     Knee/Hip Exercises: Aerobic   Nustep L5: 6 min      Knee/Hip Exercises:  Standing   Other Standing Knee Exercises Tandem stance with feet on blue pads x 30 sec x 2 reps each leg forward.   Split squat with knee touching 8" stool x 10 reps each leg.    Other Standing Knee Exercises  simulated horse mounting x  8 reps each leg ( pt stepping up on gray pad on 13" step and lifting opposite hip, with UE support)      Ultrasound   Ultrasound Location Rt distal hamstring    Ultrasound Parameters 100%, 1.2 w/cm2, 8 min    Ultrasound Goals --  tightness     Manual Therapy   Manual Therapy Soft tissue mobilization   Soft tissue mobilization muscle stripping, TPR to Rt medial/lateral hamstring.  Edge tool to distal Rt hamstring/ prox calf to decrease fascial restrictions.                   PT Short Term Goals - 07/06/16 0900      PT SHORT TERM GOAL #1   Title Initiate HEP 07/06/16   Time 2   Period Weeks   Status Achieved     PT SHORT TERM GOAL #2   Title Increase AROM Rt knee to 0 deg extension to 110 deg flexion 07/30/16   Time 6   Period Weeks   Status Partially Met     PT SHORT TERM GOAL #3   Title Patient to demonstrate diaphragmatic breathing techniques without difficulty 07/30/16   Time 6   Period Weeks   Status Achieved     PT SHORT TERM GOAL #4   Title Improve gait pattern without assistive device 07/30/16   Time 6   Period Weeks   Status Achieved           PT Long Term Goals - 08/24/16 1113      PT LONG TERM GOAL #1   Title Increase gait to 20-30 min with no pain and no limp 09/14/16   Time 12   Period Weeks   Status Achieved     PT LONG TERM GOAL #2   Title 5/5 stength Rt LE 09/14/16   Time 12   Period Weeks   Status Achieved     PT LONG TERM GOAL #3   Title Rt knee ROM 0 deg ext to 125-130 deg flexion 09/14/16   Time 12   Period Weeks   Status Partially Met     PT LONG TERM GOAL #4   Title Independent in HEP 09/14/16   Time 12   Period Weeks  Status Achieved     PT LONG TERM GOAL #5   Title Improve FOTO to  </= 36% limitation 09/14/16   Time 12   Period Weeks   Status Achieved               Plan - 08/24/16 1122    Clinical Impression Statement Pt tolerated all exercises well without pain or symptoms.  He demonstrated improved Rt quad flexibility.  Rush Landmark has partially met his goals but requests to d/c to HEP at this time.     Rehab Potential Good   PT Frequency 1x / week   PT Duration 12 weeks   PT Treatment/Interventions Patient/family education;ADLs/Self Care Home Management;Cryotherapy;Electrical Stimulation;Iontophoresis '4mg'$ /ml Dexamethasone;Moist Heat;Ultrasound;Manual techniques;Dry needling;Therapeutic activities;Therapeutic exercise   PT Next Visit Plan Spoke to supervising PT; will d/c at this time.    Consulted and Agree with Plan of Care Patient      Patient will benefit from skilled therapeutic intervention in order to improve the following deficits and impairments:  Improper body mechanics, Pain, Decreased range of motion, Decreased mobility, Decreased strength, Abnormal gait, Decreased activity tolerance  Visit Diagnosis: Right knee pain  Abnormality of gait  Other symptoms and signs involving the musculoskeletal system     Problem List Patient Active Problem List   Diagnosis Date Noted  . Obesity 06/01/2016  . Wound dehiscence 05/04/2016  . Knee osteoarthritis 04/06/2016   Kerin Perna, PTA 08/24/16 12:35 PM  [place] [address] [place], [name], [address] Phone: [phone]   Fax:  [phone]  Name: MALIKAH LAKEY MRN: [id number] Date of Birth: 06-Aug-1946

## 2016-08-25 DIAGNOSIS — E559 Vitamin D deficiency, unspecified: Secondary | ICD-10-CM | POA: Diagnosis not present

## 2016-08-25 DIAGNOSIS — E785 Hyperlipidemia, unspecified: Secondary | ICD-10-CM | POA: Diagnosis not present

## 2016-08-25 DIAGNOSIS — J449 Chronic obstructive pulmonary disease, unspecified: Secondary | ICD-10-CM | POA: Diagnosis not present

## 2016-08-25 DIAGNOSIS — Z0000 Encounter for general adult medical examination without abnormal findings: Secondary | ICD-10-CM | POA: Diagnosis not present

## 2016-08-25 DIAGNOSIS — Z6841 Body Mass Index (BMI) 40.0 and over, adult: Secondary | ICD-10-CM | POA: Diagnosis not present

## 2016-08-25 DIAGNOSIS — B356 Tinea cruris: Secondary | ICD-10-CM | POA: Diagnosis not present

## 2016-08-25 DIAGNOSIS — I10 Essential (primary) hypertension: Secondary | ICD-10-CM | POA: Diagnosis not present

## 2016-08-25 DIAGNOSIS — Z1389 Encounter for screening for other disorder: Secondary | ICD-10-CM | POA: Diagnosis not present

## 2016-08-25 DIAGNOSIS — F322 Major depressive disorder, single episode, severe without psychotic features: Secondary | ICD-10-CM | POA: Diagnosis not present

## 2016-08-25 DIAGNOSIS — Z135 Encounter for screening for eye and ear disorders: Secondary | ICD-10-CM | POA: Diagnosis not present

## 2016-08-25 DIAGNOSIS — E0840 Diabetes mellitus due to underlying condition with diabetic neuropathy, unspecified: Secondary | ICD-10-CM | POA: Diagnosis not present

## 2016-08-25 DIAGNOSIS — Z23 Encounter for immunization: Secondary | ICD-10-CM | POA: Diagnosis not present

## 2016-08-25 DIAGNOSIS — Z1211 Encounter for screening for malignant neoplasm of colon: Secondary | ICD-10-CM | POA: Diagnosis not present

## 2016-08-25 DIAGNOSIS — R202 Paresthesia of skin: Secondary | ICD-10-CM | POA: Diagnosis not present

## 2016-08-25 DIAGNOSIS — I509 Heart failure, unspecified: Secondary | ICD-10-CM | POA: Diagnosis not present

## 2016-08-25 DIAGNOSIS — I4891 Unspecified atrial fibrillation: Secondary | ICD-10-CM | POA: Diagnosis not present

## 2016-08-25 DIAGNOSIS — Z79899 Other long term (current) drug therapy: Secondary | ICD-10-CM | POA: Diagnosis not present

## 2016-08-25 DIAGNOSIS — E119 Type 2 diabetes mellitus without complications: Secondary | ICD-10-CM | POA: Diagnosis not present

## 2016-08-25 DIAGNOSIS — E1165 Type 2 diabetes mellitus with hyperglycemia: Secondary | ICD-10-CM | POA: Diagnosis not present

## 2016-08-26 ENCOUNTER — Encounter: Payer: Medicare Other | Admitting: Physical Therapy

## 2016-08-31 ENCOUNTER — Encounter: Payer: Medicare Other | Admitting: Physical Therapy

## 2016-09-02 ENCOUNTER — Encounter: Payer: Medicare Other | Admitting: Physical Therapy

## 2016-09-14 DIAGNOSIS — Z1212 Encounter for screening for malignant neoplasm of rectum: Secondary | ICD-10-CM | POA: Diagnosis not present

## 2016-09-14 DIAGNOSIS — Z8673 Personal history of transient ischemic attack (TIA), and cerebral infarction without residual deficits: Secondary | ICD-10-CM | POA: Diagnosis not present

## 2016-09-14 DIAGNOSIS — Z1211 Encounter for screening for malignant neoplasm of colon: Secondary | ICD-10-CM | POA: Diagnosis not present

## 2016-09-14 DIAGNOSIS — I429 Cardiomyopathy, unspecified: Secondary | ICD-10-CM | POA: Diagnosis not present

## 2016-09-14 DIAGNOSIS — I10 Essential (primary) hypertension: Secondary | ICD-10-CM | POA: Diagnosis not present

## 2016-09-14 DIAGNOSIS — I4891 Unspecified atrial fibrillation: Secondary | ICD-10-CM | POA: Diagnosis not present

## 2016-09-14 DIAGNOSIS — I482 Chronic atrial fibrillation: Secondary | ICD-10-CM | POA: Diagnosis not present

## 2016-09-28 DIAGNOSIS — I429 Cardiomyopathy, unspecified: Secondary | ICD-10-CM | POA: Diagnosis not present

## 2016-10-05 DIAGNOSIS — I482 Chronic atrial fibrillation: Secondary | ICD-10-CM | POA: Diagnosis not present

## 2016-10-05 DIAGNOSIS — I10 Essential (primary) hypertension: Secondary | ICD-10-CM | POA: Diagnosis not present

## 2016-10-05 DIAGNOSIS — I429 Cardiomyopathy, unspecified: Secondary | ICD-10-CM | POA: Diagnosis not present

## 2016-10-05 DIAGNOSIS — Z8673 Personal history of transient ischemic attack (TIA), and cerebral infarction without residual deficits: Secondary | ICD-10-CM | POA: Diagnosis not present

## 2016-10-06 DIAGNOSIS — R195 Other fecal abnormalities: Secondary | ICD-10-CM | POA: Diagnosis not present

## 2016-12-15 DIAGNOSIS — M1812 Unilateral primary osteoarthritis of first carpometacarpal joint, left hand: Secondary | ICD-10-CM | POA: Diagnosis not present

## 2016-12-15 DIAGNOSIS — M25561 Pain in right knee: Secondary | ICD-10-CM | POA: Diagnosis not present

## 2017-02-03 DIAGNOSIS — I10 Essential (primary) hypertension: Secondary | ICD-10-CM | POA: Diagnosis not present

## 2017-02-03 DIAGNOSIS — Z8673 Personal history of transient ischemic attack (TIA), and cerebral infarction without residual deficits: Secondary | ICD-10-CM | POA: Diagnosis not present

## 2017-02-03 DIAGNOSIS — I428 Other cardiomyopathies: Secondary | ICD-10-CM | POA: Diagnosis not present

## 2017-02-03 DIAGNOSIS — I482 Chronic atrial fibrillation: Secondary | ICD-10-CM | POA: Diagnosis not present

## 2017-02-21 DIAGNOSIS — H40003 Preglaucoma, unspecified, bilateral: Secondary | ICD-10-CM | POA: Diagnosis not present

## 2017-02-21 DIAGNOSIS — H527 Unspecified disorder of refraction: Secondary | ICD-10-CM | POA: Diagnosis not present

## 2017-02-21 DIAGNOSIS — H35372 Puckering of macula, left eye: Secondary | ICD-10-CM | POA: Diagnosis not present

## 2017-02-21 DIAGNOSIS — H02403 Unspecified ptosis of bilateral eyelids: Secondary | ICD-10-CM | POA: Diagnosis not present

## 2017-02-21 DIAGNOSIS — H5010 Unspecified exotropia: Secondary | ICD-10-CM | POA: Diagnosis not present

## 2017-02-23 DIAGNOSIS — E0840 Diabetes mellitus due to underlying condition with diabetic neuropathy, unspecified: Secondary | ICD-10-CM | POA: Diagnosis not present

## 2017-02-23 DIAGNOSIS — E785 Hyperlipidemia, unspecified: Secondary | ICD-10-CM | POA: Diagnosis not present

## 2017-02-23 DIAGNOSIS — Z6841 Body Mass Index (BMI) 40.0 and over, adult: Secondary | ICD-10-CM | POA: Diagnosis not present

## 2017-02-23 DIAGNOSIS — I10 Essential (primary) hypertension: Secondary | ICD-10-CM | POA: Diagnosis not present

## 2017-02-23 DIAGNOSIS — Z79899 Other long term (current) drug therapy: Secondary | ICD-10-CM | POA: Diagnosis not present

## 2017-02-23 DIAGNOSIS — E1165 Type 2 diabetes mellitus with hyperglycemia: Secondary | ICD-10-CM | POA: Diagnosis not present

## 2017-02-23 DIAGNOSIS — J449 Chronic obstructive pulmonary disease, unspecified: Secondary | ICD-10-CM | POA: Diagnosis not present

## 2017-02-23 DIAGNOSIS — R202 Paresthesia of skin: Secondary | ICD-10-CM | POA: Diagnosis not present

## 2017-02-23 DIAGNOSIS — I4891 Unspecified atrial fibrillation: Secondary | ICD-10-CM | POA: Diagnosis not present

## 2017-02-23 DIAGNOSIS — I509 Heart failure, unspecified: Secondary | ICD-10-CM | POA: Diagnosis not present

## 2017-02-23 DIAGNOSIS — E559 Vitamin D deficiency, unspecified: Secondary | ICD-10-CM | POA: Diagnosis not present

## 2017-02-23 DIAGNOSIS — B356 Tinea cruris: Secondary | ICD-10-CM | POA: Diagnosis not present

## 2017-02-24 DIAGNOSIS — Z8673 Personal history of transient ischemic attack (TIA), and cerebral infarction without residual deficits: Secondary | ICD-10-CM | POA: Diagnosis not present

## 2017-02-24 DIAGNOSIS — R55 Syncope and collapse: Secondary | ICD-10-CM | POA: Diagnosis not present

## 2017-02-24 DIAGNOSIS — I429 Cardiomyopathy, unspecified: Secondary | ICD-10-CM | POA: Diagnosis not present

## 2017-02-24 DIAGNOSIS — R0683 Snoring: Secondary | ICD-10-CM | POA: Diagnosis not present

## 2017-02-24 DIAGNOSIS — I10 Essential (primary) hypertension: Secondary | ICD-10-CM | POA: Diagnosis not present

## 2017-02-24 DIAGNOSIS — I482 Chronic atrial fibrillation: Secondary | ICD-10-CM | POA: Diagnosis not present

## 2017-03-07 ENCOUNTER — Other Ambulatory Visit: Payer: Self-pay | Admitting: Gastroenterology

## 2017-03-08 ENCOUNTER — Encounter (HOSPITAL_COMMUNITY): Payer: Self-pay | Admitting: *Deleted

## 2017-03-08 NOTE — Progress Notes (Signed)
Anesthesia Chart Review: SAME DAY WORK-UP (ENDO case).  Patient is a 71 year old male posted for colonoscopy with propofol on 03/09/17 by Dr. Penelope Coop.  History includes former smoker, GERD, HLD, HTN, afib/PAF, non-ischemic cardiomyopathy (by cardiology notes; felt to be tachycardia-induced), CHF, peripheral neuropathy, depression, glaucoma, DM2 (borderline), BPH, RLS, COPD, CVA X 3 (one event) with primary infarct to brain stem ~ 2012 (had issues with dysphagia s/p ST X 6 months; no significant residual), OSA (moderate OSA '12), cholecystectomy, eye surgery, right ulnar surgery, left TKA '08, right unicompartmental knee 8/67/67 complicated by infection s/p I&D with polyexhange 05/04/16.  I evaluated him in April 2017 prior to his right unicompartmental knee. At that time, he reported anesthesia complications of bronchospasms for 6 weeks after intubation. (There are instances even without anesthesia where he has developed a bronchospasm; worse after drinking wine in combination with laughing. 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  - PCP is listed as Dr. Josetta Huddle with [facility]. - Cardiologist is Dr. Lamar Blinks with [facility] ([place]). He was seen by Cathlean Sauer, NP on 02/25/16 for routine follow-up. He reported that about two weeks prior while sitting in a chair on his porch that he a mild coughing episode followed by syncope. He reported similar episodes in the past which typically occurred with a significant amount of coughing. These coughing spells were usually precipitated by having a hard time swallowing particular foods, which started after his CVA. She ordered a repeat echo, 30 days event monitor, and repeat sleep study. Cardiac procedures appear scheduled for later this month.   Meds include albuterol, amiodarone, Lipitor, clonidine, doxycycline, Lasix, gabapentin, HCTZ, Xalatan  ophthalmic, Claritin, losartan, Toprol-XL, Prilosec, Zofran, Roxicet, KCl, Spiriva, Zoloft, Xarelto (held 03/07/17).  EKG 02/24/17 [facility]): Atrial fibrillation @ 60 bpm, non-specific QRS widening. Poor R wave progression, non-specific, consider old anterior infarct. Diffuse non-specific T wave abnormality.   Echo 04/19/16 [facility]): Summary: This study was technically difficult. In comparison to the images from previous study dated 09/18/2015 there is probably no significant change. The left ventricle is normal in size. There is moderate concentric LVH. The left ventricular ejection fraction is moderately reduced at 40-45%. Aortic valve is not well-visualized, but is grossly normal. Mild 1+ tricuspid regurgitation. Left atrium is severely dilated. The right atrium is moderately dilated. Unable to adequately determine diastolic dysfunction.   Records scanned under Media tab, Correspondence, Encounter 04/06/16:  - EKG 02/23/16 [facility]): Afib at 85 bpm, occasional ectopic ventricular beat, right BBB and right axis, possible RVH, consider pulmonary disease pattern.  - Nuclear stress test 10/15/15 [facility]): Impression: There is no scintigraphic evidence of inducible myocardial ischemia. The post-stress EF is 39%. Global LV systolic function is moderately reduced. Non-diagnostic ECG. TID ratio is elevated. Clinical correlation recommended.  - Echo 09/18/15 [facility]): The study was technically difficult. The LV is normal in size. Moderate concentric LVH. Moderate diffuse hypokinesis in the LV. LVEF is moderately reduced at 40-45%. The LV is not well visualized. LV diastolic function is normal. LA is moderately dilated. Mild 1+ MR. AV is not well visualized, but is grossly normal. Mild 1+ TR. RVSP is elevated between 30-40 mmHg, consistent with mild pulmonary hypertension. Mild 1+ PR.  - Polysomnogram 11/12/11 [facility]): IMPRESSION:  Moderate obstructive sleep apnea. Increased periodic limb movements of sleep. RECOMMENDATIONS: Recommend the patient return to the sleep center  for a full night CPAP titration.   Spirometry 06/28/16 ([place]; Care Everywhere): Interpretation  The FEV1 and FVC are moderately reduced and suggest a restrictive lung  defect. There is a mild reduction in the diffusing capacity. Recommend a  complete PFT with lung volumes to assess for restriction if clinically  necessary.   He is for labs per GI on the day of his procedure. A1c was 8.3 on 02/23/17 at Dr. Moses Manners office. Cologuard was positive on 09/22/16, which is why he was referred to GI. H/H on 08/25/16 were 16.8/49.3.   Discussed above with anesthesiologist Dr. Linna Caprice. Patient was clinically stable at last visit with Dr. Mauricio Po on 02/03/17, but had more recent visit (02/24/17) for syncope following coughing spell, which has apparently happened in the past. (He had been referred back to cardiology by Dr. Inda Merlin, thinking that patient would likely eventually need an event monitor.) He has had an echo within the past year that showed stable EF 40-45% and grossly normal AV. There are plans to repeat echo and get a 30 day Holter, but these have not yet been done. Colonoscopy scheduled due to positive hemoccult. Anesthesiologist to evaluate prior to procedure, but if no acute changes or progressive symptoms then it is anticipated that he can proceed as planned.   George Hugh [facility] Short Stay Center/Anesthesiology Phone [phone] 03/08/2017 3:06 PM

## 2017-03-08 NOTE — Progress Notes (Signed)
Willie Pineda denies chest pain, has shortness of breath with exertion. Patient has history of Atrial Fib, and takes Xarelto, was told to hold it as of 03/07/17. Patient has a history of syncope episodes , patient reports these began after he  had stroke in 2012.  Willie Pineda believes that the syncope episodes are from Bronchospasms.  Patient was seen 02/05/2017 by his cardiologist, Dr Mauricio Po 02/03/17 for regular check up.  Patient was seen 02/24/17 by NP at Dr Shearon Stalls office.  The office visit 02/24/17 was to be seen because he had a syncope episode a couple weeks prior.  Patient "blacked out after having a coughing spell."  Patient is scheduled to wear a 30 day monitor and have a sleep study later this month. Chart is being evaluated by Myra Gianotti, PA-C.  Patient report that he is having colonoscopy because he had a postive colon screen card. Patient does not think that he is anemic, reports that he had labs drawn  A couple weeks ago at Dr Alfonso Patten. Inda Merlin office .  I called Dr Inda Merlin office and spoke with Maudie Mercury and asked for last office note and labs to be faxed to me.

## 2017-03-09 ENCOUNTER — Encounter (HOSPITAL_COMMUNITY)
Admission: RE | Disposition: A | Discharge status: Home / Self Care | Payer: Self-pay | Source: Ambulatory Visit | Attending: Gastroenterology

## 2017-03-09 ENCOUNTER — Ambulatory Visit (HOSPITAL_COMMUNITY)
Admission: RE | Admit: 2017-03-09 | Discharge: 2017-03-09 | Disposition: A | Discharge status: Home / Self Care | Payer: Medicare Other | Source: Ambulatory Visit | Attending: Gastroenterology | Admitting: Gastroenterology

## 2017-03-09 ENCOUNTER — Ambulatory Visit (HOSPITAL_COMMUNITY): Payer: Medicare Other | Admitting: Vascular Surgery

## 2017-03-09 ENCOUNTER — Encounter (HOSPITAL_COMMUNITY): Payer: Self-pay

## 2017-03-09 DIAGNOSIS — D126 Benign neoplasm of colon, unspecified: Secondary | ICD-10-CM | POA: Diagnosis not present

## 2017-03-09 DIAGNOSIS — K635 Polyp of colon: Secondary | ICD-10-CM | POA: Insufficient documentation

## 2017-03-09 DIAGNOSIS — D123 Benign neoplasm of transverse colon: Secondary | ICD-10-CM | POA: Insufficient documentation

## 2017-03-09 DIAGNOSIS — Z87891 Personal history of nicotine dependence: Secondary | ICD-10-CM | POA: Insufficient documentation

## 2017-03-09 DIAGNOSIS — K219 Gastro-esophageal reflux disease without esophagitis: Secondary | ICD-10-CM | POA: Insufficient documentation

## 2017-03-09 DIAGNOSIS — R195 Other fecal abnormalities: Secondary | ICD-10-CM | POA: Insufficient documentation

## 2017-03-09 DIAGNOSIS — Z6841 Body Mass Index (BMI) 40.0 and over, adult: Secondary | ICD-10-CM | POA: Insufficient documentation

## 2017-03-09 DIAGNOSIS — E1142 Type 2 diabetes mellitus with diabetic polyneuropathy: Secondary | ICD-10-CM | POA: Diagnosis not present

## 2017-03-09 DIAGNOSIS — Z8673 Personal history of transient ischemic attack (TIA), and cerebral infarction without residual deficits: Secondary | ICD-10-CM | POA: Insufficient documentation

## 2017-03-09 DIAGNOSIS — G4733 Obstructive sleep apnea (adult) (pediatric): Secondary | ICD-10-CM | POA: Insufficient documentation

## 2017-03-09 DIAGNOSIS — I10 Essential (primary) hypertension: Secondary | ICD-10-CM | POA: Diagnosis not present

## 2017-03-09 DIAGNOSIS — Z96652 Presence of left artificial knee joint: Secondary | ICD-10-CM | POA: Insufficient documentation

## 2017-03-09 DIAGNOSIS — I428 Other cardiomyopathies: Secondary | ICD-10-CM | POA: Insufficient documentation

## 2017-03-09 DIAGNOSIS — D122 Benign neoplasm of ascending colon: Secondary | ICD-10-CM | POA: Diagnosis not present

## 2017-03-09 DIAGNOSIS — J449 Chronic obstructive pulmonary disease, unspecified: Secondary | ICD-10-CM | POA: Insufficient documentation

## 2017-03-09 DIAGNOSIS — D124 Benign neoplasm of descending colon: Secondary | ICD-10-CM | POA: Insufficient documentation

## 2017-03-09 DIAGNOSIS — D125 Benign neoplasm of sigmoid colon: Secondary | ICD-10-CM | POA: Diagnosis not present

## 2017-03-09 HISTORY — DX: Dyspnea, unspecified: R06.00

## 2017-03-09 HISTORY — PX: COLONOSCOPY WITH PROPOFOL: SHX5780

## 2017-03-09 SURGERY — COLONOSCOPY WITH PROPOFOL
Anesthesia: Monitor Anesthesia Care

## 2017-03-09 MED ORDER — LIDOCAINE HCL (CARDIAC) 20 MG/ML IV SOLN
INTRAVENOUS | Status: DC | PRN
  Administered 2017-03-09: 20 mg via INTRATRACHEAL

## 2017-03-09 MED ORDER — PROPOFOL 500 MG/50ML IV EMUL
INTRAVENOUS | Status: DC | PRN
  Administered 2017-03-09: 100 ug/kg/min via INTRAVENOUS
  Administered 2017-03-09: 10:00:00 via INTRAVENOUS

## 2017-03-09 MED ORDER — LACTATED RINGERS IV SOLN
INTRAVENOUS | Status: DC
  Administered 2017-03-09: 1000 mL via INTRAVENOUS

## 2017-03-09 MED ORDER — SODIUM CHLORIDE 0.9 % IV SOLN
INTRAVENOUS | Status: DC | PRN
  Administered 2017-03-09: 10:00:00 via INTRAVENOUS

## 2017-03-09 NOTE — Anesthesia Preprocedure Evaluation (Addendum)
Anesthesia Evaluation  Patient identified by MRN, date of birth, ID band Patient awake    Reviewed: Allergy & Precautions, NPO status , Patient's Chart, lab work & pertinent test results, reviewed documented beta blocker date and time   History of Anesthesia Complications (+) history of anesthetic complications  Airway Mallampati: II  TM Distance: >3 FB Neck ROM: Full    Dental  (+) Teeth Intact, Dental Advisory Given   Pulmonary COPD, former smoker,    Pulmonary exam normal breath sounds clear to auscultation       Cardiovascular hypertension, Pt. on home beta blockers (-) angina+CHF  (-) Past MI Normal cardiovascular exam+ dysrhythmias Atrial Fibrillation  Rhythm:Regular Rate:Normal     Neuro/Psych PSYCHIATRIC DISORDERS Depression CVA, No Residual Symptoms    GI/Hepatic Neg liver ROS, GERD  Medicated,  Endo/Other  diabetes, Type 2Morbid obesity  Renal/GU negative Renal ROS     Musculoskeletal  (+) Arthritis , Osteoarthritis,    Abdominal   Peds  Hematology  (+) Blood dyscrasia (Xarelto), ,   Anesthesia Other Findings Day of surgery medications reviewed with the patient.  Reproductive/Obstetrics                            Anesthesia Physical Anesthesia Plan  ASA: III  Anesthesia Plan: MAC   Post-op Pain Management:    Induction: Intravenous  Airway Management Planned: Nasal Cannula  Additional Equipment:   Intra-op Plan:   Post-operative Plan:   Informed Consent: I have reviewed the patients History and Physical, chart, labs and discussed the procedure including the risks, benefits and alternatives for the proposed anesthesia with the patient or authorized representative who has indicated his/her understanding and acceptance.   Dental advisory given  Plan Discussed with: CRNA and Anesthesiologist  Anesthesia Plan Comments: (Discussed risks/benefits/alternatives to MAC  sedation including need for ventilatory support, hypotension, need for conversion to general anesthesia.  All patient questions answered.  Patient/guardian wishes to proceed.)        Anesthesia Quick Evaluation

## 2017-03-09 NOTE — Op Note (Signed)
[facility] Patient Name: Willie Pineda Procedure Date : 03/09/2017 MRN: [id number] Attending MD: Wonda Horner , MD Date of Birth: [birth year] CSN: [id number] Age: 71 Admit Type: Outpatient Procedure:                Colonoscopy Indications:              Positive Cologuard test Providers:                Wonda Horner, MD, Elna Breslow, RN, William Dalton, Technician Referring MD:              Medicines:                Sedation Administered by an Anesthesia Professional Complications:            No immediate complications. Estimated Blood Loss:     Estimated blood loss: none. Procedure:                Pre-Anesthesia Assessment:                           - Prior to the procedure, a History and Physical                            was performed, and patient medications and                            allergies were reviewed. The patient's tolerance of                            previous anesthesia was also reviewed. The risks                            and benefits of the procedure and the sedation                            options and risks were discussed with the patient.                            All questions were answered, and informed consent                            was obtained. Prior Anticoagulants: The patient has                            taken Xarelto (rivaroxaban), last dose was 2 days                            prior to procedure. ASA Grade Assessment: III - A                            patient with severe systemic disease. After  reviewing the risks and benefits, the patient was                            deemed in satisfactory condition to undergo the                            procedure.                           After obtaining informed consent, the colonoscope                            was passed under direct vision. Throughout the                            procedure, the patient's blood  pressure, pulse, and                            oxygen saturations were monitored continuously. The                            EC-[id number] ([id number]) scope was introduced through                            the anus and advanced to the the cecum, identified                            by appendiceal orifice and ileocecal valve. The                            ileocecal valve and the rectum were photographed.                            The colonoscopy was performed without difficulty.                            The patient tolerated the procedure well. The                            quality of the bowel preparation was adequate. Findings:      The perianal and digital rectal examinations were normal.      Multiple sessile polyps were found in the sigmoid colon, descending       colon, transverse colon and ascending colon. The polyps were 5 to 10 mm       in size. These polyps were removed with a hot snare. A total of 14 were       removed Resection was complete, but only 12 of the 14 were retrieved. Impression:               - Multiple 5 to 10 mm polyps in the sigmoid colon,                            in the descending colon, in the transverse colon  and in the ascending colon, removed with a hot                            snare. Complete resection. Partial retrieval. Moderate Sedation:      . Recommendation:           - Resume regular diet.                           - Continue present medications.                           - Resume Xarelto (rivaroxaban) at prior dose in 5                            days.                           - Repeat colonoscopy is recommended. The                            colonoscopy date will be determined after pathology                            results from today's exam become available for                            review. Procedure Code(s):        --- Professional ---                           [phone], Colonoscopy, flexible; with  removal of                            tumor(s), polyp(s), or other lesion(s) by snare                            technique Diagnosis Code(s):        --- Professional ---                           D12.5, Benign neoplasm of sigmoid colon                           D12.4, Benign neoplasm of descending colon                           D12.3, Benign neoplasm of transverse colon (hepatic                            flexure or splenic flexure)                           D12.2, Benign neoplasm of ascending colon                           R19.5, Other fecal abnormalities CPT copyright 2016 American Medical Association.  All rights reserved. The codes documented in this report are preliminary and upon coder review may  be revised to meet current compliance requirements. Wonda Horner, MD 03/09/2017 10:48:58 AM This report has been signed electronically. Number of Addenda: 0

## 2017-03-09 NOTE — Transfer of Care (Signed)
Immediate Anesthesia Transfer of Care Note  Patient: Willie Pineda  Procedure(s) Performed: Procedure(s): COLONOSCOPY WITH PROPOFOL (N/A)  Patient Location: Endoscopy Unit  Anesthesia Type:MAC  Level of Consciousness: awake, alert , oriented, patient cooperative and responds to stimulation  Airway & Oxygen Therapy: Patient Spontanous Breathing  Post-op Assessment: Report given to RN, Post -op Vital signs reviewed and stable and Patient moving all extremities X 4  Post vital signs: Reviewed and stable  Last Vitals:  Vitals:   03/09/17 0824  BP: (!) 158/107  Pulse: (!) 111  Resp: (!) 21  Temp: 37.1 C    Last Pain:  Vitals:   03/09/17 0824  TempSrc: Oral         Complications: No apparent anesthesia complications

## 2017-03-09 NOTE — Discharge Instructions (Signed)

## 2017-03-09 NOTE — H&P (Signed)
The patient presents to the outpatient department endoscopy unit for a colonoscopy today because of findings of a positive Cologaurd.  Prior history reviewed and no change  Physical  Alert and oriented  No distress  Heart regular rhythm no murmurs heard  Lungs clear  Abdomen: Soft and nontender  Impression: Positive [place] test.  Plan: Colonoscopy

## 2017-03-09 NOTE — Anesthesia Procedure Notes (Signed)
Procedure Name: MAC Date/Time: 03/09/2017 9:39 AM Performed by: Tressia Miners LEFFEW Pre-anesthesia Checklist: Patient identified, Emergency Drugs available, Suction available, Timeout performed and Patient being monitored Patient Re-evaluated:Patient Re-evaluated prior to inductionOxygen Delivery Method: Simple face mask Placement Confirmation: positive ETCO2

## 2017-03-09 NOTE — Anesthesia Postprocedure Evaluation (Signed)
Anesthesia Post Note  Patient: Willie Pineda  Procedure(s) Performed: Procedure(s) (LRB): COLONOSCOPY WITH PROPOFOL (N/A)  Patient location during evaluation: PACU Anesthesia Type: MAC Level of consciousness: awake and alert Pain management: pain level controlled Vital Signs Assessment: post-procedure vital signs reviewed and stable Respiratory status: spontaneous breathing, nonlabored ventilation, respiratory function stable and patient connected to nasal cannula oxygen Cardiovascular status: stable and blood pressure returned to baseline Anesthetic complications: no       Last Vitals:  Vitals:   03/09/17 1100 03/09/17 1110  BP: 107/77 118/88  Pulse:  87  Resp:    Temp:      Last Pain:  Vitals:   03/09/17 1045  TempSrc: Oral                 Catalina Gravel

## 2017-03-10 ENCOUNTER — Encounter (HOSPITAL_COMMUNITY): Payer: Self-pay | Admitting: Gastroenterology

## 2017-03-21 DIAGNOSIS — I517 Cardiomegaly: Secondary | ICD-10-CM | POA: Diagnosis not present

## 2017-03-21 DIAGNOSIS — I071 Rheumatic tricuspid insufficiency: Secondary | ICD-10-CM | POA: Diagnosis not present

## 2017-03-21 DIAGNOSIS — R55 Syncope and collapse: Secondary | ICD-10-CM | POA: Diagnosis not present

## 2017-03-21 DIAGNOSIS — I5189 Other ill-defined heart diseases: Secondary | ICD-10-CM | POA: Diagnosis not present

## 2017-03-30 DIAGNOSIS — J302 Other seasonal allergic rhinitis: Secondary | ICD-10-CM | POA: Diagnosis not present

## 2017-03-30 DIAGNOSIS — R0602 Shortness of breath: Secondary | ICD-10-CM | POA: Diagnosis not present

## 2017-03-30 DIAGNOSIS — R05 Cough: Secondary | ICD-10-CM | POA: Diagnosis not present

## 2017-04-06 DIAGNOSIS — G4719 Other hypersomnia: Secondary | ICD-10-CM | POA: Diagnosis not present

## 2017-04-06 DIAGNOSIS — R0683 Snoring: Secondary | ICD-10-CM | POA: Diagnosis not present

## 2017-04-06 DIAGNOSIS — I10 Essential (primary) hypertension: Secondary | ICD-10-CM | POA: Diagnosis not present

## 2017-04-12 DIAGNOSIS — R0602 Shortness of breath: Secondary | ICD-10-CM | POA: Diagnosis not present

## 2017-04-12 DIAGNOSIS — R05 Cough: Secondary | ICD-10-CM | POA: Diagnosis not present

## 2017-04-20 DIAGNOSIS — G4733 Obstructive sleep apnea (adult) (pediatric): Secondary | ICD-10-CM | POA: Diagnosis not present

## 2017-04-27 DIAGNOSIS — G4733 Obstructive sleep apnea (adult) (pediatric): Secondary | ICD-10-CM | POA: Diagnosis not present

## 2017-05-04 DIAGNOSIS — I10 Essential (primary) hypertension: Secondary | ICD-10-CM | POA: Diagnosis not present

## 2017-05-04 DIAGNOSIS — Z8673 Personal history of transient ischemic attack (TIA), and cerebral infarction without residual deficits: Secondary | ICD-10-CM | POA: Diagnosis not present

## 2017-05-04 DIAGNOSIS — I429 Cardiomyopathy, unspecified: Secondary | ICD-10-CM | POA: Diagnosis not present

## 2017-05-04 DIAGNOSIS — I482 Chronic atrial fibrillation: Secondary | ICD-10-CM | POA: Diagnosis not present

## 2017-05-04 DIAGNOSIS — G4733 Obstructive sleep apnea (adult) (pediatric): Secondary | ICD-10-CM | POA: Diagnosis not present

## 2017-05-04 DIAGNOSIS — H5034 Intermittent alternating exotropia: Secondary | ICD-10-CM | POA: Diagnosis not present

## 2017-05-24 DIAGNOSIS — R202 Paresthesia of skin: Secondary | ICD-10-CM | POA: Diagnosis not present

## 2017-05-24 DIAGNOSIS — I10 Essential (primary) hypertension: Secondary | ICD-10-CM | POA: Diagnosis not present

## 2017-05-24 DIAGNOSIS — I4891 Unspecified atrial fibrillation: Secondary | ICD-10-CM | POA: Diagnosis not present

## 2017-05-24 DIAGNOSIS — E1165 Type 2 diabetes mellitus with hyperglycemia: Secondary | ICD-10-CM | POA: Diagnosis not present

## 2017-05-24 DIAGNOSIS — E559 Vitamin D deficiency, unspecified: Secondary | ICD-10-CM | POA: Diagnosis not present

## 2017-05-24 DIAGNOSIS — I951 Orthostatic hypotension: Secondary | ICD-10-CM | POA: Diagnosis not present

## 2017-05-24 DIAGNOSIS — M791 Myalgia: Secondary | ICD-10-CM | POA: Diagnosis not present

## 2017-05-24 DIAGNOSIS — R0609 Other forms of dyspnea: Secondary | ICD-10-CM | POA: Diagnosis not present

## 2017-05-24 DIAGNOSIS — G4733 Obstructive sleep apnea (adult) (pediatric): Secondary | ICD-10-CM | POA: Diagnosis not present

## 2017-05-24 DIAGNOSIS — E0840 Diabetes mellitus due to underlying condition with diabetic neuropathy, unspecified: Secondary | ICD-10-CM | POA: Diagnosis not present

## 2017-05-24 DIAGNOSIS — R1310 Dysphagia, unspecified: Secondary | ICD-10-CM | POA: Diagnosis not present

## 2017-05-24 DIAGNOSIS — R55 Syncope and collapse: Secondary | ICD-10-CM | POA: Diagnosis not present

## 2017-06-01 DIAGNOSIS — H5034 Intermittent alternating exotropia: Secondary | ICD-10-CM | POA: Diagnosis not present

## 2017-06-03 ENCOUNTER — Ambulatory Visit: Payer: Self-pay | Admitting: Ophthalmology

## 2017-06-03 ENCOUNTER — Encounter (HOSPITAL_BASED_OUTPATIENT_CLINIC_OR_DEPARTMENT_OTHER): Payer: Self-pay | Admitting: *Deleted

## 2017-06-03 NOTE — Progress Notes (Signed)
Pre op phone call done and chart reviewed by Dr. Smith Robert. Pt to come in on Monday or Tuesday for anesthesia consult and airway evaluation.  Scheduled for surgery Friday 7/6 with Dr. Annamaria Boots.    H/O chronic afib, sleep apnea, cva without deficits, and 'bronchospasm with activity' per pt. Pt states that a recent colonoscopy was postponed when it had to be moved to main hospital by  Anesthesia at [facility].   Cardiac clearance on chart from Dr. Mauricio Po. Pt may continue his Xarelto per Dr Annamaria Boots and cardiology.

## 2017-06-06 NOTE — Anesthesia Preprocedure Evaluation (Addendum)
Anesthesia Evaluation  Patient identified by MRN, date of birth, ID band Patient awake    Reviewed: Allergy & Precautions, NPO status , Patient's Chart, lab work & pertinent test results, reviewed documented beta blocker date and time   History of Anesthesia Complications (+) history of anesthetic complications  Airway Mallampati: I  TM Distance: >3 FB Neck ROM: Full    Dental no notable dental hx. (+) Teeth Intact   Pulmonary shortness of breath and with exertion, sleep apnea and Continuous Positive Airway Pressure Ventilation , pneumonia, resolved, COPD,  COPD inhaler, former smoker,    Pulmonary exam normal breath sounds clear to auscultation       Cardiovascular hypertension, Pt. on medications and Pt. on home beta blockers +CHF  Normal cardiovascular exam+ dysrhythmias Atrial Fibrillation  Rhythm:Irregular Rate:Normal     Neuro/Psych PSYCHIATRIC DISORDERS Depression Mild difficulty with swallowing  Neuromuscular disease CVA, Residual Symptoms    GI/Hepatic Neg liver ROS, GERD  Medicated and Controlled,  Endo/Other  diabetes, Type 2Hyperlipidemia  Renal/GU negative Renal ROS  negative genitourinary   Musculoskeletal  (+) Arthritis , Osteoarthritis,  Bilateral exotropia   Abdominal (+) + obese,   Peds  Hematology negative hematology ROS (+) On Xarelto- was told to continue medication through day of surgery   Anesthesia Other Findings   Reproductive/Obstetrics negative OB ROS                          Anesthesia Physical Anesthesia Plan  ASA: III  Anesthesia Plan: General   Post-op Pain Management:    Induction: Intravenous  PONV Risk Score and Plan: 3 and Ondansetron, Dexamethasone, Propofol and Midazolam  Airway Management Planned: LMA  Additional Equipment:   Intra-op Plan:   Post-operative Plan: Extubation in OR  Informed Consent:   Dental advisory given  Plan  Discussed with: CRNA  Anesthesia Plan Comments:       Anesthesia Quick Evaluation

## 2017-06-06 NOTE — Progress Notes (Signed)
Anesthesia consult per Dr. Foster, will proceed with surgery as scheduled.  

## 2017-06-09 ENCOUNTER — Ambulatory Visit: Payer: Self-pay | Admitting: Ophthalmology

## 2017-06-09 NOTE — H&P (Signed)
Date of examination:  06-01-17  Indication for surgery: to straighten the eyes and allow some binocularity  Pertinent past medical history:  Past Medical History:  Diagnosis Date  . Arthritis    "hands primarily" (05/05/2016)  . Atrial fibrillation ([place])   . Back pain    age related  . CHF (congestive heart failure) ([place])   . Complication of anesthesia    Bronchial spasms  . COPD (chronic obstructive pulmonary disease) ([facility])    inhalers as needed  . Depression    takes Zoloft daily  . Dyspnea    with exertion  . Dysrhythmia    A Fib-takes Xarelto daily  . Enlarged prostate    benign  . GERD (gastroesophageal reflux disease)    takes Omeprazole daily  . 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 Hyperlipidemia    takes Atorvastatin daily  . Hypertension    takes Metoprolol,HCTZ,and Losartan daily.Takes Clonidine if needed  . Nocturia   . Peripheral neuropathy    takes Gabapentin daily  . Pneumonia ?2015  . Restless leg   . Sleep apnea   . Stroke [facility]) 2012   denies residual on 05/05/2016  . Type II diabetes mellitus ([facility])    borderline but doesn't take any meds (05/05/2016)    Pertinent ocular history:  LXT most of his life by history, strabismus surgery '96, details unavailable "didn't help."  Tried prism.  Covers OD to read  Pertinent family history: No family history on file.  General:  Healthy appearing patient in no distress.    Eyes:    Acuity  cc OD 20/50  OS 20/40  External: Within normal limits     Anterior segment:  PCIOL OU  Motility:   XT 35 comitant.  LHT 6 greater in right gaze.  XT'45.  2- elevation and depression of both eyes 1- adduction and abduction both eyes  Fundus: deferred  Refraction:  OD plano SE approx  OS -2 SE appeox  Heart: Regular rate and rhythm without murmur     Lungs: Clear to auscultation      Impression:1)  Exotropia residual s/p previous strabismus surgery, details unavailable  2) Hx  brainstem CVA '12  3) Afib cleared by cardiology  4) Sleep apnea  Plan: Explore medial and lateral rectus muscle both eyes.  Recess or re-recess one or both lateral rectus muscles, and resect or advance one or both medial rectus muscles  Jelitza Manninen O

## 2017-06-10 ENCOUNTER — Ambulatory Visit (HOSPITAL_BASED_OUTPATIENT_CLINIC_OR_DEPARTMENT_OTHER): Payer: Medicare Other | Admitting: Anesthesiology

## 2017-06-10 ENCOUNTER — Encounter (HOSPITAL_BASED_OUTPATIENT_CLINIC_OR_DEPARTMENT_OTHER): Payer: Self-pay | Admitting: *Deleted

## 2017-06-10 ENCOUNTER — Ambulatory Visit (HOSPITAL_BASED_OUTPATIENT_CLINIC_OR_DEPARTMENT_OTHER)
Admission: RE | Admit: 2017-06-10 | Discharge: 2017-06-10 | Disposition: A | Discharge status: Home / Self Care | Payer: Medicare Other | Source: Ambulatory Visit | Attending: Ophthalmology | Admitting: Ophthalmology

## 2017-06-10 ENCOUNTER — Encounter (HOSPITAL_BASED_OUTPATIENT_CLINIC_OR_DEPARTMENT_OTHER)
Admission: RE | Disposition: A | Discharge status: Home / Self Care | Payer: Self-pay | Source: Ambulatory Visit | Attending: Ophthalmology

## 2017-06-10 DIAGNOSIS — J449 Chronic obstructive pulmonary disease, unspecified: Secondary | ICD-10-CM | POA: Insufficient documentation

## 2017-06-10 DIAGNOSIS — Z8673 Personal history of transient ischemic attack (TIA), and cerebral infarction without residual deficits: Secondary | ICD-10-CM | POA: Diagnosis not present

## 2017-06-10 DIAGNOSIS — Z7902 Long term (current) use of antithrombotics/antiplatelets: Secondary | ICD-10-CM | POA: Insufficient documentation

## 2017-06-10 DIAGNOSIS — I110 Hypertensive heart disease with heart failure: Secondary | ICD-10-CM | POA: Insufficient documentation

## 2017-06-10 DIAGNOSIS — K219 Gastro-esophageal reflux disease without esophagitis: Secondary | ICD-10-CM | POA: Insufficient documentation

## 2017-06-10 DIAGNOSIS — N400 Enlarged prostate without lower urinary tract symptoms: Secondary | ICD-10-CM | POA: Diagnosis not present

## 2017-06-10 DIAGNOSIS — Z86718 Personal history of other venous thrombosis and embolism: Secondary | ICD-10-CM | POA: Diagnosis not present

## 2017-06-10 DIAGNOSIS — M1990 Unspecified osteoarthritis, unspecified site: Secondary | ICD-10-CM | POA: Insufficient documentation

## 2017-06-10 DIAGNOSIS — F329 Major depressive disorder, single episode, unspecified: Secondary | ICD-10-CM | POA: Insufficient documentation

## 2017-06-10 DIAGNOSIS — I4891 Unspecified atrial fibrillation: Secondary | ICD-10-CM | POA: Insufficient documentation

## 2017-06-10 DIAGNOSIS — H5010 Unspecified exotropia: Secondary | ICD-10-CM | POA: Diagnosis not present

## 2017-06-10 DIAGNOSIS — Z79899 Other long term (current) drug therapy: Secondary | ICD-10-CM | POA: Insufficient documentation

## 2017-06-10 DIAGNOSIS — I509 Heart failure, unspecified: Secondary | ICD-10-CM | POA: Insufficient documentation

## 2017-06-10 DIAGNOSIS — E1142 Type 2 diabetes mellitus with diabetic polyneuropathy: Secondary | ICD-10-CM | POA: Insufficient documentation

## 2017-06-10 DIAGNOSIS — H5000 Unspecified esotropia: Secondary | ICD-10-CM | POA: Diagnosis not present

## 2017-06-10 DIAGNOSIS — M179 Osteoarthritis of knee, unspecified: Secondary | ICD-10-CM | POA: Diagnosis not present

## 2017-06-10 DIAGNOSIS — E785 Hyperlipidemia, unspecified: Secondary | ICD-10-CM | POA: Diagnosis not present

## 2017-06-10 DIAGNOSIS — H5015 Alternating exotropia: Secondary | ICD-10-CM | POA: Diagnosis not present

## 2017-06-10 HISTORY — PX: STRABISMUS SURGERY: SHX218

## 2017-06-10 HISTORY — DX: Sleep apnea, unspecified: G47.30

## 2017-06-10 LAB — GLUCOSE, CAPILLARY: Glucose-Capillary: 186 mg/dL — ABNORMAL HIGH (ref 65–99)

## 2017-06-10 LAB — POCT I-STAT, CHEM 8
BUN: 23 mg/dL — ABNORMAL HIGH (ref 6–20)
Calcium, Ion: 1.06 mmol/L — ABNORMAL LOW (ref 1.15–1.40)
Chloride: 100 mmol/L — ABNORMAL LOW (ref 101–111)
Creatinine, Ser: 0.8 mg/dL (ref 0.61–1.24)
Glucose, Bld: 144 mg/dL — ABNORMAL HIGH (ref 65–99)
HCT: 45 % (ref 39.0–52.0)
Hemoglobin: 15.3 g/dL (ref 13.0–17.0)
Potassium: 3.8 mmol/L (ref 3.5–5.1)
Sodium: 140 mmol/L (ref 135–145)
TCO2: 30 mmol/L (ref 0–100)

## 2017-06-10 SURGERY — STRABISMUS SURGERY, BILATERAL
Anesthesia: General | Site: Eye | Laterality: Bilateral

## 2017-06-10 MED ORDER — TOBRAMYCIN-DEXAMETHASONE 0.3-0.1 % OP OINT
TOPICAL_OINTMENT | OPHTHALMIC | Status: DC | PRN
  Administered 2017-06-10: 1 via OPHTHALMIC

## 2017-06-10 MED ORDER — OXYCODONE HCL 5 MG PO TABS: 5.0000 mg | ORAL_TABLET | Freq: Once | ORAL | Status: DC | PRN

## 2017-06-10 MED ORDER — HYDRALAZINE HCL 20 MG/ML IJ SOLN
INTRAMUSCULAR | Status: DC | PRN
  Administered 2017-06-10: 20 mg via INTRAVENOUS

## 2017-06-10 MED ORDER — PHENYLEPHRINE 40 MCG/ML (10ML) SYRINGE FOR IV PUSH (FOR BLOOD PRESSURE SUPPORT)
PREFILLED_SYRINGE | INTRAVENOUS | Status: AC
  Filled 2017-06-10: qty 10

## 2017-06-10 MED ORDER — PHENYLEPHRINE HCL 10 MG/ML IJ SOLN
INTRAMUSCULAR | Status: DC | PRN
  Administered 2017-06-10: 4 ug via INTRAVENOUS
  Administered 2017-06-10: 40 ug via INTRAVENOUS

## 2017-06-10 MED ORDER — PROMETHAZINE HCL 25 MG/ML IJ SOLN: 6.2500 mg | INTRAMUSCULAR | Status: DC | PRN

## 2017-06-10 MED ORDER — OXYCODONE HCL 5 MG PO TABS
ORAL_TABLET | ORAL | Status: AC
  Filled 2017-06-10: qty 1

## 2017-06-10 MED ORDER — FENTANYL CITRATE (PF) 100 MCG/2ML IJ SOLN
50.0000 ug | INTRAMUSCULAR | Status: AC | PRN
  Administered 2017-06-10: 25 ug via INTRAVENOUS
  Administered 2017-06-10: 100 ug via INTRAVENOUS
  Administered 2017-06-10: 25 ug via INTRAVENOUS

## 2017-06-10 MED ORDER — OXYCODONE HCL 5 MG/5ML PO SOLN: 5.0000 mg | Freq: Once | ORAL | Status: DC | PRN

## 2017-06-10 MED ORDER — LIDOCAINE HCL (CARDIAC) 20 MG/ML IV SOLN
INTRAVENOUS | Status: AC
  Filled 2017-06-10: qty 5

## 2017-06-10 MED ORDER — MEPERIDINE HCL 25 MG/ML IJ SOLN: 6.2500 mg | INTRAMUSCULAR | Status: DC | PRN

## 2017-06-10 MED ORDER — FENTANYL CITRATE (PF) 100 MCG/2ML IJ SOLN
INTRAMUSCULAR | Status: AC
  Filled 2017-06-10: qty 2

## 2017-06-10 MED ORDER — TOBRAMYCIN-DEXAMETHASONE 0.3-0.1 % OP OINT: 1.0000 "application " | TOPICAL_OINTMENT | Freq: Two times a day (BID) | OPHTHALMIC | 0 refills | Status: DC

## 2017-06-10 MED ORDER — HYDROMORPHONE HCL 1 MG/ML IJ SOLN: 0.2500 mg | INTRAMUSCULAR | Status: DC | PRN

## 2017-06-10 MED ORDER — LACTATED RINGERS IV SOLN
INTRAVENOUS | Status: DC
  Administered 2017-06-10 (×2): via INTRAVENOUS

## 2017-06-10 MED ORDER — DEXAMETHASONE SODIUM PHOSPHATE 10 MG/ML IJ SOLN
INTRAMUSCULAR | Status: AC
  Filled 2017-06-10: qty 1

## 2017-06-10 MED ORDER — SCOPOLAMINE 1 MG/3DAYS TD PT72: 1.0000 | MEDICATED_PATCH | Freq: Once | TRANSDERMAL | Status: DC | PRN

## 2017-06-10 MED ORDER — PROPOFOL 10 MG/ML IV BOLUS
INTRAVENOUS | Status: AC
Start: 2017-06-10 — End: 2017-06-10
  Filled 2017-06-10: qty 20

## 2017-06-10 MED ORDER — PROPOFOL 10 MG/ML IV BOLUS
INTRAVENOUS | Status: DC | PRN
  Administered 2017-06-10: 200 mg via INTRAVENOUS

## 2017-06-10 MED ORDER — ONDANSETRON HCL 4 MG/2ML IJ SOLN
INTRAMUSCULAR | Status: AC
Start: 2017-06-10 — End: 2017-06-10
  Filled 2017-06-10: qty 2

## 2017-06-10 MED ORDER — DEXAMETHASONE SODIUM PHOSPHATE 4 MG/ML IJ SOLN
INTRAMUSCULAR | Status: DC | PRN
  Administered 2017-06-10: 10 mg via INTRAVENOUS

## 2017-06-10 MED ORDER — LIDOCAINE 2% (20 MG/ML) 5 ML SYRINGE
INTRAMUSCULAR | Status: DC | PRN
  Administered 2017-06-10: 80 mg via INTRAVENOUS

## 2017-06-10 MED ORDER — GLYCOPYRROLATE 0.2 MG/ML IJ SOLN
INTRAMUSCULAR | Status: DC | PRN
  Administered 2017-06-10: .4 mg via INTRAVENOUS

## 2017-06-10 MED ORDER — MIDAZOLAM HCL 2 MG/2ML IJ SOLN: 1.0000 mg | INTRAMUSCULAR | Status: DC | PRN

## 2017-06-10 SURGICAL SUPPLY — 30 items
APPLICATOR COTTON TIP 6IN STRL (MISCELLANEOUS) ×8 IMPLANT
APPLICATOR DR MATTHEWS STRL (MISCELLANEOUS) ×2 IMPLANT
BANDAGE EYE OVAL (MISCELLANEOUS) IMPLANT
CAUTERY EYE LOW TEMP 1300F FIN (OPHTHALMIC RELATED) IMPLANT
COVER BACK TABLE 60X90IN (DRAPES) ×2 IMPLANT
COVER MAYO STAND STRL (DRAPES) ×2 IMPLANT
DRAPE SURG 17X23 STRL (DRAPES) ×4 IMPLANT
DRAPE U-SHAPE 76X120 STRL (DRAPES) IMPLANT
GLOVE BIO SURGEON STRL SZ 6.5 (GLOVE) ×10 IMPLANT
GLOVE BIO SURGEON STRL SZ7 (GLOVE) ×2 IMPLANT
GLOVE BIOGEL M STRL SZ7.5 (GLOVE) ×2 IMPLANT
GOWN STRL REUS W/ TWL LRG LVL3 (GOWN DISPOSABLE) ×3 IMPLANT
GOWN STRL REUS W/TWL LRG LVL3 (GOWN DISPOSABLE) ×3
GOWN STRL REUS W/TWL XL LVL3 (GOWN DISPOSABLE) ×4 IMPLANT
NS IRRIG 1000ML POUR BTL (IV SOLUTION) ×2 IMPLANT
PACK BASIN DAY SURGERY FS (CUSTOM PROCEDURE TRAY) ×2 IMPLANT
SHEET MEDIUM DRAPE 40X70 STRL (DRAPES) IMPLANT
SPEAR EYE SURG WECK-CEL (MISCELLANEOUS) ×6 IMPLANT
STRIP CLOSURE SKIN 1/4X4 (GAUZE/BANDAGES/DRESSINGS) IMPLANT
SUT 6 0 SILK T G140 8DA (SUTURE) ×2 IMPLANT
SUT MERSILENE 6-0 18IN S14 8MM (SUTURE)
SUT PLAIN 6 0 TG1408 (SUTURE) ×2 IMPLANT
SUT SILK 4 0 C 3 735G (SUTURE) IMPLANT
SUT VICRYL 6 0 S 28 (SUTURE) ×2 IMPLANT
SUT VICRYL ABS 6-0 S29 18IN (SUTURE) ×2 IMPLANT
SUTURE MERSLN 6-0 18IN S14 8MM (SUTURE) IMPLANT
SYR 10ML LL (SYRINGE) ×2 IMPLANT
SYR TB 1ML LL NO SAFETY (SYRINGE) ×2 IMPLANT
TOWEL OR 17X24 6PK STRL BLUE (TOWEL DISPOSABLE) ×2 IMPLANT
TRAY DSU PREP LF (CUSTOM PROCEDURE TRAY) ×2 IMPLANT

## 2017-06-10 NOTE — Interval H&P Note (Signed)
History and Physical Interval Note:  06/10/2017 11:26 AM  Willie Pineda  has presented today for surgery, with the diagnosis of EXOTROPIA  The various methods of treatment have been discussed with the patient and family. After consideration of risks, benefits and other options for treatment, the patient has consented to  Procedure(s): REPAIR STRABISMUS BILATERAL (Bilateral) as a surgical intervention .  The patient's history has been reviewed, patient examined, no change in status, stable for surgery.  I have reviewed the patient's chart and labs.  Questions were answered to the patient's satisfaction.     Derry Skill

## 2017-06-10 NOTE — Discharge Instructions (Signed)
Diet: Clear liquids, advance to soft foods then regular diet as tolerated by this evening.  Pain control:   1)  Ibuprofen 600 mg by mouth every 6-8 hours as needed for pain  2)  Ice pack/cold compress to operated eye(s) as desired  Eye medications:  Tobradex or Zylet eye ointment 1/2 inch in operated eye(s) twice a day   Activity: No swimming for 1 week.  It is OK to let water run over the face and eyes while showering or taking a bath, even during the first week.  No other restriction on exercise or activity.    Call Dr. Janee Morn office [phone] with any problems or concerns.    Post Anesthesia Home Care Instructions  Activity: Get plenty of rest for the remainder of the day. A responsible individual must stay with you for 24 hours following the procedure.  For the next 24 hours, DO NOT: -Drive a car -Paediatric nurse -Drink alcoholic beverages -Take any medication unless instructed by your physician -Make any legal decisions or sign important papers.  Meals: Start with liquid foods such as gelatin or soup. Progress to regular foods as tolerated. Avoid greasy, spicy, heavy foods. If nausea and/or vomiting occur, drink only clear liquids until the nausea and/or vomiting subsides. Call your physician if vomiting continues.  Special Instructions/Symptoms: Your throat may feel dry or sore from the anesthesia or the breathing tube placed in your throat during surgery. If this causes discomfort, gargle with warm salt water. The discomfort should disappear within 24 hours.  If you had a scopolamine patch placed behind your ear for the management of post- operative nausea and/or vomiting:  1. The medication in the patch is effective for 72 hours, after which it should be removed.  Wrap patch in a tissue and discard in the trash. Wash hands thoroughly with soap and water. 2. You may remove the patch earlier than 72 hours if you experience unpleasant side effects which may include dry  mouth, dizziness or visual disturbances. 3. Avoid touching the patch. Wash your hands with soap and water after contact with the patch.   Call your surgeon if you experience:   1.  Fever over 101.0. 2.  Inability to urinate. 3.  Nausea and/or vomiting. 4.  Extreme swelling or bruising at the surgical site. 5.  Continued bleeding from the incision. 6.  Increased pain, redness or drainage from the incision. 7.  Problems related to your pain medication. 8.  Any problems and/or concerns

## 2017-06-10 NOTE — Anesthesia Procedure Notes (Signed)
Procedure Name: LMA Insertion Performed by: Avaleigh Decuir W Pre-anesthesia Checklist: Patient identified, Emergency Drugs available, Suction available and Patient being monitored Patient Re-evaluated:Patient Re-evaluated prior to inductionOxygen Delivery Method: Circle system utilized Preoxygenation: Pre-oxygenation with 100% oxygen Intubation Type: IV induction Ventilation: Mask ventilation without difficulty LMA: LMA flexible inserted LMA Size: 4.0 Number of attempts: 1 Placement Confirmation: positive ETCO2 Tube secured with: Tape Dental Injury: Teeth and Oropharynx as per pre-operative assessment        

## 2017-06-10 NOTE — Anesthesia Postprocedure Evaluation (Signed)
Anesthesia Post Note  Patient: Willie Pineda  Procedure(s) Performed: Procedure(s) (LRB): REPAIR STRABISMUS BILATERAL (Bilateral)     Patient location during evaluation: PACU Anesthesia Type: General Level of consciousness: sedated and patient cooperative Pain management: pain level controlled Vital Signs Assessment: post-procedure vital signs reviewed and stable Respiratory status: spontaneous breathing Cardiovascular status: stable Anesthetic complications: no    Last Vitals:  Vitals:   06/10/17 1500 06/10/17 1529  BP: (!) 105/59 (!) 99/48  Pulse: 89   Resp: 19 16  Temp:  36.6 C    Last Pain:  Vitals:   06/10/17 1529  TempSrc:   PainSc: 0-No pain                 Nolon Nations

## 2017-06-10 NOTE — Transfer of Care (Signed)
Immediate Anesthesia Transfer of Care Note  Patient: Willie Pineda  Procedure(s) Performed: Procedure(s): REPAIR STRABISMUS BILATERAL (Bilateral)  Patient Location: PACU  Anesthesia Type:General  Level of Consciousness: awake and drowsy  Airway & Oxygen Therapy: Patient Spontanous Breathing and Patient connected to face mask oxygen  Post-op Assessment: Report given to RN and Post -op Vital signs reviewed and stable  Post vital signs: Reviewed and stable  Last Vitals:  Vitals:   06/10/17 0921 06/10/17 1334  BP: (!) 144/84 (P) 101/66  Pulse: (!) 48 100  Resp: 17 (!) 21  Temp: 36.4 C     Last Pain:  Vitals:   06/10/17 0921  TempSrc: Oral  PainSc: 0-No pain         Complications: No apparent anesthesia complications

## 2017-06-10 NOTE — H&P (View-Only) (Signed)
Date of examination:  06-01-17  Indication for surgery: to straighten the eyes and allow some binocularity  Pertinent past medical history:  Past Medical History:  Diagnosis Date  . Arthritis    "hands primarily" (05/05/2016)  . Atrial fibrillation ([place])   . Back pain    age related  . CHF (congestive heart failure) ([place])   . Complication of anesthesia    Bronchial spasms  . COPD (chronic obstructive pulmonary disease) ([facility])    inhalers as needed  . Depression    takes Zoloft daily  . Dyspnea    with exertion  . Dysrhythmia    A Fib-takes Xarelto daily  . Enlarged prostate    benign  . GERD (gastroesophageal reflux disease)    takes Omeprazole daily  . 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 Hyperlipidemia    takes Atorvastatin daily  . Hypertension    takes Metoprolol,HCTZ,and Losartan daily.Takes Clonidine if needed  . Nocturia   . Peripheral neuropathy    takes Gabapentin daily  . Pneumonia ?2015  . Restless leg   . Sleep apnea   . Stroke [facility]) 2012   denies residual on 05/05/2016  . Type II diabetes mellitus (H[facility])    borderline but doesn't take any meds (05/05/2016)    Pertinent ocular history:  LXT most of his life by history, strabismus surgery '96, details unavailable "didn't help."  Tried prism.  Covers OD to read  Pertinent family history: No family history on file.  General:  Healthy appearing patient in no distress.    Eyes:    Acuity  cc OD 20/50  OS 20/40  External: Within normal limits     Anterior segment:  PCIOL OU  Motility:   XT 35 comitant.  LHT 6 greater in right gaze.  XT'45.  2- elevation and depression of both eyes 1- adduction and abduction both eyes  Fundus: deferred  Refraction:  OD plano SE approx  OS -2 SE appeox  Heart: Regular rate and rhythm without murmur     Lungs: Clear to auscultation      Impression:1)  Exotropia residual s/p previous strabismus surgery, details unavailable  2) Hx  brainstem CVA '12  3) Afib cleared by cardiology  4) Sleep apnea  Plan: Explore medial and lateral rectus muscle both eyes.  Recess or re-recess one or both lateral rectus muscles, and resect or advance one or both medial rectus muscles  Willie Pineda O

## 2017-06-10 NOTE — Op Note (Signed)
06/10/2017  1:24 PM  PATIENT:  Willie Pineda    PRE-OPERATIVE DIAGNOSIS:  EXOTROPIA, residual vs. recurrent  POST-OPERATIVE DIAGNOSIS:  same  PROCEDURE:  1.  Explore medial and lateral rectus muscle, both eyes  2. Medial rectus muscle re-resection, 6.0 mm both eyes  SURGEON:  Derry Skill, MD  ANESTHESIA:   General  COMPLICATIONS: none  OPERATIVE PROCEDURE: After preoperative evaluation including cardiology clearance and including informed consent, the patient was taken to the operating room where he was identified by me. General anesthesia was induced without difficulty after placement of appropriate monitors. The patient was prepped and draped in standard sterile fashion. A lid speculum was placed in each eye. Forced ductions showed moderate limitation to forced adduction and abduction of each eye.. There was conjunctival scarring over each of the 4 rectus muscles, consistent with previous surgery.  A 6-0 silk traction sutures placed at the temporal limbus of the left eye, and this was used to draw the eye nasally. A Swan incision was made over the original insertion of the left lateral rectus muscle. The muscle was not found at the insertion, indicating that it had been previously recessed. The muscle was engaged on a muscle hook (from the superior side of the muscle, to avoid inadvertently hooking the inferior oblique muscle). The muscle was found approximately 8 mm posterior to the original insertion; the muscle was not dissected to make a precise measurement of the amount of recession.  The traction suture was repositioned to the nasal limbus, to draw the eye temporally. A Swan incision was made over the medial rectus insertion. The medial rectus was found at approximately the level of the original insertion, with extensive scar tissue around it, consistent with a previous resection.  The right medial and lateral rectus muscles were similarly explored, and again the lateral rectus  was found recessed in the range of 8 mm, and the medial rectus was its original location but with extensive scar tissue, consistent with a previous resection.  The left medial rectus muscle was engaged on a series of muscle hooks and cleared of its surrounding fascial attachments and scar tissue. The muscle was spread between 2 self-retaining hooks. A 2 mm bite was taken of the center of the muscle belly at a measured distance of 6.0 mm posterior to the current insertion, and a knot was tied securely at this location. The needle at each end of the double-armed suture was passed from the center of the muscle belly to the periphery, parallel to and 6.0 mm posterior to the insertion. A locking bite was placed at each border of the muscle. A resection clamp was placed on the muscle just anterior to the sutures. The muscle was disinserted. Each pole suture was passed posteriorly to anteriorly through the corresponding end of the original muscle stump, then anteriorly to posteriorly near the center of the stump, then posteriorly to anteriorly through the center of the muscle belly, just posterior to the previously placed knot. The muscle was drawn up to the level of the original insertion. All slack was removed before the suture ends were tied securely. The clamp was removed. The portion of the muscle anterior to the sutures was carefully excised. The conjunctival incisions over the medial and lateral rectus muscles were each closed with a single 6-0 plain gut suture. The traction suture was removed.   The right medial rectus muscle was re-resected 6.0 millimeters, just as described for the left medial rectus muscle. The conjunctival incisions over  the right medial and lateral rectus muscle were each closed with a single 6-0 plain gut suture. Tobradex ophthalmic ointment was placed in each eye. The patient was awakened without difficulty and taken to the recovery room in stable condition, having suffered no  intraoperative or immediate postoperative complications.  The  Derry Skill, MD

## 2017-06-13 ENCOUNTER — Encounter (HOSPITAL_BASED_OUTPATIENT_CLINIC_OR_DEPARTMENT_OTHER): Payer: Self-pay | Admitting: Ophthalmology

## 2017-06-13 NOTE — Addendum Note (Signed)
Addendum  created 06/13/17 1007 by Tawni Millers, CRNA   Charge Capture section accepted

## 2017-06-14 DIAGNOSIS — H903 Sensorineural hearing loss, bilateral: Secondary | ICD-10-CM | POA: Diagnosis not present

## 2017-06-14 DIAGNOSIS — H838X3 Other specified diseases of inner ear, bilateral: Secondary | ICD-10-CM | POA: Diagnosis not present

## 2017-06-14 DIAGNOSIS — H905 Unspecified sensorineural hearing loss: Secondary | ICD-10-CM | POA: Diagnosis not present

## 2017-06-27 DIAGNOSIS — I10 Essential (primary) hypertension: Secondary | ICD-10-CM | POA: Diagnosis not present

## 2017-06-27 DIAGNOSIS — Q159 Congenital malformation of eye, unspecified: Secondary | ICD-10-CM | POA: Diagnosis not present

## 2017-07-01 DIAGNOSIS — G4733 Obstructive sleep apnea (adult) (pediatric): Secondary | ICD-10-CM | POA: Diagnosis not present

## 2017-07-04 DIAGNOSIS — I10 Essential (primary) hypertension: Secondary | ICD-10-CM | POA: Diagnosis not present

## 2017-07-04 DIAGNOSIS — G4733 Obstructive sleep apnea (adult) (pediatric): Secondary | ICD-10-CM | POA: Diagnosis not present

## 2017-07-04 DIAGNOSIS — I482 Chronic atrial fibrillation: Secondary | ICD-10-CM | POA: Diagnosis not present

## 2017-07-05 DIAGNOSIS — H5010 Unspecified exotropia: Secondary | ICD-10-CM | POA: Diagnosis not present

## 2017-07-05 DIAGNOSIS — H40003 Preglaucoma, unspecified, bilateral: Secondary | ICD-10-CM | POA: Diagnosis not present

## 2017-07-05 DIAGNOSIS — H35372 Puckering of macula, left eye: Secondary | ICD-10-CM | POA: Diagnosis not present

## 2017-07-05 DIAGNOSIS — H02403 Unspecified ptosis of bilateral eyelids: Secondary | ICD-10-CM | POA: Diagnosis not present

## 2017-07-08 DIAGNOSIS — I10 Essential (primary) hypertension: Secondary | ICD-10-CM | POA: Diagnosis not present

## 2017-07-21 NOTE — Addendum Note (Signed)
Addendum  created 07/21/17 0942 by Willa Frater, CRNA   Anesthesia Intra Meds edited

## 2017-09-02 DIAGNOSIS — F322 Major depressive disorder, single episode, severe without psychotic features: Secondary | ICD-10-CM | POA: Diagnosis not present

## 2017-09-02 DIAGNOSIS — Z0000 Encounter for general adult medical examination without abnormal findings: Secondary | ICD-10-CM | POA: Diagnosis not present

## 2017-09-02 DIAGNOSIS — Z1389 Encounter for screening for other disorder: Secondary | ICD-10-CM | POA: Diagnosis not present

## 2017-09-02 DIAGNOSIS — Z23 Encounter for immunization: Secondary | ICD-10-CM | POA: Diagnosis not present

## 2017-09-27 DIAGNOSIS — L570 Actinic keratosis: Secondary | ICD-10-CM | POA: Diagnosis not present

## 2017-09-27 DIAGNOSIS — L820 Inflamed seborrheic keratosis: Secondary | ICD-10-CM | POA: Diagnosis not present

## 2017-09-28 DIAGNOSIS — E119 Type 2 diabetes mellitus without complications: Secondary | ICD-10-CM | POA: Diagnosis not present

## 2017-09-28 DIAGNOSIS — M2550 Pain in unspecified joint: Secondary | ICD-10-CM | POA: Diagnosis not present

## 2017-09-28 DIAGNOSIS — E0840 Diabetes mellitus due to underlying condition with diabetic neuropathy, unspecified: Secondary | ICD-10-CM | POA: Diagnosis not present

## 2017-10-03 DIAGNOSIS — G4733 Obstructive sleep apnea (adult) (pediatric): Secondary | ICD-10-CM | POA: Diagnosis not present

## 2017-10-04 DIAGNOSIS — I10 Essential (primary) hypertension: Secondary | ICD-10-CM | POA: Diagnosis not present

## 2017-10-04 DIAGNOSIS — I482 Chronic atrial fibrillation: Secondary | ICD-10-CM | POA: Diagnosis not present

## 2017-10-04 DIAGNOSIS — G4733 Obstructive sleep apnea (adult) (pediatric): Secondary | ICD-10-CM | POA: Diagnosis not present

## 2017-10-04 DIAGNOSIS — I429 Cardiomyopathy, unspecified: Secondary | ICD-10-CM | POA: Diagnosis not present

## 2017-10-13 DIAGNOSIS — G4733 Obstructive sleep apnea (adult) (pediatric): Secondary | ICD-10-CM | POA: Diagnosis not present

## 2017-10-13 DIAGNOSIS — I428 Other cardiomyopathies: Secondary | ICD-10-CM | POA: Diagnosis not present

## 2017-10-13 DIAGNOSIS — I10 Essential (primary) hypertension: Secondary | ICD-10-CM | POA: Diagnosis not present

## 2017-10-13 DIAGNOSIS — Z8673 Personal history of transient ischemic attack (TIA), and cerebral infarction without residual deficits: Secondary | ICD-10-CM | POA: Diagnosis not present

## 2017-10-13 DIAGNOSIS — I482 Chronic atrial fibrillation: Secondary | ICD-10-CM | POA: Diagnosis not present

## 2017-10-13 DIAGNOSIS — Z9989 Dependence on other enabling machines and devices: Secondary | ICD-10-CM | POA: Diagnosis not present

## 2018-01-09 DIAGNOSIS — H40003 Preglaucoma, unspecified, bilateral: Secondary | ICD-10-CM | POA: Diagnosis not present

## 2018-01-09 DIAGNOSIS — H527 Unspecified disorder of refraction: Secondary | ICD-10-CM | POA: Diagnosis not present

## 2018-01-09 DIAGNOSIS — H5089 Other specified strabismus: Secondary | ICD-10-CM | POA: Diagnosis not present

## 2018-01-09 DIAGNOSIS — Z961 Presence of intraocular lens: Secondary | ICD-10-CM | POA: Diagnosis not present

## 2018-01-09 DIAGNOSIS — H04123 Dry eye syndrome of bilateral lacrimal glands: Secondary | ICD-10-CM | POA: Diagnosis not present

## 2018-01-09 DIAGNOSIS — H26492 Other secondary cataract, left eye: Secondary | ICD-10-CM | POA: Diagnosis not present

## 2018-01-09 DIAGNOSIS — H5010 Unspecified exotropia: Secondary | ICD-10-CM | POA: Diagnosis not present

## 2018-01-09 DIAGNOSIS — H35372 Puckering of macula, left eye: Secondary | ICD-10-CM | POA: Diagnosis not present

## 2018-01-09 DIAGNOSIS — H02403 Unspecified ptosis of bilateral eyelids: Secondary | ICD-10-CM | POA: Diagnosis not present

## 2018-01-11 DIAGNOSIS — Z0000 Encounter for general adult medical examination without abnormal findings: Secondary | ICD-10-CM | POA: Diagnosis not present

## 2018-01-11 DIAGNOSIS — R202 Paresthesia of skin: Secondary | ICD-10-CM | POA: Diagnosis not present

## 2018-01-11 DIAGNOSIS — M1711 Unilateral primary osteoarthritis, right knee: Secondary | ICD-10-CM | POA: Diagnosis not present

## 2018-01-11 DIAGNOSIS — I951 Orthostatic hypotension: Secondary | ICD-10-CM | POA: Diagnosis not present

## 2018-01-11 DIAGNOSIS — E1165 Type 2 diabetes mellitus with hyperglycemia: Secondary | ICD-10-CM | POA: Diagnosis not present

## 2018-01-11 DIAGNOSIS — E781 Pure hyperglyceridemia: Secondary | ICD-10-CM | POA: Diagnosis not present

## 2018-01-11 DIAGNOSIS — G4733 Obstructive sleep apnea (adult) (pediatric): Secondary | ICD-10-CM | POA: Diagnosis not present

## 2018-01-11 DIAGNOSIS — R0609 Other forms of dyspnea: Secondary | ICD-10-CM | POA: Diagnosis not present

## 2018-01-11 DIAGNOSIS — I4891 Unspecified atrial fibrillation: Secondary | ICD-10-CM | POA: Diagnosis not present

## 2018-01-11 DIAGNOSIS — M25572 Pain in left ankle and joints of left foot: Secondary | ICD-10-CM | POA: Diagnosis not present

## 2018-01-11 DIAGNOSIS — I10 Essential (primary) hypertension: Secondary | ICD-10-CM | POA: Diagnosis not present

## 2018-01-11 DIAGNOSIS — E559 Vitamin D deficiency, unspecified: Secondary | ICD-10-CM | POA: Diagnosis not present

## 2018-01-11 DIAGNOSIS — M545 Low back pain: Secondary | ICD-10-CM | POA: Diagnosis not present

## 2018-01-11 DIAGNOSIS — R55 Syncope and collapse: Secondary | ICD-10-CM | POA: Diagnosis not present

## 2018-01-11 DIAGNOSIS — M25571 Pain in right ankle and joints of right foot: Secondary | ICD-10-CM | POA: Diagnosis not present

## 2018-01-11 DIAGNOSIS — Z125 Encounter for screening for malignant neoplasm of prostate: Secondary | ICD-10-CM | POA: Diagnosis not present

## 2018-01-23 DIAGNOSIS — E785 Hyperlipidemia, unspecified: Secondary | ICD-10-CM | POA: Diagnosis not present

## 2018-01-23 DIAGNOSIS — E0840 Diabetes mellitus due to underlying condition with diabetic neuropathy, unspecified: Secondary | ICD-10-CM | POA: Diagnosis not present

## 2018-01-23 DIAGNOSIS — E1165 Type 2 diabetes mellitus with hyperglycemia: Secondary | ICD-10-CM | POA: Diagnosis not present

## 2018-01-23 DIAGNOSIS — I4891 Unspecified atrial fibrillation: Secondary | ICD-10-CM | POA: Diagnosis not present

## 2018-01-23 DIAGNOSIS — F322 Major depressive disorder, single episode, severe without psychotic features: Secondary | ICD-10-CM | POA: Diagnosis not present

## 2018-01-23 DIAGNOSIS — I10 Essential (primary) hypertension: Secondary | ICD-10-CM | POA: Diagnosis not present

## 2018-01-23 DIAGNOSIS — J449 Chronic obstructive pulmonary disease, unspecified: Secondary | ICD-10-CM | POA: Diagnosis not present

## 2018-01-23 DIAGNOSIS — E781 Pure hyperglyceridemia: Secondary | ICD-10-CM | POA: Diagnosis not present

## 2018-01-23 DIAGNOSIS — I509 Heart failure, unspecified: Secondary | ICD-10-CM | POA: Diagnosis not present

## 2018-01-30 DIAGNOSIS — I10 Essential (primary) hypertension: Secondary | ICD-10-CM | POA: Diagnosis not present

## 2018-01-30 DIAGNOSIS — I509 Heart failure, unspecified: Secondary | ICD-10-CM | POA: Diagnosis not present

## 2018-01-30 DIAGNOSIS — E0840 Diabetes mellitus due to underlying condition with diabetic neuropathy, unspecified: Secondary | ICD-10-CM | POA: Diagnosis not present

## 2018-01-30 DIAGNOSIS — R0609 Other forms of dyspnea: Secondary | ICD-10-CM | POA: Diagnosis not present

## 2018-01-30 DIAGNOSIS — I951 Orthostatic hypotension: Secondary | ICD-10-CM | POA: Diagnosis not present

## 2018-01-30 DIAGNOSIS — R202 Paresthesia of skin: Secondary | ICD-10-CM | POA: Diagnosis not present

## 2018-01-30 DIAGNOSIS — G4733 Obstructive sleep apnea (adult) (pediatric): Secondary | ICD-10-CM | POA: Diagnosis not present

## 2018-01-30 DIAGNOSIS — I4891 Unspecified atrial fibrillation: Secondary | ICD-10-CM | POA: Diagnosis not present

## 2018-02-01 DIAGNOSIS — H5015 Alternating exotropia: Secondary | ICD-10-CM | POA: Diagnosis not present

## 2018-03-10 DIAGNOSIS — I509 Heart failure, unspecified: Secondary | ICD-10-CM | POA: Diagnosis not present

## 2018-03-10 DIAGNOSIS — E781 Pure hyperglyceridemia: Secondary | ICD-10-CM | POA: Diagnosis not present

## 2018-03-10 DIAGNOSIS — E785 Hyperlipidemia, unspecified: Secondary | ICD-10-CM | POA: Diagnosis not present

## 2018-03-10 DIAGNOSIS — E0840 Diabetes mellitus due to underlying condition with diabetic neuropathy, unspecified: Secondary | ICD-10-CM | POA: Diagnosis not present

## 2018-03-10 DIAGNOSIS — I4891 Unspecified atrial fibrillation: Secondary | ICD-10-CM | POA: Diagnosis not present

## 2018-03-10 DIAGNOSIS — I10 Essential (primary) hypertension: Secondary | ICD-10-CM | POA: Diagnosis not present

## 2018-03-10 DIAGNOSIS — J449 Chronic obstructive pulmonary disease, unspecified: Secondary | ICD-10-CM | POA: Diagnosis not present

## 2018-03-10 DIAGNOSIS — F322 Major depressive disorder, single episode, severe without psychotic features: Secondary | ICD-10-CM | POA: Diagnosis not present

## 2018-03-10 DIAGNOSIS — E1165 Type 2 diabetes mellitus with hyperglycemia: Secondary | ICD-10-CM | POA: Diagnosis not present

## 2018-03-22 DIAGNOSIS — E0840 Diabetes mellitus due to underlying condition with diabetic neuropathy, unspecified: Secondary | ICD-10-CM | POA: Diagnosis not present

## 2018-03-22 DIAGNOSIS — G4733 Obstructive sleep apnea (adult) (pediatric): Secondary | ICD-10-CM | POA: Diagnosis not present

## 2018-03-22 DIAGNOSIS — I951 Orthostatic hypotension: Secondary | ICD-10-CM | POA: Diagnosis not present

## 2018-03-22 DIAGNOSIS — E1165 Type 2 diabetes mellitus with hyperglycemia: Secondary | ICD-10-CM | POA: Diagnosis not present

## 2018-03-22 DIAGNOSIS — M545 Low back pain: Secondary | ICD-10-CM | POA: Diagnosis not present

## 2018-03-22 DIAGNOSIS — Z6841 Body Mass Index (BMI) 40.0 and over, adult: Secondary | ICD-10-CM | POA: Diagnosis not present

## 2018-03-22 DIAGNOSIS — I4891 Unspecified atrial fibrillation: Secondary | ICD-10-CM | POA: Diagnosis not present

## 2018-03-22 DIAGNOSIS — I10 Essential (primary) hypertension: Secondary | ICD-10-CM | POA: Diagnosis not present

## 2018-03-22 DIAGNOSIS — I509 Heart failure, unspecified: Secondary | ICD-10-CM | POA: Diagnosis not present

## 2018-03-22 DIAGNOSIS — R5383 Other fatigue: Secondary | ICD-10-CM | POA: Diagnosis not present

## 2018-03-27 DIAGNOSIS — H02834 Dermatochalasis of left upper eyelid: Secondary | ICD-10-CM | POA: Diagnosis not present

## 2018-03-27 DIAGNOSIS — H40003 Preglaucoma, unspecified, bilateral: Secondary | ICD-10-CM | POA: Diagnosis not present

## 2018-03-27 DIAGNOSIS — H35372 Puckering of macula, left eye: Secondary | ICD-10-CM | POA: Diagnosis not present

## 2018-03-27 DIAGNOSIS — H527 Unspecified disorder of refraction: Secondary | ICD-10-CM | POA: Diagnosis not present

## 2018-03-27 DIAGNOSIS — H5089 Other specified strabismus: Secondary | ICD-10-CM | POA: Diagnosis not present

## 2018-03-27 DIAGNOSIS — H26492 Other secondary cataract, left eye: Secondary | ICD-10-CM | POA: Diagnosis not present

## 2018-03-27 DIAGNOSIS — Z961 Presence of intraocular lens: Secondary | ICD-10-CM | POA: Diagnosis not present

## 2018-03-27 DIAGNOSIS — H5010 Unspecified exotropia: Secondary | ICD-10-CM | POA: Diagnosis not present

## 2018-03-27 DIAGNOSIS — H02403 Unspecified ptosis of bilateral eyelids: Secondary | ICD-10-CM | POA: Diagnosis not present

## 2018-03-27 DIAGNOSIS — H04123 Dry eye syndrome of bilateral lacrimal glands: Secondary | ICD-10-CM | POA: Diagnosis not present

## 2018-03-27 DIAGNOSIS — H02831 Dermatochalasis of right upper eyelid: Secondary | ICD-10-CM | POA: Diagnosis not present

## 2018-07-12 DIAGNOSIS — I4891 Unspecified atrial fibrillation: Secondary | ICD-10-CM | POA: Diagnosis not present

## 2018-07-12 DIAGNOSIS — E1165 Type 2 diabetes mellitus with hyperglycemia: Secondary | ICD-10-CM | POA: Diagnosis not present

## 2018-07-12 DIAGNOSIS — I951 Orthostatic hypotension: Secondary | ICD-10-CM | POA: Diagnosis not present

## 2018-07-12 DIAGNOSIS — G4733 Obstructive sleep apnea (adult) (pediatric): Secondary | ICD-10-CM | POA: Diagnosis not present

## 2018-07-12 DIAGNOSIS — L97929 Non-pressure chronic ulcer of unspecified part of left lower leg with unspecified severity: Secondary | ICD-10-CM | POA: Diagnosis not present

## 2018-07-12 DIAGNOSIS — I10 Essential (primary) hypertension: Secondary | ICD-10-CM | POA: Diagnosis not present

## 2018-07-12 DIAGNOSIS — M545 Low back pain: Secondary | ICD-10-CM | POA: Diagnosis not present

## 2018-07-12 DIAGNOSIS — R5383 Other fatigue: Secondary | ICD-10-CM | POA: Diagnosis not present

## 2018-07-12 DIAGNOSIS — E291 Testicular hypofunction: Secondary | ICD-10-CM | POA: Diagnosis not present

## 2018-07-12 DIAGNOSIS — I509 Heart failure, unspecified: Secondary | ICD-10-CM | POA: Diagnosis not present

## 2018-07-13 DIAGNOSIS — G4733 Obstructive sleep apnea (adult) (pediatric): Secondary | ICD-10-CM | POA: Diagnosis not present

## 2018-07-13 DIAGNOSIS — I482 Chronic atrial fibrillation: Secondary | ICD-10-CM | POA: Diagnosis not present

## 2018-07-13 DIAGNOSIS — Z8673 Personal history of transient ischemic attack (TIA), and cerebral infarction without residual deficits: Secondary | ICD-10-CM | POA: Diagnosis not present

## 2018-07-13 DIAGNOSIS — Z9989 Dependence on other enabling machines and devices: Secondary | ICD-10-CM | POA: Diagnosis not present

## 2018-07-13 DIAGNOSIS — I10 Essential (primary) hypertension: Secondary | ICD-10-CM | POA: Diagnosis not present

## 2018-07-13 DIAGNOSIS — I428 Other cardiomyopathies: Secondary | ICD-10-CM | POA: Diagnosis not present

## 2018-08-21 DIAGNOSIS — H04123 Dry eye syndrome of bilateral lacrimal glands: Secondary | ICD-10-CM | POA: Diagnosis not present

## 2018-08-21 DIAGNOSIS — H26493 Other secondary cataract, bilateral: Secondary | ICD-10-CM | POA: Diagnosis not present

## 2018-08-21 DIAGNOSIS — H02403 Unspecified ptosis of bilateral eyelids: Secondary | ICD-10-CM | POA: Diagnosis not present

## 2018-08-21 DIAGNOSIS — H35372 Puckering of macula, left eye: Secondary | ICD-10-CM | POA: Diagnosis not present

## 2018-08-21 DIAGNOSIS — Z961 Presence of intraocular lens: Secondary | ICD-10-CM | POA: Diagnosis not present

## 2018-08-21 DIAGNOSIS — H40003 Preglaucoma, unspecified, bilateral: Secondary | ICD-10-CM | POA: Diagnosis not present

## 2018-08-21 DIAGNOSIS — H43811 Vitreous degeneration, right eye: Secondary | ICD-10-CM | POA: Diagnosis not present

## 2018-08-21 DIAGNOSIS — H5010 Unspecified exotropia: Secondary | ICD-10-CM | POA: Diagnosis not present

## 2018-08-21 DIAGNOSIS — H02834 Dermatochalasis of left upper eyelid: Secondary | ICD-10-CM | POA: Diagnosis not present

## 2018-08-21 DIAGNOSIS — H527 Unspecified disorder of refraction: Secondary | ICD-10-CM | POA: Diagnosis not present

## 2018-08-21 DIAGNOSIS — H02831 Dermatochalasis of right upper eyelid: Secondary | ICD-10-CM | POA: Diagnosis not present

## 2018-08-21 DIAGNOSIS — H5089 Other specified strabismus: Secondary | ICD-10-CM | POA: Diagnosis not present

## 2018-09-14 DIAGNOSIS — Z23 Encounter for immunization: Secondary | ICD-10-CM | POA: Diagnosis not present

## 2018-09-18 DIAGNOSIS — G4733 Obstructive sleep apnea (adult) (pediatric): Secondary | ICD-10-CM | POA: Diagnosis not present

## 2018-09-26 DIAGNOSIS — I10 Essential (primary) hypertension: Secondary | ICD-10-CM | POA: Diagnosis not present

## 2018-09-26 DIAGNOSIS — I4891 Unspecified atrial fibrillation: Secondary | ICD-10-CM | POA: Diagnosis not present

## 2018-09-26 DIAGNOSIS — Z8673 Personal history of transient ischemic attack (TIA), and cerebral infarction without residual deficits: Secondary | ICD-10-CM | POA: Diagnosis not present

## 2018-09-26 DIAGNOSIS — R0602 Shortness of breath: Secondary | ICD-10-CM | POA: Diagnosis not present

## 2018-09-26 DIAGNOSIS — G4733 Obstructive sleep apnea (adult) (pediatric): Secondary | ICD-10-CM | POA: Diagnosis not present

## 2018-09-26 DIAGNOSIS — I482 Chronic atrial fibrillation, unspecified: Secondary | ICD-10-CM | POA: Diagnosis not present

## 2018-09-26 DIAGNOSIS — Z9989 Dependence on other enabling machines and devices: Secondary | ICD-10-CM | POA: Diagnosis not present

## 2018-09-26 DIAGNOSIS — I428 Other cardiomyopathies: Secondary | ICD-10-CM | POA: Diagnosis not present

## 2018-09-27 DIAGNOSIS — R0602 Shortness of breath: Secondary | ICD-10-CM | POA: Diagnosis not present

## 2018-09-28 DIAGNOSIS — R202 Paresthesia of skin: Secondary | ICD-10-CM | POA: Diagnosis not present

## 2018-09-28 DIAGNOSIS — E0840 Diabetes mellitus due to underlying condition with diabetic neuropathy, unspecified: Secondary | ICD-10-CM | POA: Diagnosis not present

## 2018-09-28 DIAGNOSIS — I4891 Unspecified atrial fibrillation: Secondary | ICD-10-CM | POA: Diagnosis not present

## 2018-09-28 DIAGNOSIS — I509 Heart failure, unspecified: Secondary | ICD-10-CM | POA: Diagnosis not present

## 2018-09-28 DIAGNOSIS — R209 Unspecified disturbances of skin sensation: Secondary | ICD-10-CM | POA: Diagnosis not present

## 2018-10-11 DIAGNOSIS — I4891 Unspecified atrial fibrillation: Secondary | ICD-10-CM | POA: Diagnosis not present

## 2018-10-11 DIAGNOSIS — I428 Other cardiomyopathies: Secondary | ICD-10-CM | POA: Diagnosis not present

## 2018-10-11 DIAGNOSIS — I10 Essential (primary) hypertension: Secondary | ICD-10-CM | POA: Diagnosis not present

## 2018-10-11 DIAGNOSIS — Z8673 Personal history of transient ischemic attack (TIA), and cerebral infarction without residual deficits: Secondary | ICD-10-CM | POA: Diagnosis not present

## 2018-10-11 DIAGNOSIS — Z9989 Dependence on other enabling machines and devices: Secondary | ICD-10-CM | POA: Diagnosis not present

## 2018-10-11 DIAGNOSIS — G4733 Obstructive sleep apnea (adult) (pediatric): Secondary | ICD-10-CM | POA: Diagnosis not present

## 2018-10-25 DIAGNOSIS — L570 Actinic keratosis: Secondary | ICD-10-CM | POA: Diagnosis not present

## 2018-10-25 DIAGNOSIS — I781 Nevus, non-neoplastic: Secondary | ICD-10-CM | POA: Diagnosis not present

## 2018-10-25 DIAGNOSIS — L720 Epidermal cyst: Secondary | ICD-10-CM | POA: Diagnosis not present

## 2018-10-25 DIAGNOSIS — D485 Neoplasm of uncertain behavior of skin: Secondary | ICD-10-CM | POA: Diagnosis not present

## 2018-12-04 ENCOUNTER — Ambulatory Visit (INDEPENDENT_AMBULATORY_CARE_PROVIDER_SITE_OTHER): Payer: Medicare Other | Admitting: Neurology

## 2018-12-04 ENCOUNTER — Encounter: Payer: Self-pay | Admitting: Neurology

## 2018-12-04 VITALS — BP 128/64 | HR 61 | Ht 72.0 in | Wt 297.0 lb

## 2018-12-04 DIAGNOSIS — R202 Paresthesia of skin: Secondary | ICD-10-CM | POA: Diagnosis not present

## 2018-12-04 NOTE — Progress Notes (Signed)
Reason for visit: Numbness  Referring physician: Dr. Inda Merlin  Willie Pineda is a 72 y.o. male  History of present illness:  Willie Pineda is a 72 year old right-handed white male with a history of obesity, cerebrovascular disease, and diabetes.  The patient has had a 4 or 5-year history of some numbness in the feet, 20 years ago he injured his right shoulder and has had some limitation of mobility since that time.  About 6 months ago he began noting some numbness in the fingers of the left hand, he has had some occasional lancinating pains in the palm of the hand.  The discomfort may awaken him at night while sleeping.  He denies any significant neck pain or pain down the arm, he does have some low back pain at times.  He denies issues controlling the bowels or the bladder.  He does have some discomfort in the left thumb at times as well.  He is sent to this office for an evaluation of the new left hand numbness.  Past Medical History:  Diagnosis Date  . Arthritis    "hands primarily" (05/05/2016)  . Atrial fibrillation ([place])   . Back pain    age related  . CHF (congestive heart failure) ([place])   . Complication of anesthesia    Bronchial spasms  . COPD (chronic obstructive pulmonary disease) ([facility])    inhalers as needed  . Depression    takes Zoloft daily  . Dyspnea    with exertion  . Dysrhythmia    A Fib-takes Xarelto daily  . Enlarged prostate    benign  . GERD (gastroesophageal reflux disease)    takes Omeprazole daily  . 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 Hyperlipidemia    takes Atorvastatin daily  . Hypertension    takes Metoprolol,HCTZ,and Losartan daily.Takes Clonidine if needed  . Nocturia   . Peripheral neuropathy    takes Gabapentin daily  . Pneumonia ?2015  . Restless leg   . Sleep apnea    uses Bipap  . Stroke [facility]) 2012   denies residual on 05/05/2016  . Type II diabetes mellitus ([place])    borderline but doesn't take any  meds (05/05/2016)    Past Surgical History:  Procedure Laterality Date  . CATARACT EXTRACTION W/ INTRAOCULAR LENS  IMPLANT, BILATERAL Bilateral   . COLONOSCOPY WITH PROPOFOL N/A 03/09/2017   Procedure: COLONOSCOPY WITH PROPOFOL;  Surgeon: Wonda Horner, MD;  Location: [facility] ENDOSCOPY;  Service: Endoscopy;  Laterality: N/A;  . I&D KNEE WITH POLY EXCHANGE Right 05/04/2016   IRRIGATION AND DEBRIDEMENT Right KNEE WITH UNICOMPARTMENTAL POLY EXCHANGE  . I&D KNEE WITH POLY EXCHANGE Right 05/04/2016   Procedure: IRRIGATION AND DEBRIDEMENT Right KNEE WITH UNICOMPARTMENTAL POLY EXCHANGE;  Surgeon: Renette Butters, MD;  Location: [place];  Service: Orthopedics;  Laterality: Right;  . JOINT REPLACEMENT    . LAPAROSCOPIC CHOLECYSTECTOMY  1995  . PARTIAL KNEE ARTHROPLASTY Right 04/06/2016   Procedure: UNICOMPARTMENTAL RIGHT KNEE;  Surgeon: Renette Butters, MD;  Location: [place];  Service: Orthopedics;  Laterality: Right;  . STRABISMUS SURGERY Bilateral 06/10/2017   Procedure: REPAIR STRABISMUS BILATERAL;  Surgeon: Everitt Amber, MD;  Location: [place];  Service: Ophthalmology;  Laterality: Bilateral;  . TONSILLECTOMY AND ADENOIDECTOMY    . TOTAL KNEE ARTHROPLASTY Left 2008  . ULNAR NERVE TRANSPOSITION Right 1989    Family History  Problem Relation Age of Onset  .  CVA Mother   . Liver cancer Father     Social history:  reports that he quit smoking about 7 years ago. His smoking use included cigarettes. He has a 20.00 pack-year smoking history. He has quit using smokeless tobacco.  His smokeless tobacco use included chew. He reports current alcohol use. He reports that he does not use drugs.  Medications:  Prior to Admission medications   Medication Sig Start Date End Date Taking? Authorizing Provider  albuterol (PROVENTIL HFA;VENTOLIN HFA) 108 (90 Base) MCG/ACT inhaler Inhale 1-2 puffs into the lungs every 4 (four) hours as needed for wheezing or shortness of breath.   Yes [provider]  atorvastatin (LIPITOR) 20 MG tablet Take 20 mg by mouth daily.  02/06/16  Yes [provider]  cloNIDine (CATAPRES) 0.1 MG tablet Take 0.1 mg by mouth daily as needed (Only takes if pt if bp  > 160).   Yes [provider]  docusate sodium (COLACE) 100 MG capsule Take 1 capsule (100 mg total) by mouth 2 (two) times daily. Continue this while taking narcotics to help with bowel movements 04/07/16  Yes Renette Butters, MD  gabapentin (NEURONTIN) 300 MG capsule Take 600 mg by mouth at bedtime.   Yes [provider]  losartan (COZAAR) 100 MG tablet Take 100 mg by mouth daily.   Yes [provider]  metoprolol succinate (TOPROL-XL) 50 MG 24 hr tablet Take 50 mg by mouth daily. Take with or immediately following a meal.   Yes [provider]  omeprazole (PRILOSEC) 20 MG capsule Take 20 mg by mouth daily.   Yes [provider]  oxyCODONE-acetaminophen (ROXICET) 5-325 MG tablet Take 2 tablets by mouth every 4 (four) hours as needed. 04/07/16  Yes Renette Butters, MD  rivaroxaban (XARELTO) 20 MG TABS tablet Take 20 mg by mouth daily with supper.   Yes [provider]  sertraline (ZOLOFT) 100 MG tablet Take 100 mg by mouth daily.   Yes [provider]  torsemide (DEMADEX) 100 MG tablet TAKE 1 TABLET BY MOUTH DAILY 09/29/18  Yes [provider]      Allergies  Allergen Reactions  . Penicillins Anaphylaxis  . Dymista [Azelastine-Fluticasone] Other (See Comments)    Burning sensation   . Hydrocodone Other (See Comments)    SWEATING  . Spironolactone Other (See Comments)    Makes chest swelling - or as pt puts it "makes his breasts swell and sore"  . Warfarin And Related Other (See Comments)    Pt states it makes his INR critical levels    ROS:  Out of a complete 14 system review of symptoms, the patient complains only of the following symptoms, and all other reviewed systems are negative.  Hearing  loss Joint pain, joint swelling, cramps, aching muscles Difficulty swallowing Snoring, restless leg  Blood pressure 128/64, pulse 61, height 6' (1.829 m), weight 297 lb (134.7 kg), SpO2 93 %.  Physical Exam  General: The patient is alert and cooperative at the time of the examination.  The patient is markedly obese.  Eyes: Pupils are equal, round, and reactive to light. Discs are flat bilaterally.  Neck: The neck is supple, no carotid bruits are noted.  Respiratory: The respiratory examination is clear.  Cardiovascular: The cardiovascular examination reveals a regular rate and rhythm, no obvious murmurs or rubs are noted.  Skin: Extremities are without significant edema.  Neurologic Exam  Mental status: The patient is alert and oriented x 3 at  the time of the examination. The patient has apparent normal recent and remote memory, with an apparently normal attention span and concentration ability.  Cranial nerves: Facial symmetry is present. There is good sensation of the face to pinprick and soft touch bilaterally. The strength of the facial muscles and the muscles to head turning and shoulder shrug are normal bilaterally. Speech is well enunciated, no aphasia or dysarthria is noted. Extraocular movements are full.  Some exotropia is noted in the left eye on primary gaze.  Visual fields are full. The tongue is midline, and the patient has symmetric elevation of the soft palate. No obvious hearing deficits are noted.  Motor: The motor testing reveals 5 over 5 strength of all 4 extremities. Good symmetric motor tone is noted throughout.  Sensory: Sensory testing is intact to pinprick, soft touch, vibration sensation, and position sense on the upper extremities.  With the lower extremities there may be a stocking pattern pinprick sensory deficit to the knees bilaterally.  Some impairment of vibration sensation is noted bilaterally, right greater than left and position sense is impaired on  the left foot greater than the right.  No evidence of extinction is noted.  Coordination: Cerebellar testing reveals good finger-nose-finger and heel-to-shin bilaterally.  Gait and station: Gait is normal. Tandem gait is slightly unsteady. Romberg is negative. No drift is seen.  Reflexes: Deep tendon reflexes are symmetric, but are depressed bilaterally. Toes are downgoing bilaterally.   Assessment/Plan:  1.  Probable diabetic peripheral neuropathy  2.  Left hand numbness, possible carpal tunnel syndrome  The patient will be sent for nerve conduction studies on both arms and one leg, EMG on the left arm.  The patient likely has a rotator cuff injury on the left as well.  He will return for the above study.  Jill Alexanders MD 12/04/2018 3:04 PM  [facility] [address] [place], [place] [address]  Phone [phone] Fax [phone]

## 2018-12-07 DIAGNOSIS — H40003 Preglaucoma, unspecified, bilateral: Secondary | ICD-10-CM | POA: Diagnosis not present

## 2018-12-07 DIAGNOSIS — H04123 Dry eye syndrome of bilateral lacrimal glands: Secondary | ICD-10-CM | POA: Diagnosis not present

## 2019-01-02 DIAGNOSIS — I482 Chronic atrial fibrillation, unspecified: Secondary | ICD-10-CM | POA: Diagnosis not present

## 2019-01-02 DIAGNOSIS — G4733 Obstructive sleep apnea (adult) (pediatric): Secondary | ICD-10-CM | POA: Diagnosis not present

## 2019-01-02 DIAGNOSIS — R0602 Shortness of breath: Secondary | ICD-10-CM | POA: Diagnosis not present

## 2019-01-02 DIAGNOSIS — F419 Anxiety disorder, unspecified: Secondary | ICD-10-CM | POA: Diagnosis not present

## 2019-01-03 ENCOUNTER — Ambulatory Visit (INDEPENDENT_AMBULATORY_CARE_PROVIDER_SITE_OTHER): Payer: Medicare Other | Admitting: Neurology

## 2019-01-03 ENCOUNTER — Encounter: Payer: Self-pay | Admitting: Neurology

## 2019-01-03 DIAGNOSIS — R202 Paresthesia of skin: Secondary | ICD-10-CM | POA: Diagnosis not present

## 2019-01-03 DIAGNOSIS — E538 Deficiency of other specified B group vitamins: Secondary | ICD-10-CM

## 2019-01-03 DIAGNOSIS — E1142 Type 2 diabetes mellitus with diabetic polyneuropathy: Secondary | ICD-10-CM

## 2019-01-03 HISTORY — DX: Type 2 diabetes mellitus with diabetic polyneuropathy: E11.42

## 2019-01-03 NOTE — Procedures (Signed)
     HISTORY:  Willie Pineda is a 73 year old gentleman with diabetes who reports numbness primarily affecting the left hand.  The patient also has some numbness in the feet.  He is being evaluated for possible neuropathy or a radiculopathy.  NERVE CONDUCTION STUDIES:  Nerve conduction studies were performed on both upper extremities.  The distal motor latencies for the median nerves were prolonged on the left, normal on the right with a low motor amplitude on the left, normal on the right.  The distal motor latencies of the ulnar nerves were normal bilaterally with normal motor amplitudes for these nerves bilaterally.  Slowing was seen above and below the elbow on the left, normal on the right.  The sensory latencies for the median nerves were prolonged bilaterally, normal for the right ulnar nerve, and absent for the left ulnar nerve.  The F-wave latencies for the ulnar nerves were prolonged bilaterally.  Nerve conduction studies were performed on the left lower extremity.  The distal motor latencies for the left peroneal and posterior tibial nerves were normal with low motor amplitudes seen for these nerves.  Slowing was seen for these nerves.  The left sural and peroneal sensory latencies were unobtainable.  The left posterior tibial F wave latency was prolonged.  EMG STUDIES:  EMG study was performed on the left upper extremity:  The first dorsal interosseous muscle reveals 2 to 4 K units with minimally decreased recruitment. No fibrillations or positive waves were noted. The abductor pollicis brevis muscle reveals 2 to 4 K units with minimally decreased recruitment. No fibrillations or positive waves were noted. The extensor indicis proprius muscle reveals 1 to 3 K units with full recruitment. No fibrillations or positive waves were noted. The pronator teres muscle reveals 2 to 3 K units with full recruitment. No fibrillations or positive waves were noted.  The flexor carpi ulnaris muscle  (III-VI) revealed 2-3 K units with full recruitment.  No fibrillations or positive waves were noted. The biceps muscle reveals 1 to 2 K units with full recruitment. No fibrillations or positive waves were noted. The triceps muscle reveals 2 to 4 K units with full recruitment. No fibrillations or positive waves were noted. The anterior deltoid muscle reveals 2 to 3 K units with full recruitment. No fibrillations or positive waves were noted. The cervical paraspinal muscles were tested at 2 levels. No abnormalities of insertional activity were seen at either level tested. There was good relaxation.   IMPRESSION:  Nerve conduction studies done on both upper extremities and on the left lower extremity shows evidence of a generalized primarily axonal peripheral neuropathy of moderate severity, possibly secondary to diabetes.  There is evidence of a mild left carpal tunnel syndrome but there is also evidence of dysfunction of the right median nerve as well.  A left ulnar neuropathy is seen at the elbow.  EMG evaluation of the left upper extremity was relatively unremarkable, no evidence of an overlying cervical radiculopathy was seen.  Jill Alexanders MD 01/03/2019 2:51 PM  [facility] [address] [place], [place] [address]  Phone [phone] Fax [phone]

## 2019-01-03 NOTE — Progress Notes (Signed)
Please refer to EMG and nerve conduction procedure note.  

## 2019-01-03 NOTE — Progress Notes (Addendum)
The patient comes in today for EMG and nerve conduction study evaluation.  The nerve conduction show evidence of a mild left carpal tunnel syndrome and evidence of a left ulnar neuropathy at the elbow.  The patient has a generalized peripheral neuropathy as well of moderate severity, likely related to diabetes.  The patient will be sent for further blood work, he will wear a wrist splint on the left wrist, if no improvement is noted, he will contact our office and we will get him over to a hand surgeon.  He will need to be careful about hyperflexing the left elbow and putting pressure on the elbow.    [place]    Nerve / Sites Muscle Latency Ref. Amplitude Ref. Rel Amp Segments Distance Velocity Ref. Area    ms ms mV mV %  cm m/s m/s mVms  R Median - APB     Wrist APB 3.5 ?4.4 3.8 ?4.0 100 Wrist - APB 7   12.9     Upper arm APB 9.4  3.5  91.9 Upper arm - Wrist 24 41 ?49 13.2  L Median - APB     Wrist APB 4.1 ?4.4 5.9 ?4.0 100 Wrist - APB 7   21.7     Upper arm APB 8.5  4.8  82.5 Upper arm - Wrist 22 49 ?49 26.7  R Ulnar - ADM     Wrist ADM 2.3 ?3.3 10.4 ?6.0 100 Wrist - ADM 7   32.5     B.Elbow ADM 6.0  9.2  88.5 B.Elbow - Wrist 20 54 ?49 30.1     A.Elbow ADM 8.3  8.7  94.7 A.Elbow - B.Elbow 11 49 ?49 28.4         A.Elbow - Wrist      L Ulnar - ADM     Wrist ADM 2.1 ?3.3 11.2 ?6.0 100 Wrist - ADM 7   31.0     B.Elbow ADM 6.4  9.9  88.6 B.Elbow - Wrist 19 44 ?49 28.0     A.Elbow ADM 9.5  8.3  83.6 A.Elbow - B.Elbow 10 33 ?49 26.5         A.Elbow - Wrist      L Peroneal - EDB     Ankle EDB 4.7 ?6.5 1.5 ?2.0 100 Ankle - EDB 9   4.5     Fib head EDB 14.4  1.2  77.9 Fib head - Ankle 33 34 ?44 3.4     Pop fossa EDB 17.5  1.0  86.9 Pop fossa - Fib head 10 33 ?44 2.9         Pop fossa - Ankle      L Tibial - AH     Ankle AH 5.6 ?5.8 0.8 ?4.0 100 Ankle - AH 9   4.5     Pop fossa AH 17.3  0.5  54.3 Pop fossa - Ankle 41 35 ?41 1.3                  SNC    Nerve / Sites Rec. Site Peak Lat Ref.   Amp Ref. Segments Distance    ms ms V V  cm  L Sural - Ankle (Calf)     Calf Ankle NR ?4.4 NR ?6 Calf - Ankle 14  L Superficial peroneal - Ankle     Lat leg Ankle NR ?4.4 NR ?6 Lat leg - Ankle 14  R Median - Orthodromic (Dig II, Mid palm)  Dig II Wrist 3.8 ?3.4 6 ?10 Dig II - Wrist 13  L Median - Orthodromic (Dig II, Mid palm)     Dig II Wrist 3.9 ?3.4 6 ?10 Dig II - Wrist 13  R Ulnar - Orthodromic, (Dig V, Mid palm)     Dig V Wrist 3.0 ?3.1 6 ?5 Dig V - Wrist 11  L Ulnar - Orthodromic, (Dig V, Mid palm)     Dig V Wrist NR ?3.1 NR ?5 Dig V - Wrist 32                 F  Wave    Nerve F Lat Ref.   ms ms  R Ulnar - ADM 32.6 ?32.0  L Ulnar - ADM 33.9 ?32.0  L Tibial - AH 65.8 ?56.0

## 2019-01-08 DIAGNOSIS — R0602 Shortness of breath: Secondary | ICD-10-CM | POA: Diagnosis not present

## 2019-01-09 ENCOUNTER — Telehealth: Payer: Self-pay | Admitting: Neurology

## 2019-01-09 NOTE — Telephone Encounter (Signed)
Pt called asking for his lab results. Please advise.

## 2019-01-09 NOTE — Telephone Encounter (Signed)
Labs are pending/waiting on MD to review. Will call once resulted.

## 2019-01-10 LAB — MULTIPLE MYELOMA PANEL, SERUM
Albumin SerPl Elph-Mcnc: 3.3 g/dL (ref 2.9–4.4)
Albumin/Glob SerPl: 0.9 (ref 0.7–1.7)
Alpha 1: 0.3 g/dL (ref 0.0–0.4)
Alpha2 Glob SerPl Elph-Mcnc: 0.8 g/dL (ref 0.4–1.0)
B-Globulin SerPl Elph-Mcnc: 1.3 g/dL (ref 0.7–1.3)
Gamma Glob SerPl Elph-Mcnc: 1.5 g/dL (ref 0.4–1.8)
Globulin, Total: 3.9 g/dL (ref 2.2–3.9)
IgA/Immunoglobulin A, Serum: 345 mg/dL (ref 61–437)
IgG (Immunoglobin G), Serum: 1227 mg/dL (ref 700–1600)
IgM (Immunoglobulin M), Srm: 120 mg/dL (ref 15–143)
Total Protein: 7.2 g/dL (ref 6.0–8.5)

## 2019-01-10 LAB — ANGIOTENSIN CONVERTING ENZYME: Angio Convert Enzyme: 93 U/L — ABNORMAL HIGH (ref 14–82)

## 2019-01-10 LAB — B. BURGDORFI ANTIBODIES: Lyme IgG/IgM Ab: <0.91 {ISR} (ref 0.00–0.90)

## 2019-01-10 LAB — VITAMIN B12: Vitamin B-12: 891 pg/mL (ref 232–1245)

## 2019-01-10 LAB — ANA W/REFLEX: Anti Nuclear Antibody(ANA): NEGATIVE

## 2019-01-10 LAB — SEDIMENTATION RATE: Sed Rate: 28 mm/hr (ref 0–30)

## 2019-01-17 DIAGNOSIS — G4733 Obstructive sleep apnea (adult) (pediatric): Secondary | ICD-10-CM | POA: Diagnosis not present

## 2019-01-18 DIAGNOSIS — I10 Essential (primary) hypertension: Secondary | ICD-10-CM | POA: Diagnosis not present

## 2019-01-18 DIAGNOSIS — Z6841 Body Mass Index (BMI) 40.0 and over, adult: Secondary | ICD-10-CM | POA: Diagnosis not present

## 2019-01-18 DIAGNOSIS — G4733 Obstructive sleep apnea (adult) (pediatric): Secondary | ICD-10-CM | POA: Diagnosis not present

## 2019-01-18 DIAGNOSIS — J310 Chronic rhinitis: Secondary | ICD-10-CM | POA: Diagnosis not present

## 2019-01-18 DIAGNOSIS — R0602 Shortness of breath: Secondary | ICD-10-CM | POA: Diagnosis not present

## 2019-01-29 DIAGNOSIS — Z1389 Encounter for screening for other disorder: Secondary | ICD-10-CM | POA: Diagnosis not present

## 2019-01-29 DIAGNOSIS — R202 Paresthesia of skin: Secondary | ICD-10-CM | POA: Diagnosis not present

## 2019-01-29 DIAGNOSIS — E0840 Diabetes mellitus due to underlying condition with diabetic neuropathy, unspecified: Secondary | ICD-10-CM | POA: Diagnosis not present

## 2019-01-29 DIAGNOSIS — I509 Heart failure, unspecified: Secondary | ICD-10-CM | POA: Diagnosis not present

## 2019-01-29 DIAGNOSIS — G4733 Obstructive sleep apnea (adult) (pediatric): Secondary | ICD-10-CM | POA: Diagnosis not present

## 2019-01-29 DIAGNOSIS — Z0000 Encounter for general adult medical examination without abnormal findings: Secondary | ICD-10-CM | POA: Diagnosis not present

## 2019-01-29 DIAGNOSIS — E291 Testicular hypofunction: Secondary | ICD-10-CM | POA: Diagnosis not present

## 2019-01-29 DIAGNOSIS — E559 Vitamin D deficiency, unspecified: Secondary | ICD-10-CM | POA: Diagnosis not present

## 2019-01-29 DIAGNOSIS — E1165 Type 2 diabetes mellitus with hyperglycemia: Secondary | ICD-10-CM | POA: Diagnosis not present

## 2019-01-29 DIAGNOSIS — F322 Major depressive disorder, single episode, severe without psychotic features: Secondary | ICD-10-CM | POA: Diagnosis not present

## 2019-01-29 DIAGNOSIS — R209 Unspecified disturbances of skin sensation: Secondary | ICD-10-CM | POA: Diagnosis not present

## 2019-01-29 DIAGNOSIS — I10 Essential (primary) hypertension: Secondary | ICD-10-CM | POA: Diagnosis not present

## 2019-01-29 DIAGNOSIS — I4891 Unspecified atrial fibrillation: Secondary | ICD-10-CM | POA: Diagnosis not present

## 2019-02-06 DIAGNOSIS — I428 Other cardiomyopathies: Secondary | ICD-10-CM | POA: Diagnosis not present

## 2019-02-06 DIAGNOSIS — I4891 Unspecified atrial fibrillation: Secondary | ICD-10-CM | POA: Diagnosis not present

## 2019-02-06 DIAGNOSIS — R9431 Abnormal electrocardiogram [ECG] [EKG]: Secondary | ICD-10-CM | POA: Diagnosis not present

## 2019-02-06 DIAGNOSIS — G4733 Obstructive sleep apnea (adult) (pediatric): Secondary | ICD-10-CM | POA: Diagnosis not present

## 2019-02-06 DIAGNOSIS — I4510 Unspecified right bundle-branch block: Secondary | ICD-10-CM | POA: Diagnosis not present

## 2019-02-06 DIAGNOSIS — I10 Essential (primary) hypertension: Secondary | ICD-10-CM | POA: Diagnosis not present

## 2019-02-06 DIAGNOSIS — Z9989 Dependence on other enabling machines and devices: Secondary | ICD-10-CM | POA: Diagnosis not present

## 2019-02-06 DIAGNOSIS — Z8673 Personal history of transient ischemic attack (TIA), and cerebral infarction without residual deficits: Secondary | ICD-10-CM | POA: Diagnosis not present

## 2019-02-06 DIAGNOSIS — I482 Chronic atrial fibrillation, unspecified: Secondary | ICD-10-CM | POA: Diagnosis not present

## 2019-03-30 DIAGNOSIS — G4733 Obstructive sleep apnea (adult) (pediatric): Secondary | ICD-10-CM | POA: Diagnosis not present

## 2019-03-30 DIAGNOSIS — Z6839 Body mass index (BMI) 39.0-39.9, adult: Secondary | ICD-10-CM | POA: Diagnosis not present

## 2019-03-30 DIAGNOSIS — E785 Hyperlipidemia, unspecified: Secondary | ICD-10-CM | POA: Diagnosis not present

## 2019-03-30 DIAGNOSIS — I482 Chronic atrial fibrillation, unspecified: Secondary | ICD-10-CM | POA: Diagnosis not present

## 2019-03-30 DIAGNOSIS — E119 Type 2 diabetes mellitus without complications: Secondary | ICD-10-CM | POA: Diagnosis not present

## 2019-03-30 DIAGNOSIS — E669 Obesity, unspecified: Secondary | ICD-10-CM | POA: Diagnosis not present

## 2019-03-30 DIAGNOSIS — Z8673 Personal history of transient ischemic attack (TIA), and cerebral infarction without residual deficits: Secondary | ICD-10-CM | POA: Diagnosis not present

## 2019-03-30 DIAGNOSIS — Z9989 Dependence on other enabling machines and devices: Secondary | ICD-10-CM | POA: Diagnosis not present

## 2019-03-30 DIAGNOSIS — I10 Essential (primary) hypertension: Secondary | ICD-10-CM | POA: Diagnosis not present

## 2019-03-30 DIAGNOSIS — I428 Other cardiomyopathies: Secondary | ICD-10-CM | POA: Diagnosis not present

## 2019-04-04 DIAGNOSIS — E559 Vitamin D deficiency, unspecified: Secondary | ICD-10-CM | POA: Diagnosis not present

## 2019-04-04 DIAGNOSIS — E291 Testicular hypofunction: Secondary | ICD-10-CM | POA: Diagnosis not present

## 2019-04-04 DIAGNOSIS — I4891 Unspecified atrial fibrillation: Secondary | ICD-10-CM | POA: Diagnosis not present

## 2019-04-04 DIAGNOSIS — G4733 Obstructive sleep apnea (adult) (pediatric): Secondary | ICD-10-CM | POA: Diagnosis not present

## 2019-04-04 DIAGNOSIS — R202 Paresthesia of skin: Secondary | ICD-10-CM | POA: Diagnosis not present

## 2019-04-04 DIAGNOSIS — Z6841 Body Mass Index (BMI) 40.0 and over, adult: Secondary | ICD-10-CM | POA: Diagnosis not present

## 2019-04-04 DIAGNOSIS — I509 Heart failure, unspecified: Secondary | ICD-10-CM | POA: Diagnosis not present

## 2019-04-04 DIAGNOSIS — G5630 Lesion of radial nerve, unspecified upper limb: Secondary | ICD-10-CM | POA: Diagnosis not present

## 2019-04-04 DIAGNOSIS — E785 Hyperlipidemia, unspecified: Secondary | ICD-10-CM | POA: Diagnosis not present

## 2019-04-04 DIAGNOSIS — E0840 Diabetes mellitus due to underlying condition with diabetic neuropathy, unspecified: Secondary | ICD-10-CM | POA: Diagnosis not present

## 2019-04-04 DIAGNOSIS — I10 Essential (primary) hypertension: Secondary | ICD-10-CM | POA: Diagnosis not present

## 2019-04-04 DIAGNOSIS — R209 Unspecified disturbances of skin sensation: Secondary | ICD-10-CM | POA: Diagnosis not present

## 2019-05-04 DIAGNOSIS — H40003 Preglaucoma, unspecified, bilateral: Secondary | ICD-10-CM | POA: Diagnosis not present

## 2019-05-04 DIAGNOSIS — H04123 Dry eye syndrome of bilateral lacrimal glands: Secondary | ICD-10-CM | POA: Diagnosis not present

## 2019-06-05 DIAGNOSIS — L918 Other hypertrophic disorders of the skin: Secondary | ICD-10-CM | POA: Diagnosis not present

## 2019-06-05 DIAGNOSIS — I8310 Varicose veins of unspecified lower extremity with inflammation: Secondary | ICD-10-CM | POA: Diagnosis not present

## 2019-06-11 DIAGNOSIS — I10 Essential (primary) hypertension: Secondary | ICD-10-CM | POA: Diagnosis not present

## 2019-06-11 DIAGNOSIS — G4733 Obstructive sleep apnea (adult) (pediatric): Secondary | ICD-10-CM | POA: Diagnosis not present

## 2019-06-11 DIAGNOSIS — R6889 Other general symptoms and signs: Secondary | ICD-10-CM | POA: Diagnosis not present

## 2019-06-11 DIAGNOSIS — I517 Cardiomegaly: Secondary | ICD-10-CM | POA: Diagnosis not present

## 2019-06-11 DIAGNOSIS — R0600 Dyspnea, unspecified: Secondary | ICD-10-CM | POA: Diagnosis not present

## 2019-06-11 DIAGNOSIS — R0602 Shortness of breath: Secondary | ICD-10-CM | POA: Diagnosis not present

## 2019-06-11 DIAGNOSIS — Z6839 Body mass index (BMI) 39.0-39.9, adult: Secondary | ICD-10-CM | POA: Diagnosis not present

## 2019-06-12 DIAGNOSIS — R9431 Abnormal electrocardiogram [ECG] [EKG]: Secondary | ICD-10-CM | POA: Diagnosis not present

## 2019-06-12 DIAGNOSIS — Z9989 Dependence on other enabling machines and devices: Secondary | ICD-10-CM | POA: Diagnosis not present

## 2019-06-12 DIAGNOSIS — I5022 Chronic systolic (congestive) heart failure: Secondary | ICD-10-CM | POA: Diagnosis not present

## 2019-06-12 DIAGNOSIS — I482 Chronic atrial fibrillation, unspecified: Secondary | ICD-10-CM | POA: Diagnosis not present

## 2019-06-12 DIAGNOSIS — I428 Other cardiomyopathies: Secondary | ICD-10-CM | POA: Diagnosis not present

## 2019-06-12 DIAGNOSIS — I493 Ventricular premature depolarization: Secondary | ICD-10-CM | POA: Diagnosis not present

## 2019-06-12 DIAGNOSIS — I4891 Unspecified atrial fibrillation: Secondary | ICD-10-CM | POA: Diagnosis not present

## 2019-06-12 DIAGNOSIS — I10 Essential (primary) hypertension: Secondary | ICD-10-CM | POA: Diagnosis not present

## 2019-06-12 DIAGNOSIS — G4733 Obstructive sleep apnea (adult) (pediatric): Secondary | ICD-10-CM | POA: Diagnosis not present

## 2019-06-12 DIAGNOSIS — I4510 Unspecified right bundle-branch block: Secondary | ICD-10-CM | POA: Diagnosis not present

## 2019-06-12 DIAGNOSIS — Z8673 Personal history of transient ischemic attack (TIA), and cerebral infarction without residual deficits: Secondary | ICD-10-CM | POA: Diagnosis not present

## 2019-06-13 DIAGNOSIS — I428 Other cardiomyopathies: Secondary | ICD-10-CM | POA: Diagnosis not present

## 2019-06-13 DIAGNOSIS — I482 Chronic atrial fibrillation, unspecified: Secondary | ICD-10-CM | POA: Diagnosis not present

## 2019-06-13 DIAGNOSIS — I10 Essential (primary) hypertension: Secondary | ICD-10-CM | POA: Diagnosis not present

## 2019-06-13 DIAGNOSIS — I5022 Chronic systolic (congestive) heart failure: Secondary | ICD-10-CM | POA: Diagnosis not present

## 2019-06-18 ENCOUNTER — Other Ambulatory Visit (HOSPITAL_COMMUNITY): Payer: Self-pay

## 2019-06-18 DIAGNOSIS — R1314 Dysphagia, pharyngoesophageal phase: Secondary | ICD-10-CM | POA: Diagnosis not present

## 2019-06-18 DIAGNOSIS — Z7901 Long term (current) use of anticoagulants: Secondary | ICD-10-CM | POA: Diagnosis not present

## 2019-06-18 DIAGNOSIS — I4891 Unspecified atrial fibrillation: Secondary | ICD-10-CM | POA: Diagnosis not present

## 2019-06-18 DIAGNOSIS — Z8673 Personal history of transient ischemic attack (TIA), and cerebral infarction without residual deficits: Secondary | ICD-10-CM | POA: Diagnosis not present

## 2019-06-18 DIAGNOSIS — R1310 Dysphagia, unspecified: Secondary | ICD-10-CM

## 2019-06-27 ENCOUNTER — Ambulatory Visit (HOSPITAL_COMMUNITY)
Admission: RE | Admit: 2019-06-27 | Discharge: 2019-06-27 | Disposition: A | Discharge status: Home / Self Care | Payer: Medicare Other | Source: Ambulatory Visit | Attending: Physician Assistant | Admitting: Physician Assistant

## 2019-06-27 ENCOUNTER — Other Ambulatory Visit: Payer: Self-pay

## 2019-06-27 DIAGNOSIS — R1310 Dysphagia, unspecified: Secondary | ICD-10-CM | POA: Insufficient documentation

## 2019-06-27 DIAGNOSIS — R1314 Dysphagia, pharyngoesophageal phase: Secondary | ICD-10-CM | POA: Insufficient documentation

## 2019-06-27 NOTE — Progress Notes (Signed)
Modified Barium Swallow Progress Note  Patient Details  Name: Willie Pineda MRN: [id number] Date of Birth: Feb 12, 1946  Today's Date: 06/27/2019  Modified Barium Swallow completed.  Full report located under Chart Review in the Imaging Section.  Brief recommendations include the following:  Clinical Impression  Pt demonstrates normal oral and oropharyngeal swallow function. Once instance of trace penetration, ejected with further swallows. No aspiration, no complaint of globus during test. Suggest f/u esophageal assessment given pts complaints.    Swallow Evaluation Recommendations   Recommended Consults: Consider GI evaluation;Consider esophageal assessment   SLP Diet Recommendations: Regular solids;Thin liquid   Liquid Administration via: Cup;Straw       Supervision: Patient able to self feed   Compensations: Slow rate;Small sips/bites   Postural Changes: Remain semi-upright after after feeds/meals (Comment)   Oral Care Recommendations: Oral care BID       Herbie Baltimore, MA CCC-SLP  Acute Rehabilitation Services Pager [phone] Office [phone]  Lubertha Leite, Katherene Ponto 06/27/2019,1:22 PM

## 2019-07-02 DIAGNOSIS — I4891 Unspecified atrial fibrillation: Secondary | ICD-10-CM | POA: Diagnosis not present

## 2019-07-02 DIAGNOSIS — I428 Other cardiomyopathies: Secondary | ICD-10-CM | POA: Diagnosis not present

## 2019-07-02 DIAGNOSIS — Z8673 Personal history of transient ischemic attack (TIA), and cerebral infarction without residual deficits: Secondary | ICD-10-CM | POA: Diagnosis not present

## 2019-07-02 DIAGNOSIS — I5022 Chronic systolic (congestive) heart failure: Secondary | ICD-10-CM | POA: Diagnosis not present

## 2019-07-02 DIAGNOSIS — Z9989 Dependence on other enabling machines and devices: Secondary | ICD-10-CM | POA: Diagnosis not present

## 2019-07-02 DIAGNOSIS — I10 Essential (primary) hypertension: Secondary | ICD-10-CM | POA: Diagnosis not present

## 2019-07-02 DIAGNOSIS — G4733 Obstructive sleep apnea (adult) (pediatric): Secondary | ICD-10-CM | POA: Diagnosis not present

## 2019-07-02 DIAGNOSIS — I482 Chronic atrial fibrillation, unspecified: Secondary | ICD-10-CM | POA: Diagnosis not present

## 2019-07-10 DIAGNOSIS — S81802A Unspecified open wound, left lower leg, initial encounter: Secondary | ICD-10-CM | POA: Diagnosis not present

## 2019-07-10 DIAGNOSIS — I428 Other cardiomyopathies: Secondary | ICD-10-CM | POA: Diagnosis not present

## 2019-07-10 DIAGNOSIS — I8310 Varicose veins of unspecified lower extremity with inflammation: Secondary | ICD-10-CM | POA: Diagnosis not present

## 2019-07-10 DIAGNOSIS — I10 Essential (primary) hypertension: Secondary | ICD-10-CM | POA: Diagnosis not present

## 2019-07-10 DIAGNOSIS — Z9989 Dependence on other enabling machines and devices: Secondary | ICD-10-CM | POA: Diagnosis not present

## 2019-07-10 DIAGNOSIS — I5022 Chronic systolic (congestive) heart failure: Secondary | ICD-10-CM | POA: Diagnosis not present

## 2019-07-10 DIAGNOSIS — I482 Chronic atrial fibrillation, unspecified: Secondary | ICD-10-CM | POA: Diagnosis not present

## 2019-07-10 DIAGNOSIS — G4733 Obstructive sleep apnea (adult) (pediatric): Secondary | ICD-10-CM | POA: Diagnosis not present

## 2019-07-10 DIAGNOSIS — Z8673 Personal history of transient ischemic attack (TIA), and cerebral infarction without residual deficits: Secondary | ICD-10-CM | POA: Diagnosis not present

## 2019-07-11 ENCOUNTER — Other Ambulatory Visit: Payer: Self-pay | Admitting: Physician Assistant

## 2019-07-11 DIAGNOSIS — R1314 Dysphagia, pharyngoesophageal phase: Secondary | ICD-10-CM

## 2019-07-17 ENCOUNTER — Ambulatory Visit
Admission: RE | Admit: 2019-07-17 | Discharge: 2019-07-17 | Disposition: A | Discharge status: Home / Self Care | Payer: Medicare Other | Source: Ambulatory Visit | Attending: Physician Assistant | Admitting: Physician Assistant

## 2019-07-17 DIAGNOSIS — R1313 Dysphagia, pharyngeal phase: Secondary | ICD-10-CM | POA: Diagnosis not present

## 2019-07-17 DIAGNOSIS — R1314 Dysphagia, pharyngoesophageal phase: Secondary | ICD-10-CM

## 2019-07-20 DIAGNOSIS — K219 Gastro-esophageal reflux disease without esophagitis: Secondary | ICD-10-CM | POA: Diagnosis not present

## 2019-07-20 DIAGNOSIS — R1310 Dysphagia, unspecified: Secondary | ICD-10-CM | POA: Diagnosis not present

## 2019-07-20 DIAGNOSIS — Z86010 Personal history of colonic polyps: Secondary | ICD-10-CM | POA: Diagnosis not present

## 2019-07-31 DIAGNOSIS — I10 Essential (primary) hypertension: Secondary | ICD-10-CM | POA: Diagnosis not present

## 2019-07-31 DIAGNOSIS — Z8673 Personal history of transient ischemic attack (TIA), and cerebral infarction without residual deficits: Secondary | ICD-10-CM | POA: Diagnosis not present

## 2019-07-31 DIAGNOSIS — I482 Chronic atrial fibrillation, unspecified: Secondary | ICD-10-CM | POA: Diagnosis not present

## 2019-07-31 DIAGNOSIS — I428 Other cardiomyopathies: Secondary | ICD-10-CM | POA: Diagnosis not present

## 2019-07-31 DIAGNOSIS — Z9989 Dependence on other enabling machines and devices: Secondary | ICD-10-CM | POA: Diagnosis not present

## 2019-07-31 DIAGNOSIS — G4733 Obstructive sleep apnea (adult) (pediatric): Secondary | ICD-10-CM | POA: Diagnosis not present

## 2019-07-31 DIAGNOSIS — I5022 Chronic systolic (congestive) heart failure: Secondary | ICD-10-CM | POA: Diagnosis not present

## 2019-08-01 DIAGNOSIS — E0840 Diabetes mellitus due to underlying condition with diabetic neuropathy, unspecified: Secondary | ICD-10-CM | POA: Diagnosis not present

## 2019-08-01 DIAGNOSIS — E1165 Type 2 diabetes mellitus with hyperglycemia: Secondary | ICD-10-CM | POA: Diagnosis not present

## 2019-08-01 DIAGNOSIS — Z23 Encounter for immunization: Secondary | ICD-10-CM | POA: Diagnosis not present

## 2019-08-01 DIAGNOSIS — E785 Hyperlipidemia, unspecified: Secondary | ICD-10-CM | POA: Diagnosis not present

## 2019-08-01 DIAGNOSIS — E291 Testicular hypofunction: Secondary | ICD-10-CM | POA: Diagnosis not present

## 2019-08-02 DIAGNOSIS — E1165 Type 2 diabetes mellitus with hyperglycemia: Secondary | ICD-10-CM | POA: Diagnosis not present

## 2019-08-02 DIAGNOSIS — E1140 Type 2 diabetes mellitus with diabetic neuropathy, unspecified: Secondary | ICD-10-CM | POA: Diagnosis not present

## 2019-08-03 DIAGNOSIS — H1033 Unspecified acute conjunctivitis, bilateral: Secondary | ICD-10-CM | POA: Diagnosis not present

## 2019-08-30 DIAGNOSIS — H1033 Unspecified acute conjunctivitis, bilateral: Secondary | ICD-10-CM | POA: Diagnosis not present

## 2019-10-02 DIAGNOSIS — J449 Chronic obstructive pulmonary disease, unspecified: Secondary | ICD-10-CM | POA: Diagnosis not present

## 2019-10-02 DIAGNOSIS — E785 Hyperlipidemia, unspecified: Secondary | ICD-10-CM | POA: Diagnosis not present

## 2019-10-02 DIAGNOSIS — E781 Pure hyperglyceridemia: Secondary | ICD-10-CM | POA: Diagnosis not present

## 2019-10-02 DIAGNOSIS — E0840 Diabetes mellitus due to underlying condition with diabetic neuropathy, unspecified: Secondary | ICD-10-CM | POA: Diagnosis not present

## 2019-10-02 DIAGNOSIS — I4891 Unspecified atrial fibrillation: Secondary | ICD-10-CM | POA: Diagnosis not present

## 2019-10-02 DIAGNOSIS — I509 Heart failure, unspecified: Secondary | ICD-10-CM | POA: Diagnosis not present

## 2019-10-02 DIAGNOSIS — E1165 Type 2 diabetes mellitus with hyperglycemia: Secondary | ICD-10-CM | POA: Diagnosis not present

## 2019-10-02 DIAGNOSIS — F322 Major depressive disorder, single episode, severe without psychotic features: Secondary | ICD-10-CM | POA: Diagnosis not present

## 2019-10-02 DIAGNOSIS — I10 Essential (primary) hypertension: Secondary | ICD-10-CM | POA: Diagnosis not present

## 2019-10-30 DIAGNOSIS — I10 Essential (primary) hypertension: Secondary | ICD-10-CM | POA: Diagnosis not present

## 2019-10-30 DIAGNOSIS — E0840 Diabetes mellitus due to underlying condition with diabetic neuropathy, unspecified: Secondary | ICD-10-CM | POA: Diagnosis not present

## 2019-10-30 DIAGNOSIS — H0100B Unspecified blepharitis left eye, upper and lower eyelids: Secondary | ICD-10-CM | POA: Diagnosis not present

## 2019-10-30 DIAGNOSIS — H1033 Unspecified acute conjunctivitis, bilateral: Secondary | ICD-10-CM | POA: Diagnosis not present

## 2019-10-30 DIAGNOSIS — H0100A Unspecified blepharitis right eye, upper and lower eyelids: Secondary | ICD-10-CM | POA: Diagnosis not present

## 2019-10-30 DIAGNOSIS — H04123 Dry eye syndrome of bilateral lacrimal glands: Secondary | ICD-10-CM | POA: Diagnosis not present

## 2019-10-30 DIAGNOSIS — E781 Pure hyperglyceridemia: Secondary | ICD-10-CM | POA: Diagnosis not present

## 2019-10-30 DIAGNOSIS — I4891 Unspecified atrial fibrillation: Secondary | ICD-10-CM | POA: Diagnosis not present

## 2019-10-30 DIAGNOSIS — J449 Chronic obstructive pulmonary disease, unspecified: Secondary | ICD-10-CM | POA: Diagnosis not present

## 2019-10-30 DIAGNOSIS — I509 Heart failure, unspecified: Secondary | ICD-10-CM | POA: Diagnosis not present

## 2019-10-30 DIAGNOSIS — E1165 Type 2 diabetes mellitus with hyperglycemia: Secondary | ICD-10-CM | POA: Diagnosis not present

## 2019-10-30 DIAGNOSIS — E785 Hyperlipidemia, unspecified: Secondary | ICD-10-CM | POA: Diagnosis not present

## 2019-10-30 DIAGNOSIS — F322 Major depressive disorder, single episode, severe without psychotic features: Secondary | ICD-10-CM | POA: Diagnosis not present

## 2019-11-05 DIAGNOSIS — H40003 Preglaucoma, unspecified, bilateral: Secondary | ICD-10-CM | POA: Diagnosis not present

## 2019-11-05 DIAGNOSIS — H26493 Other secondary cataract, bilateral: Secondary | ICD-10-CM | POA: Diagnosis not present

## 2019-11-05 DIAGNOSIS — H5010 Unspecified exotropia: Secondary | ICD-10-CM | POA: Diagnosis not present

## 2019-11-05 DIAGNOSIS — H5089 Other specified strabismus: Secondary | ICD-10-CM | POA: Diagnosis not present

## 2019-11-05 DIAGNOSIS — H1033 Unspecified acute conjunctivitis, bilateral: Secondary | ICD-10-CM | POA: Diagnosis not present

## 2019-11-05 DIAGNOSIS — H527 Unspecified disorder of refraction: Secondary | ICD-10-CM | POA: Diagnosis not present

## 2019-11-08 DIAGNOSIS — E1140 Type 2 diabetes mellitus with diabetic neuropathy, unspecified: Secondary | ICD-10-CM | POA: Diagnosis not present

## 2019-11-08 DIAGNOSIS — I10 Essential (primary) hypertension: Secondary | ICD-10-CM | POA: Diagnosis not present

## 2019-11-08 DIAGNOSIS — E1165 Type 2 diabetes mellitus with hyperglycemia: Secondary | ICD-10-CM | POA: Diagnosis not present

## 2020-01-01 ENCOUNTER — Ambulatory Visit: Payer: Medicare Other

## 2020-01-10 ENCOUNTER — Ambulatory Visit: Payer: Medicare Other | Attending: Internal Medicine

## 2020-01-10 DIAGNOSIS — Z23 Encounter for immunization: Secondary | ICD-10-CM | POA: Insufficient documentation

## 2020-01-10 NOTE — Progress Notes (Signed)
   Covid-19 Vaccination Clinic  Name:  Willie Pineda    MRN: [id number] DOB: February 20, 1946  01/10/2020  Mr. Willie Pineda was observed post Covid-19 immunization for 15 minutes without incidence. He was provided with Vaccine Information Sheet and instruction to access the V-Safe system.   Mr. Willie Pineda was instructed to call 911 with any severe reactions post vaccine: Marland Kitchen Difficulty breathing  . Swelling of your face and throat  . A fast heartbeat  . A bad rash all over your body  . Dizziness and weakness    Immunizations Administered    Name Date Dose VIS Date Route   Pfizer COVID-19 Vaccine 01/10/2020  8:20 AM 0.3 mL 11/16/2019 Intramuscular   Manufacturer: [place]   Lot: [id number]   [place]: [id number]

## 2020-02-04 ENCOUNTER — Ambulatory Visit: Payer: Medicare Other | Attending: Internal Medicine

## 2020-02-04 DIAGNOSIS — Z23 Encounter for immunization: Secondary | ICD-10-CM | POA: Insufficient documentation

## 2020-02-04 NOTE — Progress Notes (Signed)
   Covid-19 Vaccination Clinic  Name:  TOUA DROGE    MRN: [id number] DOB: 23-Dec-1945  02/04/2020  Mr. Closs was observed post Covid-19 immunization for 30 minutes based on pre-vaccination screening without incidence. He was provided with Vaccine Information Sheet and instruction to access the V-Safe system.   Mr. Mullikin was instructed to call 911 with any severe reactions post vaccine: Marland Kitchen Difficulty breathing  . Swelling of your face and throat  . A fast heartbeat  . A bad rash all over your body  . Dizziness and weakness    Immunizations Administered    Name Date Dose VIS Date Route   Pfizer COVID-19 Vaccine 02/04/2020 12:23 PM 0.3 mL 11/16/2019 Intramuscular   Manufacturer: [place]   Lot: [id number]   [place]: [id number]

## 2020-03-13 ENCOUNTER — Other Ambulatory Visit: Payer: Self-pay | Admitting: Gastroenterology

## 2020-03-31 ENCOUNTER — Other Ambulatory Visit (HOSPITAL_COMMUNITY)
Admission: RE | Admit: 2020-03-31 | Discharge: 2020-03-31 | Disposition: A | Discharge status: Home / Self Care | Payer: Medicare Other | Source: Ambulatory Visit | Attending: Gastroenterology | Admitting: Gastroenterology

## 2020-03-31 DIAGNOSIS — Z20822 Contact with and (suspected) exposure to covid-19: Secondary | ICD-10-CM | POA: Insufficient documentation

## 2020-03-31 DIAGNOSIS — Z01812 Encounter for preprocedural laboratory examination: Secondary | ICD-10-CM | POA: Insufficient documentation

## 2020-03-31 LAB — SARS CORONAVIRUS 2 (TAT 6-24 HRS): SARS Coronavirus 2: NEGATIVE

## 2020-04-03 ENCOUNTER — Ambulatory Visit (HOSPITAL_COMMUNITY): Payer: Medicare Other | Admitting: Anesthesiology

## 2020-04-03 ENCOUNTER — Encounter (HOSPITAL_COMMUNITY)
Admission: RE | Disposition: A | Discharge status: Home / Self Care | Payer: Self-pay | Source: Home / Self Care | Attending: Gastroenterology

## 2020-04-03 ENCOUNTER — Ambulatory Visit (HOSPITAL_COMMUNITY)
Admission: RE | Admit: 2020-04-03 | Discharge: 2020-04-03 | Disposition: A | Discharge status: Home / Self Care | Payer: Medicare Other | Attending: Gastroenterology | Admitting: Gastroenterology

## 2020-04-03 ENCOUNTER — Other Ambulatory Visit: Payer: Self-pay

## 2020-04-03 ENCOUNTER — Encounter (HOSPITAL_COMMUNITY): Payer: Self-pay | Admitting: Gastroenterology

## 2020-04-03 DIAGNOSIS — Z8673 Personal history of transient ischemic attack (TIA), and cerebral infarction without residual deficits: Secondary | ICD-10-CM | POA: Insufficient documentation

## 2020-04-03 DIAGNOSIS — E1140 Type 2 diabetes mellitus with diabetic neuropathy, unspecified: Secondary | ICD-10-CM | POA: Diagnosis not present

## 2020-04-03 DIAGNOSIS — I4891 Unspecified atrial fibrillation: Secondary | ICD-10-CM | POA: Diagnosis not present

## 2020-04-03 DIAGNOSIS — Z800 Family history of malignant neoplasm of digestive organs: Secondary | ICD-10-CM | POA: Insufficient documentation

## 2020-04-03 DIAGNOSIS — I509 Heart failure, unspecified: Secondary | ICD-10-CM | POA: Diagnosis not present

## 2020-04-03 DIAGNOSIS — Z880 Allergy status to penicillin: Secondary | ICD-10-CM | POA: Insufficient documentation

## 2020-04-03 DIAGNOSIS — Z833 Family history of diabetes mellitus: Secondary | ICD-10-CM | POA: Diagnosis not present

## 2020-04-03 DIAGNOSIS — Z8249 Family history of ischemic heart disease and other diseases of the circulatory system: Secondary | ICD-10-CM | POA: Diagnosis not present

## 2020-04-03 DIAGNOSIS — I255 Ischemic cardiomyopathy: Secondary | ICD-10-CM | POA: Insufficient documentation

## 2020-04-03 DIAGNOSIS — Z9049 Acquired absence of other specified parts of digestive tract: Secondary | ICD-10-CM | POA: Insufficient documentation

## 2020-04-03 DIAGNOSIS — Z86010 Personal history of colonic polyps: Secondary | ICD-10-CM | POA: Diagnosis not present

## 2020-04-03 DIAGNOSIS — H5010 Unspecified exotropia: Secondary | ICD-10-CM | POA: Insufficient documentation

## 2020-04-03 DIAGNOSIS — D123 Benign neoplasm of transverse colon: Secondary | ICD-10-CM | POA: Diagnosis not present

## 2020-04-03 DIAGNOSIS — I110 Hypertensive heart disease with heart failure: Secondary | ICD-10-CM | POA: Insufficient documentation

## 2020-04-03 DIAGNOSIS — Z1211 Encounter for screening for malignant neoplasm of colon: Secondary | ICD-10-CM | POA: Diagnosis not present

## 2020-04-03 DIAGNOSIS — Z8261 Family history of arthritis: Secondary | ICD-10-CM | POA: Diagnosis not present

## 2020-04-03 DIAGNOSIS — K635 Polyp of colon: Secondary | ICD-10-CM | POA: Insufficient documentation

## 2020-04-03 DIAGNOSIS — M1990 Unspecified osteoarthritis, unspecified site: Secondary | ICD-10-CM | POA: Insufficient documentation

## 2020-04-03 DIAGNOSIS — D124 Benign neoplasm of descending colon: Secondary | ICD-10-CM | POA: Diagnosis not present

## 2020-04-03 DIAGNOSIS — R0602 Shortness of breath: Secondary | ICD-10-CM | POA: Insufficient documentation

## 2020-04-03 DIAGNOSIS — F17290 Nicotine dependence, other tobacco product, uncomplicated: Secondary | ICD-10-CM | POA: Insufficient documentation

## 2020-04-03 DIAGNOSIS — Z885 Allergy status to narcotic agent status: Secondary | ICD-10-CM | POA: Insufficient documentation

## 2020-04-03 DIAGNOSIS — K219 Gastro-esophageal reflux disease without esophagitis: Secondary | ICD-10-CM | POA: Insufficient documentation

## 2020-04-03 DIAGNOSIS — Z6841 Body Mass Index (BMI) 40.0 and over, adult: Secondary | ICD-10-CM | POA: Insufficient documentation

## 2020-04-03 DIAGNOSIS — K621 Rectal polyp: Secondary | ICD-10-CM | POA: Diagnosis not present

## 2020-04-03 DIAGNOSIS — Z888 Allergy status to other drugs, medicaments and biological substances status: Secondary | ICD-10-CM | POA: Insufficient documentation

## 2020-04-03 DIAGNOSIS — G4730 Sleep apnea, unspecified: Secondary | ICD-10-CM | POA: Insufficient documentation

## 2020-04-03 DIAGNOSIS — Z823 Family history of stroke: Secondary | ICD-10-CM | POA: Diagnosis not present

## 2020-04-03 DIAGNOSIS — Z96653 Presence of artificial knee joint, bilateral: Secondary | ICD-10-CM | POA: Diagnosis not present

## 2020-04-03 DIAGNOSIS — E669 Obesity, unspecified: Secondary | ICD-10-CM | POA: Insufficient documentation

## 2020-04-03 HISTORY — PX: POLYPECTOMY: SHX5525

## 2020-04-03 HISTORY — PX: COLONOSCOPY WITH PROPOFOL: SHX5780

## 2020-04-03 LAB — GLUCOSE, CAPILLARY: Glucose-Capillary: 151 mg/dL — ABNORMAL HIGH (ref 70–99)

## 2020-04-03 SURGERY — COLONOSCOPY WITH PROPOFOL
Anesthesia: Monitor Anesthesia Care

## 2020-04-03 MED ORDER — PROPOFOL 500 MG/50ML IV EMUL
INTRAVENOUS | Status: DC | PRN
  Administered 2020-04-03: 125 ug/kg/min via INTRAVENOUS

## 2020-04-03 MED ORDER — ESMOLOL HCL 100 MG/10ML IV SOLN
INTRAVENOUS | Status: DC | PRN
  Administered 2020-04-03 (×2): 20 mg via INTRAVENOUS

## 2020-04-03 MED ORDER — PROPOFOL 500 MG/50ML IV EMUL
INTRAVENOUS | Status: DC | PRN
  Administered 2020-04-03: 150 mg via INTRAVENOUS
  Administered 2020-04-03: 50 mg via INTRAVENOUS

## 2020-04-03 MED ORDER — PROPOFOL 10 MG/ML IV BOLUS
INTRAVENOUS | Status: AC
  Filled 2020-04-03: qty 20

## 2020-04-03 MED ORDER — VASOPRESSIN 20 UNIT/ML IV SOLN
INTRAVENOUS | Status: AC
  Filled 2020-04-03: qty 1

## 2020-04-03 MED ORDER — PHENYLEPHRINE HCL (PRESSORS) 10 MG/ML IV SOLN
INTRAVENOUS | Status: DC | PRN
Start: 2020-04-03 — End: 2020-04-03
  Administered 2020-04-03: 120 ug via INTRAVENOUS
  Administered 2020-04-03 (×2): 80 ug via INTRAVENOUS
  Administered 2020-04-03: 120 ug via INTRAVENOUS
  Administered 2020-04-03: 80 ug via INTRAVENOUS
  Administered 2020-04-03: 120 ug via INTRAVENOUS
  Administered 2020-04-03: 80 ug via INTRAVENOUS

## 2020-04-03 MED ORDER — LACTATED RINGERS IV SOLN
INTRAVENOUS | Status: DC
  Administered 2020-04-03: 12:00:00 1000 mL via INTRAVENOUS

## 2020-04-03 MED ORDER — SODIUM CHLORIDE 0.9 % IV SOLN: INTRAVENOUS | Status: DC

## 2020-04-03 MED ORDER — EPHEDRINE SULFATE 50 MG/ML IJ SOLN
INTRAMUSCULAR | Status: DC | PRN
Start: 2020-04-03 — End: 2020-04-03
  Administered 2020-04-03 (×2): 10 mg via INTRAVENOUS
  Administered 2020-04-03: 5 mg via INTRAVENOUS

## 2020-04-03 MED ORDER — DEXMEDETOMIDINE HCL 200 MCG/2ML IV SOLN
INTRAVENOUS | Status: DC | PRN
  Administered 2020-04-03: 12 ug via INTRAVENOUS
  Administered 2020-04-03: 8 ug via INTRAVENOUS

## 2020-04-03 SURGICAL SUPPLY — 21 items

## 2020-04-03 NOTE — Op Note (Signed)
[facility] Patient Name: Willie Pineda Procedure Date: 04/03/2020 MRN: [id number] Attending MD: Wonda Horner , MD Date of Birth: 1946-05-12 CSN: [id number] Age: 74 Admit Type: Inpatient Procedure:                Colonoscopy Indications:              High risk colon cancer surveillance: Personal                            history of colonic polyps, Last colonoscopy 3 years                            ago Providers:                Wonda Horner, MD, Angus Seller, Elspeth Cho                            Tech., Technician, Rosario Adie, CRNA Referring MD:              Medicines:                Monitored Anesthesia Care Complications:            No immediate complications. Estimated Blood Loss:     Estimated blood loss: none. Procedure:                Pre-Anesthesia Assessment:                           - Prior to the procedure, a History and Physical                            was performed, and patient medications and                            allergies were reviewed. The patient's tolerance of                            previous anesthesia was also reviewed. The risks                            and benefits of the procedure and the sedation                            options and risks were discussed with the patient.                            All questions were answered, and informed consent                            was obtained. Prior Anticoagulants: The patient has                            taken Xarelto (rivaroxaban), last dose was 2 days  prior to procedure. ASA Grade Assessment: III - A                            patient with severe systemic disease. After                            reviewing the risks and benefits, the patient was                            deemed in satisfactory condition to undergo the                            procedure.                           After obtaining informed consent, the colonoscope                         was passed under direct vision. Throughout the                            procedure, the patient's blood pressure, pulse, and                            oxygen saturations were monitored continuously. The                            [id number] Olympus pediatric colonscope                            was introduced through the anus and advanced to the                            the cecum, identified by appendiceal orifice and                            ileocecal valve. The ileocecal valve and the rectum                            were photographed. The colonoscopy was performed                            without difficulty. The patient tolerated the                            procedure well. The quality of the bowel                            preparation was adequate. Scope In: 1:12:53 PM Scope Out: 1:38:07 PM Scope Withdrawal Time: 0 hours 19 minutes 37 seconds  Total Procedure Duration: 0 hours 25 minutes 14 seconds  Findings:      The perianal and digital rectal examinations were normal.      Three sessile polyps were found in the transverse colon. The polyps were  4 to 5 mm in size. These polyps were removed with a hot snare. Resection       and retrieval were complete.      A 5 mm polyp was found in the descending colon. The polyp was sessile.       The polyp was removed with a hot snare. Resection and retrieval were       complete.      Three sessile polyps were found in the sigmoid colon. The polyps were 4       to 5 mm in size. These polyps were removed with a hot snare. Resection       and retrieval were complete.      A 5 mm polyp was found in the rectum. The polyp was sessile. The polyp       was removed with a hot snare. Resection and retrieval were complete. Impression:               - Three 4 to 5 mm polyps in the transverse colon,                            removed with a hot snare. Resected and retrieved.                           - One  5 mm polyp in the descending colon, removed                            with a hot snare. Resected and retrieved.                           - Three 4 to 5 mm polyps in the sigmoid colon,                            removed with a hot snare. Resected and retrieved.                           - One 5 mm polyp in the rectum, removed with a hot                            snare. Resected and retrieved. Moderate Sedation:      . Recommendation:           - Resume regular diet.                           - Continue present medications.                           - Resume Xarelto (rivaroxaban) at prior dose                            tomorrow.                           - Repeat colonoscopy (date not yet determined) for  surveillance. Procedure Code(s):        --- Professional ---                           [phone], Colonoscopy, flexible; with removal of                            tumor(s), polyp(s), or other lesion(s) by snare                            technique Diagnosis Code(s):        --- Professional ---                           Z86.010, Personal history of colonic polyps                           K63.5, Polyp of colon                           K62.1, Rectal polyp CPT copyright 2019 American Medical Association. All rights reserved. The codes documented in this report are preliminary and upon coder review may  be revised to meet current compliance requirements. Wonda Horner, MD 04/03/2020 1:48:11 PM This report has been signed electronically. Number of Addenda: 0

## 2020-04-03 NOTE — H&P (Signed)
The patient is here in the endoscopy unit today for a colonoscopy   Physical  No distress  Heart regular rhythm  Lungs clear  Abdomen soft and nontender  Impression: History of colon polyps  Plan colonoscopy

## 2020-04-03 NOTE — Anesthesia Preprocedure Evaluation (Addendum)
Anesthesia Evaluation  Patient identified by MRN, date of birth, ID band Patient awake    Reviewed: Allergy & Precautions, NPO status , Patient's Chart, lab work & pertinent test results, reviewed documented beta blocker date and time   History of Anesthesia Complications (+) history of anesthetic complications  Airway Mallampati: I  TM Distance: >3 FB Neck ROM: Full    Dental no notable dental hx. (+) Teeth Intact, Dental Advisory Given, Caps   Pulmonary shortness of breath and with exertion, sleep apnea and Continuous Positive Airway Pressure Ventilation , pneumonia, resolved, neg COPD, former smoker,  OSA on bipap Former smoker, quit 2012, 20 pack year history- only smokes occasional cigars now,    Had issues with bronchospasm during anesthesia with one of his cscopes in past at a day surgery center- can't remember if it was before or after his 2018 cscope at [facility] (but had no issues during the 2018 cscope)   Pulmonary exam normal breath sounds clear to auscultation       Cardiovascular hypertension, Pt. on medications and Pt. on home beta blockers +CHF  Normal cardiovascular exam+ dysrhythmias Atrial Fibrillation  Rhythm:Irregular Rate:Normal     Neuro/Psych PSYCHIATRIC DISORDERS Depression Mild difficulty with swallowing after stroke- no issues now. 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  Neuromuscular disease CVA, Residual Symptoms    GI/Hepatic Neg liver ROS, GERD  Medicated and Controlled,  Endo/Other  diabetes, Type 2Hyperlipidemia Borderline T2DM- no meds  Renal/GU negative Renal ROS  negative genitourinary   Musculoskeletal  (+) Arthritis , Osteoarthritis,  Bilateral exotropia   Abdominal (+) + obese,   Peds  Hematology negative hematology ROS (+) On Xarelto- was told to continue medication through day of surgery   Anesthesia Other Findings   Reproductive/Obstetrics negative OB ROS                            Anesthesia Physical Anesthesia Plan  ASA: III  Anesthesia Plan: MAC   Post-op Pain Management:    Induction:   PONV Risk Score and Plan: 2 and Propofol infusion and TIVA  Airway Management Planned: Natural Airway and Simple Face Mask  Additional Equipment: None  Intra-op Plan:   Post-operative Plan:   Informed Consent: I have reviewed the patients History and Physical, chart, labs and discussed the procedure including the risks, benefits and alternatives for the proposed anesthesia with the patient or authorized representative who has indicated his/her understanding and acceptance.       Plan Discussed with: CRNA  Anesthesia Plan Comments:         Anesthesia Quick Evaluation

## 2020-04-03 NOTE — Transfer of Care (Signed)
Immediate Anesthesia Transfer of Care Note  Patient: Willie Pineda  Procedure(s) Performed: COLONOSCOPY WITH PROPOFOL (N/A ) POLYPECTOMY  Patient Location: PACU  Anesthesia Type:General  Level of Consciousness: drowsy, patient cooperative and responds to stimulation  Airway & Oxygen Therapy: Patient Spontanous Breathing and Patient connected to face mask oxygen  Post-op Assessment: Report given to RN and Post -op Vital signs reviewed and stable  Post vital signs: Reviewed and stable  Last Vitals:  Vitals Value Taken Time  BP    Temp    Pulse    Resp    SpO2      Last Pain:  Vitals:   04/03/20 1143  TempSrc: Oral  PainSc: 0-No pain         Complications: No apparent anesthesia complications

## 2020-04-03 NOTE — Anesthesia Postprocedure Evaluation (Signed)
Anesthesia Post Note  Patient: Willie Pineda  Procedure(s) Performed: COLONOSCOPY WITH PROPOFOL (N/A ) POLYPECTOMY     Patient location during evaluation: PACU Anesthesia Type: General Level of consciousness: awake and alert, oriented and patient cooperative Pain management: pain level controlled Vital Signs Assessment: post-procedure vital signs reviewed and stable Respiratory status: spontaneous breathing, nonlabored ventilation and respiratory function stable Cardiovascular status: blood pressure returned to baseline and stable Postop Assessment: no apparent nausea or vomiting Anesthetic complications: no Comments: Severe airway obstruction and coughing with MAC, converted to GA/LMA without issue    Last Vitals:  Vitals:   04/03/20 1143 04/03/20 1359  BP: (!) 160/99 (!) 99/53  Pulse: (!) 118 70  Resp: 17 (!) 22  Temp: 37 C (!) 36.4 C  SpO2: 97% 90%    Last Pain:  Vitals:   04/03/20 1359  TempSrc: Axillary  PainSc: 0-No pain                 Jarome Matin Avian [place]

## 2020-04-03 NOTE — Discharge Instructions (Signed)

## 2020-04-03 NOTE — Anesthesia Procedure Notes (Signed)
Procedure Name: LMA Insertion Date/Time: 04/03/2020 1:04 PM Performed by: Glory Buff, CRNA Pre-anesthesia Checklist: Patient identified, Emergency Drugs available, Suction available and Patient being monitored Patient Re-evaluated:Patient Re-evaluated prior to induction Oxygen Delivery Method: Circle system utilized Preoxygenation: Pre-oxygenation with 100% oxygen Induction Type: IV induction Ventilation: Two handed mask ventilation required LMA: LMA inserted LMA Size: 5.0 Number of attempts: 1 Placement Confirmation: positive ETCO2 Tube secured with: Tape Dental Injury: Teeth and Oropharynx as per pre-operative assessment

## 2020-04-04 ENCOUNTER — Encounter: Payer: Self-pay | Admitting: *Deleted

## 2020-04-04 LAB — SURGICAL PATHOLOGY

## 2020-08-19 ENCOUNTER — Other Ambulatory Visit: Payer: Self-pay | Admitting: Student

## 2020-08-19 DIAGNOSIS — R202 Paresthesia of skin: Secondary | ICD-10-CM

## 2020-09-08 ENCOUNTER — Other Ambulatory Visit: Payer: Self-pay

## 2020-09-08 ENCOUNTER — Ambulatory Visit
Admission: RE | Admit: 2020-09-08 | Discharge: 2020-09-08 | Disposition: A | Discharge status: Home / Self Care | Payer: Medicare Other | Source: Ambulatory Visit | Attending: Student | Admitting: Student

## 2020-09-08 DIAGNOSIS — R202 Paresthesia of skin: Secondary | ICD-10-CM

## 2020-12-09 DIAGNOSIS — R040 Epistaxis: Secondary | ICD-10-CM | POA: Diagnosis not present

## 2020-12-19 DIAGNOSIS — H903 Sensorineural hearing loss, bilateral: Secondary | ICD-10-CM | POA: Diagnosis not present

## 2020-12-24 DIAGNOSIS — I6782 Cerebral ischemia: Secondary | ICD-10-CM | POA: Diagnosis not present

## 2020-12-24 DIAGNOSIS — H905 Unspecified sensorineural hearing loss: Secondary | ICD-10-CM | POA: Diagnosis not present

## 2020-12-24 DIAGNOSIS — H903 Sensorineural hearing loss, bilateral: Secondary | ICD-10-CM | POA: Diagnosis not present

## 2021-01-01 DIAGNOSIS — I10 Essential (primary) hypertension: Secondary | ICD-10-CM | POA: Diagnosis not present

## 2021-01-01 DIAGNOSIS — E1165 Type 2 diabetes mellitus with hyperglycemia: Secondary | ICD-10-CM | POA: Diagnosis not present

## 2021-01-01 DIAGNOSIS — E781 Pure hyperglyceridemia: Secondary | ICD-10-CM | POA: Diagnosis not present

## 2021-01-01 DIAGNOSIS — K219 Gastro-esophageal reflux disease without esophagitis: Secondary | ICD-10-CM | POA: Diagnosis not present

## 2021-01-01 DIAGNOSIS — E0840 Diabetes mellitus due to underlying condition with diabetic neuropathy, unspecified: Secondary | ICD-10-CM | POA: Diagnosis not present

## 2021-01-01 DIAGNOSIS — J449 Chronic obstructive pulmonary disease, unspecified: Secondary | ICD-10-CM | POA: Diagnosis not present

## 2021-01-01 DIAGNOSIS — E785 Hyperlipidemia, unspecified: Secondary | ICD-10-CM | POA: Diagnosis not present

## 2021-01-01 DIAGNOSIS — I509 Heart failure, unspecified: Secondary | ICD-10-CM | POA: Diagnosis not present

## 2021-01-01 DIAGNOSIS — I4891 Unspecified atrial fibrillation: Secondary | ICD-10-CM | POA: Diagnosis not present

## 2021-01-20 DIAGNOSIS — I4891 Unspecified atrial fibrillation: Secondary | ICD-10-CM | POA: Diagnosis not present

## 2021-01-20 DIAGNOSIS — I509 Heart failure, unspecified: Secondary | ICD-10-CM | POA: Diagnosis not present

## 2021-01-20 DIAGNOSIS — I10 Essential (primary) hypertension: Secondary | ICD-10-CM | POA: Diagnosis not present

## 2021-01-20 DIAGNOSIS — K219 Gastro-esophageal reflux disease without esophagitis: Secondary | ICD-10-CM | POA: Diagnosis not present

## 2021-01-20 DIAGNOSIS — E785 Hyperlipidemia, unspecified: Secondary | ICD-10-CM | POA: Diagnosis not present

## 2021-01-20 DIAGNOSIS — E781 Pure hyperglyceridemia: Secondary | ICD-10-CM | POA: Diagnosis not present

## 2021-01-20 DIAGNOSIS — J449 Chronic obstructive pulmonary disease, unspecified: Secondary | ICD-10-CM | POA: Diagnosis not present

## 2021-01-20 DIAGNOSIS — E1165 Type 2 diabetes mellitus with hyperglycemia: Secondary | ICD-10-CM | POA: Diagnosis not present

## 2021-01-23 DIAGNOSIS — M25512 Pain in left shoulder: Secondary | ICD-10-CM | POA: Diagnosis not present

## 2021-02-03 DIAGNOSIS — E1165 Type 2 diabetes mellitus with hyperglycemia: Secondary | ICD-10-CM | POA: Diagnosis not present

## 2021-02-03 DIAGNOSIS — E1140 Type 2 diabetes mellitus with diabetic neuropathy, unspecified: Secondary | ICD-10-CM | POA: Diagnosis not present

## 2021-02-05 DIAGNOSIS — G4733 Obstructive sleep apnea (adult) (pediatric): Secondary | ICD-10-CM | POA: Diagnosis not present

## 2021-02-19 DIAGNOSIS — E11621 Type 2 diabetes mellitus with foot ulcer: Secondary | ICD-10-CM | POA: Diagnosis not present

## 2021-02-19 DIAGNOSIS — M2041 Other hammer toe(s) (acquired), right foot: Secondary | ICD-10-CM | POA: Diagnosis not present

## 2021-02-19 DIAGNOSIS — E1142 Type 2 diabetes mellitus with diabetic polyneuropathy: Secondary | ICD-10-CM | POA: Diagnosis not present

## 2021-02-19 DIAGNOSIS — L02612 Cutaneous abscess of left foot: Secondary | ICD-10-CM | POA: Diagnosis not present

## 2021-02-19 DIAGNOSIS — L03032 Cellulitis of left toe: Secondary | ICD-10-CM | POA: Diagnosis not present

## 2021-02-19 DIAGNOSIS — L97522 Non-pressure chronic ulcer of other part of left foot with fat layer exposed: Secondary | ICD-10-CM | POA: Diagnosis not present

## 2021-02-19 DIAGNOSIS — M2042 Other hammer toe(s) (acquired), left foot: Secondary | ICD-10-CM | POA: Diagnosis not present

## 2021-02-20 DIAGNOSIS — M25512 Pain in left shoulder: Secondary | ICD-10-CM | POA: Diagnosis not present

## 2021-02-23 DIAGNOSIS — Z7189 Other specified counseling: Secondary | ICD-10-CM | POA: Diagnosis not present

## 2021-02-23 DIAGNOSIS — I739 Peripheral vascular disease, unspecified: Secondary | ICD-10-CM | POA: Diagnosis not present

## 2021-02-23 DIAGNOSIS — Z0000 Encounter for general adult medical examination without abnormal findings: Secondary | ICD-10-CM | POA: Diagnosis not present

## 2021-02-23 DIAGNOSIS — E559 Vitamin D deficiency, unspecified: Secondary | ICD-10-CM | POA: Diagnosis not present

## 2021-02-23 DIAGNOSIS — E785 Hyperlipidemia, unspecified: Secondary | ICD-10-CM | POA: Diagnosis not present

## 2021-02-23 DIAGNOSIS — D6869 Other thrombophilia: Secondary | ICD-10-CM | POA: Diagnosis not present

## 2021-02-23 DIAGNOSIS — E0840 Diabetes mellitus due to underlying condition with diabetic neuropathy, unspecified: Secondary | ICD-10-CM | POA: Diagnosis not present

## 2021-02-23 DIAGNOSIS — I4891 Unspecified atrial fibrillation: Secondary | ICD-10-CM | POA: Diagnosis not present

## 2021-02-23 DIAGNOSIS — I509 Heart failure, unspecified: Secondary | ICD-10-CM | POA: Diagnosis not present

## 2021-02-23 DIAGNOSIS — E1165 Type 2 diabetes mellitus with hyperglycemia: Secondary | ICD-10-CM | POA: Diagnosis not present

## 2021-02-23 DIAGNOSIS — Z79899 Other long term (current) drug therapy: Secondary | ICD-10-CM | POA: Diagnosis not present

## 2021-02-23 DIAGNOSIS — E781 Pure hyperglyceridemia: Secondary | ICD-10-CM | POA: Diagnosis not present

## 2021-02-23 DIAGNOSIS — J449 Chronic obstructive pulmonary disease, unspecified: Secondary | ICD-10-CM | POA: Diagnosis not present

## 2021-02-25 DIAGNOSIS — I743 Embolism and thrombosis of arteries of the lower extremities: Secondary | ICD-10-CM | POA: Diagnosis not present

## 2021-02-25 DIAGNOSIS — I10 Essential (primary) hypertension: Secondary | ICD-10-CM | POA: Diagnosis not present

## 2021-02-27 ENCOUNTER — Other Ambulatory Visit: Payer: Self-pay | Admitting: Orthopaedic Surgery

## 2021-02-27 DIAGNOSIS — M25512 Pain in left shoulder: Secondary | ICD-10-CM

## 2021-03-03 ENCOUNTER — Inpatient Hospital Stay (HOSPITAL_COMMUNITY): Payer: Medicare Other

## 2021-03-03 ENCOUNTER — Ambulatory Visit
Admission: RE | Admit: 2021-03-03 | Discharge: 2021-03-03 | Disposition: A | Discharge status: Home / Self Care | Payer: Medicare Other | Source: Ambulatory Visit | Attending: Orthopaedic Surgery | Admitting: Orthopaedic Surgery

## 2021-03-03 ENCOUNTER — Inpatient Hospital Stay (HOSPITAL_COMMUNITY)
Admission: EM | Admit: 2021-03-03 | Discharge: 2021-03-06 | DRG: 493 | Disposition: A | Discharge status: Home / Self Care | Payer: Medicare Other | Attending: Family Medicine | Admitting: Family Medicine

## 2021-03-03 ENCOUNTER — Other Ambulatory Visit: Payer: Self-pay

## 2021-03-03 ENCOUNTER — Encounter (HOSPITAL_COMMUNITY): Payer: Self-pay | Admitting: Emergency Medicine

## 2021-03-03 DIAGNOSIS — Z20822 Contact with and (suspected) exposure to covid-19: Secondary | ICD-10-CM | POA: Diagnosis present

## 2021-03-03 DIAGNOSIS — E1149 Type 2 diabetes mellitus with other diabetic neurological complication: Secondary | ICD-10-CM | POA: Diagnosis not present

## 2021-03-03 DIAGNOSIS — Z8673 Personal history of transient ischemic attack (TIA), and cerebral infarction without residual deficits: Secondary | ICD-10-CM

## 2021-03-03 DIAGNOSIS — Z87891 Personal history of nicotine dependence: Secondary | ICD-10-CM | POA: Diagnosis not present

## 2021-03-03 DIAGNOSIS — R9431 Abnormal electrocardiogram [ECG] [EKG]: Secondary | ICD-10-CM

## 2021-03-03 DIAGNOSIS — Z79890 Hormone replacement therapy (postmenopausal): Secondary | ICD-10-CM

## 2021-03-03 DIAGNOSIS — L97529 Non-pressure chronic ulcer of other part of left foot with unspecified severity: Secondary | ICD-10-CM | POA: Diagnosis not present

## 2021-03-03 DIAGNOSIS — E1169 Type 2 diabetes mellitus with other specified complication: Secondary | ICD-10-CM | POA: Diagnosis present

## 2021-03-03 DIAGNOSIS — L02414 Cutaneous abscess of left upper limb: Secondary | ICD-10-CM | POA: Diagnosis present

## 2021-03-03 DIAGNOSIS — G8929 Other chronic pain: Secondary | ICD-10-CM | POA: Diagnosis not present

## 2021-03-03 DIAGNOSIS — I10 Essential (primary) hypertension: Secondary | ICD-10-CM | POA: Diagnosis not present

## 2021-03-03 DIAGNOSIS — M19012 Primary osteoarthritis, left shoulder: Secondary | ICD-10-CM | POA: Diagnosis present

## 2021-03-03 DIAGNOSIS — I509 Heart failure, unspecified: Secondary | ICD-10-CM

## 2021-03-03 DIAGNOSIS — M00812 Arthritis due to other bacteria, left shoulder: Principal | ICD-10-CM | POA: Diagnosis present

## 2021-03-03 DIAGNOSIS — Z79899 Other long term (current) drug therapy: Secondary | ICD-10-CM | POA: Diagnosis not present

## 2021-03-03 DIAGNOSIS — I110 Hypertensive heart disease with heart failure: Secondary | ICD-10-CM | POA: Diagnosis not present

## 2021-03-03 DIAGNOSIS — M948X1 Other specified disorders of cartilage, shoulder: Secondary | ICD-10-CM | POA: Diagnosis not present

## 2021-03-03 DIAGNOSIS — J449 Chronic obstructive pulmonary disease, unspecified: Secondary | ICD-10-CM

## 2021-03-03 DIAGNOSIS — Z96642 Presence of left artificial hip joint: Secondary | ICD-10-CM | POA: Diagnosis not present

## 2021-03-03 DIAGNOSIS — Z6836 Body mass index (BMI) 36.0-36.9, adult: Secondary | ICD-10-CM

## 2021-03-03 DIAGNOSIS — Z880 Allergy status to penicillin: Secondary | ICD-10-CM | POA: Diagnosis not present

## 2021-03-03 DIAGNOSIS — I482 Chronic atrial fibrillation, unspecified: Secondary | ICD-10-CM | POA: Diagnosis not present

## 2021-03-03 DIAGNOSIS — E785 Hyperlipidemia, unspecified: Secondary | ICD-10-CM | POA: Diagnosis not present

## 2021-03-03 DIAGNOSIS — M86622 Other chronic osteomyelitis, left  humerus: Secondary | ICD-10-CM | POA: Diagnosis not present

## 2021-03-03 DIAGNOSIS — E669 Obesity, unspecified: Secondary | ICD-10-CM | POA: Diagnosis present

## 2021-03-03 DIAGNOSIS — E11621 Type 2 diabetes mellitus with foot ulcer: Secondary | ICD-10-CM | POA: Diagnosis not present

## 2021-03-03 DIAGNOSIS — M8660 Other chronic osteomyelitis, unspecified site: Secondary | ICD-10-CM

## 2021-03-03 DIAGNOSIS — E11628 Type 2 diabetes mellitus with other skin complications: Secondary | ICD-10-CM | POA: Diagnosis not present

## 2021-03-03 DIAGNOSIS — G8918 Other acute postprocedural pain: Secondary | ICD-10-CM | POA: Diagnosis not present

## 2021-03-03 DIAGNOSIS — M009 Pyogenic arthritis, unspecified: Secondary | ICD-10-CM

## 2021-03-03 DIAGNOSIS — M75122 Complete rotator cuff tear or rupture of left shoulder, not specified as traumatic: Secondary | ICD-10-CM | POA: Diagnosis not present

## 2021-03-03 DIAGNOSIS — Z823 Family history of stroke: Secondary | ICD-10-CM

## 2021-03-03 DIAGNOSIS — L089 Local infection of the skin and subcutaneous tissue, unspecified: Secondary | ICD-10-CM | POA: Diagnosis not present

## 2021-03-03 DIAGNOSIS — Z7901 Long term (current) use of anticoagulants: Secondary | ICD-10-CM | POA: Diagnosis not present

## 2021-03-03 DIAGNOSIS — I5032 Chronic diastolic (congestive) heart failure: Secondary | ICD-10-CM | POA: Diagnosis not present

## 2021-03-03 DIAGNOSIS — M25512 Pain in left shoulder: Secondary | ICD-10-CM | POA: Diagnosis not present

## 2021-03-03 DIAGNOSIS — E1142 Type 2 diabetes mellitus with diabetic polyneuropathy: Secondary | ICD-10-CM | POA: Diagnosis present

## 2021-03-03 DIAGNOSIS — M60012 Infective myositis, left shoulder: Secondary | ICD-10-CM | POA: Diagnosis present

## 2021-03-03 DIAGNOSIS — Z96653 Presence of artificial knee joint, bilateral: Secondary | ICD-10-CM | POA: Diagnosis present

## 2021-03-03 DIAGNOSIS — E876 Hypokalemia: Secondary | ICD-10-CM | POA: Diagnosis present

## 2021-03-03 DIAGNOSIS — Z471 Aftercare following joint replacement surgery: Secondary | ICD-10-CM | POA: Diagnosis not present

## 2021-03-03 DIAGNOSIS — Z09 Encounter for follow-up examination after completed treatment for conditions other than malignant neoplasm: Secondary | ICD-10-CM

## 2021-03-03 DIAGNOSIS — I4891 Unspecified atrial fibrillation: Secondary | ICD-10-CM | POA: Diagnosis not present

## 2021-03-03 DIAGNOSIS — G4733 Obstructive sleep apnea (adult) (pediatric): Secondary | ICD-10-CM | POA: Diagnosis present

## 2021-03-03 DIAGNOSIS — Z96612 Presence of left artificial shoulder joint: Secondary | ICD-10-CM | POA: Diagnosis not present

## 2021-03-03 DIAGNOSIS — E1151 Type 2 diabetes mellitus with diabetic peripheral angiopathy without gangrene: Secondary | ICD-10-CM | POA: Diagnosis not present

## 2021-03-03 DIAGNOSIS — S46112A Strain of muscle, fascia and tendon of long head of biceps, left arm, initial encounter: Secondary | ICD-10-CM | POA: Diagnosis not present

## 2021-03-03 DIAGNOSIS — Z800 Family history of malignant neoplasm of digestive organs: Secondary | ICD-10-CM

## 2021-03-03 DIAGNOSIS — I959 Hypotension, unspecified: Secondary | ICD-10-CM | POA: Diagnosis present

## 2021-03-03 DIAGNOSIS — M65812 Other synovitis and tenosynovitis, left shoulder: Secondary | ICD-10-CM | POA: Diagnosis not present

## 2021-03-03 LAB — CBC
HCT: 46.4 % (ref 39.0–52.0)
Hemoglobin: 15.1 g/dL (ref 13.0–17.0)
MCH: 31.9 pg (ref 26.0–34.0)
MCHC: 32.5 g/dL (ref 30.0–36.0)
MCV: 97.9 fL (ref 80.0–100.0)
Platelets: 348 10*3/uL (ref 150–400)
RBC: 4.74 MIL/uL (ref 4.22–5.81)
RDW: 12.5 % (ref 11.5–15.5)
WBC: 12.5 10*3/uL — ABNORMAL HIGH (ref 4.0–10.5)
nRBC: 0 % (ref 0.0–0.2)

## 2021-03-03 LAB — RESP PANEL BY RT-PCR (FLU A&B, COVID) ARPGX2
Influenza A by PCR: NEGATIVE
Influenza B by PCR: NEGATIVE
SARS Coronavirus 2 by RT PCR: NEGATIVE

## 2021-03-03 LAB — BASIC METABOLIC PANEL
Anion gap: 11 (ref 5–15)
BUN: 28 mg/dL — ABNORMAL HIGH (ref 8–23)
CO2: 26 mmol/L (ref 22–32)
Calcium: 8.7 mg/dL — ABNORMAL LOW (ref 8.9–10.3)
Chloride: 101 mmol/L (ref 98–111)
Creatinine, Ser: 0.86 mg/dL (ref 0.61–1.24)
GFR, Estimated: >60 mL/min (ref >60)
Glucose, Bld: 134 mg/dL — ABNORMAL HIGH (ref 70–99)
Potassium: 3.4 mmol/L — ABNORMAL LOW (ref 3.5–5.1)
Sodium: 138 mmol/L (ref 135–145)

## 2021-03-03 MED ORDER — HYDROMORPHONE HCL 1 MG/ML IJ SOLN: 1.0000 mg | INTRAMUSCULAR | Status: DC | PRN

## 2021-03-03 MED ORDER — ACETAMINOPHEN 325 MG PO TABS
650.0000 mg | ORAL_TABLET | Freq: Four times a day (QID) | ORAL | Status: DC | PRN
  Administered 2021-03-04: 650 mg via ORAL
  Filled 2021-03-03: qty 2

## 2021-03-03 MED ORDER — VANCOMYCIN HCL IN DEXTROSE 1-5 GM/200ML-% IV SOLN
1000.0000 mg | Freq: Two times a day (BID) | INTRAVENOUS | Status: DC
  Administered 2021-03-04 – 2021-03-06 (×5): 1000 mg via INTRAVENOUS
  Filled 2021-03-03 (×5): qty 200

## 2021-03-03 MED ORDER — VANCOMYCIN HCL 1500 MG/300ML IV SOLN
1500.0000 mg | Freq: Once | INTRAVENOUS | Status: AC
  Administered 2021-03-03: 1500 mg via INTRAVENOUS
  Filled 2021-03-03: qty 300

## 2021-03-03 MED ORDER — GADOBUTROL 1 MMOL/ML IV SOLN
10.0000 mL | Freq: Once | INTRAVENOUS | Status: AC | PRN
  Administered 2021-03-03: 10 mL via INTRAVENOUS

## 2021-03-03 MED ORDER — SENNOSIDES-DOCUSATE SODIUM 8.6-50 MG PO TABS: 1.0000 | ORAL_TABLET | Freq: Every evening | ORAL | Status: DC | PRN

## 2021-03-03 MED ORDER — OXYCODONE-ACETAMINOPHEN 5-325 MG PO TABS: 2.0000 | ORAL_TABLET | Freq: Four times a day (QID) | ORAL | Status: DC | PRN

## 2021-03-03 MED ORDER — VANCOMYCIN HCL IN DEXTROSE 1-5 GM/200ML-% IV SOLN
1000.0000 mg | Freq: Once | INTRAVENOUS | Status: AC
  Administered 2021-03-03: 1000 mg via INTRAVENOUS
  Filled 2021-03-03: qty 200

## 2021-03-03 MED ORDER — ACETAMINOPHEN 500 MG PO TABS
1000.0000 mg | ORAL_TABLET | Freq: Once | ORAL | Status: AC
  Administered 2021-03-03: 1000 mg via ORAL
  Filled 2021-03-03: qty 2

## 2021-03-03 MED ORDER — IPRATROPIUM-ALBUTEROL 0.5-2.5 (3) MG/3ML IN SOLN: 3.0000 mL | Freq: Four times a day (QID) | RESPIRATORY_TRACT | Status: DC | PRN

## 2021-03-03 MED ORDER — ACETAMINOPHEN 650 MG RE SUPP: 650.0000 mg | Freq: Four times a day (QID) | RECTAL | Status: DC | PRN

## 2021-03-03 NOTE — Progress Notes (Signed)
Pharmacy Antibiotic Note  Willie Pineda is a 75 y.o. male admitted on 03/03/2021 with septic arthritis of left shoulder.  PMH significant for AFib, HTN, DM, OA, CHF, COPD.  Reports chronic shoulder pain due to an accident years ago.  CT today showed abscess/septic joint.  Pharmacy has been consulted for Vancomycin dosing.  Patient received Vancomycin 1gm IV x 1 in the ED.  Plan: Give an additional Vancomyin 1500mg  IV x 1 dose following the initial 1gm dose (total of 2500mg  IV loading dose) then continue with Vancomycin 1000 mg IV Q 12 hrs. Goal AUC 400-550.  Expected AUC: 460.2  SCr used: 0.86 using VD = 0.5  Follow renal function, clinical symptoms  Check vancomycin levels as needed  Height: 6' (182.9 cm) Weight: 120.7 kg (266 lb) IBW/kg (Calculated) : 77.6  Temp (24hrs), Avg:98.3 F (36.8 C), Min:98.3 F (36.8 C), Max:98.3 F (36.8 C)  Recent Labs  Lab 03/03/21 1900  WBC 12.5*  CREATININE 0.86    Estimated Creatinine Clearance: 99.5 mL/min (by C-G formula based on SCr of 0.86 mg/dL).    Allergies  Allergen Reactions  . Penicillins Anaphylaxis  . Alfalfa   . Dymista [Azelastine-Fluticasone] Other (See Comments)    Burning sensation   . Flonase [Fluticasone Propionate] Other (See Comments)    burning  . Hydrocodone Other (See Comments)    SWEATING  . Spironolactone Other (See Comments)    Makes chest swelling - or as pt puts it "makes his breasts swell and sore"  . Warfarin And Related Other (See Comments)    Pt states it makes his INR critical levels    Antimicrobials this admission: 3/29 Vanc >>     Dose adjustments this admission:    Microbiology results:   Thank you for allowing pharmacy to be a part of this patient's care.  Everette Rank, PharmD 03/03/2021 9:52 PM

## 2021-03-03 NOTE — Anesthesia Preprocedure Evaluation (Addendum)
Anesthesia Evaluation  Patient identified by MRN, date of birth, ID band Patient awake    Reviewed: Allergy & Precautions, NPO status , Patient's Chart, lab work & pertinent test results  History of Anesthesia Complications (+) history of anesthetic complications (Had issues with bronchospasm during anesthesia with one of his cscopes in past at a day surgery center- can't remember if it was before or after his 2018 cscope at [facility] (but had no issues during the 2018 cscope))  Airway Mallampati: II  TM Distance: >3 FB Neck ROM: Full    Dental no notable dental hx. (+) Teeth Intact, Dental Advisory Given   Pulmonary sleep apnea and Continuous Positive Airway Pressure Ventilation , COPD, Patient abstained from smoking., former smoker,  20 ppy hx of smoking   Pulmonary exam normal breath sounds clear to auscultation       Cardiovascular hypertension, Pt. on medications and Pt. on home beta blockers +CHF  Normal cardiovascular exam+ dysrhythmias Atrial Fibrillation  Rhythm:Regular Rate:Normal     Neuro/Psych PSYCHIATRIC DISORDERS CVA, Residual Symptoms    GI/Hepatic Neg liver ROS, GERD  ,  Endo/Other  diabetes, Well Controlled, Type 2  Renal/GU negative Renal ROS     Musculoskeletal  (+) Arthritis ,   Abdominal   Peds  Hematology   Anesthesia Other Findings All: PCN, Azelastine, Hydrocodone, warfarin  Reproductive/Obstetrics                          Anesthesia Physical Anesthesia Plan  ASA: III  Anesthesia Plan: General   Post-op Pain Management:  Regional for Post-op pain   Induction: Intravenous  PONV Risk Score and Plan: Treatment may vary due to age or medical condition, Dexamethasone and Ondansetron  Airway Management Planned: Oral ETT  Additional Equipment:   Intra-op Plan:   Post-operative Plan: Possible Post-op intubation/ventilation and Extubation in OR  Informed Consent: I have  reviewed the patients History and Physical, chart, labs and discussed the procedure including the risks, benefits and alternatives for the proposed anesthesia with the patient or authorized representative who has indicated his/her understanding and acceptance.     Dental advisory given  Plan Discussed with: CRNA and Anesthesiologist  Anesthesia Plan Comments: (GA w L ISB w  Exparel)       Anesthesia Quick Evaluation

## 2021-03-03 NOTE — ED Notes (Signed)
ED TO INPATIENT HANDOFF REPORT  Name/Age/Gender Willie Pineda 75 y.o. male  Code Status    Code Status Orders  (From admission, onward)         Start     Ordered   03/03/21 2124  Full code  Continuous        03/03/21 2123        Code Status History    This patient has a current code status but no historical code status.   Advance Care Planning Activity      Home/SNF/Other Home  Chief Complaint Septic arthritis of shoulder, left ([place]) [M00.9]  Level of Care/Admitting Diagnosis ED Disposition    ED Disposition Condition Comment   Admit  Hospital Area: [facility] [[id number]]  Level of Care: Telemetry [5]  Admit to tele based on following criteria: Complex arrhythmia (Bradycardia/Tachycardia)  May admit patient to [facility] or [facility] if equivalent level of care is available:: Yes  Covid Evaluation: Asymptomatic Screening Protocol (No Symptoms)  Diagnosis: Septic arthritis of shoulder, left [facility]) [[id number]]  Admitting Physician: Eben Burow [[id number]]  Attending Physician: Eben Burow [[id number]]  Estimated length of stay: past midnight tomorrow  Certification:: I certify this patient will need inpatient services for at least 2 midnights       Medical History Past Medical History:  Diagnosis Date  . Arthritis    "hands primarily" (05/05/2016)  . Atrial fibrillation ([place])   . Back pain    age related  . CHF (congestive heart failure) ([place])   . Complication of anesthesia    Bronchial spasms  . COPD (chronic obstructive pulmonary disease) ([facility])    inhalers as needed  . Depression    takes Zoloft daily  . Diabetic peripheral neuropathy ([place]) 01/03/2019  . Dyspnea    with exertion  . Dysrhythmia    A Fib-takes Xarelto daily  . Enlarged prostate    benign  . GERD (gastroesophageal reflux disease)    takes Omeprazole daily  . 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 Hyperlipidemia     takes Atorvastatin daily  . Hypertension    takes Metoprolol,HCTZ,and Losartan daily.Takes Clonidine if needed  . Nocturia   . Peripheral neuropathy    takes Gabapentin daily  . Pneumonia ?2015  . Restless leg   . Sleep apnea    uses Bipap  . Stroke [facility]) 2012   denies residual on 05/05/2016  . Type II diabetes mellitus ([facility])    borderline but doesn't take any meds (05/05/2016)    Allergies Allergies  Allergen Reactions  . Penicillins Anaphylaxis  . Alfalfa   . Dymista [Azelastine-Fluticasone] Other (See Comments)    Burning sensation   . Flonase [Fluticasone Propionate] Other (See Comments)    burning  . Hydrocodone Other (See Comments)    SWEATING  . Spironolactone Other (See Comments)    Makes chest swelling - or as pt puts it "makes his breasts swell and sore"  . Warfarin And Related Other (See Comments)    Pt states it makes his INR critical levels    IV Location/Drains/Wounds Patient Lines/Drains/Airways Status    Active Line/Drains/Airways    Name Placement date Placement time Site Days   Peripheral IV 03/03/21 Right;Posterior;Distal Forearm 03/03/21  1841  Forearm  less than 1   Incision (Closed) 04/06/16 Knee Right 04/06/16  0947  -- 1792   Incision (Closed) 05/04/16 Leg Right 05/04/16  1553  -- 1764   Incision (Closed) 06/10/17 Eye Left 06/10/17  1221  -- 1362   Incision (Closed) 06/10/17 Eye Right 06/10/17  1221  -- 1362          Labs/Imaging Results for orders placed or performed during the hospital encounter of 03/03/21 (from the past 48 hour(s))  CBC     Status: Abnormal   Collection Time: 03/03/21  7:00 PM  Result Value Ref Range   WBC 12.5 (H) 4.0 - 10.5 K/uL   RBC 4.74 4.22 - 5.81 MIL/uL   Hemoglobin 15.1 13.0 - 17.0 g/dL   HCT 46.4 39.0 - 52.0 %   MCV 97.9 80.0 - 100.0 fL   MCH 31.9 26.0 - 34.0 pg   MCHC 32.5 30.0 - 36.0 g/dL   RDW 12.5 11.5 - 15.5 %   Platelets 348 150 - 400 K/uL   nRBC 0.0 0.0 - 0.2 %    Comment: Performed at [facility], [place] [address], [place], [place] [address]  Basic metabolic panel     Status: Abnormal   Collection Time: 03/03/21  7:00 PM  Result Value Ref Range   Sodium 138 135 - 145 mmol/L   Potassium 3.4 (L) 3.5 - 5.1 mmol/L   Chloride 101 98 - 111 mmol/L   CO2 26 22 - 32 mmol/L   Glucose, Bld 134 (H) 70 - 99 mg/dL    Comment: Glucose reference range applies only to samples taken after fasting for at least 8 hours.   BUN 28 (H) 8 - 23 mg/dL   Creatinine, Ser 0.86 0.61 - 1.24 mg/dL   Calcium 8.7 (L) 8.9 - 10.3 mg/dL   GFR, Estimated >60 >60 mL/min    Comment: (NOTE) Calculated using the CKD-EPI Creatinine Equation (2021)    Anion gap 11 5 - 15    Comment: Performed at [facility], [place] [address] [place],  [address]  Resp Panel by RT-PCR (Flu A&B, Covid) Nasopharyngeal Swab     Status: None   Collection Time: 03/03/21  7:00 PM   Specimen: Nasopharyngeal Swab; Nasopharyngeal(NP) swabs in vial transport medium  Result Value Ref Range   SARS Coronavirus 2 by RT PCR NEGATIVE NEGATIVE    Comment: (NOTE) SARS-CoV-2 target nucleic acids are NOT DETECTED.  The SARS-CoV-2 RNA is generally detectable in upper respiratory specimens during the acute phase of infection. The lowest concentration of SARS-CoV-2 viral copies this assay can detect is 138 copies/mL. A negative result does not preclude SARS-Cov-2 infection and should not be used as the sole basis for treatment or other patient management decisions. A negative result may occur with  improper specimen collection/handling, submission of specimen other than nasopharyngeal swab, presence of viral mutation(s) within the areas targeted by this assay, and inadequate number of viral copies(<138 copies/mL). A negative result must be combined with clinical observations, patient history, and epidemiological information. The expected result is Negative.  Fact Sheet for Patients:   EntrepreneurPulse.com.au  Fact Sheet for Healthcare Providers:  IncredibleEmployment.be  This test is no t yet approved or cleared by the [place] FDA and  has been authorized for detection and/or diagnosis of SARS-CoV-2 by FDA under an Emergency Use Authorization (EUA). This EUA will remain  in effect (meaning this test can be used) for the duration of the COVID-19 declaration under Section 564(b)(1) of the Act, 21 U.S.C.section 360bbb-3(b)(1), unless the authorization is terminated  or revoked sooner.       Influenza  A by PCR NEGATIVE NEGATIVE   Influenza B by PCR NEGATIVE NEGATIVE    Comment: (NOTE) The Xpert Xpress SARS-CoV-2/FLU/RSV plus assay is intended as an aid in the diagnosis of influenza from Nasopharyngeal swab specimens and should not be used as a sole basis for treatment. Nasal washings and aspirates are unacceptable for Xpert Xpress SARS-CoV-2/FLU/RSV testing.  Fact Sheet for Patients: EntrepreneurPulse.com.au  Fact Sheet for Healthcare Providers: IncredibleEmployment.be  This test is not yet approved or cleared by the [place] FDA and has been authorized for detection and/or diagnosis of SARS-CoV-2 by FDA under an Emergency Use Authorization (EUA). This EUA will remain in effect (meaning this test can be used) for the duration of the COVID-19 declaration under Section 564(b)(1) of the Act, 21 U.S.C. section 360bbb-3(b)(1), unless the authorization is terminated or revoked.  Performed at [facility], [place] [address], [place], [place] [address]    CT SHOULDER LEFT WO CONTRAST  Result Date: 03/03/2021 CLINICAL DATA:  Left shoulder pain and limited range of motion for 3 months since a blow to the shoulder. EXAM: CT OF THE UPPER LEFT EXTREMITY WITHOUT CONTRAST TECHNIQUE: Multidetector CT imaging of the upper left extremity was performed according to the standard  protocol. COMPARISON:  None. FINDINGS: Bones/Joint/Cartilage No fracture or dislocation is identified. The patient has moderate acromioclavicular and mild appearing glenohumeral osteoarthritis. Pre acromion type os acromiale is identified. Cystic change and sclerosis in the acromion is identified and likely due to chronic abutment with the humeral head. Glenohumeral joint effusion is identified. Ligaments Suboptimally assessed by CT. Muscles and Tendons Although discrete measurement is difficult, there is a gas containing fluid collection in the subscapularis measuring approximately 6.7 cm transverse by 3 cm AP by 4.2 cm craniocaudal. A second gas containing fluid collection in the anterior aspect of the deltoid measures approximately 6.6 cm craniocaudal by 3 cm transverse by 1.5 cm AP. These collections may communicate. Although evaluation of the rotator cuff tendons is limited on CT, the supraspinatus and infraspinatus tendons appear completely torn with retraction to nearly the glenohumeral joint. There is severe infraspinatus and mild supraspinatus atrophy. Soft tissues As described above. IMPRESSION: Gas containing fluid collections in the subscapularis and deltoid highly suspicious for pyomyositis. The patient also has a glenohumeral joint effusion which is likely septic given these findings. Recommend MRI of the shoulder with and without contrast for further evaluation and to assess for osteomyelitis. Mild sclerosis and fragmentation of the acromion is likely due to chronic abutment with the humeral head. Evaluation of the rotator cuff is limited on CT but the supraspinatus and infraspinatus appear completely torn and retracted to the glenoid. There is severe infraspinatus and moderate supraspinatus fatty replacement. These results will be called to the ordering clinician or representative by the Radiologist Assistant, and communication documented in the PACS or [employer]. Electronically Signed   By:  Inge Rise M.D.   On: 03/03/2021 15:00    Pending Labs Unresulted Labs (From admission, onward)          Start     Ordered   03/04/21 3710  Basic metabolic panel  Tomorrow morning,   R        03/03/21 2123   03/04/21 0500  CBC  Tomorrow morning,   R        03/03/21 2123          Vitals/Pain Today's Vitals   03/03/21 2000 03/03/21 2006 03/03/21 2030 03/03/21 2100  BP: (!) 141/105 (!) 141/105 Marland Kitchen)  158/108 (!) 153/126  Pulse: 63 73 79 67  Resp: 16 20 17  (!) 26  Temp:      TempSrc:      SpO2: 96% 97% 96% 95%  Weight:      Height:      PainSc:        Isolation Precautions Airborne and Contact precautions  Medications Medications  vancomycin (VANCOCIN) IVPB 1000 mg/200 mL premix (1,000 mg Intravenous New Bag/Given 03/03/21 2103)  oxyCODONE-acetaminophen (PERCOCET/ROXICET) 5-325 MG per tablet 2 tablet (has no administration in time range)  acetaminophen (TYLENOL) tablet 650 mg (has no administration in time range)    Or  acetaminophen (TYLENOL) suppository 650 mg (has no administration in time range)  HYDROmorphone (DILAUDID) injection 1 mg (has no administration in time range)  senna-docusate (Senokot-S) tablet 1 tablet (has no administration in time range)  ipratropium-albuterol (DUONEB) 0.5-2.5 (3) MG/3ML nebulizer solution 3 mL (has no administration in time range)  vancomycin (VANCOREADY) IVPB 1500 mg/300 mL (has no administration in time range)  acetaminophen (TYLENOL) tablet 1,000 mg (1,000 mg Oral Given 03/03/21 2103)    Mobility walks

## 2021-03-03 NOTE — H&P (Signed)
History and Physical    Willie Pineda UXL:[id number] DOB: 05-21-1946 DOA: 03/03/2021  PCP: Josetta Huddle, MD   Patient coming from:  Home  Chief Complaint: Left shoulder pain with abnormal CT scan of shoulder  HPI: Willie Pineda is a 75 y.o. male with medical history significant for atrial fibrillation, HTN, DMT2-diet controlled, OA, CHF, COPD, OSA who presents with shoulder pain and had CT scan ordered by orthopedic surgery that was abnormal. 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  His pain worsened in December after a fall when visiting his daughter in [place]. He has intermittent tingling in his left hand but has full movement of his hand and grip. He has not been dropping things. He has limitation of movement of his left shoulder over past few months due to pain.  He has been seen by Dr. Percell Miller with orthopedics.  He said 2 weeks ago his pain got markedly worse and he started seeing Dr. Griffin Basil. Was planning to have a shoulder replacement surgery performed and was sent for CT before the surgery. He had a CT scan done today of his shoulder which shows an abscess/septic joint. Orthopedics sent to hospital for admission and initiation of antibiotic therapy. Surgery is planned for tomorrow for a washout.  He denies any known fevers.  He has been using Tylenol for pain.    Review of Systems:  General: Denies fever, chills, weight loss, night sweats.  Denies dizziness.  Denies change in appetite HENT: Denies head trauma, headache, denies change in hearing, tinnitus.  Denies nasal bleeding.  Denies sore throat, sores in mouth.  Denies difficulty swallowing Eyes: Denies blurry vision, pain in eye, drainage.  Denies discoloration of eyes. Neck: Denies pain.  Denies swelling.  Denies pain with movement. Cardiovascular: Denies chest pain, palpitations.  Denies edema.  Denies orthopnea Respiratory: Denies shortness of breath, cough.  Denies wheezing.   Denies sputum production Gastrointestinal: Denies abdominal pain, swelling.  Denies nausea, vomiting, diarrhea.  Denies melena.  Denies hematemesis. Musculoskeletal: Reports pain in left shoulder and limitation of movement left shoulder.  Denies deformity  Genitourinary: Denies pelvic pain.  Denies urinary frequency or hesitancy.  Denies dysuria.  Skin: Denies rash.  Denies petechiae, purpura, ecchymosis. Neurological: Denies headache. Denies syncope. Denies seizure activity. Denies slurred speech, drooping face.  Denies visual change. Psychiatric: Denies depression, anxiety. Denies hallucinations.  Past Medical History:  Diagnosis Date  . Arthritis    "hands primarily" (05/05/2016)  . Atrial fibrillation ([place])   . Back pain    age related  . CHF (congestive heart failure) ([place])   . Complication of anesthesia    Bronchial spasms  . COPD (chronic obstructive pulmonary disease) ([facility])    inhalers as needed  . Depression    takes Zoloft daily  . Diabetic peripheral neuropathy ([place]) 01/03/2019  . Dyspnea    with exertion  . Dysrhythmia    A Fib-takes Xarelto daily  . Enlarged prostate    benign  . GERD (gastroesophageal reflux disease)    takes Omeprazole daily  . 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 Hyperlipidemia    takes Atorvastatin daily  . Hypertension    takes Metoprolol,HCTZ,and Losartan daily.Takes Clonidine if needed  . Nocturia   . Peripheral neuropathy    takes Gabapentin daily  . Pneumonia ?2015  . Restless leg   . Sleep apnea  uses Bipap  . Stroke [facility]) 2012   denies residual on 05/05/2016  . Type II diabetes mellitus ([place])    borderline but doesn't take any meds (05/05/2016)    Past Surgical History:  Procedure Laterality Date  . CATARACT EXTRACTION W/ INTRAOCULAR LENS  IMPLANT, BILATERAL Bilateral   . COLONOSCOPY WITH PROPOFOL N/A 03/09/2017   Procedure: COLONOSCOPY WITH PROPOFOL;  Surgeon: Wonda Horner, MD;  Location:  [facility] ENDOSCOPY;  Service: Endoscopy;  Laterality: N/A;  . COLONOSCOPY WITH PROPOFOL N/A 04/03/2020   Procedure: COLONOSCOPY WITH PROPOFOL;  Surgeon: Wonda Horner, MD;  Location: [facility] ENDOSCOPY;  Service: Endoscopy;  Laterality: N/A;  . I & D KNEE WITH POLY EXCHANGE Right 05/04/2016   IRRIGATION AND DEBRIDEMENT Right KNEE WITH UNICOMPARTMENTAL POLY EXCHANGE  . I & D KNEE WITH POLY EXCHANGE Right 05/04/2016   Procedure: IRRIGATION AND DEBRIDEMENT Right KNEE WITH UNICOMPARTMENTAL POLY EXCHANGE;  Surgeon: Renette Butters, MD;  Location: [place];  Service: Orthopedics;  Laterality: Right;  . JOINT REPLACEMENT    . LAPAROSCOPIC CHOLECYSTECTOMY  1995  . PARTIAL KNEE ARTHROPLASTY Right 04/06/2016   Procedure: UNICOMPARTMENTAL RIGHT KNEE;  Surgeon: Renette Butters, MD;  Location: [place];  Service: Orthopedics;  Laterality: Right;  . POLYPECTOMY  04/03/2020   Procedure: POLYPECTOMY;  Surgeon: Wonda Horner, MD;  Location: [facility] ENDOSCOPY;  Service: Endoscopy;;  . STRABISMUS SURGERY Bilateral 06/10/2017   Procedure: REPAIR STRABISMUS BILATERAL;  Surgeon: Everitt Amber, MD;  Location: [place];  Service: Ophthalmology;  Laterality: Bilateral;  . TONSILLECTOMY AND ADENOIDECTOMY    . TOTAL KNEE ARTHROPLASTY Left 2008  . ULNAR NERVE TRANSPOSITION Right 1989    Social History  reports that he quit smoking about 9 years ago. His smoking use included cigarettes. He has a 20.00 pack-year smoking history. He has quit using smokeless tobacco.  His smokeless tobacco use included chew. He reports current alcohol use. He reports that he does not use drugs.  Allergies  Allergen Reactions  . Penicillins Anaphylaxis  . Dymista [Azelastine-Fluticasone] Other (See Comments)    Burning sensation   . Hydrocodone Other (See Comments)    SWEATING  . Spironolactone Other (See Comments)    Makes chest swelling - or as pt puts it "makes his breasts swell and sore"  . Warfarin And Related Other (See Comments)    Pt  states it makes his INR critical levels    Family History  Problem Relation Age of Onset  . CVA Mother   . Liver cancer Father      Prior to Admission medications   Medication Sig Start Date End Date Taking? Authorizing Provider  atorvastatin (LIPITOR) 10 MG tablet Take 10 mg by mouth daily. 02/06/16   [provider]  cloNIDine (CATAPRES) 0.1 MG tablet Take 0.1 mg by mouth daily as needed (Only takes if pt if bp  > 160).    [provider]  cyclobenzaprine (FLEXERIL) 10 MG tablet Take 10 mg by mouth at bedtime as needed for muscle spasms.  01/06/20   [provider]  ENTRESTO 24-26 MG Take 1 tablet by mouth 2 (two) times daily. 03/13/20   [provider]  gabapentin (NEURONTIN) 300 MG capsule Take 900 mg by mouth 2 (two) times daily.     [provider]  metoprolol succinate (TOPROL-XL) 50 MG 24 hr tablet Take 50 mg by mouth daily. Take with or immediately following a meal.    [provider]  omeprazole (PRILOSEC) 20  MG capsule Take 20 mg by mouth daily.    [provider]  oxyCODONE-acetaminophen (ROXICET) 5-325 MG tablet Take 2 tablets by mouth every 4 (four) hours as needed. 04/07/16   Renette Butters, MD  potassium chloride (KLOR-CON) 10 MEQ tablet Take 10 mEq by mouth daily.    [provider]  rivaroxaban (XARELTO) 20 MG TABS tablet Take 20 mg by mouth daily with supper.    [provider]  Semaglutide, 1 MG/DOSE, (OZEMPIC, 1 MG/DOSE,) 4 MG/3ML SOPN Inject 1 mg into the skin every Monday.    [provider]  sertraline (ZOLOFT) 100 MG tablet Take 100 mg by mouth daily.    [provider]  testosterone cypionate (DEPOTESTOSTERONE CYPIONATE) 200 MG/ML injection Inject 100 mg into the muscle every 14 (fourteen) days.  02/13/20   [provider]  torsemide (DEMADEX) 100 MG tablet Take 100 mg by mouth daily. 09/29/18   [provider]    Physical Exam: Vitals:   03/03/21  2000 03/03/21 2006 03/03/21 2030 03/03/21 2100  BP: (!) 141/105 (!) 141/105 (!) 158/108 (!) 153/126  Pulse: 63 73 79 67  Resp: 16 20 17  (!) 26  Temp:      TempSrc:      SpO2: 96% 97% 96% 95%  Weight:      Height:        Constitutional: NAD, calm, comfortable Vitals:   03/03/21 2000 03/03/21 2006 03/03/21 2030 03/03/21 2100  BP: (!) 141/105 (!) 141/105 (!) 158/108 (!) 153/126  Pulse: 63 73 79 67  Resp: 16 20 17  (!) 26  Temp:      TempSrc:      SpO2: 96% 97% 96% 95%  Weight:      Height:       General: WDWN, Alert and oriented x3.  Eyes: EOMI, PERRL,  conjunctivae normal.  Sclera nonicteric HENT:  [place]/AT, external ears normal.  Nares patent without epistasis.  Mucous membranes are moist.  Neck: Soft, normal range of motion, supple, no masses, no thyromegaly.  Trachea midline Respiratory: Equal breath sounds with mild diffuse rales present. no wheezing, no crackles. Normal respiratory effort. No accessory muscle use.  Cardiovascular: Irregularly irregular rhythm with normal rate, no murmurs / rubs / gallops. No extremity edema. 2+ pedal pulses. Abdomen: Soft, no tenderness, nondistended, no rebound or guarding. Obese. No masses palpated. Bowel sounds normoactive Musculoskeletal: LROM of left shoulder due to pain. FROM of right shoulder, arm and bilaterall lower extremities. Has clubbing of digits. no cyanosis. Normal muscle tone.  Skin: Warm, dry, intact no rashes, lesions, ulcers. No induration Neurologic: CN 2-12 grossly intact.  Normal speech. Strength 5/5 in all extremities. Mild decrease in sensation of left hand/fingers which is chronic. Normal grip bilaterally  Psychiatric: Normal judgment and insight.  Normal mood.    Labs on Admission: I have personally reviewed following labs and imaging studies  CBC: Recent Labs  Lab 03/03/21 1900  WBC 12.5*  HGB 15.1  HCT 46.4  MCV 97.9  PLT 983    Basic Metabolic Panel: Recent Labs  Lab 03/03/21 1900  NA 138  K 3.4*  CL  101  CO2 26  GLUCOSE 134*  BUN 28*  CREATININE 0.86  CALCIUM 8.7*    GFR: Estimated Creatinine Clearance: 99.5 mL/min (by C-G formula based on SCr of 0.86 mg/dL).  Liver Function Tests: No results for input(s): AST, ALT, ALKPHOS, BILITOT, PROT, ALBUMIN in the last 168 hours.  Urine analysis:    Component  Value Date/Time   COLORURINE YELLOW 03/25/2016 1022   APPEARANCEUR CLEAR 03/25/2016 1022   LABSPEC 1.011 03/25/2016 1022   PHURINE 6.5 03/25/2016 1022   GLUCOSEU NEGATIVE 03/25/2016 1022   HGBUR NEGATIVE 03/25/2016 1022   BILIRUBINUR NEGATIVE 03/25/2016 [place] 03/25/2016 1022   PROTEINUR NEGATIVE 03/25/2016 1022   NITRITE NEGATIVE 03/25/2016 1022   LEUKOCYTESUR NEGATIVE 03/25/2016 1022    Radiological Exams on Admission: CT SHOULDER LEFT WO CONTRAST  Result Date: 03/03/2021 CLINICAL DATA:  Left shoulder pain and limited range of motion for 3 months since a blow to the shoulder. EXAM: CT OF THE UPPER LEFT EXTREMITY WITHOUT CONTRAST TECHNIQUE: Multidetector CT imaging of the upper left extremity was performed according to the standard protocol. COMPARISON:  None. FINDINGS: Bones/Joint/Cartilage No fracture or dislocation is identified. The patient has moderate acromioclavicular and mild appearing glenohumeral osteoarthritis. Pre acromion type os acromiale is identified. Cystic change and sclerosis in the acromion is identified and likely due to chronic abutment with the humeral head. Glenohumeral joint effusion is identified. Ligaments Suboptimally assessed by CT. Muscles and Tendons Although discrete measurement is difficult, there is a gas containing fluid collection in the subscapularis measuring approximately 6.7 cm transverse by 3 cm AP by 4.2 cm craniocaudal. A second gas containing fluid collection in the anterior aspect of the deltoid measures approximately 6.6 cm craniocaudal by 3 cm transverse by 1.5 cm AP. These collections may communicate. Although  evaluation of the rotator cuff tendons is limited on CT, the supraspinatus and infraspinatus tendons appear completely torn with retraction to nearly the glenohumeral joint. There is severe infraspinatus and mild supraspinatus atrophy. Soft tissues As described above. IMPRESSION: Gas containing fluid collections in the subscapularis and deltoid highly suspicious for pyomyositis. The patient also has a glenohumeral joint effusion which is likely septic given these findings. Recommend MRI of the shoulder with and without contrast for further evaluation and to assess for osteomyelitis. Mild sclerosis and fragmentation of the acromion is likely due to chronic abutment with the humeral head. Evaluation of the rotator cuff is limited on CT but the supraspinatus and infraspinatus appear completely torn and retracted to the glenoid. There is severe infraspinatus and moderate supraspinatus fatty replacement. These results will be called to the ordering clinician or representative by the Radiologist Assistant, and communication documented in the PACS or [employer]. Electronically Signed   By: Inge Rise M.D.   On: 03/03/2021 15:00    EKG: Independently reviewed.  EKG shows atrial fibrillation with normal rate.  Right bundle branch block.  QTc 517  Assessment/Plan Principal Problem:   Septic arthritis of shoulder, left Willie Pineda is admitted to telemetry floor.  Started on vancomycin for antibiotic coverage of septic arthritis and pyomyositis.  Patient with fluid could not collections containing gas on CT scan.  Radiology recommended MRI with and without contrast of his shoulder to evaluate for osteomyelitis.  MRI is ordered. Orthopedic surgery requested patient to be admitted and they will see patient in the morning and plan for surgical intervention after MRI results are known. Pain control provided. IVF hydration with LR at 75 ml/hr overnight.  Check CBC, BMP in am  Active Problems:   Atrial  fibrillation, chronic Chronic.  Stable.  Is anticoagulated with Xarelto    DM (diabetes mellitus), type 2 with neurological complications  Diabetes is diet controlled.  States his last hemoglobin A1c was between 6.5 and 7 in the last 2 months. Reports his blood sugars are normally in  the 130-150 range at home.    Essential hypertension Continue home medication of Entresto and metoprolol.  Monitor blood pressure    COPD (chronic obstructive pulmonary disease)  DuoNebs every 6 hours as needed for cough, shortness of breath, wheeze.  Patient is not on maintenance inhalers at home    Chronic CHF (congestive heart failure)  Patient is on Entresto and Demadex daily.  These will be continued. No symptoms or signs of acute CHF exacerbation or fluid overload    Chronic anticoagulation Patient is on Xarelto for anticoagulation due to atrial fibrillation.  Xarelto was held at surgery request for dated procedure in the next day or 2    Hypokalemia Mildly decreased potassium level 3.4.  Patient is on potassium daily with at home and this will be continued. Recheck electrolytes and renal function in morning    Prolonged QT interval Avoid medications which could further prolong QT interval.  Monitor on telemetry    DVT prophylaxis: Patient is on Xarelto and took his last dose on the morning of March 03, 2021.  Also will be held overnight for anticipated surgery tomorrow per orthopedic surgery request Code Status:   Full code Family Communication:  Diagnosis and plan discussed with patient.  Patient verbalized understanding and agrees with plan.  Further recommendations to follow as clinical indicated Disposition Plan:   Patient is from:  Home  Anticipated DC to:  Home  Anticipated DC date:  Anticipate more than 2 midnight stay in the hospital  Anticipated DC barriers: No barriers to discharge identified at this time  Consults called:  Orthopedic surgery Admission status:   Inpatient  Yevonne Aline Kito Cuffe MD [facility]  How to contact the [facility] Attending or Consulting provider [place] or covering provider during after hours [place], for this patient?   1. Check the care team in [facility] and look for a) attending/consulting [facility] provider listed and b) the [facility] team listed 2. Log into [email or web address] and use [place] universal password to access. If you do not have the password, please contact the hospital operator. 3. Locate the [facility] provider you are looking for under [facility] and page to a number that you can be directly reached. 4. If you still have difficulty reaching the provider, please page the [facility] (Director on Call) for the Hospitalists listed on amion for assistance.  03/03/2021, 9:19 PM

## 2021-03-03 NOTE — ED Provider Notes (Signed)
[facility] DEPT Provider Note   CSN: [id number] Arrival date & time: 03/03/21  1803     History Chief Complaint  Patient presents with  . Shoulder Pain    Willie Pineda is a 75 y.o. male.  Patient is a 75 year old male who presents with shoulder pain.  He has a history of atrial fibrillation on Xarelto, CHF, diabetes, COPD.  He has been having some chronic shoulder pain.  His pain worsened in December after a fall.  He has been seen by Dr. Percell Miller with orthopedics.  He said 2 weeks ago his pain got markedly worse and he started seeing Dr. Griffin Basil.  He had a CT scan done today of his shoulder which shows an abscess/septic joint.  He was sent here for admission.  Surgery is planned for tomorrow for a washout.  He denies any known fevers.  He has been using Tylenol for pain.        Past Medical History:  Diagnosis Date  . Arthritis    "hands primarily" (05/05/2016)  . Atrial fibrillation ([place])   . Back pain    age related  . CHF (congestive heart failure) ([place])   . Complication of anesthesia    Bronchial spasms  . COPD (chronic obstructive pulmonary disease) ([facility])    inhalers as needed  . Depression    takes Zoloft daily  . Diabetic peripheral neuropathy ([place]) 01/03/2019  . Dyspnea    with exertion  . Dysrhythmia    A Fib-takes Xarelto daily  . Enlarged prostate    benign  . GERD (gastroesophageal reflux disease)    takes Omeprazole daily  . 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 Hyperlipidemia    takes Atorvastatin daily  . Hypertension    takes Metoprolol,HCTZ,and Losartan daily.Takes Clonidine if needed  . Nocturia   . Peripheral neuropathy    takes Gabapentin daily  . Pneumonia ?2015  . Restless leg   . Sleep apnea    uses Bipap  . Stroke [facility]) 2012   denies residual on 05/05/2016  . Type II diabetes mellitus ([place])    borderline but doesn't take any meds (05/05/2016)    Patient Active Problem  List   Diagnosis Date Noted  . Diabetic peripheral neuropathy ([place]) 01/03/2019  . Obesity 06/01/2016  . Wound dehiscence 05/04/2016  . Knee osteoarthritis 04/06/2016    Past Surgical History:  Procedure Laterality Date  . CATARACT EXTRACTION W/ INTRAOCULAR LENS  IMPLANT, BILATERAL Bilateral   . COLONOSCOPY WITH PROPOFOL N/A 03/09/2017   Procedure: COLONOSCOPY WITH PROPOFOL;  Surgeon: Wonda Horner, MD;  Location: [facility] ENDOSCOPY;  Service: Endoscopy;  Laterality: N/A;  . COLONOSCOPY WITH PROPOFOL N/A 04/03/2020   Procedure: COLONOSCOPY WITH PROPOFOL;  Surgeon: Wonda Horner, MD;  Location: [facility] ENDOSCOPY;  Service: Endoscopy;  Laterality: N/A;  . I & D KNEE WITH POLY EXCHANGE Right 05/04/2016   IRRIGATION AND DEBRIDEMENT Right KNEE WITH UNICOMPARTMENTAL POLY EXCHANGE  . I & D KNEE WITH POLY EXCHANGE Right 05/04/2016   Procedure: IRRIGATION AND DEBRIDEMENT Right KNEE WITH UNICOMPARTMENTAL POLY EXCHANGE;  Surgeon: Renette Butters, MD;  Location: [facility];  Service: Orthopedics;  Laterality: Right;  . JOINT REPLACEMENT    . LAPAROSCOPIC CHOLECYSTECTOMY  1995  . PARTIAL KNEE ARTHROPLASTY Right 04/06/2016   Procedure: UNICOMPARTMENTAL RIGHT KNEE;  Surgeon: Renette Butters, MD;  Location: [facility];  Service: Orthopedics;  Laterality: Right;  .  POLYPECTOMY  04/03/2020   Procedure: POLYPECTOMY;  Surgeon: Wonda Horner, MD;  Location: [facility] ENDOSCOPY;  Service: Endoscopy;;  . STRABISMUS SURGERY Bilateral 06/10/2017   Procedure: REPAIR STRABISMUS BILATERAL;  Surgeon: Everitt Amber, MD;  Location: [facility];  Service: Ophthalmology;  Laterality: Bilateral;  . TONSILLECTOMY AND ADENOIDECTOMY    . TOTAL KNEE ARTHROPLASTY Left 2008  . ULNAR NERVE TRANSPOSITION Right 1989       Family History  Problem Relation Age of Onset  . CVA Mother   . Liver cancer Father     Social History   Tobacco Use  . Smoking status: Former Smoker    Packs/day: 0.50    Years: 40.00    Pack years: 20.00     Types: Cigarettes    Quit date: 07/15/2011    Years since quitting: 9.6  . Smokeless tobacco: Former Systems developer    Types: Secondary school teacher  . Vaping Use: Never used  Substance Use Topics  . Alcohol use: Yes    Comment: maybe 2 glases wine every other week  . Drug use: No    Home Medications Prior to Admission medications   Medication Sig Start Date End Date Taking? Authorizing Provider  atorvastatin (LIPITOR) 10 MG tablet Take 10 mg by mouth daily. 02/06/16   [provider]  cloNIDine (CATAPRES) 0.1 MG tablet Take 0.1 mg by mouth daily as needed (Only takes if pt if bp  > 160).    [provider]  cyclobenzaprine (FLEXERIL) 10 MG tablet Take 10 mg by mouth at bedtime as needed for muscle spasms.  01/06/20   [provider]  ENTRESTO 24-26 MG Take 1 tablet by mouth 2 (two) times daily. 03/13/20   [provider]  gabapentin (NEURONTIN) 300 MG capsule Take 900 mg by mouth 2 (two) times daily.     [provider]  metoprolol succinate (TOPROL-XL) 50 MG 24 hr tablet Take 50 mg by mouth daily. Take with or immediately following a meal.    [provider]  omeprazole (PRILOSEC) 20 MG capsule Take 20 mg by mouth daily.    [provider]  oxyCODONE-acetaminophen (ROXICET) 5-325 MG tablet Take 2 tablets by mouth every 4 (four) hours as needed. 04/07/16   Renette Butters, MD  potassium chloride (KLOR-CON) 10 MEQ tablet Take 10 mEq by mouth daily.    [provider]  rivaroxaban (XARELTO) 20 MG TABS tablet Take 20 mg by mouth daily with supper.    [provider]  Semaglutide, 1 MG/DOSE, (OZEMPIC, 1 MG/DOSE,) 4 MG/3ML SOPN Inject 1 mg into the skin every Monday.    [provider]  sertraline (ZOLOFT) 100 MG tablet Take 100 mg by mouth daily.    [provider]  testosterone cypionate (DEPOTESTOSTERONE CYPIONATE) 200 MG/ML injection Inject 100 mg into the muscle every 14 (fourteen) days.  02/13/20   [provider]  torsemide (DEMADEX) 100 MG tablet Take 100 mg by mouth daily. 09/29/18   [provider]    Allergies    Penicillins, Dymista [azelastine-fluticasone], Hydrocodone, Spironolactone, and Warfarin and related  Review of Systems   Review of Systems  Constitutional: Negative for chills, diaphoresis, fatigue and fever.  HENT: Negative for congestion, rhinorrhea and sneezing.   Eyes: Negative.   Respiratory: Negative for cough, chest tightness and shortness of breath.   Cardiovascular: Negative for chest pain and leg swelling.  Gastrointestinal: Negative for abdominal pain, blood in stool, diarrhea, nausea and vomiting.  Genitourinary: Negative for difficulty urinating, flank pain, frequency and hematuria.  Musculoskeletal: Positive for arthralgias. Negative for back pain.  Skin: Positive for wound (Healing wound to left foot). Negative for rash.  Neurological: Negative for dizziness, speech difficulty, weakness, numbness and headaches.    Physical Exam Updated Vital Signs BP (!) 141/105 (BP Location: Right Arm)   Pulse 73   Temp 98.3 F (36.8 C) (Oral)   Resp 20   Ht 6' (1.829 m)   Wt 120.7 kg   SpO2 97%   BMI 36.08 kg/m   Physical Exam Constitutional:      Appearance: He is well-developed.  HENT:     Head: Normocephalic and atraumatic.  Eyes:     Pupils: Pupils are equal, round, and reactive to light.  Cardiovascular:     Rate and Rhythm: Normal rate and regular rhythm.     Heart sounds: Normal heart sounds.  Pulmonary:     Effort: Pulmonary effort is normal. No respiratory distress.     Breath sounds: Normal breath sounds. No wheezing or rales.  Chest:     Chest wall: No tenderness.  Abdominal:     General: Bowel sounds are normal.     Palpations: Abdomen is soft.     Tenderness: There is no abdominal tenderness. There is no guarding or rebound.  Musculoskeletal:        General: Normal range of motion.     Cervical back: Normal range of  motion and neck supple.     Comments: Positive pain on palpation range of motion of the left shoulder.  There is some mild swelling to the shoulder.  He has some baseline numbness over his left fifth digit but no new changes or numbness.  Radial pulses are intact.  Lymphadenopathy:     Cervical: No cervical adenopathy.  Skin:    General: Skin is warm and dry.     Findings: No rash.  Neurological:     Mental Status: He is alert and oriented to person, place, and time.     ED Results / Procedures / Treatments   Labs (all labs ordered are listed, but only abnormal results are displayed) Labs Reviewed  CBC - Abnormal; Notable for the following components:      Result Value   WBC 12.5 (*)    All other components within normal limits  BASIC METABOLIC PANEL - Abnormal; Notable for the following components:   Potassium 3.4 (*)    Glucose, Bld 134 (*)    BUN 28 (*)    Calcium 8.7 (*)    All other components within normal limits  RESP PANEL BY RT-PCR (FLU A&B, COVID) ARPGX2    EKG None  Radiology CT SHOULDER LEFT WO CONTRAST  Result Date: 03/03/2021 CLINICAL DATA:  Left shoulder pain and limited range of motion for 3 months since a blow to the shoulder. EXAM: CT OF THE UPPER LEFT EXTREMITY WITHOUT CONTRAST TECHNIQUE: Multidetector CT imaging of the upper left extremity was performed according to the standard protocol. COMPARISON:  None. FINDINGS: Bones/Joint/Cartilage No fracture or dislocation is identified. The patient has moderate acromioclavicular and mild appearing glenohumeral osteoarthritis. Pre acromion type os acromiale is identified. Cystic change and sclerosis in the acromion is identified and likely due to chronic abutment with the humeral head. Glenohumeral joint effusion is identified. Ligaments Suboptimally assessed by CT. Muscles and Tendons Although discrete measurement is difficult, there is a gas containing fluid collection in the subscapularis measuring approximately 6.7  cm transverse  by 3 cm AP by 4.2 cm craniocaudal. A second gas containing fluid collection in the anterior aspect of the deltoid measures approximately 6.6 cm craniocaudal by 3 cm transverse by 1.5 cm AP. These collections may communicate. Although evaluation of the rotator cuff tendons is limited on CT, the supraspinatus and infraspinatus tendons appear completely torn with retraction to nearly the glenohumeral joint. There is severe infraspinatus and mild supraspinatus atrophy. Soft tissues As described above. IMPRESSION: Gas containing fluid collections in the subscapularis and deltoid highly suspicious for pyomyositis. The patient also has a glenohumeral joint effusion which is likely septic given these findings. Recommend MRI of the shoulder with and without contrast for further evaluation and to assess for osteomyelitis. Mild sclerosis and fragmentation of the acromion is likely due to chronic abutment with the humeral head. Evaluation of the rotator cuff is limited on CT but the supraspinatus and infraspinatus appear completely torn and retracted to the glenoid. There is severe infraspinatus and moderate supraspinatus fatty replacement. These results will be called to the ordering clinician or representative by the Radiologist Assistant, and communication documented in the PACS or [employer]. Electronically Signed   By: Inge Rise M.D.   On: 03/03/2021 15:00    Procedures Procedures   Medications Ordered in ED Medications  vancomycin (VANCOCIN) IVPB 1000 mg/200 mL premix (has no administration in time range)    ED Course  I have reviewed the triage vital signs and the nursing notes.  Pertinent labs & imaging results that were available during my care of the patient were reviewed by me and considered in my medical decision making (see chart for details).    MDM Rules/Calculators/A&P                          Patient is a 75 year old male who presents with known septic  arthritis/abscess of his left shoulder that was found on a CT scan earlier today.  His labs show an elevated WBC count.  His blood sugar is mildly elevated.  He is afebrile and otherwise well-appearing.  He was started on vancomycin.  I spoke with Dr. Tonie Griffith who will admit the patient and surgery is planned for tomorrow for joint washout.  Of note he is on Xarelto and his last dose was this morning. Final Clinical Impression(s) / ED Diagnoses Final diagnoses:  Pyogenic arthritis of left shoulder region, due to unspecified organism [facility])    Rx / [place] Orders ED Discharge Orders    None       Malvin Johns, MD 03/03/21 2035

## 2021-03-03 NOTE — ED Notes (Signed)
Patient transported to MRI 

## 2021-03-03 NOTE — ED Triage Notes (Addendum)
Pt reports abscess on rotator cuff of left shoulder per CT scan he had yesterday. Sent here by PCP. Denies fevers, shob, N/V/D.

## 2021-03-03 NOTE — Progress Notes (Signed)
Discussed patient with hospitalist team on call. 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 Plan for hospitalist admission for a septic shoulder with IV antibiotic's and management. Would take to the operating room if appropriate tomorrow at 2 PM approximately at [facility] for washout. On-call orthopedic team for sports medicine are aware of the plan as above.

## 2021-03-04 ENCOUNTER — Encounter (HOSPITAL_COMMUNITY)
Admission: EM | Disposition: A | Discharge status: Home / Self Care | Payer: Self-pay | Source: Home / Self Care | Attending: Family Medicine

## 2021-03-04 ENCOUNTER — Inpatient Hospital Stay (HOSPITAL_COMMUNITY): Payer: Medicare Other | Admitting: Anesthesiology

## 2021-03-04 ENCOUNTER — Inpatient Hospital Stay (HOSPITAL_COMMUNITY): Payer: Medicare Other

## 2021-03-04 ENCOUNTER — Ambulatory Visit: Admission: RE | Admit: 2021-03-04 | Payer: Medicare Other | Source: Home / Self Care | Admitting: Orthopaedic Surgery

## 2021-03-04 ENCOUNTER — Encounter (HOSPITAL_COMMUNITY): Payer: Self-pay | Admitting: Family Medicine

## 2021-03-04 DIAGNOSIS — R9431 Abnormal electrocardiogram [ECG] [EKG]: Secondary | ICD-10-CM

## 2021-03-04 DIAGNOSIS — I5032 Chronic diastolic (congestive) heart failure: Secondary | ICD-10-CM | POA: Diagnosis not present

## 2021-03-04 DIAGNOSIS — Z96653 Presence of artificial knee joint, bilateral: Secondary | ICD-10-CM

## 2021-03-04 DIAGNOSIS — Z7901 Long term (current) use of anticoagulants: Secondary | ICD-10-CM | POA: Diagnosis not present

## 2021-03-04 DIAGNOSIS — E876 Hypokalemia: Secondary | ICD-10-CM

## 2021-03-04 DIAGNOSIS — M8660 Other chronic osteomyelitis, unspecified site: Secondary | ICD-10-CM

## 2021-03-04 DIAGNOSIS — I482 Chronic atrial fibrillation, unspecified: Secondary | ICD-10-CM

## 2021-03-04 DIAGNOSIS — M009 Pyogenic arthritis, unspecified: Secondary | ICD-10-CM | POA: Diagnosis not present

## 2021-03-04 DIAGNOSIS — L089 Local infection of the skin and subcutaneous tissue, unspecified: Secondary | ICD-10-CM | POA: Diagnosis not present

## 2021-03-04 DIAGNOSIS — E11628 Type 2 diabetes mellitus with other skin complications: Secondary | ICD-10-CM

## 2021-03-04 DIAGNOSIS — I10 Essential (primary) hypertension: Secondary | ICD-10-CM

## 2021-03-04 DIAGNOSIS — E1149 Type 2 diabetes mellitus with other diabetic neurological complication: Secondary | ICD-10-CM

## 2021-03-04 HISTORY — PX: TOTAL SHOULDER ARTHROPLASTY: SHX126

## 2021-03-04 LAB — SURGICAL PCR SCREEN
MRSA, PCR: NEGATIVE
Staphylococcus aureus: NEGATIVE

## 2021-03-04 LAB — GLUCOSE, CAPILLARY: Glucose-Capillary: 112 mg/dL — ABNORMAL HIGH (ref 70–99)

## 2021-03-04 LAB — BASIC METABOLIC PANEL
Anion gap: 10 (ref 5–15)
BUN: 28 mg/dL — ABNORMAL HIGH (ref 8–23)
CO2: 27 mmol/L (ref 22–32)
Calcium: 8.7 mg/dL — ABNORMAL LOW (ref 8.9–10.3)
Chloride: 100 mmol/L (ref 98–111)
Creatinine, Ser: 0.92 mg/dL (ref 0.61–1.24)
GFR, Estimated: >60 mL/min (ref >60)
Glucose, Bld: 108 mg/dL — ABNORMAL HIGH (ref 70–99)
Potassium: 3.5 mmol/L (ref 3.5–5.1)
Sodium: 137 mmol/L (ref 135–145)

## 2021-03-04 LAB — CBC
HCT: 42.1 % (ref 39.0–52.0)
Hemoglobin: 13.8 g/dL (ref 13.0–17.0)
MCH: 31.9 pg (ref 26.0–34.0)
MCHC: 32.8 g/dL (ref 30.0–36.0)
MCV: 97.5 fL (ref 80.0–100.0)
Platelets: 340 10*3/uL (ref 150–400)
RBC: 4.32 MIL/uL (ref 4.22–5.81)
RDW: 12.5 % (ref 11.5–15.5)
WBC: 12.1 10*3/uL — ABNORMAL HIGH (ref 4.0–10.5)
nRBC: 0 % (ref 0.0–0.2)

## 2021-03-04 SURGERY — ARTHROPLASTY, SHOULDER, TOTAL
Anesthesia: General | Site: Shoulder | Laterality: Left

## 2021-03-04 MED ORDER — FENTANYL CITRATE (PF) 100 MCG/2ML IJ SOLN
INTRAMUSCULAR | Status: AC
  Filled 2021-03-04: qty 2

## 2021-03-04 MED ORDER — PROPOFOL 10 MG/ML IV BOLUS
INTRAVENOUS | Status: AC
  Filled 2021-03-04: qty 20

## 2021-03-04 MED ORDER — LACTATED RINGERS IV SOLN: INTRAVENOUS | Status: DC

## 2021-03-04 MED ORDER — ROCURONIUM BROMIDE 10 MG/ML (PF) SYRINGE
PREFILLED_SYRINGE | INTRAVENOUS | Status: DC | PRN
  Administered 2021-03-04: 20 mg via INTRAVENOUS
  Administered 2021-03-04: 80 mg via INTRAVENOUS

## 2021-03-04 MED ORDER — PHENYLEPHRINE HCL-NACL 10-0.9 MG/250ML-% IV SOLN
INTRAVENOUS | Status: DC | PRN
  Administered 2021-03-04: 50 ug/min via INTRAVENOUS

## 2021-03-04 MED ORDER — VANCOMYCIN HCL 1000 MG IV SOLR
INTRAVENOUS | Status: AC
  Filled 2021-03-04: qty 1000

## 2021-03-04 MED ORDER — MIDAZOLAM HCL 2 MG/2ML IJ SOLN: 1.0000 mg | INTRAMUSCULAR | Status: DC

## 2021-03-04 MED ORDER — LIDOCAINE 2% (20 MG/ML) 5 ML SYRINGE
INTRAMUSCULAR | Status: DC | PRN
  Administered 2021-03-04: 100 mg via INTRAVENOUS

## 2021-03-04 MED ORDER — GABAPENTIN 300 MG PO CAPS
900.0000 mg | ORAL_CAPSULE | Freq: Two times a day (BID) | ORAL | Status: DC
  Administered 2021-03-04 – 2021-03-06 (×5): 900 mg via ORAL
  Filled 2021-03-04 (×5): qty 3

## 2021-03-04 MED ORDER — SERTRALINE HCL 100 MG PO TABS
100.0000 mg | ORAL_TABLET | Freq: Every day | ORAL | Status: DC
  Administered 2021-03-05 – 2021-03-06 (×2): 100 mg via ORAL
  Filled 2021-03-04 (×2): qty 1

## 2021-03-04 MED ORDER — METHOCARBAMOL 500 MG IVPB - SIMPLE MED
INTRAVENOUS | Status: AC
  Filled 2021-03-04: qty 50

## 2021-03-04 MED ORDER — METOPROLOL SUCCINATE ER 50 MG PO TB24
50.0000 mg | ORAL_TABLET | Freq: Every day | ORAL | Status: DC
Start: 2021-03-04 — End: 2021-03-06
  Administered 2021-03-04 – 2021-03-06 (×3): 50 mg via ORAL
  Filled 2021-03-04 (×3): qty 1

## 2021-03-04 MED ORDER — EPHEDRINE SULFATE-NACL 50-0.9 MG/10ML-% IV SOSY
PREFILLED_SYRINGE | INTRAVENOUS | Status: DC | PRN
  Administered 2021-03-04 (×2): 10 mg via INTRAVENOUS

## 2021-03-04 MED ORDER — MAGNESIUM CITRATE PO SOLN: 1.0000 | Freq: Once | ORAL | Status: DC | PRN

## 2021-03-04 MED ORDER — DIPHENHYDRAMINE HCL 12.5 MG/5ML PO ELIX: 12.5000 mg | ORAL_SOLUTION | ORAL | Status: DC | PRN

## 2021-03-04 MED ORDER — METHOCARBAMOL 500 MG IVPB - SIMPLE MED
500.0000 mg | Freq: Four times a day (QID) | INTRAVENOUS | Status: DC | PRN
  Administered 2021-03-04: 500 mg via INTRAVENOUS
  Filled 2021-03-04: qty 50

## 2021-03-04 MED ORDER — BUPIVACAINE LIPOSOME 1.3 % IJ SUSP
INTRAMUSCULAR | Status: DC | PRN
  Administered 2021-03-04: 10 mL via PERINEURAL

## 2021-03-04 MED ORDER — PHENYLEPHRINE 40 MCG/ML (10ML) SYRINGE FOR IV PUSH (FOR BLOOD PRESSURE SUPPORT)
PREFILLED_SYRINGE | INTRAVENOUS | Status: DC | PRN
  Administered 2021-03-04 (×3): 120 ug via INTRAVENOUS

## 2021-03-04 MED ORDER — METRONIDAZOLE IN NACL 5-0.79 MG/ML-% IV SOLN
500.0000 mg | Freq: Three times a day (TID) | INTRAVENOUS | Status: DC
  Administered 2021-03-04 – 2021-03-06 (×5): 500 mg via INTRAVENOUS
  Filled 2021-03-04 (×6): qty 100

## 2021-03-04 MED ORDER — VANCOMYCIN HCL 1500 MG/300ML IV SOLN
1500.0000 mg | INTRAVENOUS | Status: AC
  Filled 2021-03-04: qty 300

## 2021-03-04 MED ORDER — SODIUM CHLORIDE 0.9 % IR SOLN
Status: DC | PRN
  Administered 2021-03-04: 6000 mL

## 2021-03-04 MED ORDER — ATORVASTATIN CALCIUM 10 MG PO TABS
10.0000 mg | ORAL_TABLET | Freq: Every day | ORAL | Status: DC
  Filled 2021-03-04 (×2): qty 1

## 2021-03-04 MED ORDER — PHENYLEPHRINE 40 MCG/ML (10ML) SYRINGE FOR IV PUSH (FOR BLOOD PRESSURE SUPPORT)
PREFILLED_SYRINGE | INTRAVENOUS | Status: AC
  Filled 2021-03-04: qty 10

## 2021-03-04 MED ORDER — POTASSIUM CHLORIDE CRYS ER 10 MEQ PO TBCR
10.0000 meq | EXTENDED_RELEASE_TABLET | Freq: Every day | ORAL | Status: DC
  Administered 2021-03-05 – 2021-03-06 (×2): 10 meq via ORAL
  Filled 2021-03-04 (×2): qty 1

## 2021-03-04 MED ORDER — ACETAMINOPHEN 500 MG PO TABS
1000.0000 mg | ORAL_TABLET | Freq: Three times a day (TID) | ORAL | Status: AC
  Administered 2021-03-04 – 2021-03-05 (×4): 1000 mg via ORAL
  Filled 2021-03-04 (×4): qty 2

## 2021-03-04 MED ORDER — FENTANYL CITRATE (PF) 100 MCG/2ML IJ SOLN: 50.0000 ug | INTRAMUSCULAR | Status: DC

## 2021-03-04 MED ORDER — PHENOL 1.4 % MT LIQD: 1.0000 | OROMUCOSAL | Status: DC | PRN

## 2021-03-04 MED ORDER — RIVAROXABAN 20 MG PO TABS
20.0000 mg | ORAL_TABLET | Freq: Every day | ORAL | Status: DC
  Administered 2021-03-05 – 2021-03-06 (×2): 20 mg via ORAL
  Filled 2021-03-04 (×3): qty 1

## 2021-03-04 MED ORDER — SACUBITRIL-VALSARTAN 24-26 MG PO TABS
1.0000 | ORAL_TABLET | Freq: Two times a day (BID) | ORAL | Status: DC
  Administered 2021-03-04 – 2021-03-06 (×4): 1 via ORAL
  Filled 2021-03-04 (×5): qty 1

## 2021-03-04 MED ORDER — PROPOFOL 10 MG/ML IV BOLUS
INTRAVENOUS | Status: DC | PRN
  Administered 2021-03-04: 50 mg via INTRAVENOUS
  Administered 2021-03-04: 150 mg via INTRAVENOUS

## 2021-03-04 MED ORDER — PHENYLEPHRINE HCL (PRESSORS) 10 MG/ML IV SOLN
INTRAVENOUS | Status: AC
  Filled 2021-03-04: qty 1

## 2021-03-04 MED ORDER — ACETAMINOPHEN 500 MG PO TABS
ORAL_TABLET | ORAL | Status: AC
  Filled 2021-03-04: qty 2

## 2021-03-04 MED ORDER — VANCOMYCIN HCL 1000 MG IV SOLR
INTRAVENOUS | Status: DC | PRN
  Administered 2021-03-04: 1000 mg

## 2021-03-04 MED ORDER — CHLORHEXIDINE GLUCONATE 0.12 % MT SOLN
15.0000 mL | Freq: Once | OROMUCOSAL | Status: AC
  Administered 2021-03-04: 15 mL via OROMUCOSAL

## 2021-03-04 MED ORDER — SODIUM CHLORIDE 0.9 % IV SOLN
2.0000 g | Freq: Three times a day (TID) | INTRAVENOUS | Status: DC
  Administered 2021-03-04 – 2021-03-06 (×5): 2 g via INTRAVENOUS
  Filled 2021-03-04 (×8): qty 2

## 2021-03-04 MED ORDER — MIDAZOLAM HCL 2 MG/2ML IJ SOLN
INTRAMUSCULAR | Status: AC
  Administered 2021-03-04: 1 mg via INTRAVENOUS
  Filled 2021-03-04: qty 2

## 2021-03-04 MED ORDER — SODIUM CHLORIDE 0.9 % IV SOLN
2.0000 g | Freq: Three times a day (TID) | INTRAVENOUS | Status: DC
  Administered 2021-03-04: 2 g via INTRAVENOUS
  Filled 2021-03-04 (×5): qty 2

## 2021-03-04 MED ORDER — BISACODYL 10 MG RE SUPP: 10.0000 mg | Freq: Every day | RECTAL | Status: DC | PRN

## 2021-03-04 MED ORDER — BUPIVACAINE HCL (PF) 0.5 % IJ SOLN
INTRAMUSCULAR | Status: DC | PRN
  Administered 2021-03-04: 14 mL

## 2021-03-04 MED ORDER — MENTHOL 3 MG MT LOZG: 1.0000 | LOZENGE | OROMUCOSAL | Status: DC | PRN

## 2021-03-04 MED ORDER — ONDANSETRON HCL 4 MG/2ML IJ SOLN
INTRAMUSCULAR | Status: DC | PRN
  Administered 2021-03-04: 4 mg via INTRAVENOUS

## 2021-03-04 MED ORDER — FENTANYL CITRATE (PF) 250 MCG/5ML IJ SOLN
INTRAMUSCULAR | Status: DC | PRN
  Administered 2021-03-04 (×4): 50 ug via INTRAVENOUS

## 2021-03-04 MED ORDER — OXYCODONE HCL 5 MG PO TABS: 5.0000 mg | ORAL_TABLET | Freq: Four times a day (QID) | ORAL | Status: DC | PRN

## 2021-03-04 MED ORDER — EPHEDRINE 5 MG/ML INJ
INTRAVENOUS | Status: AC
  Filled 2021-03-04: qty 10

## 2021-03-04 MED ORDER — SUGAMMADEX SODIUM 500 MG/5ML IV SOLN
INTRAVENOUS | Status: DC | PRN
  Administered 2021-03-04: 300 mg via INTRAVENOUS

## 2021-03-04 MED ORDER — MINERAL OIL LIGHT OIL
TOPICAL_OIL | Status: AC
  Filled 2021-03-04: qty 10

## 2021-03-04 MED ORDER — TORSEMIDE 100 MG PO TABS
100.0000 mg | ORAL_TABLET | Freq: Every day | ORAL | Status: DC
  Administered 2021-03-05 – 2021-03-06 (×2): 100 mg via ORAL
  Filled 2021-03-04 (×3): qty 1

## 2021-03-04 MED ORDER — METHOCARBAMOL 500 MG PO TABS
500.0000 mg | ORAL_TABLET | Freq: Four times a day (QID) | ORAL | Status: DC | PRN
  Administered 2021-03-05: 500 mg via ORAL
  Filled 2021-03-04: qty 1

## 2021-03-04 MED ORDER — ACETAMINOPHEN 500 MG PO TABS
1000.0000 mg | ORAL_TABLET | Freq: Once | ORAL | Status: AC
  Administered 2021-03-04: 1000 mg via ORAL

## 2021-03-04 MED ORDER — ROCURONIUM BROMIDE 10 MG/ML (PF) SYRINGE
PREFILLED_SYRINGE | INTRAVENOUS | Status: AC
  Filled 2021-03-04: qty 10

## 2021-03-04 MED ORDER — LIDOCAINE 2% (20 MG/ML) 5 ML SYRINGE
INTRAMUSCULAR | Status: AC
  Filled 2021-03-04: qty 5

## 2021-03-04 MED ORDER — DOCUSATE SODIUM 100 MG PO CAPS
100.0000 mg | ORAL_CAPSULE | Freq: Two times a day (BID) | ORAL | Status: DC
  Administered 2021-03-04 – 2021-03-05 (×2): 100 mg via ORAL
  Filled 2021-03-04 (×3): qty 1

## 2021-03-04 MED ORDER — POLYETHYLENE GLYCOL 3350 17 G PO PACK
17.0000 g | PACK | Freq: Every day | ORAL | Status: DC | PRN
Start: 2021-03-04 — End: 2021-03-06

## 2021-03-04 MED ORDER — SUGAMMADEX SODIUM 500 MG/5ML IV SOLN
INTRAVENOUS | Status: AC
  Filled 2021-03-04: qty 5

## 2021-03-04 SURGICAL SUPPLY — 57 items
BLADE OSCILLATING/SAGITTAL (BLADE) ×2
BLADE SAW SAG 73X25 THK (BLADE) ×1
BLADE SAW SGTL 73X25 THK (BLADE) ×2 IMPLANT
BLADE SW THK.38XMED LNG THN (BLADE) ×4 IMPLANT
BONE CEMENT GENTAMICIN (Cement) ×6 IMPLANT
CANISTER WOUNDNEG PRESSURE 500 (CANNISTER) ×3 IMPLANT
CEMENT BONE GENTAMICIN 40 (Cement) ×4 IMPLANT
CHLORAPREP W/TINT 26 (MISCELLANEOUS) ×6 IMPLANT
CNTNR URN SCR LID CUP LEK RST (MISCELLANEOUS) ×8 IMPLANT
CONT SPEC 4OZ STRL OR WHT (MISCELLANEOUS) ×4
COVER BACK TABLE 60X90IN (DRAPES) ×3 IMPLANT
COVER SURGICAL LIGHT HANDLE (MISCELLANEOUS) ×3 IMPLANT
DRAPE INCISE IOBAN 66X45 STRL (DRAPES) ×6 IMPLANT
DRAPE ORTHO SPLIT 77X108 STRL (DRAPES) ×4
DRAPE SHEET LG 3/4 BI-LAMINATE (DRAPES) ×3 IMPLANT
DRAPE SURG ORHT 6 SPLT 77X108 (DRAPES) ×8 IMPLANT
DRAPE TOP 10253 STERILE (DRAPES) ×3 IMPLANT
ELECT BLADE TIP CTD 4 INCH (ELECTRODE) ×3 IMPLANT
ELECT REM PT RETURN 15FT ADLT (MISCELLANEOUS) ×3 IMPLANT
GLOVE SRG 8 PF TXTR STRL LF DI (GLOVE) ×4 IMPLANT
GLOVE SURG ENC MOIS LTX SZ6.5 (GLOVE) ×12 IMPLANT
GLOVE SURG LTX SZ8 (GLOVE) ×9 IMPLANT
GLOVE SURG UNDER LTX SZ6.5 (GLOVE) ×3 IMPLANT
GLOVE SURG UNDER POLY LF SZ6.5 (GLOVE) ×3 IMPLANT
GLOVE SURG UNDER POLY LF SZ8 (GLOVE) ×2
GOWN STRL REUS W/ TWL LRG LVL3 (GOWN DISPOSABLE) ×4 IMPLANT
GOWN STRL REUS W/TWL LRG LVL3 (GOWN DISPOSABLE) ×8 IMPLANT
HANDPIECE INTERPULSE COAX TIP (DISPOSABLE) ×1
IV NS IRRIG 3000ML ARTHROMATIC (IV SOLUTION) ×12 IMPLANT
KIT BASIN OR (CUSTOM PROCEDURE TRAY) ×6 IMPLANT
KIT DRSG PREVENA PLUS 7DAY 125 (MISCELLANEOUS) ×3 IMPLANT
KIT POSITIONER SHLDR ASSISTARM (MISCELLANEOUS) ×3 IMPLANT
KIT STABILIZATION SHOULDER (MISCELLANEOUS) ×3 IMPLANT
KIT TURNOVER KIT A (KITS) ×3 IMPLANT
MANIFOLD NEPTUNE II (INSTRUMENTS) ×3 IMPLANT
NEEDLE SCORPION MULTI FIRE (NEEDLE) IMPLANT
NS IRRIG 1000ML POUR BTL (IV SOLUTION) ×3 IMPLANT
PACK ARTHROSCOPY WL (CUSTOM PROCEDURE TRAY) ×3 IMPLANT
PACK SHOULDER (CUSTOM PROCEDURE TRAY) ×3 IMPLANT
PORT APPOLLO RF 90DEGREE MULTI (SURGICAL WAND) ×3 IMPLANT
RESTRAINT HEAD UNIVERSAL NS (MISCELLANEOUS) ×3 IMPLANT
SET HNDPC FAN SPRY TIP SCT (DISPOSABLE) ×2 IMPLANT
SLING ARM FOAM STRAP LRG (SOFTGOODS) ×3 IMPLANT
SLING ARM FOAM STRAP XLG (SOFTGOODS) IMPLANT
SLING ARM IMMOBILIZER MED (SOFTGOODS) IMPLANT
SLING ULTRA II L (ORTHOPEDIC SUPPLIES) ×3 IMPLANT
SMARTMIX MINI TOWER (MISCELLANEOUS) ×3
SPONGE LAP 18X18 RF (DISPOSABLE) ×3 IMPLANT
SPONGE LAP 4X18 RFD (DISPOSABLE) ×3 IMPLANT
SUCTION FRAZIER HANDLE 12FR (TUBING) ×1
SUCTION TUBE FRAZIER 12FR DISP (TUBING) ×2 IMPLANT
SUT ETHIBOND 2 V 37 (SUTURE) ×3 IMPLANT
SUT ETHILON 2 0 PS N (SUTURE) ×6 IMPLANT
SUT PDS AB 2-0 CT2 27 (SUTURE) ×6 IMPLANT
TOWEL OR 17X26 10 PK STRL BLUE (TOWEL DISPOSABLE) ×6 IMPLANT
TOWER SMARTMIX MINI (MISCELLANEOUS) ×2 IMPLANT
WATER STERILE IRR 1000ML POUR (IV SOLUTION) ×6 IMPLANT

## 2021-03-04 NOTE — Anesthesia Procedure Notes (Signed)
Procedure Name: Intubation Date/Time: 03/04/2021 2:40 PM Performed by: Lollie Sails, CRNA Pre-anesthesia Checklist: Patient identified, Emergency Drugs available, Suction available, Patient being monitored and Timeout performed Patient Re-evaluated:Patient Re-evaluated prior to induction Oxygen Delivery Method: Circle system utilized Preoxygenation: Pre-oxygenation with 100% oxygen Induction Type: IV induction Ventilation: Mask ventilation without difficulty and Oral airway inserted - appropriate to patient size Laryngoscope Size: Miller and 3 Grade View: Grade II Tube type: Oral Tube size: 8.0 mm Number of attempts: 1 Airway Equipment and Method: Stylet Placement Confirmation: ETT inserted through vocal cords under direct vision,  positive ETCO2 and breath sounds checked- equal and bilateral Secured at: 24 cm Tube secured with: Tape Dental Injury: Teeth and Oropharynx as per pre-operative assessment

## 2021-03-04 NOTE — Consult Note (Signed)
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 abx for the left shoulder process should cover this. He had no previous foot imaging that I can see but underwent soft tissue I&D on 3/17. The ulcer had almost healed up.  Given the recent doxycycline course, the culture for the left shoulder might be negative. But will see   Plan: 1. Discussed with ortho - asked to send afb, fungal, aerobic/anaerobic culture 2. Will send for quantiferon gold, serial crp, and hiv screen as well 3. Can start doxycycline 100 mg po bid and levofloxacin 750 mg po daily after surgery 4. No picc for now 5. Will arrange ID f/u  Principal Problem:   Septic arthritis of shoulder, left (HCC) Active Problems:   Atrial fibrillation, chronic (HCC)   Chronic anticoagulation   DM (diabetes mellitus), type 2 with neurological complications (HCC)   Essential hypertension   Hypokalemia   Prolonged QT interval   COPD (chronic obstructive pulmonary  disease) (HCC)   Chronic CHF (congestive heart failure) (HCC)   Scheduled Meds: . atorvastatin  10 mg Oral Daily  . gabapentin  900 mg Oral BID  . metoprolol succinate  50 mg Oral Daily  . potassium chloride  10 mEq Oral Daily  . sacubitril-valsartan  1 tablet Oral BID  . sertraline  100 mg Oral Daily  . torsemide  100 mg Oral Daily   Continuous Infusions: . lactated ringers 75 mL/hr at 03/04/21 0025  . vancomycin Stopped (03/04/21 0804)  . 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 Operative culture negative. Has done well since without any pain or hardware failure  Patient fell a year ago training his horse and had left shoulder pain since. There has been some improvement but the past month pain much worse. He take tylenol twice a day for other body aches/pain and hasn't noticed any fever  He was seen by ortho outpatient who had ct 3/29 showing abcess/septic arthritis  of left shoulder. Patient is admitted for I&D with plan for future joint replacement  He had left 2nd toe diabetic foot ulcer recently (3/17) debrided in the podiatry office (unclear if cx sent) and given 10 day doxy. The ulcer had closed and there is no pain/swelling/discharge there  He is allergic to penicillin. Recall given IM pcn in his  teens and then he was "out." doesn't recall rash/abd pain or specifics. Has taken cephalexin and other cephalosporin.     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 The left 2nd toe ulcer occurred after acute on chronic onset left shoulder pain   Review of Systems: ROS Other ros negative  Past Medical History:  Diagnosis Date  . Arthritis    "hands primarily" (05/05/2016)  . Atrial fibrillation ([place])   . Back pain    age related  . CHF (congestive heart failure) ([place])   . Complication of anesthesia    Bronchial spasms  . COPD (chronic obstructive pulmonary disease) (HCC)    inhalers as needed  . Depression    takes Zoloft daily  . Diabetic peripheral neuropathy ([place]) 01/03/2019  . Dyspnea    with exertion  . Dysrhythmia    A Fib-takes Xarelto daily  . Enlarged prostate    benign  . GERD (gastroesophageal reflux disease)    takes Omeprazole daily  . 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 Hyperlipidemia    takes Atorvastatin daily  . Hypertension    takes Metoprolol,HCTZ,and Losartan daily.Takes Clonidine if needed  . Nocturia   . Peripheral neuropathy    takes Gabapentin daily  . Pneumonia ?2015  . Restless leg   . Sleep apnea    uses Bipap  . Stroke [facility]) 2012   denies residual on 05/05/2016  . Type II diabetes mellitus ([place])    borderline but doesn't take any meds (05/05/2016)    Social History   Tobacco Use  . Smoking status: Former Smoker    Packs/day: 0.50    Years: 40.00    Pack years: 20.00    Types: Cigarettes    Quit date: 07/15/2011    Years since quitting: 9.6  . Smokeless tobacco: Former Systems developer    Types: Secondary school teacher  . Vaping Use: Never used  Substance Use Topics  . Alcohol use: Yes    Comment: maybe 2 glases wine every other week  . Drug use: No    Family History   Problem Relation Age of Onset  . CVA Mother   . Liver cancer Father    Allergies  Allergen Reactions  . Penicillins Anaphylaxis  . Alfalfa   . Dymista [Azelastine-Fluticasone] Other (See Comments)    Burning sensation   . Flonase [Fluticasone Propionate] Other (See Comments)    burning  . Hydrocodone Other (See Comments)    SWEATING  . Spironolactone Other (See Comments)    Makes chest swelling - or as pt puts it "makes his breasts swell and sore"  . Warfarin And Related Other (See Comments)    Pt states it makes his INR critical levels    OBJECTIVE: Blood pressure 109/88, pulse 66, temperature 98.7 F (37.1 C), temperature source Oral, resp. rate 18, height 6' (1.829 m), weight 120.7 kg, SpO2 96 %.  Physical Exam Well developed, conversant, no distress Heent:  normocephalic; per; conj clear; eomi; oropharynx clear Neck supple Cv: rrr no mrg Lungs: cta; normal respiratory effort abd s/nt Ext no edema Skin no rash Msk: tender on active rom of left shoulder; mild-moderate tenderness palpation periarticular area. No obvious swelling/erythema/fluctuance though. Bilateral knee without effusion/warmth or tenderness Neuro cn2-12 intact, nonfocal Psych alert/oriented  Lab Results Lab Results  Component Value Date   WBC 12.1 (H) 03/04/2021   HGB 13.8 03/04/2021   HCT 42.1 03/04/2021   MCV 97.5 03/04/2021   PLT 340 03/04/2021    Lab Results  Component Value Date   CREATININE 0.92 03/04/2021   BUN 28 (H) 03/04/2021   NA 137 03/04/2021   K 3.5 03/04/2021   CL 100 03/04/2021   CO2 27 03/04/2021    Lab Results  Component Value Date   ALT 28 03/25/2016   AST 33 03/25/2016   ALKPHOS 100 03/25/2016   BILITOT 1.2 03/25/2016     Microbiology: Recent Results (from the past 240 hour(s))  Resp Panel by RT-PCR (Flu A&B, Covid) Nasopharyngeal Swab     Status: None   Collection Time: 03/03/21  7:00 PM   Specimen: Nasopharyngeal Swab; Nasopharyngeal(NP) swabs in vial  transport medium  Result Value Ref Range Status   SARS Coronavirus 2 by RT PCR NEGATIVE NEGATIVE Final    Comment: (NOTE) SARS-CoV-2 target nucleic acids are NOT DETECTED.  The SARS-CoV-2 RNA is generally detectable in upper respiratory specimens during the acute phase of infection. The lowest concentration of SARS-CoV-2 viral copies this assay can detect is 138 copies/mL. A negative result does not preclude SARS-Cov-2 infection and should not be used as the sole basis for treatment or other patient management decisions. A negative result may occur with  improper specimen collection/handling, submission of specimen other than nasopharyngeal swab, presence of viral mutation(s) within the areas targeted by this assay, and inadequate number of viral copies(<138 copies/mL). A negative result must be combined with clinical observations, patient history, and epidemiological information. The expected result is Negative.  Fact Sheet for Patients:  EntrepreneurPulse.com.au  Fact Sheet for Healthcare Providers:  IncredibleEmployment.be  This test is no t yet approved or cleared by the [place] FDA and  has been authorized for detection and/or diagnosis of SARS-CoV-2 by FDA under an Emergency Use Authorization (EUA). This EUA will remain  in effect (meaning this test can be used) for the duration of the COVID-19 declaration under Section 564(b)(1) of the Act, 21 U.S.C.section 360bbb-3(b)(1), unless the authorization is terminated  or revoked sooner.       Influenza A by PCR NEGATIVE NEGATIVE Final   Influenza B by PCR NEGATIVE NEGATIVE Final    Comment: (NOTE) The Xpert Xpress SARS-CoV-2/FLU/RSV plus assay is intended as an aid in the diagnosis of influenza from Nasopharyngeal swab specimens and should not be used as a sole basis for treatment. Nasal washings and aspirates are unacceptable for Xpert Xpress SARS-CoV-2/FLU/RSV testing.  Fact  Sheet for Patients: EntrepreneurPulse.com.au  Fact Sheet for Healthcare Providers: IncredibleEmployment.be  This test is not yet approved or cleared by the [place] FDA and has been authorized for detection and/or diagnosis of SARS-CoV-2 by FDA under an Emergency Use Authorization (EUA). This EUA will remain in effect (meaning this test can be used) for the duration of the COVID-19 declaration under Section 564(b)(1) of the Act, 21 U.S.C. section 360bbb-3(b)(1), unless the authorization is terminated or revoked.  Performed at [facility] [place] [address] [place], [place] [address]   Surgical  pcr screen     Status: None   Collection Time: 03/04/21  2:16 AM   Specimen: Nasal Mucosa; Nasal Swab  Result Value Ref Range Status   MRSA, PCR NEGATIVE NEGATIVE Final   Staphylococcus aureus NEGATIVE NEGATIVE Final    Comment: (NOTE) The Xpert SA Assay (FDA approved for NASAL specimens in patients 51 years of age and older), is one component of a comprehensive surveillance program. It is not intended to diagnose infection nor to guide or monitor treatment. Performed at [facility], [place] [address], [place],  [address]      Serology:   Imaging: If present, new imagings (plain films, ct scans, and mri) have been personally visualized and interpreted; radiology reports have been reviewed. Decision making incorporated into the Impression / Recommendations.  3/29 left shoulder ct with contrast Gas containing fluid collections in the subscapularis and deltoid highly suspicious for pyomyositis. The patient also has a glenohumeral joint effusion which is likely septic given these findings. Recommend MRI of the shoulder with and without contrast for further evaluation and to assess for osteomyelitis.  Mild sclerosis and fragmentation of the acromion is likely due to chronic abutment with the humeral  head.  Evaluation of the rotator cuff is limited on CT but the supraspinatus and infraspinatus appear completely torn and retracted to the glenoid. There is severe infraspinatus and moderate supraspinatus fatty replacement   3/29 mri left shoulder 1. Large complex joint effusion with thick enhancing synovitis decompresses into the subcoracoid recess. Overall appearance is most concerning for septic arthritis until proven otherwise. Recommend arthrocentesis. 2. High-grade partial-thickness cartilage loss of the glenohumeral joint. Subchondral bone marrow edema in the inferior aspect of the glenoid likely reflecting reactive changes secondary to overlying cartilage loss and osteoarthritis, but early osteomyelitis cannot be excluded. 3. Complete tear of the supraspinatus and infraspinatus tendons with 5.2 cm of retraction. 4. Complete tear of the subscapularis tendon. 5. Medial dislocation of the long head of the biceps tendon. Tendinosis of the intra-articular portion of the long head of the biceps tendon with a partial tear. Severe synovitis in the long head of the biceps tendon sheath which may be reactive versus extension of intra-articular infection  Jabier Mutton, MD [facility] [place] [phone] pager    03/04/2021, 9:29 AM

## 2021-03-04 NOTE — Progress Notes (Signed)
Pharmacy Antibiotic Note  Willie Pineda is a 75 y.o. male admitted on 03/03/2021 with septic arthritis of left shoulder.  PMH significant for AFib, HTN, DM, OA, CHF, COPD.  Reports chronic shoulder pain due to an accident years ago.  CT 3/29 showed abscess/septic joint.  Pharmacy has been consulted for Vancomycin dosing.  Patient received Vancomycin 1gm IV x 1 in the ED. 03/04/2021 Pharmacy consulted for zosyn for septic joint but pt w/ anaphylaxis to PCN & no hx of cephalosporins in Epic> changed to aztreonam & flagyl  Plan: Aztreonam 2 gm IV q8h Flagyl 500 mg IV q8h Continues on  Vancomycin 1000 mg IV Q 12 hrs. Goal AUC 400-550.  Expected AUC: 460.2  SCr used: 0.86 using VD = 0.5  Follow renal function, clinical symptoms  Check vancomycin levels as needed f/u ID plans: see note today "Can start doxycycline 100 mg po bid and levofloxacin 750 mg po daily after surgery"   Height: 6' (182.9 cm) Weight: 120.7 kg (266 lb 1.5 oz) IBW/kg (Calculated) : 77.6  Temp (24hrs), Avg:98.4 F (36.9 C), Min:97.3 F (36.3 C), Max:99.1 F (37.3 C)  Recent Labs  Lab 03/03/21 1900 03/04/21 0421  WBC 12.5* 12.1*  CREATININE 0.86 0.92    Estimated Creatinine Clearance: 93 mL/min (by C-G formula based on SCr of 0.92 mg/dL).    Allergies  Allergen Reactions  . Penicillins Anaphylaxis  . Alfalfa   . Dymista [Azelastine-Fluticasone] Other (See Comments)    Burning sensation   . Flonase [Fluticasone Propionate] Other (See Comments)    burning  . Hydrocodone Other (See Comments)    SWEATING  . Spironolactone Other (See Comments)    Makes chest swelling - or as pt puts it "makes his breasts swell and sore"  . Warfarin And Related Other (See Comments)    Pt states it makes his INR critical levels    Antimicrobials this admission: 3/29 Vanc >>   3/30 aztreonam>> 3/30 flagyl>> Dose adjustments this admission:   Microbiology results:   Thank you for allowing pharmacy to be a part of this  patient's care.  Eudelia Bunch, Pharm.D 03/04/2021 6:10 PM

## 2021-03-04 NOTE — Progress Notes (Signed)
PROGRESS NOTE  KASHIUS DOMINIC  YDX:[id number] DOB: Jul 03, 1946 DOA: 03/03/2021 PCP: Josetta Huddle, MD   Brief Narrative: Willie Pineda is a 75 y.o. male with a history of diet-controlled T2DM, HTN, CHF, atrial fibrillation, COPD, OSA, OA s/p multiple joint replacements who was sent to the ED 3/29 due to abnormal left shoulder CT as outpatient. He was being worked up with CT of the left shoulder in preparation for arthroplasty which showed evidence of abscess/septic joint. He was hemodynamically stable in the ED, started on vancomycin and admitted, subsequently undergoing washout with antibiotic spacer placement 3/30 by Dr. Griffin Basil.   Assessment & Plan: Principal Problem:   Septic arthritis of shoulder, left (HCC) Active Problems:   Atrial fibrillation, chronic (HCC)   Chronic anticoagulation   DM (diabetes mellitus), type 2 with neurological complications (HCC)   Essential hypertension   Hypokalemia   Prolonged QT interval   COPD (chronic obstructive pulmonary disease) (HCC)   Chronic CHF (congestive heart failure) (HCC)   Chronic osteomyelitis (HCC)   Diabetic foot infection ([place])   S/p total knee replacement, bilateral  Septic arthritis, pyomyositis of left shoulder: s/p washout of approximately 6x7cm fluid collection 3/30 with antibiotic spacer placement. With gas formation and osteomyelitis changes on MRI.  - ID consulted for antibiotic recommendations, intraoperative cultures pending. - Continue pain control with tylenol (chronic Tx per pt) and prn oxyIR (pt prefers not to use dilaudid or other IV medications if possible). - Start diet, restart xarelto per orthopedics 3/31.  - NWB LUE with plans for orthopedics follow up in 1 week and delayed PT. Planning eventual reverse arthroplasty.   Left 2nd toe ulceration: Healing without SSTI.   Chronic atrial fibrillation:  - Monitor telemetry - Restart xarelto in AM.   Diet-controlled T2DM: No SSI required currently.   Chronic  HFpEF, HTN: Euvolemic without angina or HF symptoms PTA. Optimized as far as reasonably possible for surgery. - Continue metoprolol, entresto, demadex.   COPD: No exacerbation.  - Continue prns.   Hypokalemia: Supplementing  Prolonged QT:  - Telemetry  Obesity: Estimated body mass index is 36.09 kg/m as calculated from the following:   Height as of this encounter: 6' (1.829 m).   Weight as of this encounter: 120.7 kg.  DVT prophylaxis: Xarelto on hold, SCDs Code Status: Full Family Communication: Wife at bedside Disposition Plan:  Status is: Inpatient  Remains inpatient appropriate because:Ongoing active pain requiring inpatient pain management and Inpatient level of care appropriate due to severity of illness  Dispo: The patient is from: Home              Anticipated d/c is to: Home              Patient currently is not medically stable to d/c.   Difficult to place patient No  Consultants:   Orthopedics  ID  Procedures:   03/04/2021 Dr. Griffin Basil:  - Left shoulder antibiotic spacer hemiarthroplasty  - Left shoulder deep incision and debridement of abscess with osteomyelitis of humeral head - Incision debridement open of septic shoulder with synovectomy - Incision debridement of AC joint and osteomyelitis of distal clavicle  Antimicrobials:  Vancomycin 3/29 >   Doxycycline 3/18 - 3/29  Subjective: Pain is controlled, no palpitations, dyspnea, chest pain, leg swelling.   Objective: Vitals:   03/04/21 1337 03/04/21 1340 03/04/21 1345 03/04/21 1350  BP:  139/87 127/76 131/74  Pulse: (!) 126 78 69 81  Resp: (!) 30 20 (!) 25 (!) 25  Temp:      TempSrc:      SpO2: 98% 97% 98% 98%  Weight:      Height:        Intake/Output Summary (Last 24 hours) at 03/04/2021 1628 Last data filed at 03/04/2021 1547 Gross per 24 hour  Intake 1037.05 ml  Output 325 ml  Net 712.05 ml   Filed Weights   03/03/21 1813 03/04/21 1248  Weight: 120.7 kg 120.7 kg    Gen: 75 y.o.  male in no distress Pulm: Non-labored breathing room air. Clear to auscultation bilaterally.  CV: Irreg irreg. No murmur, rub, or gallop. No definite JVD, minimal pedal edema. GI: Abdomen soft, non-tender, non-distended, with normoactive bowel sounds. No organomegaly or masses felt. Ext: Warm, no deformities. Left shoulder not examined pre-op. Skin: Left 2nd toe distally with healing cullus filling previous ulcer without spreading erythema. Neuro: Alert and oriented. No focal neurological deficits. Psych: Judgement and insight appear normal. Mood & affect appropriate.   Data Reviewed: I have personally reviewed following labs and imaging studies  CBC: Recent Labs  Lab 03/03/21 1900 03/04/21 0421  WBC 12.5* 12.1*  HGB 15.1 13.8  HCT 46.4 42.1  MCV 97.9 97.5  PLT 348 387   Basic Metabolic Panel: Recent Labs  Lab 03/03/21 1900 03/04/21 0421  NA 138 137  K 3.4* 3.5  CL 101 100  CO2 26 27  GLUCOSE 134* 108*  BUN 28* 28*  CREATININE 0.86 0.92  CALCIUM 8.7* 8.7*   GFR: Estimated Creatinine Clearance: 93 mL/min (by C-G formula based on SCr of 0.92 mg/dL). Liver Function Tests: No results for input(s): AST, ALT, ALKPHOS, BILITOT, PROT, ALBUMIN in the last 168 hours. No results for input(s): LIPASE, AMYLASE in the last 168 hours. No results for input(s): AMMONIA in the last 168 hours. Coagulation Profile: No results for input(s): INR, PROTIME in the last 168 hours. Cardiac Enzymes: No results for input(s): CKTOTAL, CKMB, CKMBINDEX, TROPONINI in the last 168 hours. BNP (last 3 results) No results for input(s): PROBNP in the last 8760 hours. HbA1C: No results for input(s): HGBA1C in the last 72 hours. CBG: Recent Labs  Lab 03/04/21 1249  GLUCAP 112*   Lipid Profile: No results for input(s): CHOL, HDL, LDLCALC, TRIG, CHOLHDL, LDLDIRECT in the last 72 hours. Thyroid Function Tests: No results for input(s): TSH, T4TOTAL, FREET4, T3FREE, THYROIDAB in the last 72  hours. Anemia Panel: No results for input(s): VITAMINB12, FOLATE, FERRITIN, TIBC, IRON, RETICCTPCT in the last 72 hours. Urine analysis:    Component Value Date/Time   COLORURINE YELLOW 03/25/2016 1022   APPEARANCEUR CLEAR 03/25/2016 1022   LABSPEC 1.011 03/25/2016 1022   PHURINE 6.5 03/25/2016 1022   GLUCOSEU NEGATIVE 03/25/2016 1022   HGBUR NEGATIVE 03/25/2016 1022   BILIRUBINUR NEGATIVE 03/25/2016 1022   KETONESUR NEGATIVE 03/25/2016 1022   PROTEINUR NEGATIVE 03/25/2016 1022   NITRITE NEGATIVE 03/25/2016 1022   LEUKOCYTESUR NEGATIVE 03/25/2016 1022   Recent Results (from the past 240 hour(s))  Resp Panel by RT-PCR (Flu A&B, Covid) Nasopharyngeal Swab     Status: None   Collection Time: 03/03/21  7:00 PM   Specimen: Nasopharyngeal Swab; Nasopharyngeal(NP) swabs in vial transport medium  Result Value Ref Range Status   SARS Coronavirus 2 by RT PCR NEGATIVE NEGATIVE Final    Comment: (NOTE) SARS-CoV-2 target nucleic acids are NOT DETECTED.  The SARS-CoV-2 RNA is generally detectable in upper respiratory specimens during the acute phase of infection. The lowest concentration of SARS-CoV-2 viral copies  this assay can detect is 138 copies/mL. A negative result does not preclude SARS-Cov-2 infection and should not be used as the sole basis for treatment or other patient management decisions. A negative result may occur with  improper specimen collection/handling, submission of specimen other than nasopharyngeal swab, presence of viral mutation(s) within the areas targeted by this assay, and inadequate number of viral copies(<138 copies/mL). A negative result must be combined with clinical observations, patient history, and epidemiological information. The expected result is Negative.  Fact Sheet for Patients:  EntrepreneurPulse.com.au  Fact Sheet for Healthcare Providers:  IncredibleEmployment.be  This test is no t yet approved or cleared  by the [place] FDA and  has been authorized for detection and/or diagnosis of SARS-CoV-2 by FDA under an Emergency Use Authorization (EUA). This EUA will remain  in effect (meaning this test can be used) for the duration of the COVID-19 declaration under Section 564(b)(1) of the Act, 21 U.S.C.section 360bbb-3(b)(1), unless the authorization is terminated  or revoked sooner.       Influenza A by PCR NEGATIVE NEGATIVE Final   Influenza B by PCR NEGATIVE NEGATIVE Final    Comment: (NOTE) The Xpert Xpress SARS-CoV-2/FLU/RSV plus assay is intended as an aid in the diagnosis of influenza from Nasopharyngeal swab specimens and should not be used as a sole basis for treatment. Nasal washings and aspirates are unacceptable for Xpert Xpress SARS-CoV-2/FLU/RSV testing.  Fact Sheet for Patients: EntrepreneurPulse.com.au  Fact Sheet for Healthcare Providers: IncredibleEmployment.be  This test is not yet approved or cleared by the [place] FDA and has been authorized for detection and/or diagnosis of SARS-CoV-2 by FDA under an Emergency Use Authorization (EUA). This EUA will remain in effect (meaning this test can be used) for the duration of the COVID-19 declaration under Section 564(b)(1) of the Act, 21 U.S.C. section 360bbb-3(b)(1), unless the authorization is terminated or revoked.  Performed at [facility], [place] [address], [place], [place] [address]   Surgical pcr screen     Status: None   Collection Time: 03/04/21  2:16 AM   Specimen: Nasal Mucosa; Nasal Swab  Result Value Ref Range Status   MRSA, PCR NEGATIVE NEGATIVE Final   Staphylococcus aureus NEGATIVE NEGATIVE Final    Comment: (NOTE) The Xpert SA Assay (FDA approved for NASAL specimens in patients 5 years of age and older), is one component of a comprehensive surveillance program. It is not intended to diagnose infection nor to guide or monitor  treatment. Performed at [facility], [place] [address], [place], [place] [address]       Radiology Studies: CT SHOULDER LEFT WO CONTRAST  Result Date: 03/03/2021 CLINICAL DATA:  Left shoulder pain and limited range of motion for 3 months since a blow to the shoulder. EXAM: CT OF THE UPPER LEFT EXTREMITY WITHOUT CONTRAST TECHNIQUE: Multidetector CT imaging of the upper left extremity was performed according to the standard protocol. COMPARISON:  None. FINDINGS: Bones/Joint/Cartilage No fracture or dislocation is identified. The patient has moderate acromioclavicular and mild appearing glenohumeral osteoarthritis. Pre acromion type os acromiale is identified. Cystic change and sclerosis in the acromion is identified and likely due to chronic abutment with the humeral head. Glenohumeral joint effusion is identified. Ligaments Suboptimally assessed by CT. Muscles and Tendons Although discrete measurement is difficult, there is a gas containing fluid collection in the subscapularis measuring approximately 6.7 cm transverse by 3 cm AP by 4.2 cm craniocaudal. A second gas containing fluid collection in the anterior aspect  of the deltoid measures approximately 6.6 cm craniocaudal by 3 cm transverse by 1.5 cm AP. These collections may communicate. Although evaluation of the rotator cuff tendons is limited on CT, the supraspinatus and infraspinatus tendons appear completely torn with retraction to nearly the glenohumeral joint. There is severe infraspinatus and mild supraspinatus atrophy. Soft tissues As described above. IMPRESSION: Gas containing fluid collections in the subscapularis and deltoid highly suspicious for pyomyositis. The patient also has a glenohumeral joint effusion which is likely septic given these findings. Recommend MRI of the shoulder with and without contrast for further evaluation and to assess for osteomyelitis. Mild sclerosis and fragmentation of the acromion is likely due to  chronic abutment with the humeral head. Evaluation of the rotator cuff is limited on CT but the supraspinatus and infraspinatus appear completely torn and retracted to the glenoid. There is severe infraspinatus and moderate supraspinatus fatty replacement. These results will be called to the ordering clinician or representative by the Radiologist Assistant, and communication documented in the PACS or [employer]. Electronically Signed   By: Inge Rise M.D.   On: 03/03/2021 15:00   MR Shoulder Left W Wo Contrast  Result Date: 03/04/2021 CLINICAL DATA:  Prior injury years ago. Shoulder pain. Evaluate for septic arthritis EXAM: MRI OF THE LEFT SHOULDER WITHOUT AND WITH CONTRAST TECHNIQUE: Multiplanar, multisequence MR imaging of the LEFT shoulder was performed before and after the administration of intravenous contrast. CONTRAST:  4mL GADAVIST GADOBUTROL 1 MMOL/ML IV SOLN COMPARISON:  None. FINDINGS: Rotator cuff: Complete tear of the supraspinatus and infraspinatus tendons with 5.2 cm of retraction. Teres minor tendon is intact. Complete tear of the subscapularis tendon. Muscles: Severe atrophy of the supraspinatus and infraspinatus muscles. Mild atrophy of the subscapularis muscle. Biceps Long Head: Medial dislocation of the long head of biceps tendon. Tendinosis of the intra-articular portion of the long head of the biceps tendon with a partial tear. Severe synovitis in the long head of the biceps tendon sheath. Acromioclavicular Joint: Severe arthropathy of the acromioclavicular joint. Type I acromion. Small amount of subacromial/subdeltoid bursal fluid. Glenohumeral Joint: Large complex joint effusion with thick enhancing synovitis. Large amount of joint fluid decompresses into the subcoracoid recess. high-grade partial-thickness cartilage loss of the glenohumeral joint. Labrum: Diffuse labral degeneration. Bones: No fracture or dislocation. Subchondral bone marrow edema in the inferior aspect of  the glenoid likely reflecting reactive changes secondary to overlying cartilage loss and osteoarthritis, but early osteomyelitis. IMPRESSION: 1. Large complex joint effusion with thick enhancing synovitis decompresses into the subcoracoid recess. Overall appearance is most concerning for septic arthritis until proven otherwise. Recommend arthrocentesis. 2. High-grade partial-thickness cartilage loss of the glenohumeral joint. Subchondral bone marrow edema in the inferior aspect of the glenoid likely reflecting reactive changes secondary to overlying cartilage loss and osteoarthritis, but early osteomyelitis cannot be excluded. 3. Complete tear of the supraspinatus and infraspinatus tendons with 5.2 cm of retraction. 4. Complete tear of the subscapularis tendon. 5. Medial dislocation of the long head of the biceps tendon. Tendinosis of the intra-articular portion of the long head of the biceps tendon with a partial tear. Severe synovitis in the long head of the biceps tendon sheath which may be reactive versus extension of intra-articular infection. Electronically Signed   By: Kathreen Devoid   On: 03/04/2021 08:12    Scheduled Meds: . [MAR Hold] atorvastatin  10 mg Oral Daily  . fentaNYL (SUBLIMAZE) injection  50-100 mcg Intravenous UD  . [MAR Hold] gabapentin  900 mg Oral  BID  . [MAR Hold] metoprolol succinate  50 mg Oral Daily  . midazolam  1-2 mg Intravenous UD  . [MAR Hold] potassium chloride  10 mEq Oral Daily  . [MAR Hold] sacubitril-valsartan  1 tablet Oral BID  . [MAR Hold] sertraline  100 mg Oral Daily  . [MAR Hold] torsemide  100 mg Oral Daily   Continuous Infusions: . lactated ringers 75 mL/hr at 03/04/21 0025  . lactated ringers 10 mL/hr at 03/04/21 1255  . [MAR Hold] vancomycin 1,000 mg (03/04/21 1033)     LOS: 1 day   Time spent: 25 minutes.  Patrecia Pour, MD [facility] [email or web address] 03/04/2021, 4:28 PM

## 2021-03-04 NOTE — Progress Notes (Signed)
Spoke to [employer] and received surgery report for later today.

## 2021-03-04 NOTE — Progress Notes (Signed)
Pt returned from surgery, denies pain. No change in assessment. Left shoulder is in sling with wound vac applied in surgery. Pt had $185 dollars in cash. Pt gave me a $20 bill. Purchased pt a sub from [employer]. Returned with sub and $10 change. Remaining $165 placed back in his pants pocket. Pt is aware.  Willie Pineda

## 2021-03-04 NOTE — Op Note (Signed)
Orthopaedic Surgery Operative Note (CSN: [id number])  Sohrab W Colantonio  1946/06/24 Date of Surgery: 03/03/2021 - 03/04/2021   Diagnoses:  Left septic shoulder with anterior shoulder abscess and septic AC joint  Procedure: Left shoulder antibiotic spacer hemiarthroplasty  Left shoulder deep incision and debridement of abscess with osteomyelitis of humeral head Incision debridement open of septic shoulder with synovectomy Incision debridement of AC joint and osteomyelitis of distal clavicle    Operative Finding Successful completion of planned procedure.  Patient had a large 6 x 7 approximately centimeter abscess in the subcoracoid region involving what likely used to be the subscapularis.  This appeared relatively congealed but there was clear purulent material.  This has been present it appeared for quite some time.  There was no subscapularis, supraspinatus or infraspinatus remnants available as they have been chronically torn.  There is infection tracking down the bicipital groove.  There is no obvious osteomyelitis on appearance of the humeral head however based on MRI findings we felt that it was likely present.  There is clear osteomyelitis and bony changes with chronic infectious changes at the [facility] joint.  Antibiotic spacer hemiarthroplasty was placed without issue.  Patient had only been off of his anticoagulant for about 30 hours however due to the gas-forming nature of the bacteria we felt it was important to perform a washer urgently.  He had more than the average amount of blood loss however we estimated this is about 300 cc.   Post-operative plan: The patient will be NWB in sling.  The patient will be will be admitted to observation due to medical complexity, monitoring and pain management.  DVT prophylaxis per primary team, no orthopedic contraindications can restart baseline Xarelto tomorrow.  Pain control with PRN pain medication preferring oral medicines.  Follow up plan will be  scheduled in approximately 7 days for incision check and XR.  We will likely not start any physical therapy until after 4 weeks.  Patient will need suppressive antibiotics after his treatment dose for the eventual plan to perform a reverse shoulder arthroplasty.  Implants: Tornier size 4 stem with 53 head made of antibiotic cement  Post-Op Diagnosis: Same Surgeons:Primary: [name], MD Assistants:Caroline McBane PA-C Location: [place] [facility] Anesthesia: General with Exparel Interscalene Antibiotics: On continuous vancomycin per infectious disease Tourniquet time: None Estimated Blood Loss: 409 Complications: None Specimens: For for culture, aerobic, anaerobic, AFB and mycobacterial.  Hold for 3 weeks to rule out C acnes Implants: Implant Name Type Inv. Item Serial No. Manufacturer Lot No. LRB No. Used Action  CEMENT BONE GENTAMICIN - [id number] Cement CEMENT BONE GENTAMICIN  Dewitt Hoes [id number] Left 2 Implanted    Indications for Surgery:   Renel DONAVYN FECHER is a 75 y.o. male with chronic history of left shoulder pain with cuff tear arthropathy on x-ray referred to me by my partner.  He had no overt signs of infection in his shoulder but he had a diabetic foot ulcer being treated by a podiatrist.  He been on doxycycline till recently.  We obtained a CT for typical preoperative planning the setting of cuff tear arthropathy.  On the CT we identified that there was gas-forming collections in the subcoracoid space in the subdeltoid space.  There is also signs of changes at the [facility] joint.  This is concerning for septic arthritis as well as septic AC arthritis.  Patient was admitted urgently to the hospitalist service and medicine team manage his antibiotics with infectious disease consulting today.  He  had clear signs of infection on MRI and we advised that it was appropriate to go forward with a incision and debridement with antibiotic spacer placement in the setting of AC septic arthritis and  likely distal clavicle osteomyelitis, humeral head osteomyelitis, subcoracoid abscess.  Benefits and risks of operative and nonoperative management were discussed prior to surgery with patient/guardian(s) and informed consent form was completed.  Infection and need for further surgery were discussed as was prosthetic stability and cuff issues.  We additionally specifically discussed risks of axillary nerve injury, infection, periprosthetic fracture, continued pain and longevity of implants prior to beginning procedure.      Procedure:   The patient was identified in the preoperative holding area where the surgical site was marked. Block placed by anesthesia with exparel.  The patient was taken to the OR where a procedural timeout was called and the above noted anesthesia was induced.  The patient was positioned beachchair on allen table with spider arm positioner.  Preoperative antibiotics were dosed.  The patient's left shoulder was prepped and draped in the usual sterile fashion.  A second preoperative timeout was called.       We began with deltopectoral approach.  With the skin sharply achieving hemostasis we progressed.  We identified the deltopectoral interval by identifying the cephalic vein.  This was mobilized and retracted laterally.  There was adherent tissue in the deltopectoral interval and upon opening the deltopectoral interval we identified and encountered purulent material that was primarily deep to the clavipectoral fascia.  We expressed significant amounts of gross pus from this area.  We had a pseudocapsule which was excised in its entirety.  This tracked down to the joint and there was clearly septic arthritis involving the glenohumeral joint as well.  We peeled the capsule off the anterior portion of the humeral head however there was essentially no subscapularis left there is only stump of tendon still left on the humeral head.  Subscapularis, supraspinatus and instruments were all  torn.  There was a remnant of teres minor that appeared to still be relatively intact and this was left as it was.  At this point we performed a debridement as described below.  Debridement type: Excisional Debridement  Side: left  Body Location: Shoulder, glenohumeral joint, subcoracoid abscess, humeral head osteomyelitis and distal clavicle and AC septic arthritis with distal clavicle osteomyelitis  Tools used for debridement: scalpel, scissors, curette and rongeur, saw  Pre-debridement Wound size (cm):   Length: 7        width: 6     depth: 3  Post-debridement Wound size (cm):   Length: 7        width: 6     depth: 3  Debridement depth beyond dead/damaged tissue down to healthy viable tissue: yes  Tissue layer involved: skin, subcutaneous tissue, muscle / fascia, bone  Nature of tissue removed: Necrotic, Non-viable tissue and Purulence  Irrigation volume: 6 L     Irrigation fluid type: Normal Saline and irrisept   We performed a humeral head resection as is typical using a cutting guide to the [place] system.  We sized up with broaches until we achieved appropriate fit with a size 4 broach.  We selected a 53 size head which was used as a template for a antibiotic spacer which was made on the back table using cement impregnated with gentamicin and vancomycin.  While we continued with our debridement the cement spacer was made on the back table allowed to harden.  There is eventually checked and appropriately sized and contoured.  At this point even made our humeral head resection we used retractors to expose the glenoid.  There was purulent material around the stump of the biceps and this was resected as was the anterior labrum.  There is no sign of deep abscess posterior to the glenoid.  We irrigated with 3 L normal saline both the glenoid humeral joint and the subcoracoid recess.  Any synovium that we identified we resected.  We then turned our attention to the distal clavicle  excision.  Our deltopectoral incision was extended proximally about 3 more centimeters and were able to identify the [facility] joint.  It was opened from the superior aspect out of plane with our incision.  We opened the fascia and identified purulent material at the [facility] joint.  There was clear softening of the distal clavicle consistent with osteomyelitis.  We performed a 10 mm resection of the distal clavicle and careful synovectomy and debridement of the AC joint.  Once the distal clavicle resection was performed remove the segment and sent it for culture.  Turned attention back to the glenohumeral joint.  R antibiotic spacer was made and we placed our blunt retractors and care to protect the axillary nerve again and placed our single piece hemiarthroplasty component.  We tapped it down achieving reasonable purchase with this.  Then were able to reduce the joint noted appropriately stable joint.  We at this point again irrigated with another 3 L of normal saline in the deep layers confirming there is no further necrotic or unhealthy tissue before closing with loose closure of the remnant of the subscapularis with a #1 PDS.  The deltopectoral interval was loosely closed with #1 PDS with 3 interrupted sutures before closing the skin loosely with 2-0 PDS and eventually nylon sutures.  A Hemovac was placed with 10 holes left in the shoulder.  We placed a Prevena type dressing and wound VAC.  Sling was placed the patient awoken taken back in stable condition.     Noemi Chapel, PA-C, present and scrubbed throughout the case, critical for completion in a timely fashion, and for retraction, instrumentation, closure.

## 2021-03-04 NOTE — Consult Note (Signed)
ORTHOPAEDIC CONSULTATION  REQUESTING PHYSICIAN: Patrecia Pour, MD  Chief Complaint:  Left shoulder pain with abnormal CT scan of shoulder  HPI: Willie Pineda is a 75 y.o. male with  medical history significant for atrial fibrillation, HTN, DMT2-diet controlled, OA, CHF, COPD, OSA. He has had chronic left shoulder pain for many years. Pain worsened in December after a fall where he landed on his left shoulder. He initially thought the pain was getting better with time. He had injections that helped some, but over the past 2-3 weeks his pain was very severe. He has limited range of motion. He saw Dr. Griffin Basil on 02/27/2021 in the office. CT scan showed signs of infection. He was admitted to the hospital on 03/03/2021 for IV antibiotics and plan for surgery.   He has a diabetic foot ulcer on the left foot. He sees a Art therapist in [place]. He was initially treated with Doxycycline which he finished on Monday. He was planning to see his podiatrist outpatient sometime this week.   He is very limited by the function of his left shoulder. He has very limited range of motion and his pain interferes with his daily life. 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   Past Medical History:  Diagnosis Date  . Arthritis    "hands primarily" (05/05/2016)  . Atrial fibrillation ([place])   . Back pain    age related  . CHF (congestive heart failure) ([place])   . Complication of anesthesia    Bronchial spasms  . COPD (chronic obstructive pulmonary disease) ([facility])    inhalers as needed  . Depression    takes Zoloft daily  . Diabetic peripheral neuropathy ([place]) 01/03/2019  . Dyspnea    with exertion  . Dysrhythmia    A Fib-takes Xarelto daily  . Enlarged prostate    benign  . GERD (gastroesophageal reflux disease)    takes Omeprazole daily  . 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 Hyperlipidemia    takes Atorvastatin daily  . Hypertension    takes  Metoprolol,HCTZ,and Losartan daily.Takes Clonidine if needed  . Nocturia   . Peripheral neuropathy    takes Gabapentin daily  . Pneumonia ?2015  . Restless leg   . Sleep apnea    uses Bipap  . Stroke [facility]) 2012   denies residual on 05/05/2016  . Type II diabetes mellitus ([place])    borderline but doesn't take any meds (05/05/2016)   Past Surgical History:  Procedure Laterality Date  . CATARACT EXTRACTION W/ INTRAOCULAR LENS  IMPLANT, BILATERAL Bilateral   . COLONOSCOPY WITH PROPOFOL N/A 03/09/2017   Procedure: COLONOSCOPY WITH PROPOFOL;  Surgeon: Wonda Horner, MD;  Location: [facility] ENDOSCOPY;  Service: Endoscopy;  Laterality: N/A;  . COLONOSCOPY WITH PROPOFOL N/A 04/03/2020   Procedure: COLONOSCOPY WITH PROPOFOL;  Surgeon: Wonda Horner, MD;  Location: [facility] ENDOSCOPY;  Service: Endoscopy;  Laterality: N/A;  . I & D KNEE WITH POLY EXCHANGE Right 05/04/2016   IRRIGATION AND DEBRIDEMENT Right KNEE WITH UNICOMPARTMENTAL POLY EXCHANGE  . I & D KNEE WITH POLY EXCHANGE Right 05/04/2016   Procedure: IRRIGATION AND DEBRIDEMENT Right KNEE WITH UNICOMPARTMENTAL POLY EXCHANGE;  Surgeon: Renette Butters, MD;  Location: [place];  Service: Orthopedics;  Laterality: Right;  . JOINT REPLACEMENT    . LAPAROSCOPIC CHOLECYSTECTOMY  1995  . PARTIAL KNEE ARTHROPLASTY Right 04/06/2016   Procedure: UNICOMPARTMENTAL RIGHT KNEE;  Surgeon:  Renette Butters, MD;  Location: [place];  Service: Orthopedics;  Laterality: Right;  . POLYPECTOMY  04/03/2020   Procedure: POLYPECTOMY;  Surgeon: Wonda Horner, MD;  Location: [facility] ENDOSCOPY;  Service: Endoscopy;;  . STRABISMUS SURGERY Bilateral 06/10/2017   Procedure: REPAIR STRABISMUS BILATERAL;  Surgeon: Everitt Amber, MD;  Location: [place];  Service: Ophthalmology;  Laterality: Bilateral;  . TONSILLECTOMY AND ADENOIDECTOMY    . TOTAL KNEE ARTHROPLASTY Left 2008  . ULNAR NERVE TRANSPOSITION Right 1989   Social History   Socioeconomic History  . Marital status:  Married    Spouse name: Not on file  . Number of children: Not on file  . Years of education: Not on file  . Highest education level: Not on file  Occupational History  . Occupation: Retired Therapist, sports  Tobacco Use  . Smoking status: Former Smoker    Packs/day: 0.50    Years: 40.00    Pack years: 20.00    Types: Cigarettes    Quit date: 07/15/2011    Years since quitting: 9.6  . Smokeless tobacco: Former Systems developer    Types: Secondary school teacher  . Vaping Use: Never used  Substance and Sexual Activity  . Alcohol use: Yes    Comment: maybe 2 glases wine every other week  . Drug use: No  . Sexual activity: Yes  Other Topics Concern  . Not on file  Social History Narrative   Lives at home with spouse    Caffeine 2 cups of coffee daily    Right handed.    Social Determinants of Health   Financial Resource Strain: Not on file  Food Insecurity: Not on file  Transportation Needs: Not on file  Physical Activity: Not on file  Stress: Not on file  Social Connections: Not on file   Family History  Problem Relation Age of Onset  . CVA Mother   . Liver cancer Father    Allergies  Allergen Reactions  . Penicillins Anaphylaxis  . Alfalfa   . Dymista [Azelastine-Fluticasone] Other (See Comments)    Burning sensation   . Flonase [Fluticasone Propionate] Other (See Comments)    burning  . Hydrocodone Other (See Comments)    SWEATING  . Spironolactone Other (See Comments)    Makes chest swelling - or as pt puts it "makes his breasts swell and sore"  . Warfarin And Related Other (See Comments)    Pt states it makes his INR critical levels   Prior to Admission medications   Medication Sig Start Date End Date Taking? Authorizing Provider  atorvastatin (LIPITOR) 10 MG tablet Take 10 mg by mouth daily. 02/06/16  Yes [provider]  cloNIDine (CATAPRES) 0.1 MG tablet Take 0.1 mg by mouth daily as needed (Only takes if pt if bp  > 160).   Yes [provider]  cyclobenzaprine  (FLEXERIL) 10 MG tablet Take 10 mg by mouth at bedtime as needed for muscle spasms.  01/06/20  Yes [provider]  doxycycline (VIBRA-TABS) 100 MG tablet Take 100 mg by mouth 2 (two) times daily. Start date : 02/20/21 02/19/21  Yes [provider]  ENTRESTO 24-26 MG Take 1 tablet by mouth 2 (two) times daily. 03/13/20  Yes [provider]  gabapentin (NEURONTIN) 300 MG capsule Take 900 mg by mouth 2 (two) times daily.    Yes [provider]  metolazone (ZAROXOLYN) 2.5 MG tablet Take 2.5 mg by mouth daily.   Yes [provider]  metoprolol succinate (TOPROL-XL) 50 MG 24 hr tablet Take 50 mg by mouth daily. Take with or immediately following a meal.   Yes [provider]  omeprazole (PRILOSEC) 20 MG capsule Take 20 mg by mouth daily.   Yes [provider]  oxyCODONE-acetaminophen (ROXICET) 5-325 MG tablet Take 2 tablets by mouth every 4 (four) hours as needed. Patient taking differently: Take 2 tablets by mouth every 4 (four) hours as needed for severe pain. 04/07/16  Yes Renette Butters, MD  potassium chloride (MICRO-K) 10 MEQ CR capsule Take 10 mEq by mouth daily.   Yes [provider]  rivaroxaban (XARELTO) 20 MG TABS tablet Take 20 mg by mouth daily.   Yes [provider]  sertraline (ZOLOFT) 100 MG tablet Take 100 mg by mouth daily.   Yes [provider]  testosterone cypionate (DEPOTESTOSTERONE CYPIONATE) 200 MG/ML injection Inject 100 mg into the muscle every 14 (fourteen) days.  02/13/20  Yes [provider]  torsemide (DEMADEX) 100 MG tablet Take 100 mg by mouth daily. 09/29/18  Yes [provider]   CT SHOULDER LEFT WO CONTRAST  Result Date: 03/03/2021 CLINICAL DATA:  Left shoulder pain and limited range of motion for 3 months since a blow to the shoulder. EXAM: CT OF THE UPPER LEFT EXTREMITY WITHOUT CONTRAST TECHNIQUE: Multidetector CT imaging of the upper left extremity was performed  according to the standard protocol. COMPARISON:  None. FINDINGS: Bones/Joint/Cartilage No fracture or dislocation is identified. The patient has moderate acromioclavicular and mild appearing glenohumeral osteoarthritis. Pre acromion type os acromiale is identified. Cystic change and sclerosis in the acromion is identified and likely due to chronic abutment with the humeral head. Glenohumeral joint effusion is identified. Ligaments Suboptimally assessed by CT. Muscles and Tendons Although discrete measurement is difficult, there is a gas containing fluid collection in the subscapularis measuring approximately 6.7 cm transverse by 3 cm AP by 4.2 cm craniocaudal. A second gas containing fluid collection in the anterior aspect of the deltoid measures approximately 6.6 cm craniocaudal by 3 cm transverse by 1.5 cm AP. These collections may communicate. Although evaluation of the rotator cuff tendons is limited on CT, the supraspinatus and infraspinatus tendons appear completely torn with retraction to nearly the glenohumeral joint. There is severe infraspinatus and mild supraspinatus atrophy. Soft tissues As described above. IMPRESSION: Gas containing fluid collections in the subscapularis and deltoid highly suspicious for pyomyositis. The patient also has a glenohumeral joint effusion which is likely septic given these findings. Recommend MRI of the shoulder with and without contrast for further evaluation and to assess for osteomyelitis. Mild sclerosis and fragmentation of the acromion is likely due to chronic abutment with the humeral head. Evaluation of the rotator cuff is limited on CT but the supraspinatus and infraspinatus appear completely torn and retracted to the glenoid. There is severe infraspinatus and moderate supraspinatus fatty replacement. These results will be called to the ordering clinician or representative by the Radiologist Assistant, and communication documented in the PACS or [employer].  Electronically Signed   By: Inge Rise M.D.   On: 03/03/2021 15:00   Family History Reviewed and non-contributory, no pertinent history of problems with bleeding or anesthesia      Review of Systems 14 system ROS conducted and negative except for that noted in HPI   OBJECTIVE  Vitals: Patient Vitals for the past 8 hrs:  BP Temp Temp src Pulse Resp SpO2  03/04/21 0418 109/88 98.7 F (37.1 C) Oral 66 18  96 %  03/03/21 2330 (!) 124/114 98.7 F (37.1 C) Oral 98 18 97 %   General: Alert, no acute distress Cardiovascular: Warm extremities noted Respiratory: No cyanosis, no use of accessory musculature GI: No organomegaly, abdomen is soft and non-tender Skin: No lesions in the area of chief complaint other than those listed below in MSK exam.  Neurologic: *Sensation intact distally save for the below mentioned MSK exam Psychiatric: Patient is competent for consent with normal mood and affect Lymphatic: No swelling obvious and reported other than the area involved in the exam below  Extremities  RUE: Actively moves RUE without pain. Non-tender to palpation throughout. NVI LUE: Active forward elevation to about 30 degrees, passive to abut 60 degrees. Crepitus with range of motion. Axillary nerve is firing. Distal motor and sensory function intact.  RLE: Actively moves RLE without pain. Non-tender to palpation throughout. Endorses distal sensation. Warm well perfused foot.  LLE: Actively moves LLE without pain. Non-tender to palpation throughout. Endorses distal sensation. He has a diabetic foot ulcer on the 2nd toe of his left foot with mild surrounding erythema. He denies any tenderness to this. He endorses sensation just proximal to the ulcer. No signs of active drainage. Wiggles toes. Warm well perfused foot.     Test Results Imaging MRI of left shoulder showing complex joint effusion concerning for septic arthritis, with high grade glenohumeral osteoarthritis. Complete tear of  supraspinatus, infraspinatus, and subscapularis.   Labs cbc Recent Labs    03/03/21 1900 03/04/21 0421  WBC 12.5* 12.1*  HGB 15.1 13.8  HCT 46.4 42.1  PLT 348 340    Labs inflam No results for input(s): CRP in the last 72 hours.  Invalid input(s): ESR  Labs coag No results for input(s): INR, PTT in the last 72 hours.  Invalid input(s): PT  Recent Labs    03/03/21 1900 03/04/21 0421  NA 138 137  K 3.4* 3.5  CL 101 100  CO2 26 27  GLUCOSE 134* 108*  BUN 28* 28*  CREATININE 0.86 0.92  CALCIUM 8.7* 8.7*     ASSESSMENT AND PLAN: 75 y.o. male with the following: Left septic shoulder   This patient has been admitted to the medical service for IV antibiotics and medical management. Discussed plan of treatment with the patient. Recommend left shoulder washout with placement of antibiotic spacer today.   The risks benefits and alternatives were discussed with the patient including but not limited to the risks of nonoperative treatment, versus surgical intervention including infection, bleeding, nerve injury,  blood clots, cardiopulmonary complications, morbidity, mortality, among others, and they were willing to proceed.   We additionally specifically discussed risks of axillary nerve injury, infection, periprosthetic fracture, continued pain, limited function, need for revision surgery and longevity of implants prior to beginning procedure.Patient states his understanding of this and is willing to proceed.   - Plan: OR today with Dr. Griffin Basil - Keep NPO - Weight Bearing Status/Activity: NWB - Additional recommendations: ID consultation - VTE Prophylaxis: SCDs for now. Can resume Xarelto if hemoglobin stable tomorrow after surgery  - Pain control: PRN pain medications  - Follow-up plan: TBD - Procedures: Left shoulder washout with placement of antibiotic spacer   Noemi Chapel, PA-C 03/04/2021

## 2021-03-04 NOTE — Anesthesia Procedure Notes (Addendum)
Anesthesia Regional Block: Interscalene brachial plexus block   Pre-Anesthetic Checklist: ,, timeout performed, Correct Patient, Correct Site, Correct Laterality, Correct Procedure, Correct Position, site marked, Risks and benefits discussed,  Surgical consent,  Pre-op evaluation,  At surgeon's request and post-op pain management  Laterality: Upper and Left  Prep: Maximum Sterile Barrier Precautions used, chloraprep       Needles:  Injection technique: Single-shot  Needle Type: Echogenic Needle     Needle Length: 5cm  Needle Gauge: 21     Additional Needles:   Procedures:,,,, ultrasound used (permanent image in chart),,,,  Narrative:  Start time: 03/04/2021 1:23 PM End time: 03/04/2021 1:30 PM Injection made incrementally with aspirations every 5 mL.  Performed by: Personally  Anesthesiologist: Barnet Glasgow, MD  Additional Notes: Block assessed prior to procedure. Patient tolerated procedure well.

## 2021-03-04 NOTE — Progress Notes (Signed)
Assisted Dr. Valma Cava with left, ultrasound guided, interscalene  block. Side rails up, monitors on throughout procedure. See vital signs in flow sheet. Tolerated Procedure well.

## 2021-03-04 NOTE — H&P (View-Only) (Signed)
ORTHOPAEDIC CONSULTATION  REQUESTING PHYSICIAN: Patrecia Pour, MD  Chief Complaint:  Left shoulder pain with abnormal CT scan of shoulder  HPI: Willie Pineda is a 75 y.o. male with  medical history significant for atrial fibrillation, HTN, DMT2-diet controlled, OA, CHF, COPD, OSA. He has had chronic left shoulder pain for many years. Pain worsened in December after a fall where he landed on his left shoulder. He initially thought the pain was getting better with time. He had injections that helped some, but over the past 2-3 weeks his pain was very severe. He has limited range of motion. He saw Dr. Griffin Basil on 02/27/2021 in the office. CT scan showed signs of infection. He was admitted to the hospital on 03/03/2021 for IV antibiotics and plan for surgery.   He has a diabetic foot ulcer on the left foot. He sees a Art therapist in [place]. He was initially treated with Doxycycline which he finished on Monday. He was planning to see his podiatrist outpatient sometime this week.   He is very limited by the function of his left shoulder. He has very limited range of motion and his pain interferes with his daily life. 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   Past Medical History:  Diagnosis Date  . Arthritis    "hands primarily" (05/05/2016)  . Atrial fibrillation ([place])   . Back pain    age related  . CHF (congestive heart failure) ([place])   . Complication of anesthesia    Bronchial spasms  . COPD (chronic obstructive pulmonary disease) ([facility])    inhalers as needed  . Depression    takes Zoloft daily  . Diabetic peripheral neuropathy ([place]) 01/03/2019  . Dyspnea    with exertion  . Dysrhythmia    A Fib-takes Xarelto daily  . Enlarged prostate    benign  . GERD (gastroesophageal reflux disease)    takes Omeprazole daily  . 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 Hyperlipidemia    takes Atorvastatin daily  . Hypertension    takes  Metoprolol,HCTZ,and Losartan daily.Takes Clonidine if needed  . Nocturia   . Peripheral neuropathy    takes Gabapentin daily  . Pneumonia ?2015  . Restless leg   . Sleep apnea    uses Bipap  . Stroke [facility]) 2012   denies residual on 05/05/2016  . Type II diabetes mellitus ([place])    borderline but doesn't take any meds (05/05/2016)   Past Surgical History:  Procedure Laterality Date  . CATARACT EXTRACTION W/ INTRAOCULAR LENS  IMPLANT, BILATERAL Bilateral   . COLONOSCOPY WITH PROPOFOL N/A 03/09/2017   Procedure: COLONOSCOPY WITH PROPOFOL;  Surgeon: Wonda Horner, MD;  Location: [facility] ENDOSCOPY;  Service: Endoscopy;  Laterality: N/A;  . COLONOSCOPY WITH PROPOFOL N/A 04/03/2020   Procedure: COLONOSCOPY WITH PROPOFOL;  Surgeon: Wonda Horner, MD;  Location: [facility] ENDOSCOPY;  Service: Endoscopy;  Laterality: N/A;  . I & D KNEE WITH POLY EXCHANGE Right 05/04/2016   IRRIGATION AND DEBRIDEMENT Right KNEE WITH UNICOMPARTMENTAL POLY EXCHANGE  . I & D KNEE WITH POLY EXCHANGE Right 05/04/2016   Procedure: IRRIGATION AND DEBRIDEMENT Right KNEE WITH UNICOMPARTMENTAL POLY EXCHANGE;  Surgeon: Renette Butters, MD;  Location: [place];  Service: Orthopedics;  Laterality: Right;  . JOINT REPLACEMENT    . LAPAROSCOPIC CHOLECYSTECTOMY  1995  . PARTIAL KNEE ARTHROPLASTY Right 04/06/2016   Procedure: UNICOMPARTMENTAL RIGHT KNEE;  Surgeon:  Renette Butters, MD;  Location: [place];  Service: Orthopedics;  Laterality: Right;  . POLYPECTOMY  04/03/2020   Procedure: POLYPECTOMY;  Surgeon: Wonda Horner, MD;  Location: [facility] ENDOSCOPY;  Service: Endoscopy;;  . STRABISMUS SURGERY Bilateral 06/10/2017   Procedure: REPAIR STRABISMUS BILATERAL;  Surgeon: Everitt Amber, MD;  Location: [place];  Service: Ophthalmology;  Laterality: Bilateral;  . TONSILLECTOMY AND ADENOIDECTOMY    . TOTAL KNEE ARTHROPLASTY Left 2008  . ULNAR NERVE TRANSPOSITION Right 1989   Social History   Socioeconomic History  . Marital status:  Married    Spouse name: Not on file  . Number of children: Not on file  . Years of education: Not on file  . Highest education level: Not on file  Occupational History  . Occupation: Retired Therapist, sports  Tobacco Use  . Smoking status: Former Smoker    Packs/day: 0.50    Years: 40.00    Pack years: 20.00    Types: Cigarettes    Quit date: 07/15/2011    Years since quitting: 9.6  . Smokeless tobacco: Former Systems developer    Types: Secondary school teacher  . Vaping Use: Never used  Substance and Sexual Activity  . Alcohol use: Yes    Comment: maybe 2 glases wine every other week  . Drug use: No  . Sexual activity: Yes  Other Topics Concern  . Not on file  Social History Narrative   Lives at home with spouse    Caffeine 2 cups of coffee daily    Right handed.    Social Determinants of Health   Financial Resource Strain: Not on file  Food Insecurity: Not on file  Transportation Needs: Not on file  Physical Activity: Not on file  Stress: Not on file  Social Connections: Not on file   Family History  Problem Relation Age of Onset  . CVA Mother   . Liver cancer Father    Allergies  Allergen Reactions  . Penicillins Anaphylaxis  . Alfalfa   . Dymista [Azelastine-Fluticasone] Other (See Comments)    Burning sensation   . Flonase [Fluticasone Propionate] Other (See Comments)    burning  . Hydrocodone Other (See Comments)    SWEATING  . Spironolactone Other (See Comments)    Makes chest swelling - or as pt puts it "makes his breasts swell and sore"  . Warfarin And Related Other (See Comments)    Pt states it makes his INR critical levels   Prior to Admission medications   Medication Sig Start Date End Date Taking? Authorizing Provider  atorvastatin (LIPITOR) 10 MG tablet Take 10 mg by mouth daily. 02/06/16  Yes [provider]  cloNIDine (CATAPRES) 0.1 MG tablet Take 0.1 mg by mouth daily as needed (Only takes if pt if bp  > 160).   Yes [provider]  cyclobenzaprine  (FLEXERIL) 10 MG tablet Take 10 mg by mouth at bedtime as needed for muscle spasms.  01/06/20  Yes [provider]  doxycycline (VIBRA-TABS) 100 MG tablet Take 100 mg by mouth 2 (two) times daily. Start date : 02/20/21 02/19/21  Yes [provider]  ENTRESTO 24-26 MG Take 1 tablet by mouth 2 (two) times daily. 03/13/20  Yes [provider]  gabapentin (NEURONTIN) 300 MG capsule Take 900 mg by mouth 2 (two) times daily.    Yes [provider]  metolazone (ZAROXOLYN) 2.5 MG tablet Take 2.5 mg by mouth daily.   Yes [provider]  metoprolol succinate (TOPROL-XL) 50 MG 24 hr tablet Take 50 mg by mouth daily. Take with or immediately following a meal.   Yes [provider]  omeprazole (PRILOSEC) 20 MG capsule Take 20 mg by mouth daily.   Yes [provider]  oxyCODONE-acetaminophen (ROXICET) 5-325 MG tablet Take 2 tablets by mouth every 4 (four) hours as needed. Patient taking differently: Take 2 tablets by mouth every 4 (four) hours as needed for severe pain. 04/07/16  Yes Renette Butters, MD  potassium chloride (MICRO-K) 10 MEQ CR capsule Take 10 mEq by mouth daily.   Yes [provider]  rivaroxaban (XARELTO) 20 MG TABS tablet Take 20 mg by mouth daily.   Yes [provider]  sertraline (ZOLOFT) 100 MG tablet Take 100 mg by mouth daily.   Yes [provider]  testosterone cypionate (DEPOTESTOSTERONE CYPIONATE) 200 MG/ML injection Inject 100 mg into the muscle every 14 (fourteen) days.  02/13/20  Yes [provider]  torsemide (DEMADEX) 100 MG tablet Take 100 mg by mouth daily. 09/29/18  Yes [provider]   CT SHOULDER LEFT WO CONTRAST  Result Date: 03/03/2021 CLINICAL DATA:  Left shoulder pain and limited range of motion for 3 months since a blow to the shoulder. EXAM: CT OF THE UPPER LEFT EXTREMITY WITHOUT CONTRAST TECHNIQUE: Multidetector CT imaging of the upper left extremity was performed  according to the standard protocol. COMPARISON:  None. FINDINGS: Bones/Joint/Cartilage No fracture or dislocation is identified. The patient has moderate acromioclavicular and mild appearing glenohumeral osteoarthritis. Pre acromion type os acromiale is identified. Cystic change and sclerosis in the acromion is identified and likely due to chronic abutment with the humeral head. Glenohumeral joint effusion is identified. Ligaments Suboptimally assessed by CT. Muscles and Tendons Although discrete measurement is difficult, there is a gas containing fluid collection in the subscapularis measuring approximately 6.7 cm transverse by 3 cm AP by 4.2 cm craniocaudal. A second gas containing fluid collection in the anterior aspect of the deltoid measures approximately 6.6 cm craniocaudal by 3 cm transverse by 1.5 cm AP. These collections may communicate. Although evaluation of the rotator cuff tendons is limited on CT, the supraspinatus and infraspinatus tendons appear completely torn with retraction to nearly the glenohumeral joint. There is severe infraspinatus and mild supraspinatus atrophy. Soft tissues As described above. IMPRESSION: Gas containing fluid collections in the subscapularis and deltoid highly suspicious for pyomyositis. The patient also has a glenohumeral joint effusion which is likely septic given these findings. Recommend MRI of the shoulder with and without contrast for further evaluation and to assess for osteomyelitis. Mild sclerosis and fragmentation of the acromion is likely due to chronic abutment with the humeral head. Evaluation of the rotator cuff is limited on CT but the supraspinatus and infraspinatus appear completely torn and retracted to the glenoid. There is severe infraspinatus and moderate supraspinatus fatty replacement. These results will be called to the ordering clinician or representative by the Radiologist Assistant, and communication documented in the PACS or [employer].  Electronically Signed   By: Inge Rise M.D.   On: 03/03/2021 15:00   Family History Reviewed and non-contributory, no pertinent history of problems with bleeding or anesthesia      Review of Systems 14 system ROS conducted and negative except for that noted in HPI   OBJECTIVE  Vitals: Patient Vitals for the past 8 hrs:  BP Temp Temp src Pulse Resp SpO2  03/04/21 0418 109/88 98.7 F (37.1 C) Oral 66 18  96 %  03/03/21 2330 (!) 124/114 98.7 F (37.1 C) Oral 98 18 97 %   General: Alert, no acute distress Cardiovascular: Warm extremities noted Respiratory: No cyanosis, no use of accessory musculature GI: No organomegaly, abdomen is soft and non-tender Skin: No lesions in the area of chief complaint other than those listed below in MSK exam.  Neurologic: *Sensation intact distally save for the below mentioned MSK exam Psychiatric: Patient is competent for consent with normal mood and affect Lymphatic: No swelling obvious and reported other than the area involved in the exam below  Extremities  RUE: Actively moves RUE without pain. Non-tender to palpation throughout. NVI LUE: Active forward elevation to about 30 degrees, passive to abut 60 degrees. Crepitus with range of motion. Axillary nerve is firing. Distal motor and sensory function intact.  RLE: Actively moves RLE without pain. Non-tender to palpation throughout. Endorses distal sensation. Warm well perfused foot.  LLE: Actively moves LLE without pain. Non-tender to palpation throughout. Endorses distal sensation. He has a diabetic foot ulcer on the 2nd toe of his left foot with mild surrounding erythema. He denies any tenderness to this. He endorses sensation just proximal to the ulcer. No signs of active drainage. Wiggles toes. Warm well perfused foot.     Test Results Imaging MRI of left shoulder showing complex joint effusion concerning for septic arthritis, with high grade glenohumeral osteoarthritis. Complete tear of  supraspinatus, infraspinatus, and subscapularis.   Labs cbc Recent Labs    03/03/21 1900 03/04/21 0421  WBC 12.5* 12.1*  HGB 15.1 13.8  HCT 46.4 42.1  PLT 348 340    Labs inflam No results for input(s): CRP in the last 72 hours.  Invalid input(s): ESR  Labs coag No results for input(s): INR, PTT in the last 72 hours.  Invalid input(s): PT  Recent Labs    03/03/21 1900 03/04/21 0421  NA 138 137  K 3.4* 3.5  CL 101 100  CO2 26 27  GLUCOSE 134* 108*  BUN 28* 28*  CREATININE 0.86 0.92  CALCIUM 8.7* 8.7*     ASSESSMENT AND PLAN: 75 y.o. male with the following: Left septic shoulder   This patient has been admitted to the medical service for IV antibiotics and medical management. Discussed plan of treatment with the patient. Recommend left shoulder washout with placement of antibiotic spacer today.   The risks benefits and alternatives were discussed with the patient including but not limited to the risks of nonoperative treatment, versus surgical intervention including infection, bleeding, nerve injury,  blood clots, cardiopulmonary complications, morbidity, mortality, among others, and they were willing to proceed.   We additionally specifically discussed risks of axillary nerve injury, infection, periprosthetic fracture, continued pain, limited function, need for revision surgery and longevity of implants prior to beginning procedure.Patient states his understanding of this and is willing to proceed.   - Plan: OR today with Dr. Griffin Basil - Keep NPO - Weight Bearing Status/Activity: NWB - Additional recommendations: ID consultation - VTE Prophylaxis: SCDs for now. Can resume Xarelto if hemoglobin stable tomorrow after surgery  - Pain control: PRN pain medications  - Follow-up plan: TBD - Procedures: Left shoulder washout with placement of antibiotic spacer   Noemi Chapel, PA-C 03/04/2021

## 2021-03-04 NOTE — Transfer of Care (Signed)
Immediate Anesthesia Transfer of Care Note  Patient: Willie Pineda  Procedure(s) Performed: Total Shoulder Arthroplasty (Left Shoulder)  Patient Location: PACU  Anesthesia Type:GA combined with regional for post-op pain  Level of Consciousness: awake, alert  and oriented  Airway & Oxygen Therapy: Patient Spontanous Breathing and Patient connected to face mask oxygen  Post-op Assessment: Report given to RN and Post -op Vital signs reviewed and stable  Post vital signs: Reviewed and stable  Last Vitals:  Vitals Value Taken Time  BP 101/66 03/04/21 1644  Temp    Pulse 77 03/04/21 1645  Resp 21 03/04/21 1645  SpO2 98 % 03/04/21 1645  Vitals shown include unvalidated device data.  Last Pain:  Vitals:   03/04/21 1345  TempSrc:   PainSc: 2       Patients Stated Pain Goal: 2 (88/75/79 7282)  Complications: No complications documented.

## 2021-03-04 NOTE — Anesthesia Postprocedure Evaluation (Signed)
Anesthesia Post Note  Patient: Willie Pineda  Procedure(s) Performed: Total Shoulder Arthroplasty (Left Shoulder)     Patient location during evaluation: PACU Anesthesia Type: General and Regional Level of consciousness: awake and alert Pain management: pain level controlled Vital Signs Assessment: post-procedure vital signs reviewed and stable Respiratory status: spontaneous breathing, nonlabored ventilation, respiratory function stable and patient connected to nasal cannula oxygen Cardiovascular status: blood pressure returned to baseline and stable Postop Assessment: no apparent nausea or vomiting Anesthetic complications: no   No complications documented.  Last Vitals:  Vitals:   03/04/21 1645 03/04/21 1700  BP: 104/63   Pulse: 78   Resp: 20 20  Temp: 37.3 C   SpO2: 98%     Last Pain:  Vitals:   03/04/21 1700  TempSrc:   PainSc: 4                  Barnet Glasgow

## 2021-03-04 NOTE — Interval H&P Note (Signed)
History and Physical Interval Note:  03/04/2021 2:24 PM  Willie Pineda  has presented today for surgery, with the diagnosis of LEFT SEPTIC SHOULDER.  The various methods of treatment have been discussed with the patient and family. After consideration of risks, benefits and other options for treatment, the patient has consented to  Procedure(s): Total Shoulder Arthroplasty (Left) as a surgical intervention.  The patient's history has been reviewed, patient examined, no change in status, stable for surgery.  I have reviewed the patient's chart and labs.  Questions were answered to the patient's satisfaction.     [name]

## 2021-03-05 ENCOUNTER — Encounter (HOSPITAL_COMMUNITY): Payer: Self-pay | Admitting: Orthopaedic Surgery

## 2021-03-05 DIAGNOSIS — Z880 Allergy status to penicillin: Secondary | ICD-10-CM

## 2021-03-05 DIAGNOSIS — I5032 Chronic diastolic (congestive) heart failure: Secondary | ICD-10-CM | POA: Diagnosis not present

## 2021-03-05 DIAGNOSIS — M009 Pyogenic arthritis, unspecified: Secondary | ICD-10-CM | POA: Diagnosis not present

## 2021-03-05 DIAGNOSIS — I482 Chronic atrial fibrillation, unspecified: Secondary | ICD-10-CM | POA: Diagnosis not present

## 2021-03-05 DIAGNOSIS — M8660 Other chronic osteomyelitis, unspecified site: Secondary | ICD-10-CM | POA: Diagnosis not present

## 2021-03-05 DIAGNOSIS — Z7901 Long term (current) use of anticoagulants: Secondary | ICD-10-CM | POA: Diagnosis not present

## 2021-03-05 LAB — CBC WITH DIFFERENTIAL/PLATELET
Abs Immature Granulocytes: 0.08 10*3/uL — ABNORMAL HIGH (ref 0.00–0.07)
Basophils Absolute: 0.1 10*3/uL (ref 0.0–0.1)
Basophils Relative: 1 %
Eosinophils Absolute: 0 10*3/uL (ref 0.0–0.5)
Eosinophils Relative: 0 %
HCT: 38.8 % — ABNORMAL LOW (ref 39.0–52.0)
Hemoglobin: 12.3 g/dL — ABNORMAL LOW (ref 13.0–17.0)
Immature Granulocytes: 1 %
Lymphocytes Relative: 18 %
Lymphs Abs: 2.3 10*3/uL (ref 0.7–4.0)
MCH: 32 pg (ref 26.0–34.0)
MCHC: 31.7 g/dL (ref 30.0–36.0)
MCV: 101 fL — ABNORMAL HIGH (ref 80.0–100.0)
Monocytes Absolute: 0.9 10*3/uL (ref 0.1–1.0)
Monocytes Relative: 7 %
Neutro Abs: 9.4 10*3/uL — ABNORMAL HIGH (ref 1.7–7.7)
Neutrophils Relative %: 73 %
Platelets: 304 10*3/uL (ref 150–400)
RBC: 3.84 MIL/uL — ABNORMAL LOW (ref 4.22–5.81)
RDW: 12.8 % (ref 11.5–15.5)
WBC: 12.7 10*3/uL — ABNORMAL HIGH (ref 4.0–10.5)
nRBC: 0 % (ref 0.0–0.2)

## 2021-03-05 LAB — BASIC METABOLIC PANEL
Anion gap: 9 (ref 5–15)
BUN: 21 mg/dL (ref 8–23)
CO2: 28 mmol/L (ref 22–32)
Calcium: 8.1 mg/dL — ABNORMAL LOW (ref 8.9–10.3)
Chloride: 103 mmol/L (ref 98–111)
Creatinine, Ser: 0.86 mg/dL (ref 0.61–1.24)
GFR, Estimated: >60 mL/min (ref >60)
Glucose, Bld: 115 mg/dL — ABNORMAL HIGH (ref 70–99)
Potassium: 3.9 mmol/L (ref 3.5–5.1)
Sodium: 140 mmol/L (ref 135–145)

## 2021-03-05 LAB — C-REACTIVE PROTEIN: CRP: 4.9 mg/dL — ABNORMAL HIGH (ref <1.0)

## 2021-03-05 LAB — HIV ANTIBODY (ROUTINE TESTING W REFLEX): HIV Screen 4th Generation wRfx: NONREACTIVE

## 2021-03-05 MED ORDER — SODIUM CHLORIDE 0.9 % IV BOLUS
500.0000 mL | Freq: Once | INTRAVENOUS | Status: AC
  Administered 2021-03-05: 500 mL via INTRAVENOUS

## 2021-03-05 NOTE — Plan of Care (Signed)
  Problem: Education: Goal: Knowledge of General Education information will improve Description: Including pain rating scale, medication(s)/side effects and non-pharmacologic comfort measures Outcome: Completed/Met   Problem: Activity: Goal: Risk for activity intolerance will decrease Outcome: Completed/Met   Problem: Elimination: Goal: Will not experience complications related to bowel motility Outcome: Completed/Met   Problem: Pain Managment: Goal: General experience of comfort will improve Outcome: Completed/Met

## 2021-03-05 NOTE — Progress Notes (Signed)
Cross-coverage note:   Pt developed asymptomatic hypotension. He was given 500 cc NS and holding parameters placed on his antihypertensives.

## 2021-03-05 NOTE — Progress Notes (Signed)
PROGRESS NOTE  Willie Pineda  HKV:[id number] DOB: 11-28-1946 DOA: 03/03/2021 PCP: Josetta Huddle, MD   Brief Narrative: Willie Pineda is a 75 y.o. male with a history of diet-controlled T2DM, HTN, CHF, atrial fibrillation, COPD, OSA, OA s/p multiple joint replacements who was sent to the ED 3/29 due to abnormal left shoulder CT as outpatient. He was being worked up with CT of the left shoulder in preparation for arthroplasty which showed evidence of abscess/septic joint. He was hemodynamically stable in the ED, started on vancomycin and admitted, subsequently undergoing washout with antibiotic spacer placement 3/30 by Dr. Griffin Basil.   Assessment & Plan: Principal Problem:   Septic arthritis of shoulder, left (HCC) Active Problems:   Atrial fibrillation, chronic (HCC)   Chronic anticoagulation   DM (diabetes mellitus), type 2 with neurological complications (HCC)   Essential hypertension   Hypokalemia   Prolonged QT interval   COPD (chronic obstructive pulmonary disease) (HCC)   Chronic CHF (congestive heart failure) (HCC)   Chronic osteomyelitis (HCC)   Diabetic foot infection ([place])   S/p total knee replacement, bilateral   Penicillin allergy  Septic arthritis, pyomyositis of left shoulder: s/p washout of approximately 6x7cm fluid collection 3/30 with antibiotic spacer placement. With gas formation and osteomyelitis changes on MRI.  - ID consulted for antibiotic recommendations, intraoperative cultures pending. Planning 6 weeks doxycycline, levaquin, ID clinic follow up arranged for 4/7 per ID.  - F/u orthopedics recommendations regarding wound care/drain/vac, possible DC tmrw. I discussed this plan of care with [facility] and Dr. Gale Journey today. - Continue pain control with tylenol (chronic Tx per pt) and prn oxyIR (pt prefers not to use dilaudid or other IV medications if possible). - Start diet, restart xarelto per orthopedics 3/31.  - NWB LUE with plans for orthopedics follow up in 1  week and delayed PT. Planning eventual reverse arthroplasty.   Left 2nd toe ulceration: Healing without SSTI.   Chronic atrial fibrillation:  - Monitor telemetry - Restart xarelto  Diet-controlled T2DM: No SSI required currently.   Chronic HFpEF, HTN: Euvolemic without angina or HF symptoms PTA. Optimized as far as reasonably possible for surgery. - Continue metoprolol, entresto, demadex. Hold parameters on entresto added with asymptomatic hypotension noted.  COPD: No exacerbation.  - Continue prns.   Hypokalemia: Supplementing  Prolonged QT:  - Telemetry  Obesity: Estimated body mass index is 36.09 kg/m as calculated from the following:   Height as of this encounter: 6' (1.829 m).   Weight as of this encounter: 120.7 kg.  DVT prophylaxis: Xarelto on hold, SCDs Code Status: Full Family Communication: Wife at bedside Disposition Plan:  Status is: Inpatient  Remains inpatient appropriate because:Ongoing active pain requiring inpatient pain management and Inpatient level of care appropriate due to severity of illness  Dispo: The patient is from: Home              Anticipated d/c is to: Home 4/1              Patient currently is not medically stable to d/c.   Difficult to place patient No  Consultants:   Orthopedics  ID  Procedures:   03/04/2021 Dr. Griffin Basil:  - Left shoulder antibiotic spacer hemiarthroplasty  - Left shoulder deep incision and debridement of abscess with osteomyelitis of humeral head - Incision debridement open of septic shoulder with synovectomy - Incision debridement of AC joint and osteomyelitis of distal clavicle  Antimicrobials:  Vancomycin 3/29 >   Doxycycline 3/18 - 3/29  Subjective: Pain is controlled, no fevers, working with therapy.   Objective: Vitals:   03/05/21 0703 03/05/21 1210 03/05/21 1505 03/05/21 1842  BP: 90/65 (!) 103/53 117/73 113/76  Pulse: 68 83 (!) 108 89  Resp:  16 16 20   Temp:  99.6 F (37.6 C) 98.7 F (37.1  C) 99.7 F (37.6 C)  TempSrc:  Oral Oral Oral  SpO2:  93% 96% 93%  Weight:      Height:        Intake/Output Summary (Last 24 hours) at 03/05/2021 1949 Last data filed at 03/05/2021 1800 Gross per 24 hour  Intake 1486.5 ml  Output 1975 ml  Net -488.5 ml   Filed Weights   03/03/21 1813 03/04/21 1248  Weight: 120.7 kg 120.7 kg   Gen: 75 y.o. male in no distress Pulm: Nonlabored breathing room air. Clear. CV: Regular rate and rhythm. No murmur, rub, or gallop. No JVD, no dependent edema. GI: Abdomen soft, non-tender, non-distended, with normoactive bowel sounds.  Ext: Warm, no deformities Skin: Left anterior shoulder wound with wound vac in place with good seal, minimal output.  Neuro: Alert and oriented. No focal neurological deficits. Psych: Judgement and insight appear fair. Mood euthymic & affect congruent. Behavior is appropriate.    Data Reviewed: I have personally reviewed following labs and imaging studies  CBC: Recent Labs  Lab 03/03/21 1900 03/04/21 0421 03/05/21 0347  WBC 12.5* 12.1* 12.7*  NEUTROABS  --   --  9.4*  HGB 15.1 13.8 12.3*  HCT 46.4 42.1 38.8*  MCV 97.9 97.5 101.0*  PLT 348 340 433   Basic Metabolic Panel: Recent Labs  Lab 03/03/21 1900 03/04/21 0421 03/05/21 0347  NA 138 137 140  K 3.4* 3.5 3.9  CL 101 100 103  CO2 26 27 28   GLUCOSE 134* 108* 115*  BUN 28* 28* 21  CREATININE 0.86 0.92 0.86  CALCIUM 8.7* 8.7* 8.1*   GFR: Estimated Creatinine Clearance: 99.5 mL/min (by C-G formula based on SCr of 0.86 mg/dL). Liver Function Tests: No results for input(s): AST, ALT, ALKPHOS, BILITOT, PROT, ALBUMIN in the last 168 hours. No results for input(s): LIPASE, AMYLASE in the last 168 hours. No results for input(s): AMMONIA in the last 168 hours. Coagulation Profile: No results for input(s): INR, PROTIME in the last 168 hours. Cardiac Enzymes: No results for input(s): CKTOTAL, CKMB, CKMBINDEX, TROPONINI in the last 168 hours. BNP (last 3  results) No results for input(s): PROBNP in the last 8760 hours. HbA1C: No results for input(s): HGBA1C in the last 72 hours. CBG: Recent Labs  Lab 03/04/21 1249  GLUCAP 112*   Lipid Profile: No results for input(s): CHOL, HDL, LDLCALC, TRIG, CHOLHDL, LDLDIRECT in the last 72 hours. Thyroid Function Tests: No results for input(s): TSH, T4TOTAL, FREET4, T3FREE, THYROIDAB in the last 72 hours. Anemia Panel: No results for input(s): VITAMINB12, FOLATE, FERRITIN, TIBC, IRON, RETICCTPCT in the last 72 hours. Urine analysis:    Component Value Date/Time   COLORURINE YELLOW 03/25/2016 1022   APPEARANCEUR CLEAR 03/25/2016 1022   LABSPEC 1.011 03/25/2016 1022   PHURINE 6.5 03/25/2016 1022   GLUCOSEU NEGATIVE 03/25/2016 1022   HGBUR NEGATIVE 03/25/2016 1022   BILIRUBINUR NEGATIVE 03/25/2016 1022   KETONESUR NEGATIVE 03/25/2016 1022   PROTEINUR NEGATIVE 03/25/2016 1022   NITRITE NEGATIVE 03/25/2016 1022   LEUKOCYTESUR NEGATIVE 03/25/2016 1022   Recent Results (from the past 240 hour(s))  Resp Panel by RT-PCR (Flu A&B, Covid) Nasopharyngeal Swab     Status:  None   Collection Time: 03/03/21  7:00 PM   Specimen: Nasopharyngeal Swab; Nasopharyngeal(NP) swabs in vial transport medium  Result Value Ref Range Status   SARS Coronavirus 2 by RT PCR NEGATIVE NEGATIVE Final    Comment: (NOTE) SARS-CoV-2 target nucleic acids are NOT DETECTED.  The SARS-CoV-2 RNA is generally detectable in upper respiratory specimens during the acute phase of infection. The lowest concentration of SARS-CoV-2 viral copies this assay can detect is 138 copies/mL. A negative result does not preclude SARS-Cov-2 infection and should not be used as the sole basis for treatment or other patient management decisions. A negative result may occur with  improper specimen collection/handling, submission of specimen other than nasopharyngeal swab, presence of viral mutation(s) within the areas targeted by this assay, and  inadequate number of viral copies(<138 copies/mL). A negative result must be combined with clinical observations, patient history, and epidemiological information. The expected result is Negative.  Fact Sheet for Patients:  EntrepreneurPulse.com.au  Fact Sheet for Healthcare Providers:  IncredibleEmployment.be  This test is no t yet approved or cleared by the [place] FDA and  has been authorized for detection and/or diagnosis of SARS-CoV-2 by FDA under an Emergency Use Authorization (EUA). This EUA will remain  in effect (meaning this test can be used) for the duration of the COVID-19 declaration under Section 564(b)(1) of the Act, 21 U.S.C.section 360bbb-3(b)(1), unless the authorization is terminated  or revoked sooner.       Influenza A by PCR NEGATIVE NEGATIVE Final   Influenza B by PCR NEGATIVE NEGATIVE Final    Comment: (NOTE) The Xpert Xpress SARS-CoV-2/FLU/RSV plus assay is intended as an aid in the diagnosis of influenza from Nasopharyngeal swab specimens and should not be used as a sole basis for treatment. Nasal washings and aspirates are unacceptable for Xpert Xpress SARS-CoV-2/FLU/RSV testing.  Fact Sheet for Patients: EntrepreneurPulse.com.au  Fact Sheet for Healthcare Providers: IncredibleEmployment.be  This test is not yet approved or cleared by the [place] FDA and has been authorized for detection and/or diagnosis of SARS-CoV-2 by FDA under an Emergency Use Authorization (EUA). This EUA will remain in effect (meaning this test can be used) for the duration of the COVID-19 declaration under Section 564(b)(1) of the Act, 21 U.S.C. section 360bbb-3(b)(1), unless the authorization is terminated or revoked.  Performed at [facility], [place] [address], [place], [place] [address]   Surgical pcr screen     Status: None   Collection Time: 03/04/21  2:16 AM    Specimen: Nasal Mucosa; Nasal Swab  Result Value Ref Range Status   MRSA, PCR NEGATIVE NEGATIVE Final   Staphylococcus aureus NEGATIVE NEGATIVE Final    Comment: (NOTE) The Xpert SA Assay (FDA approved for NASAL specimens in patients 14 years of age and older), is one component of a comprehensive surveillance program. It is not intended to diagnose infection nor to guide or monitor treatment. Performed at [facility], [place] [address], [place], [place] [address]   Aerobic/Anaerobic Culture w Gram Stain (surgical/deep wound)     Status: None (Preliminary result)   Collection Time: 03/04/21  3:10 PM   Specimen: Soft Tissue, Other  Result Value Ref Range Status   Specimen Description   Final    TISSUE  LEFT SHOULDER 1 Performed at [place] [address], [place], [place] [address]    Special Requests   Final    NONE Performed at [facility], [address]  [place], [place] [address]    Gram Stain   Final    MODERATE WBC PRESENT, PREDOMINANTLY PMN NO ORGANISMS SEEN Performed at [place] [address], [place], [place] [address]    Culture PENDING  Incomplete   Report Status PENDING  Incomplete  Aerobic/Anaerobic Culture w Gram Stain (surgical/deep wound)     Status: None (Preliminary result)   Collection Time: 03/04/21  3:18 PM   Specimen: Soft Tissue, Other  Result Value Ref Range Status   Specimen Description   Final    TISSUE  LEFT SHOULDER 2 Performed at [place] [address], [place], [place] [address]    Special Requests   Final    NONE Performed at [facility], [place] [address], [place], [place] [address]    Gram Stain   Final    FEW WBC PRESENT, PREDOMINANTLY PMN NO ORGANISMS SEEN Performed at [facility], [place] [address] [place], [place] [address]    Culture PENDING  Incomplete   Report Status PENDING  Incomplete  Aerobic/Anaerobic  Culture w Gram Stain (surgical/deep wound)     Status: None (Preliminary result)   Collection Time: 03/04/21  3:20 PM   Specimen: Soft Tissue, Other  Result Value Ref Range Status   Specimen Description   Final    TISSUE  LEFT SHOULDER 3 Performed at [place] [address], [place], [place] [address]    Special Requests   Final    NONE Performed at [facility], [place] [address] [place], [place] [address]    Gram Stain   Final    FEW WBC PRESENT, PREDOMINANTLY PMN NO ORGANISMS SEEN Performed at [facility], [place] [address] [place], [place] [address]    Culture PENDING  Incomplete   Report Status PENDING  Incomplete  Aerobic/Anaerobic Culture w Gram Stain (surgical/deep wound)     Status: None (Preliminary result)   Collection Time: 03/04/21  3:22 PM   Specimen: Soft Tissue, Other  Result Value Ref Range Status   Specimen Description   Final    TISSUE  LEFT SHOULDER 4 Performed at [facility], [place] [address] [place], [place] [address]    Special Requests   Final    NONE Performed at [facility], [place] [address], [place], [place] [address]    Gram Stain   Final    RARE WBC PRESENT, PREDOMINANTLY PMN NO ORGANISMS SEEN Performed at [facility], [place] [address] [place], [place] [address]    Culture PENDING  Incomplete   Report Status PENDING  Incomplete      Radiology Studies: MR Shoulder Left W Wo Contrast  Result Date: 03/04/2021 CLINICAL DATA:  Prior injury years ago. Shoulder pain. Evaluate for septic arthritis EXAM: MRI OF THE LEFT SHOULDER WITHOUT AND WITH CONTRAST TECHNIQUE: Multiplanar, multisequence MR imaging of the LEFT shoulder was performed before and after the administration of intravenous contrast. CONTRAST:  39mL GADAVIST GADOBUTROL 1 MMOL/ML IV SOLN COMPARISON:  None. FINDINGS: Rotator cuff: Complete tear of the supraspinatus and infraspinatus tendons with 5.2 cm of retraction.  Teres minor tendon is intact. Complete tear of the subscapularis tendon. Muscles: Severe atrophy of the supraspinatus and infraspinatus muscles. Mild atrophy of the subscapularis muscle. Biceps Long Head: Medial dislocation of the long head of biceps tendon. Tendinosis of the intra-articular portion of the long head of the biceps tendon with a partial tear. Severe synovitis  in the long head of the biceps tendon sheath. Acromioclavicular Joint: Severe arthropathy of the acromioclavicular joint. Type I acromion. Small amount of subacromial/subdeltoid bursal fluid. Glenohumeral Joint: Large complex joint effusion with thick enhancing synovitis. Large amount of joint fluid decompresses into the subcoracoid recess. high-grade partial-thickness cartilage loss of the glenohumeral joint. Labrum: Diffuse labral degeneration. Bones: No fracture or dislocation. Subchondral bone marrow edema in the inferior aspect of the glenoid likely reflecting reactive changes secondary to overlying cartilage loss and osteoarthritis, but early osteomyelitis. IMPRESSION: 1. Large complex joint effusion with thick enhancing synovitis decompresses into the subcoracoid recess. Overall appearance is most concerning for septic arthritis until proven otherwise. Recommend arthrocentesis. 2. High-grade partial-thickness cartilage loss of the glenohumeral joint. Subchondral bone marrow edema in the inferior aspect of the glenoid likely reflecting reactive changes secondary to overlying cartilage loss and osteoarthritis, but early osteomyelitis cannot be excluded. 3. Complete tear of the supraspinatus and infraspinatus tendons with 5.2 cm of retraction. 4. Complete tear of the subscapularis tendon. 5. Medial dislocation of the long head of the biceps tendon. Tendinosis of the intra-articular portion of the long head of the biceps tendon with a partial tear. Severe synovitis in the long head of the biceps tendon sheath which may be reactive versus  extension of intra-articular infection. Electronically Signed   By: Kathreen Devoid   On: 03/04/2021 08:12   DG Shoulder Left Port  Result Date: 03/04/2021 CLINICAL DATA:  Shoulder replacement EXAM: LEFT SHOULDER COMPARISON:  CT and MRI 03/03/2021 FINDINGS: Interval left shoulder antibiotic spacer hemiarthroplasty with normal alignment. Mild fragmentation at [facility] joint. Drainage catheter overlies the left shoulder. No fracture is seen. IMPRESSION: Interval left shoulder antibiotic spacer hemiarthroplasty. No fracture is seen. Electronically Signed   By: Donavan Foil M.D.   On: 03/04/2021 19:07    Scheduled Meds: . acetaminophen  1,000 mg Oral Q8H  . atorvastatin  10 mg Oral Daily  . docusate sodium  100 mg Oral BID  . gabapentin  900 mg Oral BID  . metoprolol succinate  50 mg Oral Daily  . potassium chloride  10 mEq Oral Daily  . rivaroxaban  20 mg Oral QPC breakfast  . sacubitril-valsartan  1 tablet Oral BID  . sertraline  100 mg Oral Daily  . torsemide  100 mg Oral Daily   Continuous Infusions: . aztreonam 2 g (03/05/21 1253)  . methocarbamol (ROBAXIN) IV 500 mg (03/04/21 1723)  . metronidazole Stopped (03/05/21 1500)  . vancomycin 1,000 mg (03/05/21 1115)     LOS: 2 days   Time spent: 25 minutes.  Patrecia Pour, MD [facility] [email or web address] 03/05/2021, 7:49 PM

## 2021-03-05 NOTE — Plan of Care (Signed)
  Problem: Health Behavior/Discharge Planning: Goal: Ability to manage health-related needs will improve Outcome: Progressing   Problem: Clinical Measurements: Goal: Cardiovascular complication will be avoided Outcome: Progressing   Problem: Activity: Goal: Risk for activity intolerance will decrease Outcome: Progressing   Problem: Pain Managment: Goal: General experience of comfort will improve Outcome: Progressing

## 2021-03-05 NOTE — Progress Notes (Addendum)
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 abx for the left shoulder process should cover this. He had no previous foot imaging that I can see but underwent soft tissue I&D on 3/17. The ulcer had almost healed up.  Given the recent doxycycline course, the culture for the left shoulder might be negative. But will see   #left shoulder chronic OM/septic arthritis Patient is s/p I&D 3/30. Big abscess and chronic changes visualized including AC OM Culture is in progress, in cluding afb/fungal Given chronicity and assymptomatic nature, I have also ordered quantiferon gold as he used to be a nurse Currently on empiric iv aztreonam (pcn allergy), vanc/flagyl  I have discussed with patient. Oral option is perfectly fine if cultures allow. We did review rare side effect of quinolone in regard to tendon rupture  I discussed plan with orthopedics team and primary team  -on discharge can d/c with doxycycline 100 mg po bid, and levofloxacin 750 mg po daily.  -avoid dairy products, calcium/magnesium/iron or polyvalent cations within 4 hours taking the antibiotics.  -sunscreen while taking doxy -duration plan is 6 weeks from 3/31 until 04/15/2021 -ID f/u as below   Clinic Follow Up Appt: 4/07 @ 4PM with Dr Tommy Medal  @  [facility]  cl[facility] [address], [place], [place] [address] Phone: [phone]     #hx pcn allergy Discuss with patient the morbidity of pcn allergy  -advise him to f/u outpatient for pcn allergy testing   I spent more than 35 minute reviewing data/chart, and coordinating care/arranging follow up, and >50% direct face to face time providing counseling/discussing diagnostics/treatment plan with patient    Principal Problem:   Septic arthritis of shoulder, left ([place]) Active Problems:   Atrial fibrillation, chronic ([facility])   Chronic anticoagulation   DM (diabetes mellitus), type 2 with neurological complications ([place])   Essential hypertension   Hypokalemia   Prolonged QT interval   COPD (chronic obstructive pulmonary disease) ([place])   Chronic CHF (congestive heart failure) ([place])   Chronic osteomyelitis ([place])   Diabetic foot infection ([place])   S/p total knee replacement, bilateral   Allergies  Allergen Reactions  . Penicillins Anaphylaxis  . Alfalfa   . Dymista [Azelastine-Fluticasone] Other (See Comments)    Burning sensation   . Flonase [Fluticasone Propionate] Other (See Comments)    burning  . Hydrocodone Other (See Comments)    SWEATING  . Spironolactone Other (See Comments)    Makes chest swelling - or as pt puts it "makes his breasts swell and sore"  . Warfarin And Related Other (See Comments)    Pt states it makes his INR critical levels    Scheduled Meds: . acetaminophen  1,000 mg Oral Q8H  . atorvastatin  10 mg Oral Daily  . docusate sodium  100 mg Oral BID  . gabapentin  900  mg Oral BID  . metoprolol succinate  50 mg Oral Daily  . potassium chloride  10 mEq Oral Daily  . rivaroxaban  20 mg Oral QPC breakfast  . sacubitril-valsartan  1 tablet Oral BID  . sertraline  100 mg Oral Daily  . torsemide  100 mg Oral Daily   Continuous Infusions: . aztreonam 2 g (03/05/21 1253)  . methocarbamol (ROBAXIN) IV 500 mg (03/04/21 1723)  . metronidazole 500 mg (03/05/21  1325)  . vancomycin 1,000 mg (03/05/21 1115)   PRN Meds:.bisacodyl, diphenhydrAMINE, ipratropium-albuterol, magnesium citrate, menthol-cetylpyridinium **OR** phenol, methocarbamol **OR** methocarbamol (ROBAXIN) IV, polyethylene glycol, senna-docusate   SUBJECTIVE: Patient underwent I&D left shoulder yesterday I reviewed operative note -- 6x7 cm abscess found subcoracoid region, and infection tracking down to bicipital groove. No obvious OM of the humeral head but based on mri likely present. Clear OM bony changes of the ac joint. abx spacer hemiarthroplasty placed  He felt well No n/v/diarrhea No fever/chill    Review of Systems: ROS All other ROS was negative, except mentioned above     OBJECTIVE: Vitals:   03/05/21 0356 03/05/21 0655 03/05/21 0703 03/05/21 1210  BP: 111/65 (!) 86/74 90/65 (!) 103/53  Pulse: 85 81 68 83  Resp:  17  16  Temp:  98.2 F (36.8 C)  99.6 F (37.6 C)  TempSrc:  Oral  Oral  SpO2:  92%  93%  Weight:      Height:       Body mass index is 36.09 kg/m.  Physical Exam General/constitutional: no distress, pleasant HEENT: Normocephalic, PER, Conj Clear, EOMI, Oropharynx clear Neck supple CV: rrr no mrg Lungs: clear to auscultation, normal respiratory effort Abd: Soft, Nontender Ext: no edema Skin: No Rash Neuro: nonfocal MSK: left shoulder bulky dressing with a drain draining serosanguinous fluid; left arm in sling  Central line presence: none    Lab Results Lab Results  Component Value Date   WBC 12.7 (H) 03/05/2021   HGB 12.3 (L) 03/05/2021   HCT 38.8 (L) 03/05/2021   MCV 101.0 (H) 03/05/2021   PLT 304 03/05/2021    Lab Results  Component Value Date   CREATININE 0.86 03/05/2021   BUN 21 03/05/2021   NA 140 03/05/2021   K 3.9 03/05/2021   CL 103 03/05/2021   CO2 28 03/05/2021    Lab Results  Component Value Date   ALT 28 03/25/2016   AST 33 03/25/2016   ALKPHOS 100 03/25/2016   BILITOT 1.2 03/25/2016       Microbiology: Recent Results (from the past 240 hour(s))  Resp Panel by RT-PCR (Flu A&B, Covid) Nasopharyngeal Swab     Status: None   Collection Time: 03/03/21  7:00 PM   Specimen: Nasopharyngeal Swab; Nasopharyngeal(NP) swabs in vial transport medium  Result Value Ref Range Status   SARS Coronavirus 2 by RT PCR NEGATIVE NEGATIVE Final    Comment: (NOTE) SARS-CoV-2 target nucleic acids are NOT DETECTED.  The SARS-CoV-2 RNA is generally detectable in upper respiratory specimens during the acute phase of infection. The lowest concentration of SARS-CoV-2 viral copies this assay can detect is 138 copies/mL. A negative result does not preclude SARS-Cov-2 infection and should not be used as the sole basis for treatment or other patient management decisions. A negative result may occur with  improper specimen collection/handling, submission of specimen other than nasopharyngeal swab, presence of viral mutation(s) within the areas targeted by this assay, and inadequate number of viral copies(<138 copies/mL). A negative result  must be combined with clinical observations, patient history, and epidemiological information. The expected result is Negative.  Fact Sheet for Patients:  EntrepreneurPulse.com.au  Fact Sheet for Healthcare Providers:  IncredibleEmployment.be  This test is no t yet approved or cleared by the [place] FDA and  has been authorized for detection and/or diagnosis of SARS-CoV-2 by FDA under an Emergency Use Authorization (EUA). This EUA will remain  in effect (meaning this test can be used) for the duration of the COVID-19 declaration under Section 564(b)(1) of the Act, 21 U.S.C.section 360bbb-3(b)(1), unless the authorization is terminated  or revoked sooner.       Influenza A by PCR NEGATIVE NEGATIVE Final   Influenza B by PCR NEGATIVE NEGATIVE Final    Comment: (NOTE) The Xpert Xpress SARS-CoV-2/FLU/RSV plus assay is  intended as an aid in the diagnosis of influenza from Nasopharyngeal swab specimens and should not be used as a sole basis for treatment. Nasal washings and aspirates are unacceptable for Xpert Xpress SARS-CoV-2/FLU/RSV testing.  Fact Sheet for Patients: EntrepreneurPulse.com.au  Fact Sheet for Healthcare Providers: IncredibleEmployment.be  This test is not yet approved or cleared by the [place] FDA and has been authorized for detection and/or diagnosis of SARS-CoV-2 by FDA under an Emergency Use Authorization (EUA). This EUA will remain in effect (meaning this test can be used) for the duration of the COVID-19 declaration under Section 564(b)(1) of the Act, 21 U.S.C. section 360bbb-3(b)(1), unless the authorization is terminated or revoked.  Performed at [facility], [place] [address] [place], [place] [address]   Surgical pcr screen     Status: None   Collection Time: 03/04/21  2:16 AM   Specimen: Nasal Mucosa; Nasal Swab  Result Value Ref Range Status   MRSA, PCR NEGATIVE NEGATIVE Final   Staphylococcus aureus NEGATIVE NEGATIVE Final    Comment: (NOTE) The Xpert SA Assay (FDA approved for NASAL specimens in patients 56 years of age and older), is one component of a comprehensive surveillance program. It is not intended to diagnose infection nor to guide or monitor treatment. Performed at [facility], [place] [address], [place], [place] [address]   Aerobic/Anaerobic Culture w Gram Stain (surgical/deep wound)     Status: None (Preliminary result)   Collection Time: 03/04/21  3:10 PM   Specimen: Soft Tissue, Other  Result Value Ref Range Status   Specimen Description   Final    TISSUE  LEFT SHOULDER 1 Performed at [facility] [address] [place], [place] [address]    Special Requests   Final    NONE Performed at [facility], [place] [address]  [place], [place] [address]    Gram Stain   Final    MODERATE WBC PRESENT, PREDOMINANTLY PMN NO ORGANISMS SEEN Performed at [facility], [place] [address] [place], [place] [address]    Culture PENDING  Incomplete   Report Status PENDING  Incomplete  Aerobic/Anaerobic Culture w Gram Stain (surgical/deep wound)     Status: None (Preliminary result)   Collection Time: 03/04/21  3:18 PM   Specimen: Soft Tissue, Other  Result Value Ref Range Status   Specimen Description   Final    TISSUE  LEFT SHOULDER 2 Performed at [facility] [address] [place], [place] [address]    Special Requests   Final    NONE Performed at [facility], [place] [address] [place],  [address]    Gram  Stain   Final    FEW WBC PRESENT, PREDOMINANTLY PMN NO ORGANISMS SEEN Performed at [facility], [place] [address] [place], [place] [address]    Culture PENDING  Incomplete   Report Status PENDING  Incomplete  Aerobic/Anaerobic Culture w Gram Stain (surgical/deep wound)     Status: None (Preliminary result)   Collection Time: 03/04/21  3:20 PM   Specimen: Soft Tissue, Other  Result Value Ref Range Status   Specimen Description   Final    TISSUE  LEFT SHOULDER 3 Performed at [facility] [address] [place], [place] [address]    Special Requests   Final    NONE Performed at [facility], [place] [address] [place], [place] [address]    Gram Stain   Final    FEW WBC PRESENT, PREDOMINANTLY PMN NO ORGANISMS SEEN Performed at [facility], [place] [address], [place], [place] [address]    Culture PENDING  Incomplete   Report Status PENDING  Incomplete  Aerobic/Anaerobic Culture w Gram Stain (surgical/deep wound)     Status: None (Preliminary result)   Collection Time: 03/04/21  3:22 PM   Specimen: Soft Tissue, Other  Result Value Ref Range Status   Specimen Description   Final    TISSUE  LEFT SHOULDER  4 Performed at [facility], [place] [address], [place], [place] [address]    Special Requests   Final    NONE Performed at [facility], [place] [address], [place], [place] [address]    Gram Stain   Final    RARE WBC PRESENT, PREDOMINANTLY PMN NO ORGANISMS SEEN Performed at [facility], [place] [address], [place], [place] [address]    Culture PENDING  Incomplete   Report Status PENDING  Incomplete     Serology: 3/31 hiv nonreactive; quantiferon gold pending  Micro: 3/30 surgical left shoulder cx in progress; afb/fungal cx in progress  Imaging: If present, new imagings (plain films, ct scans, and mri) have been personally visualized and interpreted; radiology reports have been reviewed. Decision making incorporated into the Impression / Recommendations.   Jabier Mutton, [place] for Infectious [place] [phone] pager    03/05/2021, 2:47 PM

## 2021-03-05 NOTE — Evaluation (Signed)
Occupational Therapy Evaluation Patient Details Name: CUONG MOORMAN MRN: [id number] DOB: 1946-03-10 Today's Date: 03/05/2021    History of Present Illness Allen GREENE DIODATO is a 75 y.o. male with a history of diet-controlled T2DM, HTN, CHF, atrial fibrillation, COPD, OSA, OA s/p multiple joint replacements who was sent to the ED 3/29 due to abnormal left shoulder CT as outpatient. He was being worked up with CT of the left shoulder in preparation for arthroplasty which showed evidence of abscess/septic joint. He was hemodynamically stable in the ED, started on vancomycin and admitted, subsequently undergoing washout with antibiotic spacer placement 3/30 by Dr. Griffin Basil.   Clinical Impression   Mr. Daysean Tinkham is a 75 year old man s/p left shoulder washout and placement of antibiotic spacer without functional use of left non-dominant upper extremity secondary to effects of surgery, nterscalene block and shoulder precautions. Therapist provided education and instruction to patient and spouse in regards to exercises, precautions, positioning, donning upper extremity clothing and bathing while maintaining shoulder precautions, i and donning/doffing sling. Patient and spouse verbalized understanding and demonstrated as needed. Recommended ice pack for comfort. Defer to nursing instructions in regards to wound care and if able to shower/bathe. Patient to follow up with MD for further therapy needs.      Follow Up Recommendations  No OT follow up;Follow surgeon's recommendation for DC plan and follow-up therapies    Equipment Recommendations  None recommended by OT    Recommendations for Other Services       Precautions / Restrictions Precautions Precautions: Shoulder Type of Shoulder Precautions: No AROM, No PROM, Shoulder Interventions: Shoulder abduction pillow;Off for dressing/bathing/exercises;At all times Precaution Booklet Issued: Yes (comment) Required Braces or Orthoses:  Sling Restrictions Weight Bearing Restrictions: Yes LUE Weight Bearing: Non weight bearing      Mobility Bed Mobility Overal bed mobility: Modified Independent                  Transfers Overall transfer level: Needs assistance Equipment used: None Transfers: Sit to/from [facility] Sit to Stand: Min guard Stand pivot transfers: Min guard            Balance Overall balance assessment: No apparent balance deficits (not formally assessed)                                         ADL either performed or assessed with clinical judgement   ADL Overall ADL's : Needs assistance/impaired Eating/Feeding: Set up   Grooming: Modified independent   Upper Body Bathing: Minimal assistance   Lower Body Bathing: Minimal assistance   Upper Body Dressing : Moderate assistance;Adhering to UE precautions   Lower Body Dressing: Minimal assistance   Toilet Transfer: Min guard   Toileting- Clothing Manipulation and Hygiene: Minimal assistance               Vision Patient Visual Report: No change from baseline       Perception     Praxis      Pertinent Vitals/Pain Pain Assessment: No/denies pain     Hand Dominance Right   Extremity/Trunk Assessment Upper Extremity Assessment Upper Extremity Assessment: LUE deficits/detail LUE Deficits / Details: Able to open and close fingers, extend/flex wrist, partial supination and elbow movement secondary to effects of block. LUE Coordination: WNL   Lower Extremity Assessment Lower Extremity Assessment: Overall WFL for tasks assessed   Cervical / Trunk  Assessment Cervical / Trunk Assessment: Normal   Communication Communication Communication: No difficulties   Cognition Arousal/Alertness: Awake/alert Behavior During Therapy: WFL for tasks assessed/performed Overall Cognitive Status: Within Functional Limits for tasks assessed                                      General Comments       Exercises     Shoulder Instructions Shoulder Instructions Donning/doffing shirt without moving shoulder: Caregiver independent with task;Patient able to independently direct caregiver Method for sponge bathing under operated UE: Patient able to independently direct caregiver Donning/doffing sling/immobilizer: Patient able to independently direct caregiver Correct positioning of sling/immobilizer: Patient able to independently direct caregiver ROM for elbow, wrist and digits of operated UE: Patient able to independently direct caregiver Sling wearing schedule (on at all times/off for ADL's): Patient able to independently direct caregiver Proper positioning of operated UE when showering: Patient able to independently direct caregiver Positioning of UE while sleeping: Patient able to independently direct caregiver    Home Living Family/patient expects to be discharged to:: Private residence Living Arrangements: Spouse/significant other Available Help at Discharge: Family;Available 24 hours/day Type of Home: House                                  Prior Functioning/Environment Level of Independence: Independent                 OT Problem List: Decreased strength;Decreased range of motion;Decreased knowledge of precautions;Impaired UE functional use;Obesity      OT Treatment/Interventions:      OT Goals(Current goals can be found in the care plan section) Acute Rehab OT Goals OT Goal Formulation: All assessment and education complete, DC therapy  OT Frequency:     Barriers to D/C:            Co-evaluation              AM-PAC OT "6 Clicks" Daily Activity     Outcome Measure Help from another person eating meals?: A Little Help from another person taking care of personal grooming?: A Little Help from another person toileting, which includes using toliet, bedpan, or urinal?: A Little Help from another person bathing (including  washing, rinsing, drying)?: A Little Help from another person to put on and taking off regular upper body clothing?: A Lot Help from another person to put on and taking off regular lower body clothing?: A Little 6 Click Score: 17   End of Session Nurse Communication:  (okay to see)  Activity Tolerance: Patient tolerated treatment well Patient left: in bed;with family/visitor present  OT Visit Diagnosis: Pain Pain - Right/Left: Left Pain - part of body: Shoulder                Time: 1113-1130 OT Time Calculation (min): 17 min Charges:  OT General Charges $OT Visit: 1 Visit OT Evaluation $OT Eval Low Complexity: 1 Low  Kyler Germer, OTR/L [place]  Office [phone] Pager: [phone]   Lenward Chancellor 03/05/2021, 12:53 PM

## 2021-03-05 NOTE — Plan of Care (Signed)
  Problem: Health Behavior/Discharge Planning: Goal: Ability to manage health-related needs will improve Outcome: Progressing   Problem: Clinical Measurements: Goal: Ability to maintain clinical measurements within normal limits will improve Outcome: Progressing Goal: Will remain free from infection Outcome: Progressing Goal: Diagnostic test results will improve Outcome: Progressing Goal: Respiratory complications will improve Outcome: Progressing Goal: Cardiovascular complication will be avoided Outcome: Progressing   Problem: Nutrition: Goal: Adequate nutrition will be maintained Outcome: Progressing   Problem: Coping: Goal: Level of anxiety will decrease Outcome: Progressing   Problem: Elimination: Goal: Will not experience complications related to urinary retention Outcome: Progressing   Problem: Safety: Goal: Ability to remain free from injury will improve Outcome: Progressing   Problem: Skin Integrity: Goal: Risk for impaired skin integrity will decrease Outcome: Progressing

## 2021-03-05 NOTE — Progress Notes (Signed)
   ORTHOPAEDIC PROGRESS NOTE  s/p Procedure(s): Total Shoulder Arthroplasty  SUBJECTIVE: Reports minimal pain about the operative site. Resting comfortable in hospital bed. No chest pain. No SOB. No nausea/vomiting. No other complaints.  OBJECTIVE: PE: General: resting in hospital bed, NAD Left upper extremity: prevena wound vac in place with good seal. No drainage in canister. Hemovac in place as well. Approximately 150 mL of bloody drainage in canister. Sling well fitting. Axillary nerve sensation/motor altered in setting of block and unable to be fully tested.  Distal motor and sensory altered in setting of block. Warm well perfused hand.   Vitals:   03/05/21 0655 03/05/21 0703  BP: (!) 86/74 90/65  Pulse: 81 68  Resp: 17   Temp: 98.2 F (36.8 C)   SpO2: 92%      ASSESSMENT: Willie Pineda is a 75 y.o. male status post: - Left shoulder antibiotic spacer hemiarthroplasty  - Left shoulder deep incision and debridement of abscess with osteomyelitis of humeral head - Incision debridement open of septic shoulder with synovectomy - Incision debridement of AC joint and osteomyelitis of distal clavicle  PLAN: Weightbearing: NWB LUE Insicional and dressing care: Reinforce dressings as needed Will keep prevena and hemovac in place for now. Possibly remove tomorrow.  Orthopedic device(s): Sling VTE prophylaxis: DVT prophylaxis per primary team, no orthopedic contraindications can restart baseline Xarelto today if Hgb stable. Pain control: PRN pain medications, preferring oral medications Follow - up plan: 1 week in office with Dr. Damien Fusi information:  Dr. [name], Noemi Chapel PA-C, After hours and holidays please check [email or web address] for group call information for [facility]   Noemi Chapel, PA-C 03/05/2021

## 2021-03-06 DIAGNOSIS — M009 Pyogenic arthritis, unspecified: Secondary | ICD-10-CM | POA: Diagnosis not present

## 2021-03-06 DIAGNOSIS — I5032 Chronic diastolic (congestive) heart failure: Secondary | ICD-10-CM | POA: Diagnosis not present

## 2021-03-06 DIAGNOSIS — I482 Chronic atrial fibrillation, unspecified: Secondary | ICD-10-CM | POA: Diagnosis not present

## 2021-03-06 DIAGNOSIS — Z7901 Long term (current) use of anticoagulants: Secondary | ICD-10-CM | POA: Diagnosis not present

## 2021-03-06 LAB — BASIC METABOLIC PANEL
Anion gap: 8 (ref 5–15)
BUN: 18 mg/dL (ref 8–23)
CO2: 24 mmol/L (ref 22–32)
Calcium: 7.7 mg/dL — ABNORMAL LOW (ref 8.9–10.3)
Chloride: 104 mmol/L (ref 98–111)
Creatinine, Ser: 0.86 mg/dL (ref 0.61–1.24)
GFR, Estimated: >60 mL/min (ref >60)
Glucose, Bld: 133 mg/dL — ABNORMAL HIGH (ref 70–99)
Potassium: 3.3 mmol/L — ABNORMAL LOW (ref 3.5–5.1)
Sodium: 136 mmol/L (ref 135–145)

## 2021-03-06 LAB — ACID FAST SMEAR (AFB, MYCOBACTERIA)
Acid Fast Smear: NEGATIVE
Acid Fast Smear: NEGATIVE
Acid Fast Smear: NEGATIVE
Acid Fast Smear: NEGATIVE

## 2021-03-06 LAB — CBC WITH DIFFERENTIAL/PLATELET
Abs Immature Granulocytes: 0.05 10*3/uL (ref 0.00–0.07)
Basophils Absolute: 0.1 10*3/uL (ref 0.0–0.1)
Basophils Relative: 1 %
Eosinophils Absolute: 0 10*3/uL (ref 0.0–0.5)
Eosinophils Relative: 0 %
HCT: 36.7 % — ABNORMAL LOW (ref 39.0–52.0)
Hemoglobin: 11.7 g/dL — ABNORMAL LOW (ref 13.0–17.0)
Immature Granulocytes: 0 %
Lymphocytes Relative: 17 %
Lymphs Abs: 2 10*3/uL (ref 0.7–4.0)
MCH: 32.2 pg (ref 26.0–34.0)
MCHC: 31.9 g/dL (ref 30.0–36.0)
MCV: 101.1 fL — ABNORMAL HIGH (ref 80.0–100.0)
Monocytes Absolute: 1.1 10*3/uL — ABNORMAL HIGH (ref 0.1–1.0)
Monocytes Relative: 10 %
Neutro Abs: 8.5 10*3/uL — ABNORMAL HIGH (ref 1.7–7.7)
Neutrophils Relative %: 72 %
Platelets: 282 10*3/uL (ref 150–400)
RBC: 3.63 MIL/uL — ABNORMAL LOW (ref 4.22–5.81)
RDW: 12.5 % (ref 11.5–15.5)
WBC: 11.8 10*3/uL — ABNORMAL HIGH (ref 4.0–10.5)
nRBC: 0 % (ref 0.0–0.2)

## 2021-03-06 MED ORDER — LEVOFLOXACIN 750 MG PO TABS: 750.0000 mg | ORAL_TABLET | Freq: Every day | ORAL | 0 refills | Status: DC

## 2021-03-06 MED ORDER — ACETAMINOPHEN 325 MG PO TABS
650.0000 mg | ORAL_TABLET | Freq: Four times a day (QID) | ORAL | Status: DC | PRN
  Administered 2021-03-06 (×2): 650 mg via ORAL
  Filled 2021-03-06 (×2): qty 2

## 2021-03-06 MED ORDER — DOXYCYCLINE HYCLATE 100 MG PO TABS: 100.0000 mg | ORAL_TABLET | Freq: Two times a day (BID) | ORAL | 0 refills | Status: DC

## 2021-03-06 MED ORDER — OXYCODONE HCL 5 MG PO TABS: ORAL_TABLET | ORAL | 0 refills | Status: AC

## 2021-03-06 MED ORDER — ACETAMINOPHEN 500 MG PO TABS: 1000.0000 mg | ORAL_TABLET | Freq: Three times a day (TID) | ORAL | 0 refills | Status: AC

## 2021-03-06 NOTE — Progress Notes (Signed)
Wound vac discontinued by Ortho MD, dressing supplies provided to pt and family to manage dressing changes at home. ABT infusing. Will cont to monitor MD to round and discuss final discharge plans and arrangement.

## 2021-03-06 NOTE — Progress Notes (Signed)
Chart check   Left shoulder om/septic arthritis s/p I&D cx ngtd   -ok to d/c from id standpoint -abx and follow up plan as per 3/31 note -we can review cultures on f/u with id clinic on 4/07 with dr Tommy Medal

## 2021-03-06 NOTE — Care Management Important Message (Signed)
Important Message  Patient Details IM Letter given to the Patient. Name: OTHON Pineda MRN: [id number] Date of Birth: 03/22/1946   Medicare Important Message Given:  Yes     Kerin Salen 03/06/2021, 10:45 AM

## 2021-03-06 NOTE — Progress Notes (Signed)
   ORTHOPAEDIC PROGRESS NOTE  s/p Procedure(s): Total Shoulder Arthroplasty  SUBJECTIVE: Reports some more pain in the shoulder today now that the nerve block has worn off. Resting comfortable in hospital bed. No chest pain. No SOB. No nausea/vomiting. No other complaints.  OBJECTIVE: PE: General: resting in hospital bed, NAD Left upper extremity: Prevena wound vac removed. Nylon sutures in place. Hemovac removed as well. Sling well fitting. Axillary nerve sensation preserved. Distal motor and sensory function intact. Warm well perfused hand.    Vitals:   03/05/21 2248 03/06/21 0505  BP: 105/69 (!) 145/86  Pulse: 67 (!) 56  Resp:  19  Temp: 98.8 F (37.1 C) 98.2 F (36.8 C)  SpO2: 92% 91%     ASSESSMENT: Willie Pineda is a 75 y.o. male status post: - Left shoulder antibiotic spacer hemiarthroplasty  - Left shoulder deep incision and debridement of abscess with osteomyelitis of humeral head - Incision debridement open of septic shoulder with synovectomy - Incision debridement of AC joint and osteomyelitis of distal clavicle  PLAN: Weightbearing: NWB LUE Insicional and dressing care: Reinforce dressings as needed Wound vac and hemovac removed today. Bulky dressing placed.  Orthopedic device(s): Sling VTE prophylaxis: DVT prophylaxis per primary team, no orthopedic contraindications can restart baseline Xarelto Pain control: PRN pain medications, preferring oral medications Follow - up plan: 1 week in office with Dr. Damien Fusi information:  Dr. [name], Noemi Chapel PA-C, After hours and holidays please check [email or web address] for group call information for [facility]  Cleared for discharge from orthopedics standpoint once cleared by medicine team and ID.   Noemi Chapel, PA-C 03/06/2021

## 2021-03-06 NOTE — Discharge Instructions (Signed)
[name] MD, MPH Noemi Chapel, PA-C [place] [address], [address] [phone] (tel)   [phone] (fax)   POST-OPERATIVE INSTRUCTIONS - TOTAL SHOULDER REPLACEMENT    WOUND CARE . Change your dressings daily or every other day . You may remove the dressing before you shower . Okay to get shoulder wet in the shower, but do not soak it . Pat very dry after you shoulder and replace new dressing  . There may be a small amount of fluid/bleeding leaking at the surgical site.  o This is normal . Use the Cryocuff or Ice as often as possible for the first 7 days, then as needed for pain relief. Always keep a towel, ACE wrap or other barrier between the cooling unit and your skin.  . You may shower on Post-Op Day #3. Gently pat the area dry. Do not soak the shoulder in water or submerge it. Keep dry incisions as dry as possible.  . Do not go swimming in the pool or ocean until 4 weeks after surgery or when otherwise instructed.     SHOWERING: - You may shower on Post-Op Day #3. - Remove the bulky dressing before showering - Okay to get shoulder wet, but pat dry  - You may remove the sling for showering, but keep a water resistant pillow under the arm to keep both the  elbow and shoulder away from the body (mimicking the abduction sling).  - Gently pat the area dry.  - Do not soak the shoulder in water. Do not go swimming in the pool or ocean until your sutures are removed. - KEEP THE INCISIONS CLEAN AND DRY.  EXERCISES ? Wear the sling at all times except when doing your exercises.  o You may remove the sling for showering, but keep the arm across the chest or in a secondary sling.    ? Accidental/Purposeful External Rotation and shoulder flexion (reaching behind you) is to be avoided at all costs for the first month. ? It is ok to come out of your sling if your are sitting and have assistance for eating.   ? Do not lift anything heavier than 1 pound  until we discuss it further in clinic. ? Please perform the exercises (as taught by the occupational therapist):   Marland Kitchen Elbow / Hand / Wrist  Range of Motion Exercises . Grip strengthening   REGIONAL ANESTHESIA (NERVE BLOCKS) . The anesthesia team may have performed a nerve block for you if safe in the setting of your care.  This is a great tool used to minimize pain.  Typically the block may start wearing off overnight but the long acting medicine may last for 3-4 days.  The nerve block wearing off can be a challenging period but please utilize your as needed pain medications to try and manage this period.    POST-OP MEDICATIONS- Multimodal approach to pain control . In general your pain will be controlled with a combination of substances.  Prescriptions unless otherwise discussed are electronically sent to your pharmacy.  This is a carefully made plan we use to minimize narcotic use.      ? Acetaminophen - Non-narcotic pain medicine taken on a scheduled basis  ? Oxycodone - This is a strong narcotic, to be used only on an "as needed" basis for pain.    FOLLOW-UP ? If you develop a Fever (>101.5), Redness or Drainage from the surgical incision site, please call our office to arrange for an  evaluation. ? Please call the office to schedule a follow-up appointment for a wound check, 7-10 days post-operatively.  IF YOU HAVE ANY QUESTIONS, PLEASE FEEL FREE TO CALL OUR OFFICE.  HELPFUL INFORMATION  . If you had a block, it will wear off between 8-24 hrs postop typically.  This is period when your pain may go from nearly zero to the pain you would have had post-op without the block.  This is an abrupt transition but nothing dangerous is happening.  You may take an extra dose of narcotic when this happens.  ? Your arm will be in a sling following surgery. You will be in this sling for the next 4 weeks.  We will let you know the exact duration at your follow-up visit.  ? You may be more comfortable  sleeping in a semi-seated position the first few nights following surgery.  Keep a pillow propped under the elbow and forearm for comfort.  If you have a recliner type of chair it might be beneficial.  If not that is fine too, but it would be helpful to sleep propped up with pillows behind your operated shoulder as well under your elbow and forearm.  This will reduce pulling on the suture lines.  ? When dressing, put your operative arm in the sleeve first.  When getting undressed, take your operative arm out last.  Loose fitting, button-down shirts are recommended.  ? In most states it is against the law to drive while your arm is in a sling. And certainly against the law to drive while taking narcotics.  ? You may return to work/school in the next couple of days when you feel up to it. Desk work and typing in the sling is fine.  ? We suggest you use the pain medication the first night prior to going to bed, in order to ease any pain when the anesthesia wears off. You should avoid taking pain medications on an empty stomach as it will make you nauseous.  ? Do not drink alcoholic beverages or take illicit drugs when taking pain medications.  ? Pain medication may make you constipated.  Below are a few solutions to try in this order: - Decrease the amount of pain medication if you aren't having pain. - Drink lots of decaffeinated fluids. - Drink prune juice and/or each dried prunes  o If the first 3 don't work start with additional solutions - Take Colace - an over-the-counter stool softener - Take Senokot - an over-the-counter laxative - Take Miralax - a stronger over-the-counter laxative      For more information including helpful videos and documents visit our website:   [email or web address]

## 2021-03-06 NOTE — Progress Notes (Signed)
Pt discharged to home, instructions reviewed with pt and family acknowledged understanding of instructions. SRP, RN

## 2021-03-06 NOTE — Discharge Summary (Signed)
Physician Discharge Summary  Willie Pineda LYY:[id number] DOB: July 31, 1946 DOA: 03/03/2021  PCP: Josetta Huddle, MD  Admit date: 03/03/2021 Discharge date: 03/06/2021  Admitted From: Home Disposition: Home   Recommendations for Outpatient Follow-up:  1. Follow up with PCP in 1-2 weeks 2. Follow up with ID as scheduled 4/7, discharged on doxycycline and levaquin per ID recommendations with intraoperative and blood cultures NGTD at time of discharge (taken after pt already on abx)  Home Health: None Equipment/Devices: L arm sling Discharge Condition: Stable CODE STATUS: Full Diet recommendation: Heart healthy, carb-modified  Brief/Interim Summary: Willie Pineda is a 75 y.o. male with a history of diet-controlled T2DM, HTN, CHF, atrial fibrillation, COPD, OSA, OA s/p multiple joint replacements who was sent to the ED 3/29 due to abnormal left shoulder CT as outpatient. He was being worked up with CT of the left shoulder in preparation for arthroplasty which showed evidence of abscess/septic joint. He was hemodynamically stable in the ED, started on vancomycin and admitted, subsequently undergoing washout with antibiotic spacer placement 3/30 by Dr. Griffin Basil. Intraoperative cultures have remained negative, so empiric antibiotics are prescribed at discharge. He has remained afebrile with controlled pain. Prevena and wound vac have been removed by orthopedics with plans to follow up with orthopedics in 1 week.   Discharge Diagnoses:  Principal Problem:   Septic arthritis of shoulder, left (HCC) Active Problems:   Atrial fibrillation, chronic (HCC)   Chronic anticoagulation   DM (diabetes mellitus), type 2 with neurological complications (HCC)   Essential hypertension   Hypokalemia   Prolonged QT interval   COPD (chronic obstructive pulmonary disease) (HCC)   Chronic CHF (congestive heart failure) (HCC)   Chronic osteomyelitis (HCC)   Diabetic foot infection ([place])   S/p total knee  replacement, bilateral   Penicillin allergy  Septic arthritis, pyomyositis of left shoulder: s/p washout of approximately 6x7cm fluid collection 3/30 with antibiotic spacer placement. With gas formation and osteomyelitis changes on MRI.  - ID consulted for antibiotic recommendations, intraoperative cultures pending. Planning 6 weeks doxycycline, levaquin, ID clinic follow up arranged for 4/7 per ID.  - Orthopedics removed wound vac, placed bulky dressing and will follow up in 1 week. Pain medications ordered per orthopedics, NEW LUE in sling until f/u.   Left 2nd toe ulceration: Healing without SSTI.  - Encouraged to follow up with vascular surgery as arterial insufficiency is likely contributing to this.  Chronic atrial fibrillation: Rate controlled. - Restarted xarelto  Diet-controlled T2DM: No changes   Chronic HFpEF, HTN: Euvolemic without angina or HF symptoms PTA.   - Continue metoprolol, entresto, demadex home medications.   COPD: No exacerbation.  - Continue prns.   Hypokalemia: Supplemented and improved. Suggest recheck at follow up.  Prolonged QT: Improved based on telemetry readings after admission.  Obesity: Estimated body mass index is 36.09 kg/m   Discharge Instructions Discharge Instructions    Diet - low sodium heart healthy   Complete by: As directed    Discharge instructions   Complete by: As directed    Per infectious disease recommendations, please take doxycycline twice daily and levaquin once daily for 6 total weeks (end date is 04/15/2021). These were sent to your pharmacy. Doxycycline can cause photosensitivity, so use sunscreen/avoid sun exposure. Levaquin has been associated with tendinitis, tendon rupture, neuropathy, CNS effects, and C. diff infection.   Follow up with the ID clinic is scheduled for you on 4/7 at 4:00pm.   Continue your home medications, follow up with your  PCP in the next 1-2 weeks and with orthopedics as they recommend. Seek  medical attention right away for worsening pain, fever, or any chest pain, leg swelling, or shortness of breath.   Discharge wound care:   Complete by: As directed    per orthopedics   Increase activity slowly   Complete by: As directed      Allergies as of 03/06/2021      Reactions   Penicillins Anaphylaxis   Alfalfa    Dymista [azelastine-fluticasone] Other (See Comments)   Burning sensation    Flonase [fluticasone Propionate] Other (See Comments)   burning   Hydrocodone Other (See Comments)   SWEATING   Spironolactone Other (See Comments)   Makes chest swelling - or as pt puts it "makes his breasts swell and sore"   Warfarin And Related Other (See Comments)   Pt states it makes his INR critical levels      Medication List    TAKE these medications   acetaminophen 500 MG tablet Commonly known as: TYLENOL Take 2 tablets (1,000 mg total) by mouth every 8 (eight) hours for 14 days.   atorvastatin 10 MG tablet Commonly known as: LIPITOR Take 10 mg by mouth daily.   cloNIDine 0.1 MG tablet Commonly known as: CATAPRES Take 0.1 mg by mouth daily as needed (Only takes if pt if bp  > 160).   cyclobenzaprine 10 MG tablet Commonly known as: FLEXERIL Take 10 mg by mouth at bedtime as needed for muscle spasms.   doxycycline 100 MG tablet Commonly known as: VIBRA-TABS Take 1 tablet (100 mg total) by mouth 2 (two) times daily. End date 04/15/2021 What changed: additional instructions   Entresto 24-26 MG Generic drug: sacubitril-valsartan Take 1 tablet by mouth 2 (two) times daily.   gabapentin 300 MG capsule Commonly known as: NEURONTIN Take 900 mg by mouth 2 (two) times daily.   levofloxacin 750 MG tablet Commonly known as: Levaquin Take 1 tablet (750 mg total) by mouth daily. End date 04/15/2021   metolazone 2.5 MG tablet Commonly known as: ZAROXOLYN Take 2.5 mg by mouth daily.   metoprolol succinate 50 MG 24 hr tablet Commonly known as: TOPROL-XL Take 50 mg by  mouth daily. Take with or immediately following a meal.   omeprazole 20 MG capsule Commonly known as: PRILOSEC Take 20 mg by mouth daily.   oxyCODONE 5 MG immediate release tablet Commonly known as: Oxy IR/ROXICODONE Take 1-2 pills every 6 hrs as needed for severe/breakthrough pain   oxyCODONE-acetaminophen 5-325 MG tablet Commonly known as: Roxicet Take 2 tablets by mouth every 4 (four) hours as needed. What changed: reasons to take this   potassium chloride 10 MEQ CR capsule Commonly known as: MICRO-K Take 10 mEq by mouth daily.   rivaroxaban 20 MG Tabs tablet Commonly known as: XARELTO Take 20 mg by mouth daily.   sertraline 100 MG tablet Commonly known as: ZOLOFT Take 100 mg by mouth daily.   testosterone cypionate 200 MG/ML injection Commonly known as: DEPOTESTOSTERONE CYPIONATE Inject 100 mg into the muscle every 14 (fourteen) days.   torsemide 100 MG tablet Commonly known as: DEMADEX Take 100 mg by mouth daily.            Discharge Care Instructions  (From admission, onward)         Start     Ordered   03/06/21 0000  Discharge wound care:       Comments: per orthopedics   03/06/21 4401  Follow-up Information    Josetta Huddle, MD Follow up.   Specialty: Internal Medicine Contact information: [address] [place] [address] [phone]        Tommy Medal, Lavell Islam, MD. Go on 03/12/2021.   Specialty: Infectious Diseases Why: 4:00 pm Contact information: [address] [place] [address] [phone]        [name], MD. Schedule an appointment as soon as possible for a visit in 1 week(s).   Specialty: Orthopedic Surgery Contact information: [address] [name] [address] [phone]              Allergies  Allergen Reactions  . Penicillins Anaphylaxis  . Alfalfa   . Dymista [Azelastine-Fluticasone] Other (See Comments)    Burning sensation   . Flonase  [Fluticasone Propionate] Other (See Comments)    burning  . Hydrocodone Other (See Comments)    SWEATING  . Spironolactone Other (See Comments)    Makes chest swelling - or as pt puts it "makes his breasts swell and sore"  . Warfarin And Related Other (See Comments)    Pt states it makes his INR critical levels    Consultations:  Orthopedics  ID  Procedures/Studies: CT SHOULDER LEFT WO CONTRAST  Result Date: 03/03/2021 CLINICAL DATA:  Left shoulder pain and limited range of motion for 3 months since a blow to the shoulder. EXAM: CT OF THE UPPER LEFT EXTREMITY WITHOUT CONTRAST TECHNIQUE: Multidetector CT imaging of the upper left extremity was performed according to the standard protocol. COMPARISON:  None. FINDINGS: Bones/Joint/Cartilage No fracture or dislocation is identified. The patient has moderate acromioclavicular and mild appearing glenohumeral osteoarthritis. Pre acromion type os acromiale is identified. Cystic change and sclerosis in the acromion is identified and likely due to chronic abutment with the humeral head. Glenohumeral joint effusion is identified. Ligaments Suboptimally assessed by CT. Muscles and Tendons Although discrete measurement is difficult, there is a gas containing fluid collection in the subscapularis measuring approximately 6.7 cm transverse by 3 cm AP by 4.2 cm craniocaudal. A second gas containing fluid collection in the anterior aspect of the deltoid measures approximately 6.6 cm craniocaudal by 3 cm transverse by 1.5 cm AP. These collections may communicate. Although evaluation of the rotator cuff tendons is limited on CT, the supraspinatus and infraspinatus tendons appear completely torn with retraction to nearly the glenohumeral joint. There is severe infraspinatus and mild supraspinatus atrophy. Soft tissues As described above. IMPRESSION: Gas containing fluid collections in the subscapularis and deltoid highly suspicious for pyomyositis. The patient also  has a glenohumeral joint effusion which is likely septic given these findings. Recommend MRI of the shoulder with and without contrast for further evaluation and to assess for osteomyelitis. Mild sclerosis and fragmentation of the acromion is likely due to chronic abutment with the humeral head. Evaluation of the rotator cuff is limited on CT but the supraspinatus and infraspinatus appear completely torn and retracted to the glenoid. There is severe infraspinatus and moderate supraspinatus fatty replacement. These results will be called to the ordering clinician or representative by the Radiologist Assistant, and communication documented in the PACS or [employer]. Electronically Signed   By: Inge Rise M.D.   On: 03/03/2021 15:00   MR Shoulder Left W Wo Contrast  Result Date: 03/04/2021 CLINICAL DATA:  Prior injury years ago. Shoulder pain. Evaluate for septic arthritis EXAM: MRI OF THE LEFT SHOULDER WITHOUT AND WITH CONTRAST TECHNIQUE: Multiplanar, multisequence MR imaging of  the LEFT shoulder was performed before and after the administration of intravenous contrast. CONTRAST:  72mL GADAVIST GADOBUTROL 1 MMOL/ML IV SOLN COMPARISON:  None. FINDINGS: Rotator cuff: Complete tear of the supraspinatus and infraspinatus tendons with 5.2 cm of retraction. Teres minor tendon is intact. Complete tear of the subscapularis tendon. Muscles: Severe atrophy of the supraspinatus and infraspinatus muscles. Mild atrophy of the subscapularis muscle. Biceps Long Head: Medial dislocation of the long head of biceps tendon. Tendinosis of the intra-articular portion of the long head of the biceps tendon with a partial tear. Severe synovitis in the long head of the biceps tendon sheath. Acromioclavicular Joint: Severe arthropathy of the acromioclavicular joint. Type I acromion. Small amount of subacromial/subdeltoid bursal fluid. Glenohumeral Joint: Large complex joint effusion with thick enhancing synovitis. Large amount  of joint fluid decompresses into the subcoracoid recess. high-grade partial-thickness cartilage loss of the glenohumeral joint. Labrum: Diffuse labral degeneration. Bones: No fracture or dislocation. Subchondral bone marrow edema in the inferior aspect of the glenoid likely reflecting reactive changes secondary to overlying cartilage loss and osteoarthritis, but early osteomyelitis. IMPRESSION: 1. Large complex joint effusion with thick enhancing synovitis decompresses into the subcoracoid recess. Overall appearance is most concerning for septic arthritis until proven otherwise. Recommend arthrocentesis. 2. High-grade partial-thickness cartilage loss of the glenohumeral joint. Subchondral bone marrow edema in the inferior aspect of the glenoid likely reflecting reactive changes secondary to overlying cartilage loss and osteoarthritis, but early osteomyelitis cannot be excluded. 3. Complete tear of the supraspinatus and infraspinatus tendons with 5.2 cm of retraction. 4. Complete tear of the subscapularis tendon. 5. Medial dislocation of the long head of the biceps tendon. Tendinosis of the intra-articular portion of the long head of the biceps tendon with a partial tear. Severe synovitis in the long head of the biceps tendon sheath which may be reactive versus extension of intra-articular infection. Electronically Signed   By: Kathreen Devoid   On: 03/04/2021 08:12   DG Shoulder Left Port  Result Date: 03/04/2021 CLINICAL DATA:  Shoulder replacement EXAM: LEFT SHOULDER COMPARISON:  CT and MRI 03/03/2021 FINDINGS: Interval left shoulder antibiotic spacer hemiarthroplasty with normal alignment. Mild fragmentation at [facility] joint. Drainage catheter overlies the left shoulder. No fracture is seen. IMPRESSION: Interval left shoulder antibiotic spacer hemiarthroplasty. No fracture is seen. Electronically Signed   By: Donavan Foil M.D.   On: 03/04/2021 19:07     Subjective: Pain is controlled, eager to go home after  ortho removed vac. No new issues.  Discharge Exam: Vitals:   03/05/21 2248 03/06/21 0505  BP: 105/69 (!) 145/86  Pulse: 67 (!) 56  Resp:  19  Temp: 98.8 F (37.1 C) 98.2 F (36.8 C)  SpO2: 92% 91%   General: Pt is alert, awake, not in acute distress Cardiovascular: RRR, S1/S2 +, no rubs, no gallops Respiratory: CTA bilaterally, no wheezing, no rhonchi Abdominal: Soft, NT, ND, bowel sounds + Extremities: No edema, no cyanosis. Left 2nd toe ulceration healing without erythema or discharge. DP pulse diminshed bilaterally, cap refill <2 sec, warm extremities. LUE with intact sensation and motor function distally.  Labs: BNP (last 3 results) No results for input(s): BNP in the last 8760 hours. Basic Metabolic Panel: Recent Labs  Lab 03/03/21 1900 03/04/21 0421 03/05/21 0347 03/06/21 0341  NA 138 137 140 136  K 3.4* 3.5 3.9 3.3*  CL 101 100 103 104  CO2 26 27 28 24   GLUCOSE 134* 108* 115* 133*  BUN 28* 28* 21 18  CREATININE  0.86 0.92 0.86 0.86  CALCIUM 8.7* 8.7* 8.1* 7.7*   Liver Function Tests: No results for input(s): AST, ALT, ALKPHOS, BILITOT, PROT, ALBUMIN in the last 168 hours. No results for input(s): LIPASE, AMYLASE in the last 168 hours. No results for input(s): AMMONIA in the last 168 hours. CBC: Recent Labs  Lab 03/03/21 1900 03/04/21 0421 03/05/21 0347 03/06/21 0341  WBC 12.5* 12.1* 12.7* 11.8*  NEUTROABS  --   --  9.4* 8.5*  HGB 15.1 13.8 12.3* 11.7*  HCT 46.4 42.1 38.8* 36.7*  MCV 97.9 97.5 101.0* 101.1*  PLT 348 340 304 282   Cardiac Enzymes: No results for input(s): CKTOTAL, CKMB, CKMBINDEX, TROPONINI in the last 168 hours. BNP: Invalid input(s): POCBNP CBG: Recent Labs  Lab 03/04/21 1249  GLUCAP 112*   D-Dimer No results for input(s): DDIMER in the last 72 hours. Hgb A1c No results for input(s): HGBA1C in the last 72 hours. Lipid Profile No results for input(s): CHOL, HDL, LDLCALC, TRIG, CHOLHDL, LDLDIRECT in the last 72  hours. Thyroid function studies No results for input(s): TSH, T4TOTAL, T3FREE, THYROIDAB in the last 72 hours.  Invalid input(s): FREET3 Anemia work up No results for input(s): VITAMINB12, FOLATE, FERRITIN, TIBC, IRON, RETICCTPCT in the last 72 hours. Urinalysis    Component Value Date/Time   COLORURINE YELLOW 03/25/2016 1022   APPEARANCEUR CLEAR 03/25/2016 1022   LABSPEC 1.011 03/25/2016 1022   PHURINE 6.5 03/25/2016 1022   GLUCOSEU NEGATIVE 03/25/2016 1022   HGBUR NEGATIVE 03/25/2016 1022   BILIRUBINUR NEGATIVE 03/25/2016 1022   KETONESUR NEGATIVE 03/25/2016 1022   PROTEINUR NEGATIVE 03/25/2016 1022   NITRITE NEGATIVE 03/25/2016 1022   LEUKOCYTESUR NEGATIVE 03/25/2016 1022    Microbiology Recent Results (from the past 240 hour(s))  Resp Panel by RT-PCR (Flu A&B, Covid) Nasopharyngeal Swab     Status: None   Collection Time: 03/03/21  7:00 PM   Specimen: Nasopharyngeal Swab; Nasopharyngeal(NP) swabs in vial transport medium  Result Value Ref Range Status   SARS Coronavirus 2 by RT PCR NEGATIVE NEGATIVE Final    Comment: (NOTE) SARS-CoV-2 target nucleic acids are NOT DETECTED.  The SARS-CoV-2 RNA is generally detectable in upper respiratory specimens during the acute phase of infection. The lowest concentration of SARS-CoV-2 viral copies this assay can detect is 138 copies/mL. A negative result does not preclude SARS-Cov-2 infection and should not be used as the sole basis for treatment or other patient management decisions. A negative result may occur with  improper specimen collection/handling, submission of specimen other than nasopharyngeal swab, presence of viral mutation(s) within the areas targeted by this assay, and inadequate number of viral copies(<138 copies/mL). A negative result must be combined with clinical observations, patient history, and epidemiological information. The expected result is Negative.  Fact Sheet for Patients:   EntrepreneurPulse.com.au  Fact Sheet for Healthcare Providers:  IncredibleEmployment.be  This test is no t yet approved or cleared by the [place] FDA and  has been authorized for detection and/or diagnosis of SARS-CoV-2 by FDA under an Emergency Use Authorization (EUA). This EUA will remain  in effect (meaning this test can be used) for the duration of the COVID-19 declaration under Section 564(b)(1) of the Act, 21 U.S.C.section 360bbb-3(b)(1), unless the authorization is terminated  or revoked sooner.       Influenza A by PCR NEGATIVE NEGATIVE Final   Influenza B by PCR NEGATIVE NEGATIVE Final    Comment: (NOTE) The Xpert Xpress SARS-CoV-2/FLU/RSV plus assay is intended as an aid in  the diagnosis of influenza from Nasopharyngeal swab specimens and should not be used as a sole basis for treatment. Nasal washings and aspirates are unacceptable for Xpert Xpress SARS-CoV-2/FLU/RSV testing.  Fact Sheet for Patients: EntrepreneurPulse.com.au  Fact Sheet for Healthcare Providers: IncredibleEmployment.be  This test is not yet approved or cleared by the [place] FDA and has been authorized for detection and/or diagnosis of SARS-CoV-2 by FDA under an Emergency Use Authorization (EUA). This EUA will remain in effect (meaning this test can be used) for the duration of the COVID-19 declaration under Section 564(b)(1) of the Act, 21 U.S.C. section 360bbb-3(b)(1), unless the authorization is terminated or revoked.  Performed at [facility], [place] [address], [place], [place] [address]   Surgical pcr screen     Status: None   Collection Time: 03/04/21  2:16 AM   Specimen: Nasal Mucosa; Nasal Swab  Result Value Ref Range Status   MRSA, PCR NEGATIVE NEGATIVE Final   Staphylococcus aureus NEGATIVE NEGATIVE Final    Comment: (NOTE) The Xpert SA Assay (FDA approved for NASAL specimens in  patients 68 years of age and older), is one component of a comprehensive surveillance program. It is not intended to diagnose infection nor to guide or monitor treatment. Performed at [facility], [place] [address], [place], [place] [address]   Aerobic/Anaerobic Culture w Gram Stain (surgical/deep wound)     Status: None (Preliminary result)   Collection Time: 03/04/21  3:10 PM   Specimen: Soft Tissue, Other  Result Value Ref Range Status   Specimen Description   Final    TISSUE  LEFT SHOULDER 1 Performed at [place] [address], [place], [place] [address]    Special Requests   Final    NONE Performed at [facility], [place] [address] [place], [place] [address]    Gram Stain   Final    MODERATE WBC PRESENT, PREDOMINANTLY PMN NO ORGANISMS SEEN Performed at [facility], [place] [address] [place], [place] [address]    Culture PENDING  Incomplete   Report Status PENDING  Incomplete  Acid Fast Smear (AFB)     Status: None   Collection Time: 03/04/21  3:10 PM   Specimen: Soft Tissue, Other  Result Value Ref Range Status   AFB Specimen Processing Comment  Final    Comment: Tissue Grinding and Digestion/Decontamination   Acid Fast Smear Negative  Final    Comment: (NOTE) Performed At: [facility] [place], [name] [id number] Rush Farmer MD [id number]    Source (AFB) TISSUE  Final    Comment:  LEFT SHOULDER 1 Performed at [facility], [place] [address] [place], [place] [address]   Aerobic/Anaerobic Culture w Gram Stain (surgical/deep wound)     Status: None (Preliminary result)   Collection Time: 03/04/21  3:18 PM   Specimen: Soft Tissue, Other  Result Value Ref Range Status   Specimen Description   Final    TISSUE  LEFT SHOULDER 2 Performed at [place] [address], [place], [place] [address]    Special Requests   Final     NONE Performed at [facility], [place] [address], [place], [place] [address]    Gram Stain   Final    FEW WBC PRESENT, PREDOMINANTLY PMN NO ORGANISMS SEEN Performed at [facility], [place] [address], [place], [place] [address]    Culture PENDING  Incomplete  Report Status PENDING  Incomplete  Acid Fast Smear (AFB)     Status: None   Collection Time: 03/04/21  3:18 PM   Specimen: Soft Tissue, Other  Result Value Ref Range Status   AFB Specimen Processing Comment  Final    Comment: Tissue Grinding and Digestion/Decontamination   Acid Fast Smear Negative  Final    Comment: (NOTE) Performed At: [facility] [place], [name] [id number] Rush Farmer MD [id number]    Source (AFB) TISSUE  Final    Comment:  LEFT SHOULDER 2 Performed at [place] [address], [place], [place] [address]   Aerobic/Anaerobic Culture w Gram Stain (surgical/deep wound)     Status: None (Preliminary result)   Collection Time: 03/04/21  3:20 PM   Specimen: Soft Tissue, Other  Result Value Ref Range Status   Specimen Description   Final    TISSUE  LEFT SHOULDER 3 Performed at [facility] [address], [place], [place] [address]    Special Requests   Final    NONE Performed at [facility], [place] [address] [place], [place] [address]    Gram Stain   Final    FEW WBC PRESENT, PREDOMINANTLY PMN NO ORGANISMS SEEN Performed at [facility], [place] [address] [place], [place] [address]    Culture PENDING  Incomplete   Report Status PENDING  Incomplete  Acid Fast Smear (AFB)     Status: None   Collection Time: 03/04/21  3:20 PM   Specimen: Soft Tissue, Other  Result Value Ref Range Status   AFB Specimen Processing Comment  Final    Comment: Tissue Grinding and Digestion/Decontamination   Acid Fast Smear Negative  Final    Comment: (NOTE) Performed At: [facility] [place], [name] [id number] Rush Farmer MD [id number]    Source (AFB) TISSUE  Final    Comment:  LEFT SHOULDER 3 Performed at [facility], [place] [address], [place], [place] [address]   Aerobic/Anaerobic Culture w Gram Stain (surgical/deep wound)     Status: None (Preliminary result)   Collection Time: 03/04/21  3:22 PM   Specimen: Soft Tissue, Other  Result Value Ref Range Status   Specimen Description   Final    TISSUE  LEFT SHOULDER 4 Performed at [facility], [place] [address], [place], [place] [address]    Special Requests   Final    NONE Performed at [facility], [place] [address], [place], [place] [address]    Gram Stain   Final    RARE WBC PRESENT, PREDOMINANTLY PMN NO ORGANISMS SEEN Performed at [facility], [place] [address], [place], [place] [address]    Culture PENDING  Incomplete   Report Status PENDING  Incomplete  Acid Fast Smear (AFB)     Status: None   Collection Time: 03/04/21  3:22 PM   Specimen: Soft Tissue, Other  Result Value Ref Range Status   AFB Specimen Processing Comment  Final    Comment: Tissue Grinding and Digestion/Decontamination   Acid Fast Smear Negative  Final    Comment: (NOTE) Performed At: [facility] [place], [name] [id number] Rush Farmer MD AS:[id number]    Source (AFB) TISSUE  Final    Comment:  LEFT SHOULDER 4 Performed at [facility], [place] [address] [place], [place] [address]     Time coordinating discharge: Approximately 40 minutes  Patrecia Pour, MD  [facility] 03/06/2021, 11:12 AM

## 2021-03-08 LAB — QUANTIFERON-TB GOLD PLUS (RQFGPL)
QuantiFERON Mitogen Value: 2.17 IU/mL
QuantiFERON Nil Value: 0 IU/mL
QuantiFERON TB1 Ag Value: 0.01 IU/mL
QuantiFERON TB2 Ag Value: 0 IU/mL

## 2021-03-08 LAB — QUANTIFERON-TB GOLD PLUS: QuantiFERON-TB Gold Plus: NEGATIVE

## 2021-03-11 LAB — AEROBIC/ANAEROBIC CULTURE W GRAM STAIN (SURGICAL/DEEP WOUND)

## 2021-03-12 ENCOUNTER — Ambulatory Visit: Payer: Medicare Other | Admitting: Infectious Disease

## 2021-03-12 ENCOUNTER — Other Ambulatory Visit: Payer: Self-pay

## 2021-03-12 VITALS — BP 90/54 | HR 83 | Temp 98.6°F | Wt 272.8 lb

## 2021-03-12 DIAGNOSIS — T8459XD Infection and inflammatory reaction due to other internal joint prosthesis, subsequent encounter: Secondary | ICD-10-CM

## 2021-03-12 DIAGNOSIS — L089 Local infection of the skin and subcutaneous tissue, unspecified: Secondary | ICD-10-CM

## 2021-03-12 DIAGNOSIS — A498 Other bacterial infections of unspecified site: Secondary | ICD-10-CM | POA: Diagnosis not present

## 2021-03-12 DIAGNOSIS — E11628 Type 2 diabetes mellitus with other skin complications: Secondary | ICD-10-CM

## 2021-03-12 DIAGNOSIS — M8660 Other chronic osteomyelitis, unspecified site: Secondary | ICD-10-CM | POA: Diagnosis not present

## 2021-03-12 DIAGNOSIS — Z96659 Presence of unspecified artificial knee joint: Secondary | ICD-10-CM | POA: Diagnosis not present

## 2021-03-12 DIAGNOSIS — Z880 Allergy status to penicillin: Secondary | ICD-10-CM

## 2021-03-12 DIAGNOSIS — M009 Pyogenic arthritis, unspecified: Secondary | ICD-10-CM

## 2021-03-12 MED ORDER — DOXYCYCLINE HYCLATE 100 MG PO TABS
100.0000 mg | ORAL_TABLET | Freq: Two times a day (BID) | ORAL | 5 refills | Status: DC
Start: 2021-03-12 — End: 2021-04-15

## 2021-03-12 NOTE — Progress Notes (Signed)
Subjective:  Chief complaint shoulder pain and persistent left foot lesion   Patient ID: Willie Pineda, male    DOB: 1946/05/18, 75 y.o.   MRN: [id number]  HPI  Willie Pineda a 52 -year-old Caucasian man who is a retired Marine scientist and also former Writer who has a history of atrial fibrillation hypertension hyperlipidemia nonischemic cardiomyopathy peripheral vascular disease COPD diabetes mellitus and prior right sided prosthetic knee infection (treated by my partners Dr. Baxter Flattery and Dr. Linus Salmons in 2017), who is being prepared for possible shoulder replacement surgery with Dr. Griffin Basil when a CT and MRI scan were done which showed a septic shoulder and abscess.  MRI specifically showed a large complex joint effusion with enhancing synovitis with high-grade partial-thickness cartilage loss of the glenohumeral joint subchondral bone marrow edema in the inferior aspect of the glenoid likely to osteomyelitis, tear of the supraspinatus infraspinatus tendons complete tear of the subscapularis tendon and dislocation long head of biceps tendon and severe synovitis of the long head of the biceps tendon which also thought to possibly represent intra-articular infection.  Of note he had injured this shoulder when he had fallen from his horse a year ago in December and struck the ground quite hard but without clear breaks in the skin but with extensive bruising.  He also told me that when he was younger he had a horse pull him through backwards and injured the same shoulder.  The patient had been seen by March 17 where he had developed a soft tissue infection which was debrided in the podiatry office.  He was given 10 days of doxycycline which he was on during admission to the hospital.  Note this was also a site of prior injury where horse and stepped on his toe.  Taken to the operating room by Dr. Griffin Basil where he underwent left shoulder I&D with debridement of abscess and also removal of bone from an area of  osteomyelitis involving the humeral head incision debridement of open septic shoulder synovectomy I&D of the [facility] joint with resection of part of the distal clavicle where there was osteomyelitis and placement of a left shoulder antibiotic spacer hemiarthroplasty.  Multiple cultures were done including for organisms that are more subacute in presentation such as nontuberculous back mycobacteria and fungi.  He was initially on vancomycin and aztreonam and metronidazole but later discharged on oral doxycycline and levofloxacin he has subsequently grown Propionibacterium acnes on multiple cultures.  We will narrow him to doxycycline today.  Shoulder pain is improved.  His foot is appearing stable but he is not yet seen podiatry.  Past Medical History:  Diagnosis Date  . Arthritis    "hands primarily" (05/05/2016)  . Atrial fibrillation ([place])   . Back pain    age related  . CHF (congestive heart failure) ([place])   . Complication of anesthesia    Bronchial spasms  . COPD (chronic obstructive pulmonary disease) ([facility])    inhalers as needed  . Depression    takes Zoloft daily  . Diabetic peripheral neuropathy ([place]) 01/03/2019  . Dyspnea    with exertion  . Dysrhythmia    A Fib-takes Xarelto daily  . Enlarged prostate    benign  . GERD (gastroesophageal reflux disease)    takes Omeprazole daily  . 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 Hyperlipidemia    takes Atorvastatin daily  . Hypertension    takes Metoprolol,HCTZ,and Losartan daily.Takes Clonidine  if needed  . Nocturia   . Peripheral neuropathy    takes Gabapentin daily  . Pneumonia ?2015  . Restless leg   . Sleep apnea    uses Bipap  . Stroke [facility]) 2012   denies residual on 05/05/2016  . Type II diabetes mellitus ([place])    borderline but doesn't take any meds (05/05/2016)    Past Surgical History:  Procedure Laterality Date  . CATARACT EXTRACTION W/ INTRAOCULAR LENS  IMPLANT, BILATERAL  Bilateral   . COLONOSCOPY WITH PROPOFOL N/A 03/09/2017   Procedure: COLONOSCOPY WITH PROPOFOL;  Surgeon: Wonda Horner, MD;  Location: [facility] ENDOSCOPY;  Service: Endoscopy;  Laterality: N/A;  . COLONOSCOPY WITH PROPOFOL N/A 04/03/2020   Procedure: COLONOSCOPY WITH PROPOFOL;  Surgeon: Wonda Horner, MD;  Location: [facility] ENDOSCOPY;  Service: Endoscopy;  Laterality: N/A;  . I & D KNEE WITH POLY EXCHANGE Right 05/04/2016   IRRIGATION AND DEBRIDEMENT Right KNEE WITH UNICOMPARTMENTAL POLY EXCHANGE  . I & D KNEE WITH POLY EXCHANGE Right 05/04/2016   Procedure: IRRIGATION AND DEBRIDEMENT Right KNEE WITH UNICOMPARTMENTAL POLY EXCHANGE;  Surgeon: Renette Butters, MD;  Location: [facility];  Service: Orthopedics;  Laterality: Right;  . JOINT REPLACEMENT    . LAPAROSCOPIC CHOLECYSTECTOMY  1995  . PARTIAL KNEE ARTHROPLASTY Right 04/06/2016   Procedure: UNICOMPARTMENTAL RIGHT KNEE;  Surgeon: Renette Butters, MD;  Location: [facility];  Service: Orthopedics;  Laterality: Right;  . POLYPECTOMY  04/03/2020   Procedure: POLYPECTOMY;  Surgeon: Wonda Horner, MD;  Location: [facility] ENDOSCOPY;  Service: Endoscopy;;  . STRABISMUS SURGERY Bilateral 06/10/2017   Procedure: REPAIR STRABISMUS BILATERAL;  Surgeon: Everitt Amber, MD;  Location: [facility];  Service: Ophthalmology;  Laterality: Bilateral;  . TONSILLECTOMY AND ADENOIDECTOMY    . TOTAL KNEE ARTHROPLASTY Left 2008  . TOTAL SHOULDER ARTHROPLASTY Left 03/04/2021   Procedure: Total Shoulder Arthroplasty;  Surgeon: [name], MD;  Location: [facility];  Service: Orthopedics;  Laterality: Left;  . ULNAR NERVE TRANSPOSITION Right 1989    Family History  Problem Relation Age of Onset  . CVA Mother   . Liver cancer Father       Social History   Socioeconomic History  . Marital status: Married    Spouse name: Not on file  . Number of children: Not on file  . Years of education: Not on file  . Highest education level: Not on file  Occupational History  .  Occupation: Retired Therapist, sports  Tobacco Use  . Smoking status: Former Smoker    Packs/day: 0.50    Years: 40.00    Pack years: 20.00    Types: Cigarettes    Quit date: 07/15/2011    Years since quitting: 9.6  . Smokeless tobacco: Former Systems developer    Types: Secondary school teacher  . Vaping Use: Never used  Substance and Sexual Activity  . Alcohol use: Yes    Comment: maybe 2 glases wine every other week  . Drug use: No  . Sexual activity: Yes  Other Topics Concern  . Not on file  Social History Narrative   Lives at home with spouse    Caffeine 2 cups of coffee daily    Right handed.    Social Determinants of Health   Financial Resource Strain: Not on file  Food Insecurity: Not on file  Transportation Needs: Not on file  Physical Activity: Not on file  Stress: Not on file  Social Connections: Not on file  Allergies  Allergen Reactions  . Penicillins Anaphylaxis  . Alfalfa   . Dymista [Azelastine-Fluticasone] Other (See Comments)    Burning sensation   . Flonase [Fluticasone Propionate] Other (See Comments)    burning  . Hydrocodone Other (See Comments)    SWEATING  . Spironolactone Other (See Comments)    Makes chest swelling - or as pt puts it "makes his breasts swell and sore"  . Warfarin And Related Other (See Comments)    Pt states it makes his INR critical levels     Current Outpatient Medications:  .  acetaminophen (TYLENOL) 500 MG tablet, Take 2 tablets (1,000 mg total) by mouth every 8 (eight) hours for 14 days., Disp: 84 tablet, Rfl: 0 .  atorvastatin (LIPITOR) 10 MG tablet, Take 10 mg by mouth daily., Disp: , Rfl: 6 .  cloNIDine (CATAPRES) 0.1 MG tablet, Take 0.1 mg by mouth daily as needed (Only takes if pt if bp  > 160)., Disp: , Rfl:  .  cyclobenzaprine (FLEXERIL) 10 MG tablet, Take 10 mg by mouth at bedtime as needed for muscle spasms. , Disp: , Rfl:  .  doxycycline (VIBRA-TABS) 100 MG tablet, Take 1 tablet (100 mg total) by mouth 2 (two) times daily. End date  04/15/2021, Disp: 82 tablet, Rfl: 0 .  ENTRESTO 24-26 MG, Take 1 tablet by mouth 2 (two) times daily., Disp: , Rfl:  .  gabapentin (NEURONTIN) 300 MG capsule, Take 900 mg by mouth 2 (two) times daily. , Disp: , Rfl:  .  levofloxacin (LEVAQUIN) 750 MG tablet, Take 1 tablet (750 mg total) by mouth daily. End date 04/15/2021, Disp: 41 tablet, Rfl: 0 .  metolazone (ZAROXOLYN) 2.5 MG tablet, Take 2.5 mg by mouth daily., Disp: , Rfl:  .  metoprolol succinate (TOPROL-XL) 50 MG 24 hr tablet, Take 50 mg by mouth daily. Take with or immediately following a meal., Disp: , Rfl:  .  omeprazole (PRILOSEC) 20 MG capsule, Take 20 mg by mouth daily., Disp: , Rfl:  .  oxyCODONE-acetaminophen (ROXICET) 5-325 MG tablet, Take 2 tablets by mouth every 4 (four) hours as needed. (Patient taking differently: Take 2 tablets by mouth every 4 (four) hours as needed for severe pain.), Disp: 60 tablet, Rfl: 0 .  potassium chloride (MICRO-K) 10 MEQ CR capsule, Take 10 mEq by mouth daily., Disp: , Rfl:  .  rivaroxaban (XARELTO) 20 MG TABS tablet, Take 20 mg by mouth daily., Disp: , Rfl:  .  sertraline (ZOLOFT) 100 MG tablet, Take 100 mg by mouth daily., Disp: , Rfl:  .  testosterone cypionate (DEPOTESTOSTERONE CYPIONATE) 200 MG/ML injection, Inject 100 mg into the muscle every 14 (fourteen) days. , Disp: , Rfl:  .  torsemide (DEMADEX) 100 MG tablet, Take 100 mg by mouth daily., Disp: , Rfl:     Review of Systems  Constitutional: Negative for activity change, appetite change, chills, diaphoresis, fatigue, fever and unexpected weight change.  HENT: Negative for congestion, rhinorrhea, sinus pressure, sneezing, sore throat and trouble swallowing.   Eyes: Negative for photophobia and visual disturbance.  Respiratory: Negative for cough, chest tightness, shortness of breath, wheezing and stridor.   Cardiovascular: Negative for chest pain, palpitations and leg swelling.  Gastrointestinal: Negative for abdominal distention, abdominal  pain, anal bleeding, blood in stool, constipation, diarrhea, nausea and vomiting.  Genitourinary: Negative for difficulty urinating, dysuria, flank pain and hematuria.  Musculoskeletal: Positive for arthralgias. Negative for back pain, gait problem, joint swelling and myalgias.  Skin: Negative for color  change, pallor, rash and wound.  Neurological: Negative for dizziness, tremors, weakness and light-headedness.  Hematological: Negative for adenopathy. Does not bruise/bleed easily.  Psychiatric/Behavioral: Negative for agitation, behavioral problems, confusion, decreased concentration, dysphoric mood and sleep disturbance.       Objective:   Physical Exam Constitutional:      Appearance: He is well-developed.  HENT:     Head: Normocephalic and atraumatic.  Eyes:     Extraocular Movements: Extraocular movements intact.     Conjunctiva/sclera: Conjunctivae normal.  Cardiovascular:     Rate and Rhythm: Normal rate and regular rhythm.  Pulmonary:     Effort: Pulmonary effort is normal. No respiratory distress.     Breath sounds: No wheezing.  Abdominal:     General: There is no distension.     Palpations: Abdomen is soft.  Musculoskeletal:        General: No tenderness. Normal range of motion.     Cervical back: Normal range of motion and neck supple.  Skin:    General: Skin is warm and dry.     Coloration: Skin is not pale.     Findings: No erythema or rash.  Neurological:     General: No focal deficit present.     Mental Status: He is alert and oriented to person, place, and time.  Psychiatric:        Mood and Affect: Mood normal.        Behavior: Behavior normal.        Thought Content: Thought content normal.        Judgment: Judgment normal.     Left shoulder in sling.  Left foot pictured below March 12, 2021:           Assessment & Plan:  Left septic shoulder with also osteomyelitis involving the clavicle and humerus: Propionibacterium is isolated we will  continue doxycycline I would like to extend this for several months given that there is osteomyelitis involved and given that there is consideration of shoulder replacement being performed.  We will see him in about a month and repeat inflammatory markers.  Diabetic foot infection: Would like to get plain films but forgot to order the x-ray today we will order it now and he can have it done whenever he would like to go to [facility] imaging.  Will consider MRI if this persists as well and plain films are unrevealing he has follow-up with podiatry.   Former prosthetic joint infection on the right: Status post treatment there is no specific organism 10 5.  History of penicillin allergy he is started tolerated cephalexin to cephalosporins would certainly be an option he was not tested in the hospital with skin testing or amoxicillin challenge.  I spent greater than 40 minutes with the patient including greater than 50% of time in face to face counsel of the patient and his wife regarding the nature of native joint infections osteomyelitis regarding nature Propionibacterium, diabetic foot infections and in coordination of his care.

## 2021-03-13 ENCOUNTER — Encounter: Payer: Self-pay | Admitting: Infectious Disease

## 2021-03-13 ENCOUNTER — Other Ambulatory Visit: Payer: Self-pay | Admitting: Infectious Disease

## 2021-03-13 DIAGNOSIS — L089 Local infection of the skin and subcutaneous tissue, unspecified: Secondary | ICD-10-CM

## 2021-03-13 DIAGNOSIS — E11628 Type 2 diabetes mellitus with other skin complications: Secondary | ICD-10-CM

## 2021-03-16 ENCOUNTER — Ambulatory Visit
Admission: RE | Admit: 2021-03-16 | Discharge: 2021-03-16 | Disposition: A | Discharge status: Home / Self Care | Payer: Medicare Other | Source: Ambulatory Visit | Attending: Infectious Disease | Admitting: Infectious Disease

## 2021-03-16 DIAGNOSIS — M12872 Other specific arthropathies, not elsewhere classified, left ankle and foot: Secondary | ICD-10-CM | POA: Diagnosis not present

## 2021-03-16 DIAGNOSIS — E119 Type 2 diabetes mellitus without complications: Secondary | ICD-10-CM | POA: Diagnosis not present

## 2021-03-16 DIAGNOSIS — M7989 Other specified soft tissue disorders: Secondary | ICD-10-CM | POA: Diagnosis not present

## 2021-03-16 DIAGNOSIS — E11628 Type 2 diabetes mellitus with other skin complications: Secondary | ICD-10-CM

## 2021-03-16 DIAGNOSIS — E876 Hypokalemia: Secondary | ICD-10-CM | POA: Diagnosis not present

## 2021-03-16 DIAGNOSIS — M19072 Primary osteoarthritis, left ankle and foot: Secondary | ICD-10-CM | POA: Diagnosis not present

## 2021-03-16 DIAGNOSIS — L02419 Cutaneous abscess of limb, unspecified: Secondary | ICD-10-CM | POA: Diagnosis not present

## 2021-03-17 DIAGNOSIS — M19012 Primary osteoarthritis, left shoulder: Secondary | ICD-10-CM | POA: Diagnosis not present

## 2021-03-18 ENCOUNTER — Ambulatory Visit (INDEPENDENT_AMBULATORY_CARE_PROVIDER_SITE_OTHER): Payer: Medicare Other | Admitting: Rehabilitative and Restorative Service Providers"

## 2021-03-18 ENCOUNTER — Encounter: Payer: Self-pay | Admitting: Rehabilitative and Restorative Service Providers"

## 2021-03-18 ENCOUNTER — Other Ambulatory Visit: Payer: Self-pay

## 2021-03-18 ENCOUNTER — Telehealth: Payer: Self-pay

## 2021-03-18 DIAGNOSIS — M6281 Muscle weakness (generalized): Secondary | ICD-10-CM | POA: Diagnosis not present

## 2021-03-18 DIAGNOSIS — R293 Abnormal posture: Secondary | ICD-10-CM

## 2021-03-18 DIAGNOSIS — M25512 Pain in left shoulder: Secondary | ICD-10-CM

## 2021-03-18 NOTE — Patient Instructions (Signed)
Access Code: [email or web address] URL: [email or web address] Date: 03/18/2021 Prepared by: [place]  Exercises Circular Shoulder Pendulum with Table Support - 3 x daily - 7 x weekly - 10 reps Flexion-Extension Shoulder Pendulum with Table Support - 3 x daily - 7 x weekly - 10 reps Seated Shoulder Flexion Towel Slide at Table Top Full Range of Motion - 2 x daily - 7 x weekly - 5 reps - 5-10 seconds hold Supported Elbow Flexion Extension AROM - 3 x daily - 7 x weekly - 10 reps Wrist Extension AROM - 3 x daily - 7 x weekly - 10 reps Seated Forearm Pronation and Supination AROM - 3 x daily - 7 x weekly - 10 reps

## 2021-03-18 NOTE — Telephone Encounter (Signed)
I spoke to the patient and relayed x-ray results. Patient verbalized understanding. Patient stated he has an appointment to see a podiatrist (Dr. Sharla Kidney with [facility]) on 03/23/21 and he is already seeing an orthopedic with [facility].  Gurpreet Mariani T Brooks Sailors

## 2021-03-18 NOTE — Therapy (Signed)
[place], [name], [address] Phone: [phone]   Fax:  [phone]  Physical Therapy Evaluation  Patient Details  Name: Willie Pineda MRN: [id number] Date of Birth: 05-13-1946 Referring Provider (PT): Dr [name]   Encounter Date: 03/18/2021   PT End of Session - 03/18/21 1242    Visit Number 1    Number of Visits 12    Date for PT Re-Evaluation 04/29/21    PT Start Time 0937    PT Stop Time 1017    PT Time Calculation (min) 40 min    Activity Tolerance Patient tolerated treatment well           Past Medical History:  Diagnosis Date  . Arthritis    "hands primarily" (05/05/2016)  . Atrial fibrillation ([place])   . Back pain    age related  . CHF (congestive heart failure) ([place])   . Complication of anesthesia    Bronchial spasms  . COPD (chronic obstructive pulmonary disease) (H[facility])    inhalers as needed  . Depression    takes Zoloft daily  . Diabetic peripheral neuropathy ([place]) 01/03/2019  . Dyspnea    with exertion  . Dysrhythmia    A Fib-takes Xarelto daily  . Enlarged prostate    benign  . GERD (gastroesophageal reflux disease)    takes Omeprazole daily  . 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 Hyperlipidemia    takes Atorvastatin daily  . Hypertension    takes Metoprolol,HCTZ,and Losartan daily.Takes Clonidine if needed  . Nocturia   . Peripheral neuropathy    takes Gabapentin daily  . Pneumonia ?2015  . Restless leg   . Sleep apnea    uses Bipap  . Stroke [facility]) 2012   denies residual on 05/05/2016  . Type II diabetes mellitus ([place])    borderline but doesn't take any meds (05/05/2016)    Past Surgical History:  Procedure Laterality Date  . CATARACT EXTRACTION W/ INTRAOCULAR LENS  IMPLANT, BILATERAL Bilateral   . COLONOSCOPY WITH PROPOFOL N/A 03/09/2017   Procedure: COLONOSCOPY WITH PROPOFOL;  Surgeon: Wonda Horner, MD;  Location:  [facility] ENDOSCOPY;  Service: Endoscopy;  Laterality: N/A;  . COLONOSCOPY WITH PROPOFOL N/A 04/03/2020   Procedure: COLONOSCOPY WITH PROPOFOL;  Surgeon: Wonda Horner, MD;  Location: [facility] ENDOSCOPY;  Service: Endoscopy;  Laterality: N/A;  . I & D KNEE WITH POLY EXCHANGE Right 05/04/2016   IRRIGATION AND DEBRIDEMENT Right KNEE WITH UNICOMPARTMENTAL POLY EXCHANGE  . I & D KNEE WITH POLY EXCHANGE Right 05/04/2016   Procedure: IRRIGATION AND DEBRIDEMENT Right KNEE WITH UNICOMPARTMENTAL POLY EXCHANGE;  Surgeon: Renette Butters, MD;  Location: [place];  Service: Orthopedics;  Laterality: Right;  . JOINT REPLACEMENT    . LAPAROSCOPIC CHOLECYSTECTOMY  1995  . PARTIAL KNEE ARTHROPLASTY Right 04/06/2016   Procedure: UNICOMPARTMENTAL RIGHT KNEE;  Surgeon: Renette Butters, MD;  Location: [place];  Service: Orthopedics;  Laterality: Right;  . POLYPECTOMY  04/03/2020   Procedure: POLYPECTOMY;  Surgeon: Wonda Horner, MD;  Location: [facility] ENDOSCOPY;  Service: Endoscopy;;  . STRABISMUS SURGERY Bilateral 06/10/2017   Procedure: REPAIR STRABISMUS BILATERAL;  Surgeon: Everitt Amber, MD;  Location: [place];  Service: Ophthalmology;  Laterality: Bilateral;  . TONSILLECTOMY AND ADENOIDECTOMY    . TOTAL KNEE ARTHROPLASTY Left 2008  . TOTAL SHOULDER ARTHROPLASTY Left 03/04/2021   Procedure: Total Shoulder Arthroplasty;  Surgeon: [name], MD;  Location: [facility];  Service: Orthopedics;  Laterality: Left;  . ULNAR NERVE TRANSPOSITION Right 1989    There were no vitals filed for this visit.    Subjective Assessment - 03/18/21 0940    Subjective Patient reports Lt shoulder injury ~ 20 yrs ago then began to have pain 12/21 when he fell striking shoulder on door. Pain persisted and hewas scheduled for Lt reverse shoulder replacement but CT showed abcess in Lt shoulder joint with complete tear of rotator cuff tendons. He underwent debridement of shoulder with placement of anti-infective spacer 03/04/21. He will be  scheduled for reverse total shoulder replacement in 3-4 months.    Pertinent History CVA 2012; Bilat TKA 2000 Lt 2017 Rt; gall bladder surgery 1995; AODM; HTN; CHF; a-fib    Patient Stated Goals ROM and strengthening before surgery    Currently in Pain? Yes    Pain Score 1     Pain Location Shoulder    Pain Orientation Left    Pain Descriptors / Indicators Dull    Pain Type Chronic pain;Surgical pain    Pain Onset More than a month ago    Pain Frequency Intermittent    Aggravating Factors  movement    Pain Relieving Factors supported with sling              [facility] PT Assessment - 03/18/21 0001      Assessment   Medical Diagnosis Lt shoulder debridement    Referring Provider (PT) Dr [name]    Onset Date/Surgical Date 03/04/21   injury 12/01/20 with prior injury ~ 20 yrs ago   Hand Dominance Right    Prior Therapy here for knees      Precautions   Precautions Shoulder      Restrictions   Weight Bearing Restrictions No      Balance Screen   Has the patient fallen in the past 6 months Yes    How many times? 1    Has the patient had a decrease in activity level because of a fear of falling?  No    Is the patient reluctant to leave their home because of a fear of falling?  No      Prior Function   Level of Independence Independent    Vocation Retired    Chief Technology Officer; Education administrator    Leisure yard work; household projects      Observation/Other Assessments   Focus on Therapeutic Outcomes (FOTO)  21      Observation/Other Assessments-Edema    Edema --   edema noted Lt shoulder into Lt hand/fingers     Sensation   Additional Comments Lt hand      Posture/Postural Control   Posture Comments head forward; shouders rounded and elevated; head of the humerus anterior in orientation      AROM   Right/Left Shoulder --   not assessed Lt shoulder   Right Shoulder Extension 53 Degrees    Right Shoulder Flexion 144 Degrees    Right Shoulder ABduction 148 Degrees     Right Shoulder Internal Rotation --   thumb T10/11   Right Shoulder External Rotation 72 Degrees      Strength   Overall Strength Comments Rt UE WFL's - not tested on the Lt                      Objective measurements completed on examination: See above findings.  [name] Adult PT Treatment/Exercise - 03/18/21 0001      Shoulder Exercises: Seated   Retraction AROM;Both;5 reps   5-10 sec hold w/chest lift working on pulling shoulder blades down and back     Shoulder Exercises: ROM/Strengthening   Pendulum 30 CW/30 CCW small circles      Shoulder Exercises: Stretch   Elbow Flexion AROM;Left;Seated   working on AROM/gentle stretching elbow; supination/pronation; and wrist/hand ROM   Table Stretch - Flexion 5 reps;10 seconds                  PT Education - 03/18/21 0946    Education Details POC, HEP    Person(s) Educated Patient    Methods Explanation;Demonstration;Verbal cues;Handout    Comprehension Returned demonstration;Verbalized understanding               PT Long Term Goals - 03/18/21 1257      PT LONG TERM GOAL #1   Title Improve posture and alignment with patient to demonstrate improved upright posture with posterior shoulder girdle engaged    Time 6    Period Weeks    Status New    Target Date 04/29/21      PT LONG TERM GOAL #2   Title PROM to [facility] Lt shoulder as tissue condition indicates and MD approves    Time 6    Period Weeks    Status New    Target Date 04/29/21      PT LONG TERM GOAL #3   Title AROM to strengthening Lt UE as indicated and MD approves working to improve strength in Lt shoulder girdle to compensate for absent rotator cuff strength    Time 6    Period Weeks    Status New    Target Date 04/29/21      PT LONG TERM GOAL #4   Title Independent in HEP    Time 6    Period Weeks    Status New    Target Date 04/29/21      PT LONG TERM GOAL #5   Title Improve functional limitation score to 63    Time 6     Period Weeks    Status New    Target Date 04/29/21                  Plan - 03/18/21 1243    Clinical Impression Statement Patient presents with history of injury to Lt shoulder 11/30/21 when he fell striking Lt shoulder on doorframe. Pain persisted and he was scheduled for reverse total shoulder proceedure but the Lt shoulder joint was infected preventing joint replacement. Lt shoulder was debrided with spacer placed in the joint as well as medication to address infection. Patient will continue with oral medications and be scheduled for reverse total shoulder in 3-4 months if all goes well. He presents to clinic with Lt UE supported in sling. He has poor posture and alignment; limited mobility/ROM/strength/function per limited assessment possible with diagnosis. Patient will benefit from PT to address problems identified.    Clinical Decision Making Low    Rehab Potential Good    PT Frequency 2x / week    PT Duration 6 weeks    PT Treatment/Interventions ADLs/Self Care Home Management;Aquatic Therapy;Cryotherapy;Electrical Stimulation;Iontophoresis 4mg /ml Dexamethasone;Moist Heat;Ultrasound;Therapeutic activities;Therapeutic exercise;Neuromuscular re-education;Patient/family education;Manual techniques;Passive range of motion;Dry needling;Taping    PT Next Visit Plan review HEP; progress with PROM to [facility]; work on scapular stability; manual work and modalities as indicated    PT  Home Exercise Plan [email or web address]    Consulted and Agree with Plan of Care Patient           Patient will benefit from skilled therapeutic intervention in order to improve the following deficits and impairments:     Visit Diagnosis: Acute pain of left shoulder - Plan: PT plan of care cert/re-cert  Muscle weakness (generalized) - Plan: PT plan of care cert/re-cert  Abnormal posture - Plan: PT plan of care cert/re-cert     Problem List Patient Active Problem List   Diagnosis Date Noted  . Penicillin  allergy   . Chronic osteomyelitis ([place])   . Diabetic foot infection ([place])   . S/p total knee replacement, bilateral   . Septic arthritis of shoulder, left ([place]) 03/03/2021  . Atrial fibrillation, chronic ([place]) 03/03/2021  . Chronic anticoagulation 03/03/2021  . DM (diabetes mellitus), type 2 with neurological complications ([place]) 38/37/7939  . Essential hypertension 03/03/2021  . Hypokalemia 03/03/2021  . Prolonged QT interval 03/03/2021  . COPD (chronic obstructive pulmonary disease) ([place]) 03/03/2021  . Chronic CHF (congestive heart failure) ([place]) 03/03/2021  . Diabetic peripheral neuropathy ([place]) 01/03/2019  . Obesity 06/01/2016  . Wound dehiscence 05/04/2016  . Knee osteoarthritis 04/06/2016    Ovella Manygoats Nilda Simmer PT, MPH  03/18/2021, 1:14 PM  [facility] [place], [name], [address] Phone: [phone]   Fax:  [phone]  Name: Willie Pineda MRN: [id number] Date of Birth: [birth year]

## 2021-03-18 NOTE — Telephone Encounter (Signed)
-----   Message from Truman Hayward, MD sent at 03/18/2021 11:20 AM EDT ----- Willie Pineda is showing findings concerning for osteomyelitis. DOes he have an orthopedic surgeon or podiatrist? I may also want to get an MRI but this area will need to be attended to

## 2021-03-19 NOTE — Telephone Encounter (Signed)
Patient advised that he will need to see an orthopedic foot surgeon. Patient state he is seeing [facility] for his shoulder. Please advise

## 2021-03-23 DIAGNOSIS — M2041 Other hammer toe(s) (acquired), right foot: Secondary | ICD-10-CM | POA: Diagnosis not present

## 2021-03-23 DIAGNOSIS — M2042 Other hammer toe(s) (acquired), left foot: Secondary | ICD-10-CM | POA: Diagnosis not present

## 2021-03-23 DIAGNOSIS — I10 Essential (primary) hypertension: Secondary | ICD-10-CM | POA: Diagnosis not present

## 2021-03-23 DIAGNOSIS — E1142 Type 2 diabetes mellitus with diabetic polyneuropathy: Secondary | ICD-10-CM | POA: Diagnosis not present

## 2021-03-23 DIAGNOSIS — E11621 Type 2 diabetes mellitus with foot ulcer: Secondary | ICD-10-CM | POA: Diagnosis not present

## 2021-03-23 DIAGNOSIS — L97522 Non-pressure chronic ulcer of other part of left foot with fat layer exposed: Secondary | ICD-10-CM | POA: Diagnosis not present

## 2021-03-23 NOTE — Telephone Encounter (Signed)
Patient states he has spoke with Raliegh Ip and they are suppose to get back to him to let him know if they have a orthopedic foot surgeon. Patient states he will see podiatry today and discuss the Xray results with them as will.  Patient will call back to let our office know what he decides as far as the referral for an orthopedic surgeon. Jennie Hannay T Brooks Sailors

## 2021-03-25 DIAGNOSIS — I2510 Atherosclerotic heart disease of native coronary artery without angina pectoris: Secondary | ICD-10-CM | POA: Diagnosis not present

## 2021-03-25 DIAGNOSIS — I10 Essential (primary) hypertension: Secondary | ICD-10-CM | POA: Diagnosis not present

## 2021-03-25 DIAGNOSIS — G4733 Obstructive sleep apnea (adult) (pediatric): Secondary | ICD-10-CM | POA: Diagnosis not present

## 2021-03-25 DIAGNOSIS — Z9989 Dependence on other enabling machines and devices: Secondary | ICD-10-CM | POA: Diagnosis not present

## 2021-03-25 DIAGNOSIS — I5022 Chronic systolic (congestive) heart failure: Secondary | ICD-10-CM | POA: Diagnosis not present

## 2021-03-25 DIAGNOSIS — I4510 Unspecified right bundle-branch block: Secondary | ICD-10-CM | POA: Diagnosis not present

## 2021-03-25 DIAGNOSIS — I428 Other cardiomyopathies: Secondary | ICD-10-CM | POA: Diagnosis not present

## 2021-03-25 DIAGNOSIS — I482 Chronic atrial fibrillation, unspecified: Secondary | ICD-10-CM | POA: Diagnosis not present

## 2021-03-25 DIAGNOSIS — Z8673 Personal history of transient ischemic attack (TIA), and cerebral infarction without residual deficits: Secondary | ICD-10-CM | POA: Diagnosis not present

## 2021-03-26 ENCOUNTER — Encounter: Payer: Self-pay | Admitting: Rehabilitative and Restorative Service Providers"

## 2021-03-26 ENCOUNTER — Other Ambulatory Visit: Payer: Self-pay

## 2021-03-26 ENCOUNTER — Ambulatory Visit (INDEPENDENT_AMBULATORY_CARE_PROVIDER_SITE_OTHER): Payer: Medicare Other | Admitting: Rehabilitative and Restorative Service Providers"

## 2021-03-26 DIAGNOSIS — R293 Abnormal posture: Secondary | ICD-10-CM

## 2021-03-26 DIAGNOSIS — M6281 Muscle weakness (generalized): Secondary | ICD-10-CM

## 2021-03-26 DIAGNOSIS — M25512 Pain in left shoulder: Secondary | ICD-10-CM

## 2021-03-26 DIAGNOSIS — R1310 Dysphagia, unspecified: Secondary | ICD-10-CM

## 2021-03-26 LAB — AEROBIC/ANAEROBIC CULTURE W GRAM STAIN (SURGICAL/DEEP WOUND)
Culture: NO GROWTH
Culture: NO GROWTH
Culture: NO GROWTH

## 2021-03-26 NOTE — Patient Instructions (Signed)
Access Code: [email or web address]: [email or web address]: 04/21/2022Prepared by: Tirth Cothron HoltExercises  Circular Shoulder Pendulum with Table Support - 3 x daily - 7 x weekly - 10 reps  Flexion-Extension Shoulder Pendulum with Table Support - 3 x daily - 7 x weekly - 10 reps  Seated Shoulder Flexion Towel Slide at Table Top Full Range of Motion - 2 x daily - 7 x weekly - 5 reps - 5-10 seconds hold  Supported Elbow Flexion Extension AROM - 3 x daily - 7 x weekly - 10 reps  Wrist Extension AROM - 3 x daily - 7 x weekly - 10 reps  Seated Forearm Pronation and Supination AROM - 3 x daily - 7 x weekly - 10 reps  Standing Shoulder and Trunk Flexion at Table - 2 x daily - 7 x weekly - 1 sets - 5 reps - 10 sec hold  Forearm Supination Stretch - 2 x daily - 7 x weekly - 1 sets - 3-5 reps - 10 sec hold  Seated Wrist Extension PROM - 2 x daily - 7 x weekly - 1 sets - 3-5 reps - 5-10 sec hold  Seated Wrist Flexion Stretch - 2 x daily - 7 x weekly - 1 sets - 3-5 reps - 5-10 sec hold

## 2021-03-26 NOTE — Therapy (Addendum)
[place], [name], [address] Phone: [phone]   Fax:  [phone]  Physical Therapy Treatment  Patient Details  Name: Willie Pineda MRN: [id number] Date of Birth: May 16, 1946 Referring Provider (PT): Dr [name]   Encounter Date: 03/26/2021   PT End of Session - 03/26/21 1405    Visit Number 2    Number of Visits 12    Date for PT Re-Evaluation 04/29/21    PT Start Time 1402    PT Stop Time 1445    PT Time Calculation (min) 43 min    Activity Tolerance Patient tolerated treatment well           Past Medical History:  Diagnosis Date  . Arthritis    "hands primarily" (05/05/2016)  . Atrial fibrillation ([place])   . Back pain    age related  . CHF (congestive heart failure) ([place])   . Complication of anesthesia    Bronchial spasms  . COPD (chronic obstructive pulmonary disease) ([facility])    inhalers as needed  . Depression    takes Zoloft daily  . Diabetic peripheral neuropathy ([place]) 01/03/2019  . Dyspnea    with exertion  . Dysrhythmia    A Fib-takes Xarelto daily  . Enlarged prostate    benign  . GERD (gastroesophageal reflux disease)    takes Omeprazole daily  . 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 Hyperlipidemia    takes Atorvastatin daily  . Hypertension    takes Metoprolol,HCTZ,and Losartan daily.Takes Clonidine if needed  . Nocturia   . Peripheral neuropathy    takes Gabapentin daily  . Pneumonia ?2015  . Restless leg   . Sleep apnea    uses Bipap  . Stroke [facility]) 2012   denies residual on 05/05/2016  . Type II diabetes mellitus ([place])    borderline but doesn't take any meds (05/05/2016)    Past Surgical History:  Procedure Laterality Date  . CATARACT EXTRACTION W/ INTRAOCULAR LENS  IMPLANT, BILATERAL Bilateral   . COLONOSCOPY WITH PROPOFOL N/A 03/09/2017   Procedure: COLONOSCOPY WITH PROPOFOL;  Surgeon: Wonda Horner, MD;  Location:  [facility] ENDOSCOPY;  Service: Endoscopy;  Laterality: N/A;  . COLONOSCOPY WITH PROPOFOL N/A 04/03/2020   Procedure: COLONOSCOPY WITH PROPOFOL;  Surgeon: Wonda Horner, MD;  [facility] ENDOSCOPY;  Service: Endoscopy;  Laterality: N/A;  . I & D KNEE WITH POLY EXCHANGE Right 05/04/2016   IRRIGATION AND DEBRIDEMENT Right KNEE WITH UNICOMPARTMENTAL POLY EXCHANGE  . I & D KNEE WITH POLY EXCHANGE Right 05/04/2016   Procedure: IRRIGATION AND DEBRIDEMENT Right KNEE WITH UNICOMPARTMENTAL POLY EXCHANGE;  Surgeon: Renette Butters, MD;  Location: [place];  Service: Orthopedics;  Laterality: Right;  . JOINT REPLACEMENT    . LAPAROSCOPIC CHOLECYSTECTOMY  1995  . PARTIAL KNEE ARTHROPLASTY Right 04/06/2016   Procedure: UNICOMPARTMENTAL RIGHT KNEE;  Surgeon: Renette Butters, MD;  Location: [place];  Service: Orthopedics;  Laterality: Right;  . POLYPECTOMY  04/03/2020   Procedure: POLYPECTOMY;  Surgeon: Wonda Horner, MD;  [facility];  Service: Endoscopy;;  . STRABISMUS SURGERY Bilateral 06/10/2017   Procedure: REPAIR STRABISMUS BILATERAL;  Surgeon: Everitt Amber, MD;  Location: [place];  Service: Ophthalmology;  Laterality: Bilateral;  . TONSILLECTOMY AND ADENOIDECTOMY    . TOTAL KNEE ARTHROPLASTY Left 2008  . TOTAL SHOULDER ARTHROPLASTY Left 03/04/2021   Procedure: Total Shoulder Arthroplasty;  Surgeon: [name], MD;  [facility];  Service: Orthopedics;  Laterality: Left;  . ULNAR NERVE TRANSPOSITION Right 1989    There were no vitals filed for this visit.   Subjective Assessment - 03/26/21 1405    Subjective Patient reports that he has done some of his exercises. The table slide hurts so he has not done that one. He is in and out of the sling but has the UE supported when out of the sling. Sleeping in the bed with the UE supported with a pillow and sleeping pretty good.    Currently in Pain? No/denies    Pain Score 0-No pain                              [facility] Adult PT Treatment/Exercise - 03/26/21 0001      Shoulder Exercises: Seated   Retraction AROM;Both;5 reps   5-10 sec hold w/chest lift working on pulling shoulder blades down and back   Other Seated Exercises AAROM Lt supination; wrist extension; wrist flexion 10 sec hold x 5 reps each    Other Seated Exercises elbow extension 10 sec x 1 rep      Shoulder Exercises: Standing   Other Standing Exercises scap squeeze 10 sec x 10 reps      Shoulder Exercises: ROM/Strengthening   Pendulum 30 CW/30 CCW small circles   bending fwd so shoulder hangs at ~ 80 deg   Other ROM/Strengthening Exercises counter step backfor shoulder flexion 10 sec x 5 reps      Manual Therapy   Manual therapy comments pt sitting Lt UE supported on pillow    Edema Management retrograde massage fingers; dorsum of hand; forearm    Soft tissue mobilization working through forearm and distal arm below deltoid insertion UE supported on pillow at in adducted position    Passive ROM Lt UE supported on pillw and by PT for ER to ~ 40 degress x 5 reps; PROM into abduction with elbow supported at 90 deg flexion to ~ 30 deg x 5                  PT Education - 03/26/21 1433    Education Details HEP    Person(s) Educated Patient    Methods Explanation;Demonstration;Tactile cues;Verbal cues;Handout    Comprehension Verbalized understanding;Returned demonstration;Verbal cues required;Tactile cues required               PT Long Term Goals - 03/18/21 1257      PT LONG TERM GOAL #1   Title Improve posture and alignment with patient to demonstrate improved upright posture with posterior shoulder girdle engaged    Time 6    Period Weeks    Status New    Target Date 04/29/21      PT LONG TERM GOAL #2   Title PROM to [facility] Lt shoulder as tissue condition indicates and MD approves    Time 6    Period Weeks    Status New    Target Date 04/29/21      PT LONG TERM GOAL #3   Title AROM to strengthening Lt UE as  indicated and MD approves working to improve strength in Lt shoulder girdle to compensate for absent rotator cuff strength    Time 6    Period Weeks    Status New    Target Date 04/29/21      PT LONG TERM GOAL #  4   Title Independent in HEP    Time 6    Period Weeks    Status New    Target Date 04/29/21      PT LONG TERM GOAL #5   Title Improve functional limitation score to 63    Time 6    Period Weeks    Status New    Target Date 04/29/21                 Plan - 03/26/21 1413    Clinical Impression Statement Patient reports compliance with exercises except table slide which hurts. Patient instructed in AAROM Lt shoulder flexion standing at counter stepping back to pt tolerance - no pain. Note tightness in supination and wrist flexion/extension. Added AAROM/PROM for these motions. Noted edema Lt hand and forearm. Added retrograde massage with positive response noted some improvement in edema. Encouraged hand and finger exercises to assist with edema management. TC/LM for Dr Griffin Basil to seek guidelines for shoulder ORM due to spacer in the [facility] joint. No response to message sent last week via epic. Will protect joint until further clarification on precautions.    Rehab Potential Good    PT Frequency 2x / week    PT Duration 6 weeks    PT Treatment/Interventions ADLs/Self Care Home Management;Aquatic Therapy;Cryotherapy;Electrical Stimulation;Iontophoresis 4mg /ml Dexamethasone;Moist Heat;Ultrasound;Therapeutic activities;Therapeutic exercise;Neuromuscular re-education;Patient/family education;Manual techniques;Passive range of motion;Dry needling;Taping    PT Next Visit Plan review HEP; progress with PROM to [facility]; work on scapular stability; manual work and modalities as indicated - await clarification on precautions for shoulder ROM past gentle self range in protected planes of movement.    PT Home Exercise Plan [email or web address]    Consulted and Agree with Plan of Care Patient            Patient will benefit from skilled therapeutic intervention in order to improve the following deficits and impairments:     Visit Diagnosis: Acute pain of left shoulder  Muscle weakness (generalized)  Abnormal posture  Dysphagia, unspecified type     Problem List Patient Active Problem List   Diagnosis Date Noted  . Penicillin allergy   . Chronic osteomyelitis ([place])   . Diabetic foot infection ([place])   . S/p total knee replacement, bilateral   . Septic arthritis of shoulder, left ([place]) 03/03/2021  . Atrial fibrillation, chronic ([place]) 03/03/2021  . Chronic anticoagulation 03/03/2021  . DM (diabetes mellitus), type 2 with neurological complications ([place]) 11/24/7587  . Essential hypertension 03/03/2021  . Hypokalemia 03/03/2021  . Prolonged QT interval 03/03/2021  . COPD (chronic obstructive pulmonary disease) ([place]) 03/03/2021  . Chronic CHF (congestive heart failure) ([place]) 03/03/2021  . Diabetic peripheral neuropathy ([place]) 01/03/2019  . Obesity 06/01/2016  . Wound dehiscence 05/04/2016  . Knee osteoarthritis 04/06/2016    Treena Cosman Nilda Simmer PT, MPH  03/26/2021, 2:47 PM   Addendum: Received return call from [place] at Dr Rich Fuchs office - No PROM or AROM for Lt shoulder. Can work with elbow, wrist, hand no lifting > 1 pound.  TC to patient - to modify HEP to avoid any PROM for Lt shoulder at home - will continue with exercises for elbow/wrist/hand. He verbalized understanding.   [facility] [place], [name], [address] Phone: [phone]   Fax:  [phone]  Name: Willie Pineda MRN: [id number] Date of Birth: 15-Aug-1946

## 2021-03-27 DIAGNOSIS — L97522 Non-pressure chronic ulcer of other part of left foot with fat layer exposed: Secondary | ICD-10-CM | POA: Diagnosis not present

## 2021-03-27 DIAGNOSIS — E1142 Type 2 diabetes mellitus with diabetic polyneuropathy: Secondary | ICD-10-CM | POA: Diagnosis not present

## 2021-03-27 DIAGNOSIS — E11621 Type 2 diabetes mellitus with foot ulcer: Secondary | ICD-10-CM | POA: Diagnosis not present

## 2021-03-27 DIAGNOSIS — M2042 Other hammer toe(s) (acquired), left foot: Secondary | ICD-10-CM | POA: Diagnosis not present

## 2021-03-27 DIAGNOSIS — M2041 Other hammer toe(s) (acquired), right foot: Secondary | ICD-10-CM | POA: Diagnosis not present

## 2021-03-27 DIAGNOSIS — L97529 Non-pressure chronic ulcer of other part of left foot with unspecified severity: Secondary | ICD-10-CM | POA: Diagnosis not present

## 2021-03-30 ENCOUNTER — Encounter: Payer: Self-pay | Admitting: Rehabilitative and Restorative Service Providers"

## 2021-03-30 ENCOUNTER — Ambulatory Visit (INDEPENDENT_AMBULATORY_CARE_PROVIDER_SITE_OTHER): Payer: Medicare Other | Admitting: Rehabilitative and Restorative Service Providers"

## 2021-03-30 ENCOUNTER — Other Ambulatory Visit: Payer: Self-pay

## 2021-03-30 DIAGNOSIS — M6281 Muscle weakness (generalized): Secondary | ICD-10-CM | POA: Diagnosis not present

## 2021-03-30 DIAGNOSIS — R293 Abnormal posture: Secondary | ICD-10-CM

## 2021-03-30 DIAGNOSIS — M25512 Pain in left shoulder: Secondary | ICD-10-CM

## 2021-03-30 NOTE — Patient Instructions (Signed)
No PROM; no AROM Lt shoulder per MD   Continue with AROM Lt elbow; forearm; wrist; hand; fingers as well as retrograde massage Lt hand and forearm  Remain in sling or with UE supported on pillow at side. In sling for sleeping

## 2021-03-30 NOTE — Therapy (Addendum)
[place], [name], [address] Phone: [phone]   Fax:  [phone]  Physical Therapy Treatment  Patient Details  Name: Willie Pineda MRN: [id number] Date of Birth: 05/13/1946 Referring Provider (PT): Dr [name]   Encounter Date: 03/30/2021   PT End of Session - 03/30/21 1351    Visit Number 3    Number of Visits 12    Date for PT Re-Evaluation 04/29/21    PT Start Time 2119    PT Stop Time 1409    PT Time Calculation (min) 15 min    Activity Tolerance Patient tolerated treatment well           Past Medical History:  Diagnosis Date  . Arthritis    "hands primarily" (05/05/2016)  . Atrial fibrillation ([place])   . Back pain    age related  . CHF (congestive heart failure) ([place])   . Complication of anesthesia    Bronchial spasms  . COPD (chronic obstructive pulmonary disease) (H[facility])    inhalers as needed  . Depression    takes Zoloft daily  . Diabetic peripheral neuropathy ([place]) 01/03/2019  . Dyspnea    with exertion  . Dysrhythmia    A Fib-takes Xarelto daily  . Enlarged prostate    benign  . GERD (gastroesophageal reflux disease)    takes Omeprazole daily  . 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 Hyperlipidemia    takes Atorvastatin daily  . Hypertension    takes Metoprolol,HCTZ,and Losartan daily.Takes Clonidine if needed  . Nocturia   . Peripheral neuropathy    takes Gabapentin daily  . Pneumonia ?2015  . Restless leg   . Sleep apnea    uses Bipap  . Stroke [facility]) 2012   denies residual on 05/05/2016  . Type II diabetes mellitus ([place])    borderline but doesn't take any meds (05/05/2016)    Past Surgical History:  Procedure Laterality Date  . CATARACT EXTRACTION W/ INTRAOCULAR LENS  IMPLANT, BILATERAL Bilateral   . COLONOSCOPY WITH PROPOFOL N/A 03/09/2017   Procedure: COLONOSCOPY WITH PROPOFOL;  Surgeon: Wonda Horner, MD;  Location:  [facility] ENDOSCOPY;  Service: Endoscopy;  Laterality: N/A;  . COLONOSCOPY WITH PROPOFOL N/A 04/03/2020   Procedure: COLONOSCOPY WITH PROPOFOL;  Surgeon: Wonda Horner, MD;  Location: [facility] ENDOSCOPY;  Service: Endoscopy;  Laterality: N/A;  . I & D KNEE WITH POLY EXCHANGE Right 05/04/2016   IRRIGATION AND DEBRIDEMENT Right KNEE WITH UNICOMPARTMENTAL POLY EXCHANGE  . I & D KNEE WITH POLY EXCHANGE Right 05/04/2016   Procedure: IRRIGATION AND DEBRIDEMENT Right KNEE WITH UNICOMPARTMENTAL POLY EXCHANGE;  Surgeon: Renette Butters, MD;  Location: [place];  Service: Orthopedics;  Laterality: Right;  . JOINT REPLACEMENT    . LAPAROSCOPIC CHOLECYSTECTOMY  1995  . PARTIAL KNEE ARTHROPLASTY Right 04/06/2016   Procedure: UNICOMPARTMENTAL RIGHT KNEE;  Surgeon: Renette Butters, MD;  Location: [place];  Service: Orthopedics;  Laterality: Right;  . POLYPECTOMY  04/03/2020   Procedure: POLYPECTOMY;  Surgeon: Wonda Horner, MD;  Location: [facility] ENDOSCOPY;  Service: Endoscopy;;  . STRABISMUS SURGERY Bilateral 06/10/2017   Procedure: REPAIR STRABISMUS BILATERAL;  Surgeon: Everitt Amber, MD;  Location: [place];  Service: Ophthalmology;  Laterality: Bilateral;  . TONSILLECTOMY AND ADENOIDECTOMY    . TOTAL KNEE ARTHROPLASTY Left 2008  . TOTAL SHOULDER ARTHROPLASTY Left 03/04/2021   Procedure: Total Shoulder Arthroplasty;  Surgeon: [name], MD;  Location: [facility] ORS;  Service: Orthopedics;  Laterality: Left;  . ULNAR NERVE TRANSPOSITION Right 1989    There were no vitals filed for this visit.   Subjective Assessment - 03/30/21 1353    Subjective Patient reports that he has not been doing any shoulder exercises. He has tweaked it a couple of times when dressing or putting on deodorant. He does have some pain with certain movements of the shoulder    Currently in Pain? No/denies    Pain Score 0-No pain              [facility] PT Assessment - 03/30/21 0001      Assessment   Medical Diagnosis Lt shoulder  debridement    Referring Provider (PT) Dr [name]    Onset Date/Surgical Date 03/04/21   prior injury 20 yrs ago   Hand Dominance Right    Next MD Visit 03/31/21    Prior Therapy here for knees      Precautions   Precautions Shoulder    Precaution Comments no PROM/AROM Rt shoulder      AROM   Overall AROM Comments NT Lt shoulder; Lt elbow; forearm; wrist; hand WFL's and ~ equal to Rt UE      Strength   Overall Strength Comments Rt UE WFL's - not tested on the Lt                         [facility] Adult PT Treatment/Exercise - 03/30/21 0001      Self-Care   Self-Care Other Self-Care Comments    Other Self-Care Comments  reviewed retrograde massage Lt hand and forearmto address edema Lt hand and forearm      Therapeutic Activites    Therapeutic Activities Other Therapeutic Activities      Shoulder Exercises: Seated   Retraction AROM;Both;10 reps   5-10 sec hold w/chest lift working on pulling shoulder blades down and back - shoulder supported at side   Other Seated Exercises elbow flexion/extension; forearm supination/pronation; wrist flexion/extension; fisting; thumb to finger tips. Insturcted in exercises for dexterity for Lt hand                  PT Education - 03/30/21 1417    Education Details Review HEP    Person(s) Educated Patient    Methods Explanation;Demonstration;Tactile cues;Verbal cues    Comprehension Verbalized understanding;Returned demonstration;Verbal cues required               PT Long Term Goals - 03/18/21 1257      PT LONG TERM GOAL #1   Title Improve posture and alignment with patient to demonstrate improved upright posture with posterior shoulder girdle engaged    Time 6    Period Weeks    Status New    Target Date 04/29/21      PT LONG TERM GOAL #2   Title PROM to [facility] Lt shoulder as tissue condition indicates and MD approves    Time 6    Period Weeks    Status New    Target Date 04/29/21      PT LONG TERM GOAL  #3   Title AROM to strengthening Lt UE as indicated and MD approves working to improve strength in Lt shoulder girdle to compensate for absent rotator cuff strength    Time 6    Period Weeks    Status New    Target Date 04/29/21  PT LONG TERM GOAL #4   Title Independent in HEP    Time 6    Period Weeks    Status New    Target Date 04/29/21      PT LONG TERM GOAL #5   Title Improve functional limitation score to 63    Time 6    Period Weeks    Status New    Target Date 04/29/21                 Plan - 03/30/21 1418    Clinical Impression Statement Patient reports some sharp pain in Lt shoulder with dressing or putting on deodorant or at times when he moves in an awkward way. No PROM, no AROM Lt shoudler until further orders from MD. Reviewed exercises for Lt elbow flexion/extension; supination/pronation; wrist flexion/extension; hand fisting and opening; opposition thumb to each digit; dexeterity activities for Lt hand.    Rehab Potential Good    PT Frequency 2x / week    PT Duration 6 weeks    PT Treatment/Interventions ADLs/Self Care Home Management;Aquatic Therapy;Cryotherapy;Electrical Stimulation;Iontophoresis 4mg /ml Dexamethasone;Moist Heat;Ultrasound;Therapeutic activities;Therapeutic exercise;Neuromuscular re-education;Patient/family education;Manual techniques;Passive range of motion;Dry needling;Taping    PT Next Visit Plan review HEP; progress with PROM to [facility] only as ordered by MD; scapular stability; manual work and modalities as indicated    PT [place] and Agree with Plan of Care Patient           Patient will benefit from skilled therapeutic intervention in order to improve the following deficits and impairments:     Visit Diagnosis: Acute pain of left shoulder  Muscle weakness (generalized)  Abnormal posture     Problem List Patient Active Problem List   Diagnosis Date Noted  . Penicillin allergy   .  Chronic osteomyelitis ([place])   . Diabetic foot infection ([place])   . S/p total knee replacement, bilateral   . Septic arthritis of shoulder, left ([place]) 03/03/2021  . Atrial fibrillation, chronic ([place]) 03/03/2021  . Chronic anticoagulation 03/03/2021  . DM (diabetes mellitus), type 2 with neurological complications ([place]) 81/18/8677  . Essential hypertension 03/03/2021  . Hypokalemia 03/03/2021  . Prolonged QT interval 03/03/2021  . COPD (chronic obstructive pulmonary disease) ([place]) 03/03/2021  . Chronic CHF (congestive heart failure) ([place]) 03/03/2021  . Diabetic peripheral neuropathy ([place]) 01/03/2019  . Obesity 06/01/2016  . Wound dehiscence 05/04/2016  . Knee osteoarthritis 04/06/2016    Vickey Ewbank Nilda Simmer PT, MPH  03/30/2021, 2:22 PM  [facility] [place], [name], [address] Phone: [phone]   Fax:  [phone]  Name: Willie Pineda MRN: [id number] Date of Birth: 1946-04-11  PHYSICAL THERAPY DISCHARGE SUMMARY  Visits from Start of Care: 3  Current functional level related to goals / functional outcomes: See progress note for discharge status    Remaining deficits: Unknown    Education / Equipment: HEP   Plan: Patient agrees to discharge.  Patient goals were not met. Patient is being discharged due to a change in medical status.  ?????    Lavar Rosenzweig P. Helene Kelp PT, MPH 04/21/21 9:16 AM

## 2021-03-31 DIAGNOSIS — M19012 Primary osteoarthritis, left shoulder: Secondary | ICD-10-CM | POA: Diagnosis not present

## 2021-04-01 ENCOUNTER — Other Ambulatory Visit: Payer: Self-pay | Admitting: Physician Assistant

## 2021-04-01 ENCOUNTER — Ambulatory Visit: Payer: Medicare Other | Admitting: Orthopedic Surgery

## 2021-04-01 ENCOUNTER — Other Ambulatory Visit: Payer: Self-pay

## 2021-04-01 ENCOUNTER — Encounter: Payer: Self-pay | Admitting: Physician Assistant

## 2021-04-01 DIAGNOSIS — M869 Osteomyelitis, unspecified: Secondary | ICD-10-CM

## 2021-04-01 DIAGNOSIS — M79675 Pain in left toe(s): Secondary | ICD-10-CM

## 2021-04-01 NOTE — Progress Notes (Signed)
Office Visit Note   Patient: Willie Pineda           Date of Birth: [birth year]           MRN: [id number] Visit Date: 04/01/2021              Requested by: Josetta Huddle, MD [address] [place] [address] [place],  [place] [address] PCP: Josetta Huddle, MD  Chief Complaint  Patient presents with  . Left Foot - Pain    2nd toe       HPI: Patient is a 75 year old gentleman who is seen for initial evaluation for swelling cellulitis and ulceration left foot second toe.  Patient has had a shoulder infection has a history of type 2 diabetes.  Patient denies a history of gout.  He is currently on doxycycline.    Assessment & Plan: Visit Diagnoses:  1. Pain in left toe(s)   2. Osteomyelitis of second toe of left foot ([place])     Plan: We will plan for a left foot second ray amputation as outpatient surgery.  Risk and benefits were discussed including the importance of nonweightbearing.  Patient states he understands wished to proceed at this time.  We will obtain a uric acid level.  Follow-Up Instructions: Return in about 1 week (around 04/08/2021).   Ortho Exam  Patient is alert, oriented, no adenopathy, well-dressed, normal affect, normal respiratory effort. Examination patient has a palpable dorsalis pedis and posterior tibial pulse that is irregular.  The Doppler was used and he has a biphasic dorsalis pedis and a triphasic posterior tibial pulse that is irregular consistent with his A. fib patient is on Xarelto.  He has redness and sausage digit swelling of the left second toe.  There is a distal ulcer.  Review of the radiographs shows destructive bony changes of the tuft of the second toe as well as MRI scan which shows bony changes consistent with either infection or AVN.  Patient's last hemoglobin A1c was 7.0.  Patient's radiographs also show periarticular cystic changes through the midfoot consistent with a chronic history of gout.  Imaging: No results found. No images are attached  to the encounter.  Labs: Lab Results  Component Value Date   HGBA1C 7.0 (H) 05/04/2016   ESRSEDRATE 28 01/03/2019   ESRSEDRATE 35 (H) 05/05/2016   CRP 4.9 (H) 03/05/2021   CRP 2.1 (H) 05/05/2016   REPTSTATUS 03/11/2021 FINAL 03/04/2021   GRAMSTAIN  03/04/2021    RARE WBC PRESENT, PREDOMINANTLY PMN NO ORGANISMS SEEN Performed at [facility] [address] [place], [place] [address]    CULT RARE PROPIONIBACTERIUM ACNES 03/04/2021     Lab Results  Component Value Date   ALBUMIN 3.7 03/25/2016    No results found for: MG No results found for: VD25OH  No results found for: PREALBUMIN CBC EXTENDED Latest Ref Rng & Units 03/06/2021 03/05/2021 03/04/2021  WBC 4.0 - 10.5 K/uL 11.8(H) 12.7(H) 12.1(H)  RBC 4.22 - 5.81 MIL/uL 3.63(L) 3.84(L) 4.32  HGB 13.0 - 17.0 g/dL 11.7(L) 12.3(L) 13.8  HCT 39.0 - 52.0 % 36.7(L) 38.8(L) 42.1  PLT 150 - 400 K/uL 282 304 340  NEUTROABS 1.7 - 7.7 K/uL 8.5(H) 9.4(H) -  LYMPHSABS 0.7 - 4.0 K/uL 2.0 2.3 -     There is no height or weight on file to calculate BMI.  Orders:  Orders Placed This Encounter  Procedures  . Uric acid   No orders of the defined types were placed  in this encounter.    Procedures: No procedures performed  Clinical Data: No additional findings.  ROS:  All other systems negative, except as noted in the HPI. Review of Systems  Objective: Vital Signs: There were no vitals taken for this visit.  Specialty Comments:  No specialty comments available.  PMFS History: Patient Active Problem List   Diagnosis Date Noted  . Penicillin allergy   . Chronic osteomyelitis ([place])   . Diabetic foot infection ([place])   . S/p total knee replacement, bilateral   . Septic arthritis of shoulder, left ([place]) 03/03/2021  . Atrial fibrillation, chronic ([place]) 03/03/2021  . Chronic anticoagulation 03/03/2021  . DM (diabetes mellitus), type 2 with neurological complications ([place]) 25/95/6387  . Essential hypertension 03/03/2021   . Hypokalemia 03/03/2021  . Prolonged QT interval 03/03/2021  . COPD (chronic obstructive pulmonary disease) ([place]) 03/03/2021  . Chronic CHF (congestive heart failure) ([place]) 03/03/2021  . Diabetic peripheral neuropathy ([place]) 01/03/2019  . Obesity 06/01/2016  . Wound dehiscence 05/04/2016  . Knee osteoarthritis 04/06/2016   Past Medical History:  Diagnosis Date  . Arthritis    "hands primarily" (05/05/2016)  . Atrial fibrillation ([place])   . Back pain    age related  . CHF (congestive heart failure) ([place])   . Complication of anesthesia    Bronchial spasms  . COPD (chronic obstructive pulmonary disease) ([facility])    inhalers as needed  . Depression    takes Zoloft daily  . Diabetic peripheral neuropathy ([place]) 01/03/2019  . Dyspnea    with exertion  . Dysrhythmia    A Fib-takes Xarelto daily  . Enlarged prostate    benign  . GERD (gastroesophageal reflux disease)    takes Omeprazole daily  . 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 Hyperlipidemia    takes Atorvastatin daily  . Hypertension    takes Metoprolol,HCTZ,and Losartan daily.Takes Clonidine if needed  . Nocturia   . Peripheral neuropathy    takes Gabapentin daily  . Pneumonia ?2015  . Restless leg   . Sleep apnea    uses Bipap  . Stroke [facility]) 2012   denies residual on 05/05/2016  . Type II diabetes mellitus (H[facility])    borderline but doesn't take any meds (05/05/2016)    Family History  Problem Relation Age of Onset  . CVA Mother   . Liver cancer Father     Past Surgical History:  Procedure Laterality Date  . CATARACT EXTRACTION W/ INTRAOCULAR LENS  IMPLANT, BILATERAL Bilateral   . COLONOSCOPY WITH PROPOFOL N/A 03/09/2017   Procedure: COLONOSCOPY WITH PROPOFOL;  Surgeon: Wonda Horner, MD;  Location: [facility] ENDOSCOPY;  Service: Endoscopy;  Laterality: N/A;  . COLONOSCOPY WITH PROPOFOL N/A 04/03/2020   Procedure: COLONOSCOPY WITH PROPOFOL;  Surgeon: Wonda Horner, MD;  [facility]  ENDOSCOPY;  Service: Endoscopy;  Laterality: N/A;  . I & D KNEE WITH POLY EXCHANGE Right 05/04/2016   IRRIGATION AND DEBRIDEMENT Right KNEE WITH UNICOMPARTMENTAL POLY EXCHANGE  . I & D KNEE WITH POLY EXCHANGE Right 05/04/2016   Procedure: IRRIGATION AND DEBRIDEMENT Right KNEE WITH UNICOMPARTMENTAL POLY EXCHANGE;  Surgeon: Renette Butters, MD;  Location: [place];  Service: Orthopedics;  Laterality: Right;  . JOINT REPLACEMENT    . LAPAROSCOPIC CHOLECYSTECTOMY  1995  . PARTIAL KNEE ARTHROPLASTY Right 04/06/2016   Procedure: UNICOMPARTMENTAL RIGHT KNEE;  Surgeon: Renette Butters, MD;  Location: [place];  Service: Orthopedics;  Laterality:  Right;  Marland Kitchen POLYPECTOMY  04/03/2020   Procedure: POLYPECTOMY;  Surgeon: Wonda Horner, MD;  [facility] ENDOSCOPY;  Service: Endoscopy;;  . STRABISMUS SURGERY Bilateral 06/10/2017   Procedure: REPAIR STRABISMUS BILATERAL;  Surgeon: Everitt Amber, MD;  Location: [place];  Service: Ophthalmology;  Laterality: Bilateral;  . TONSILLECTOMY AND ADENOIDECTOMY    . TOTAL KNEE ARTHROPLASTY Left 2008  . TOTAL SHOULDER ARTHROPLASTY Left 03/04/2021   Procedure: Total Shoulder Arthroplasty;  Surgeon: [name], MD;  [facility];  Service: Orthopedics;  Laterality: Left;  . ULNAR NERVE TRANSPOSITION Right 1989   Social History   Occupational History  . Occupation: Retired Therapist, sports  Tobacco Use  . Smoking status: Former Smoker    Packs/day: 0.50    Years: 40.00    Pack years: 20.00    Types: Cigarettes    Quit date: 07/15/2011    Years since quitting: 9.7  . Smokeless tobacco: Former Systems developer    Types: Secondary school teacher  . Vaping Use: Never used  Substance and Sexual Activity  . Alcohol use: Yes    Comment: maybe 2 glases wine every other week  . Drug use: No  . Sexual activity: Yes

## 2021-04-02 ENCOUNTER — Other Ambulatory Visit: Payer: Self-pay | Admitting: Orthopedic Surgery

## 2021-04-02 ENCOUNTER — Encounter: Payer: Medicare Other | Admitting: Rehabilitative and Restorative Service Providers"

## 2021-04-02 ENCOUNTER — Telehealth: Payer: Self-pay

## 2021-04-02 ENCOUNTER — Other Ambulatory Visit (HOSPITAL_COMMUNITY)
Admission: RE | Admit: 2021-04-02 | Discharge: 2021-04-02 | Disposition: A | Discharge status: Home / Self Care | Payer: Medicare Other | Source: Ambulatory Visit | Attending: Orthopedic Surgery | Admitting: Orthopedic Surgery

## 2021-04-02 ENCOUNTER — Encounter (HOSPITAL_COMMUNITY): Payer: Self-pay | Admitting: Orthopedic Surgery

## 2021-04-02 ENCOUNTER — Other Ambulatory Visit: Payer: Self-pay

## 2021-04-02 DIAGNOSIS — Z20822 Contact with and (suspected) exposure to covid-19: Secondary | ICD-10-CM | POA: Insufficient documentation

## 2021-04-02 DIAGNOSIS — Z01812 Encounter for preprocedural laboratory examination: Secondary | ICD-10-CM | POA: Diagnosis not present

## 2021-04-02 LAB — SARS CORONAVIRUS 2 (TAT 6-24 HRS): SARS Coronavirus 2: NEGATIVE

## 2021-04-02 LAB — URIC ACID: Uric Acid, Serum: 9.5 mg/dL — ABNORMAL HIGH (ref 4.0–8.0)

## 2021-04-02 MED ORDER — COLCHICINE 0.6 MG PO TABS: 0.6000 mg | ORAL_TABLET | Freq: Every day | ORAL | 12 refills | Status: AC

## 2021-04-02 MED ORDER — ALLOPURINOL 100 MG PO TABS: 100.0000 mg | ORAL_TABLET | Freq: Two times a day (BID) | ORAL | 12 refills | Status: DC

## 2021-04-02 MED ORDER — COLCHICINE 0.6 MG PO TABS: 0.6000 mg | ORAL_TABLET | Freq: Every day | ORAL | 12 refills | Status: DC

## 2021-04-02 NOTE — Progress Notes (Addendum)
Mr. Willie Pineda denies chest pain or shortness of breath. Patient was tested for Covid and has been in quarantine since that time. Mr. Willie Pineda has type II diabetes, patient report that CBGs run around 140. Last A1C was 6.5.  I instructed patient to check CBG after awaking and every 2 hours until arrival  to the hospital.  I Instructed patient if CBG is less than 70 to take 4 Glucose Tablets  Recheck CBG in 15 minutes if CBG is not over 70 call, pre- op desk at [phone] for further instructions.   Mr. Willie Pineda reports that he has 2 blisters, he is not sure what caused the blisters , I recommended that he cover the blisters prior to coming to the hospital.

## 2021-04-02 NOTE — Telephone Encounter (Signed)
I called pt and advised of lab results and medication that has been called in. I also made post op appt for next Friday sch for ray amputation tomorrow.  Voiced understanding and will call with any questions.

## 2021-04-03 ENCOUNTER — Ambulatory Visit (HOSPITAL_COMMUNITY): Payer: Medicare Other | Admitting: Certified Registered"

## 2021-04-03 ENCOUNTER — Ambulatory Visit (HOSPITAL_COMMUNITY)
Admission: RE | Admit: 2021-04-03 | Discharge: 2021-04-03 | Disposition: A | Discharge status: Home / Self Care | Payer: Medicare Other | Attending: Orthopedic Surgery | Admitting: Orthopedic Surgery

## 2021-04-03 ENCOUNTER — Other Ambulatory Visit: Payer: Self-pay

## 2021-04-03 ENCOUNTER — Encounter (HOSPITAL_COMMUNITY)
Admission: RE | Disposition: A | Discharge status: Home / Self Care | Payer: Self-pay | Source: Home / Self Care | Attending: Orthopedic Surgery

## 2021-04-03 ENCOUNTER — Encounter (HOSPITAL_COMMUNITY): Payer: Self-pay | Admitting: Orthopedic Surgery

## 2021-04-03 DIAGNOSIS — Z96652 Presence of left artificial knee joint: Secondary | ICD-10-CM | POA: Diagnosis not present

## 2021-04-03 DIAGNOSIS — I509 Heart failure, unspecified: Secondary | ICD-10-CM | POA: Diagnosis not present

## 2021-04-03 DIAGNOSIS — Z87891 Personal history of nicotine dependence: Secondary | ICD-10-CM | POA: Insufficient documentation

## 2021-04-03 DIAGNOSIS — M86672 Other chronic osteomyelitis, left ankle and foot: Secondary | ICD-10-CM | POA: Diagnosis not present

## 2021-04-03 DIAGNOSIS — I110 Hypertensive heart disease with heart failure: Secondary | ICD-10-CM | POA: Diagnosis not present

## 2021-04-03 DIAGNOSIS — Z880 Allergy status to penicillin: Secondary | ICD-10-CM | POA: Insufficient documentation

## 2021-04-03 DIAGNOSIS — Z888 Allergy status to other drugs, medicaments and biological substances status: Secondary | ICD-10-CM | POA: Insufficient documentation

## 2021-04-03 DIAGNOSIS — Z96612 Presence of left artificial shoulder joint: Secondary | ICD-10-CM | POA: Insufficient documentation

## 2021-04-03 DIAGNOSIS — M869 Osteomyelitis, unspecified: Secondary | ICD-10-CM

## 2021-04-03 DIAGNOSIS — E1169 Type 2 diabetes mellitus with other specified complication: Secondary | ICD-10-CM | POA: Insufficient documentation

## 2021-04-03 DIAGNOSIS — M868X7 Other osteomyelitis, ankle and foot: Secondary | ICD-10-CM | POA: Insufficient documentation

## 2021-04-03 DIAGNOSIS — Z885 Allergy status to narcotic agent status: Secondary | ICD-10-CM | POA: Insufficient documentation

## 2021-04-03 HISTORY — DX: Peripheral vascular disease, unspecified: I73.9

## 2021-04-03 HISTORY — PX: AMPUTATION: SHX166

## 2021-04-03 LAB — GLUCOSE, CAPILLARY
Glucose-Capillary: 145 mg/dL — ABNORMAL HIGH (ref 70–99)
Glucose-Capillary: 117 mg/dL — ABNORMAL HIGH (ref 70–99)

## 2021-04-03 LAB — CBC
HCT: 40.5 % (ref 39.0–52.0)
Hemoglobin: 13.3 g/dL (ref 13.0–17.0)
MCH: 32.3 pg (ref 26.0–34.0)
MCHC: 32.8 g/dL (ref 30.0–36.0)
MCV: 98.3 fL (ref 80.0–100.0)
Platelets: 233 10*3/uL (ref 150–400)
RBC: 4.12 MIL/uL — ABNORMAL LOW (ref 4.22–5.81)
RDW: 14.6 % (ref 11.5–15.5)
WBC: 9.2 10*3/uL (ref 4.0–10.5)
nRBC: 0 % (ref 0.0–0.2)

## 2021-04-03 LAB — BASIC METABOLIC PANEL
Anion gap: 10 (ref 5–15)
BUN: 18 mg/dL (ref 8–23)
CO2: 28 mmol/L (ref 22–32)
Calcium: 8.7 mg/dL — ABNORMAL LOW (ref 8.9–10.3)
Chloride: 98 mmol/L (ref 98–111)
Creatinine, Ser: 1.2 mg/dL (ref 0.61–1.24)
GFR, Estimated: >60 mL/min (ref >60)
Glucose, Bld: 137 mg/dL — ABNORMAL HIGH (ref 70–99)
Potassium: 3.5 mmol/L (ref 3.5–5.1)
Sodium: 136 mmol/L (ref 135–145)

## 2021-04-03 SURGERY — AMPUTATION, FOOT, RAY
Anesthesia: Monitor Anesthesia Care | Laterality: Left

## 2021-04-03 MED ORDER — FENTANYL CITRATE (PF) 100 MCG/2ML IJ SOLN
INTRAMUSCULAR | Status: AC
  Administered 2021-04-03: 100 ug via INTRAVENOUS
  Filled 2021-04-03: qty 2

## 2021-04-03 MED ORDER — ORAL CARE MOUTH RINSE: 15.0000 mL | Freq: Once | OROMUCOSAL | Status: AC

## 2021-04-03 MED ORDER — MIDAZOLAM HCL 2 MG/2ML IJ SOLN
INTRAMUSCULAR | Status: AC
  Administered 2021-04-03: 2 mg via INTRAVENOUS
  Filled 2021-04-03: qty 2

## 2021-04-03 MED ORDER — LIDOCAINE 2% (20 MG/ML) 5 ML SYRINGE
INTRAMUSCULAR | Status: AC
  Filled 2021-04-03: qty 5

## 2021-04-03 MED ORDER — LACTATED RINGERS IV SOLN: INTRAVENOUS | Status: DC

## 2021-04-03 MED ORDER — CLINDAMYCIN PHOSPHATE 900 MG/50ML IV SOLN
900.0000 mg | INTRAVENOUS | Status: AC
  Administered 2021-04-03: 900 mg via INTRAVENOUS
  Filled 2021-04-03: qty 50

## 2021-04-03 MED ORDER — MIDAZOLAM HCL 2 MG/2ML IJ SOLN: 2.0000 mg | Freq: Once | INTRAMUSCULAR | Status: AC

## 2021-04-03 MED ORDER — PHENYLEPHRINE 40 MCG/ML (10ML) SYRINGE FOR IV PUSH (FOR BLOOD PRESSURE SUPPORT)
PREFILLED_SYRINGE | INTRAVENOUS | Status: DC | PRN
  Administered 2021-04-03: 80 ug via INTRAVENOUS

## 2021-04-03 MED ORDER — CHLORHEXIDINE GLUCONATE 0.12 % MT SOLN
15.0000 mL | Freq: Once | OROMUCOSAL | Status: AC
  Administered 2021-04-03: 15 mL via OROMUCOSAL
  Filled 2021-04-03: qty 15

## 2021-04-03 MED ORDER — FENTANYL CITRATE (PF) 100 MCG/2ML IJ SOLN: 100.0000 ug | Freq: Once | INTRAMUSCULAR | Status: AC

## 2021-04-03 MED ORDER — PROPOFOL 10 MG/ML IV BOLUS
INTRAVENOUS | Status: AC
  Filled 2021-04-03: qty 20

## 2021-04-03 MED ORDER — OXYCODONE-ACETAMINOPHEN 5-325 MG PO TABS: 1.0000 | ORAL_TABLET | ORAL | 0 refills | Status: DC | PRN

## 2021-04-03 MED ORDER — EPHEDRINE 5 MG/ML INJ
INTRAVENOUS | Status: AC
  Filled 2021-04-03: qty 10

## 2021-04-03 MED ORDER — PHENYLEPHRINE 40 MCG/ML (10ML) SYRINGE FOR IV PUSH (FOR BLOOD PRESSURE SUPPORT)
PREFILLED_SYRINGE | INTRAVENOUS | Status: AC
  Filled 2021-04-03: qty 10

## 2021-04-03 MED ORDER — EPHEDRINE SULFATE-NACL 50-0.9 MG/10ML-% IV SOSY
PREFILLED_SYRINGE | INTRAVENOUS | Status: DC | PRN
  Administered 2021-04-03: 5 mg via INTRAVENOUS

## 2021-04-03 MED ORDER — PROPOFOL 10 MG/ML IV BOLUS
INTRAVENOUS | Status: DC | PRN
  Administered 2021-04-03 (×2): 20 mg via INTRAVENOUS
  Administered 2021-04-03: 10 mg via INTRAVENOUS

## 2021-04-03 MED ORDER — 0.9 % SODIUM CHLORIDE (POUR BTL) OPTIME
TOPICAL | Status: DC | PRN
  Administered 2021-04-03: 1000 mL

## 2021-04-03 SURGICAL SUPPLY — 29 items
BLADE SAW SGTL MED 73X18.5 STR (BLADE) IMPLANT
BLADE SURG 21 STRL SS (BLADE) ×2 IMPLANT
BNDG COHESIVE 4X5 TAN STRL (GAUZE/BANDAGES/DRESSINGS) ×2 IMPLANT
BNDG COHESIVE 6X5 TAN STRL LF (GAUZE/BANDAGES/DRESSINGS) ×2 IMPLANT
BNDG GAUZE ELAST 4 BULKY (GAUZE/BANDAGES/DRESSINGS) ×2 IMPLANT
COVER SURGICAL LIGHT HANDLE (MISCELLANEOUS) ×4 IMPLANT
COVER WAND RF STERILE (DRAPES) ×2 IMPLANT
DRAPE U-SHAPE 47X51 STRL (DRAPES) ×4 IMPLANT
DRSG ADAPTIC 3X8 NADH LF (GAUZE/BANDAGES/DRESSINGS) ×2 IMPLANT
DRSG PAD ABDOMINAL 8X10 ST (GAUZE/BANDAGES/DRESSINGS) ×2 IMPLANT
DURAPREP 26ML APPLICATOR (WOUND CARE) ×2 IMPLANT
ELECT REM PT RETURN 9FT ADLT (ELECTROSURGICAL) ×2
ELECTRODE REM PT RTRN 9FT ADLT (ELECTROSURGICAL) ×1 IMPLANT
GAUZE SPONGE 4X4 12PLY STRL (GAUZE/BANDAGES/DRESSINGS) ×2 IMPLANT
GLOVE BIOGEL PI IND STRL 9 (GLOVE) ×1 IMPLANT
GLOVE BIOGEL PI INDICATOR 9 (GLOVE) ×1
GLOVE SURG ORTHO 9.0 STRL STRW (GLOVE) ×2 IMPLANT
GOWN STRL REUS W/ TWL XL LVL3 (GOWN DISPOSABLE) ×2 IMPLANT
GOWN STRL REUS W/TWL XL LVL3 (GOWN DISPOSABLE) ×2
KIT BASIN OR (CUSTOM PROCEDURE TRAY) ×2 IMPLANT
KIT TURNOVER KIT B (KITS) ×2 IMPLANT
NS IRRIG 1000ML POUR BTL (IV SOLUTION) ×2 IMPLANT
PACK ORTHO EXTREMITY (CUSTOM PROCEDURE TRAY) ×2 IMPLANT
PAD ARMBOARD 7.5X6 YLW CONV (MISCELLANEOUS) ×4 IMPLANT
STOCKINETTE IMPERVIOUS LG (DRAPES) IMPLANT
SUT ETHILON 2 0 PSLX (SUTURE) ×6 IMPLANT
TOWEL GREEN STERILE (TOWEL DISPOSABLE) ×2 IMPLANT
TUBE CONNECTING 12X1/4 (SUCTIONS) ×2 IMPLANT
YANKAUER SUCT BULB TIP NO VENT (SUCTIONS) ×2 IMPLANT

## 2021-04-03 NOTE — Progress Notes (Signed)
Orthopedic Tech Progress Note Patient Details:  Willie Pineda 12-30-1945 [id number] Brought crutches to PACU  Ortho Devices Type of Ortho Device: Crutches Ortho Device/Splint Interventions: Ordered   Post Interventions Patient Tolerated: Well Instructions Provided: Adjustment of device   Germaine Pomfret 04/03/2021, 4:52 PM

## 2021-04-03 NOTE — Interval H&P Note (Signed)
History and Physical Interval Note:  04/03/2021 9:29 AM  Willie Pineda  has presented today for surgery, with the diagnosis of Osteomyelitis Left 2nd Toe.  The various methods of treatment have been discussed with the patient and family. After consideration of risks, benefits and other options for treatment, the patient has consented to  Procedure(s): LEFT 2ND RAY AMPUTATION (Left) as a surgical intervention.  The patient's history has been reviewed, patient examined, no change in status, stable for surgery.  I have reviewed the patient's chart and labs.  Questions were answered to the patient's satisfaction.     Newt Minion

## 2021-04-03 NOTE — Anesthesia Preprocedure Evaluation (Signed)
Anesthesia Evaluation  Patient identified by MRN, date of birth, ID band Patient awake    Reviewed: Allergy & Precautions, NPO status , Patient's Chart, lab work & pertinent test results  Airway Mallampati: III  TM Distance: >3 FB Neck ROM: Full    Dental  (+) Teeth Intact, Dental Advisory Given   Pulmonary former smoker,    breath sounds clear to auscultation       Cardiovascular hypertension,  Rhythm:Irregular Rate:Normal     Neuro/Psych    GI/Hepatic   Endo/Other  diabetes  Renal/GU      Musculoskeletal   Abdominal (+) + obese,   Peds  Hematology   Anesthesia Other Findings   Reproductive/Obstetrics                             Anesthesia Physical Anesthesia Plan  ASA: III  Anesthesia Plan: MAC   Post-op Pain Management:  Regional for Post-op pain   Induction:   PONV Risk Score and Plan: Ondansetron and Propofol infusion  Airway Management Planned: Natural Airway and Simple Face Mask  Additional Equipment:   Intra-op Plan:   Post-operative Plan:   Informed Consent: I have reviewed the patients History and Physical, chart, labs and discussed the procedure including the risks, benefits and alternatives for the proposed anesthesia with the patient or authorized representative who has indicated his/her understanding and acceptance.     Dental advisory given  Plan Discussed with: CRNA and Anesthesiologist  Anesthesia Plan Comments:         Anesthesia Quick Evaluation

## 2021-04-03 NOTE — Anesthesia Procedure Notes (Signed)
Anesthesia Regional Block: Ankle block   Pre-Anesthetic Checklist: ,, timeout performed, Correct Patient, Correct Site, Correct Laterality, Correct Procedure, Correct Position, site marked, Risks and benefits discussed, Surgical consent,  Pre-op evaluation,  At surgeon's request  Laterality: Left  Prep: chloraprep       Needles:  Injection technique: Single-shot  Needle Type: Other      Needle Gauge: 22     Additional Needles:   Narrative:  Start time: 04/03/2021 10:35 AM End time: 04/03/2021 10:40 AM  Performed by: Personally  Anesthesiologist: Roberts Gaudy, MD  Additional Notes: 30 cc 2% lidocaine plain injected easily

## 2021-04-03 NOTE — Op Note (Signed)
04/03/2021  11:44 AM  PATIENT:  Willie Pineda    PRE-OPERATIVE DIAGNOSIS:  Osteomyelitis Left 2nd Toe  POST-OPERATIVE DIAGNOSIS:  Same  PROCEDURE:  LEFT 2ND RAY AMPUTATION  SURGEON:  Newt Minion, MD  PHYSICIAN ASSISTANT:None ANESTHESIA:   General  PREOPERATIVE INDICATIONS:  [name] is a  75 y.o. male with a diagnosis of Osteomyelitis Left 2nd Toe who failed conservative measures and elected for surgical management.    The risks benefits and alternatives were discussed with the patient preoperatively including but not limited to the risks of infection, bleeding, nerve injury, cardiopulmonary complications, the need for revision surgery, among others, and the patient was willing to proceed.  OPERATIVE IMPLANTS: None  @ENCIMAGES @  OPERATIVE FINDINGS: Good healthy bleeding tissue margins were clear  OPERATIVE PROCEDURE: Patient was brought the operating room and underwent a regional anesthetic after adequate levels anesthesia were obtained patient's left lower extremity was prepped using DuraPrep draped into a sterile field a timeout was called.  AV incision was made around the second toe and second ray.  This was carried sharply down to the metatarsal and the metatarsal was resected through the neck.  The infected toe and metatarsal head were resected in 1 block of tissue.  Electrocardio was used hemostasis the wound was irrigated with normal saline.  The wound margins were clear.  The incision was closed using 2-0 nylon sterile dressing was applied patient was taken the PACU in stable condition.   DISCHARGE PLANNING:  Antibiotic duration: Preoperative antibiotics  Weightbearing: Nonweightbearing on the left  Pain medication: Prescription for Percocet  Dressing care/ Wound VAC: Dry dressing  Ambulatory devices: Crutches  Discharge to: Home.  Follow-up: In the office 1 week post operative.

## 2021-04-03 NOTE — H&P (Signed)
Willie Pineda is an 75 y.o. male.   Chief Complaint: Left Foot osteomyelitis HPI: Patient is a 75 year old gentleman who is seen for initial evaluation for swelling cellulitis and ulceration left foot second toe.  Patient has had a shoulder infection has a history of type 2 diabetes.  Patient denies a history of gout.  He is currently on doxycycline.     Past Medical History:  Diagnosis Date  . Arthritis    "hands primarily" (05/05/2016)  . Atrial fibrillation ([place])   . Back pain    age related  . CHF (congestive heart failure) ([place])   . Complication of anesthesia    Bronchial spasms  . COPD (chronic obstructive pulmonary disease) (HCC)    inhalers as needed  . Depression    takes Zoloft daily  . Diabetic peripheral neuropathy ([place]) 01/03/2019  . Dysrhythmia    A Fib-takes Xarelto daily  . Enlarged prostate    benign  . GERD (gastroesophageal reflux disease)    takes Omeprazole daily  . 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 Hyperlipidemia    takes Atorvastatin daily  . Hypertension    takes Metoprolol,HCTZ,and Losartan daily.Takes Clonidine if needed  . Nocturia   . Peripheral neuropathy    takes Gabapentin daily  . Peripheral vascular disease (HCC)    legs blockage   . Pneumonia ?2015  . Restless leg   . Sleep apnea    uses Bipap  . Stroke [facility]) 2012   denies residual on 05/05/2016    Past Surgical History:  Procedure Laterality Date  . CARPAL TUNNEL RELEASE    . CATARACT EXTRACTION W/ INTRAOCULAR LENS  IMPLANT, BILATERAL Bilateral   . COLONOSCOPY WITH PROPOFOL N/A 03/09/2017   Procedure: COLONOSCOPY WITH PROPOFOL;  Surgeon: Wonda Horner, MD;  Location: [facility] ENDOSCOPY;  Service: Endoscopy;  Laterality: N/A;  . COLONOSCOPY WITH PROPOFOL N/A 04/03/2020   Procedure: COLONOSCOPY WITH PROPOFOL;  Surgeon: Wonda Horner, MD;  [facility] ENDOSCOPY;  Service: Endoscopy;  Laterality: N/A;  . I & D KNEE WITH POLY EXCHANGE Right 05/04/2016    IRRIGATION AND DEBRIDEMENT Right KNEE WITH UNICOMPARTMENTAL POLY EXCHANGE  . I & D KNEE WITH POLY EXCHANGE Right 05/04/2016   Procedure: IRRIGATION AND DEBRIDEMENT Right KNEE WITH UNICOMPARTMENTAL POLY EXCHANGE;  Surgeon: Renette Butters, MD;  Location: [place];  Service: Orthopedics;  Laterality: Right;  . JOINT REPLACEMENT    . LAPAROSCOPIC CHOLECYSTECTOMY  1995  . PARTIAL KNEE ARTHROPLASTY Right 04/06/2016   Procedure: UNICOMPARTMENTAL RIGHT KNEE;  Surgeon: Renette Butters, MD;  Location: [place];  Service: Orthopedics;  Laterality: Right;  . POLYPECTOMY  04/03/2020   Procedure: POLYPECTOMY;  Surgeon: Wonda Horner, MD;  [facility] ENDOS[facility];  Service: Endoscopy;;  . STRABISMUS SURGERY Bilateral 06/10/2017   Procedure: REPAIR STRABISMUS BILATERAL;  Surgeon: Everitt Amber, MD;  Location: [place];  Service: Ophthalmology;  Laterality: Bilateral;  . TONSILLECTOMY AND ADENOIDECTOMY    . TOTAL KNEE ARTHROPLASTY Left 2008  . TOTAL SHOULDER ARTHROPLASTY Left 03/04/2021   Procedure: Total Shoulder Arthroplasty;  Surgeon: [name], MD;  [facility];  Service: Orthopedics;  Laterality: Left;  . ULNAR NERVE TRANSPOSITION Right 1989    Family History  Problem Relation Age of Onset  . CVA Mother   . Liver cancer Father    Social History:  reports that he quit smoking about 9 years ago. His smoking use  included cigarettes. He has a 20.00 pack-year smoking history. He has quit using smokeless tobacco.  His smokeless tobacco use included chew. He reports current alcohol use. He reports that he does not use drugs.  Allergies:  Allergies  Allergen Reactions  . Penicillins Anaphylaxis  . Alfalfa   . Dymista [Azelastine-Fluticasone] Other (See Comments)    Burning sensation   . Flonase [Fluticasone Propionate] Other (See Comments)    burning  . Hydrocodone Other (See Comments)    SWEATING  . Penicillin G     Other reaction(s): anaph  . Warfarin And Related Other (See  Comments)    Pt states it makes his INR critical levels    No medications prior to admission.    Results for orders placed or performed during the hospital encounter of 04/02/21 (from the past 48 hour(s))  SARS CORONAVIRUS 2 (TAT 6-24 HRS) Nasopharyngeal Nasopharyngeal Swab     Status: None   Collection Time: 04/02/21 10:54 AM   Specimen: Nasopharyngeal Swab  Result Value Ref Range   SARS Coronavirus 2 NEGATIVE NEGATIVE    Comment: (NOTE) SARS-CoV-2 target nucleic acids are NOT DETECTED.  The SARS-CoV-2 RNA is generally detectable in upper and lower respiratory specimens during the acute phase of infection. Negative results do not preclude SARS-CoV-2 infection, do not rule out co-infections with other pathogens, and should not be used as the sole basis for treatment or other patient management decisions. Negative results must be combined with clinical observations, patient history, and epidemiological information. The expected result is Negative.  Fact Sheet for Patients: SugarRoll.be  Fact Sheet for Healthcare Providers: https://www.woods-mathews.com/  This test is not yet approved or cleared by the [place] FDA and  has been authorized for detection and/or diagnosis of SARS-CoV-2 by FDA under an Emergency Use Authorization (EUA). This EUA will remain  in effect (meaning this test can be used) for the duration of the COVID-19 declaration under Se ction 564(b)(1) of the Act, 21 U.S.C. section 360bbb-3(b)(1), unless the authorization is terminated or revoked sooner.  Performed at [facility], [place] [address] [place], [place] [address]    No results found.  Review of Systems  All other systems reviewed and are negative.   There were no vitals taken for this visit. Physical Exam  Patient is alert, oriented, no adenopathy, well-dressed, normal affect, normal respiratory effort. Examination patient has a palpable  dorsalis pedis and posterior tibial pulse that is irregular.  The Doppler was used and he has a biphasic dorsalis pedis and a triphasic posterior tibial pulse that is irregular consistent with his A. fib patient is on Xarelto.  He has redness and sausage digit swelling of the left second toe.  There is a distal ulcer.  Review of the radiographs shows destructive bony changes of the tuft of the second toe as well as MRI scan which shows bony changes consistent with either infection or AVN.  Patient's last hemoglobin A1c was 7.0.  Patient's radiographs also show periarticular cystic changes through the midfoot consistent with a chronic history of gout.Heart RRR Lungs Clear Assessment/Plan 1. Pain in left toe(s)   2. Osteomyelitis of second toe of left foot ([place])     Plan: We will plan for a left foot second ray amputation as outpatient surgery.  Risk and benefits were discussed including the importance of nonweightbearing.  Patient states he understands wished to proceed at this time.  We will obtain a uric acid level.   Bevely Palmer Idamay Hosein, PA  04/03/2021, 6:45 AM

## 2021-04-03 NOTE — Transfer of Care (Signed)
Immediate Anesthesia Transfer of Care Note  Patient: Willie Pineda  Procedure(s) Performed: LEFT 2ND RAY AMPUTATION (Left )  Patient Location: PACU  Anesthesia Type:MAC combined with regional for post-op pain  Level of Consciousness: awake, alert  and oriented  Airway & Oxygen Therapy: Patient Spontanous Breathing  Post-op Assessment: Report given to RN and Post -op Vital signs reviewed and stable  Post vital signs: Reviewed and stable  Last Vitals:  Vitals Value Taken Time  BP 113/75 04/03/21 1136  Temp    Pulse 51 04/03/21 1137  Resp 19 04/03/21 1137  SpO2 98 % 04/03/21 1137  Vitals shown include unvalidated device data.  Last Pain:  Vitals:   04/03/21 0841  TempSrc:   PainSc: 0-No pain         Complications: No complications documented.

## 2021-04-03 NOTE — Anesthesia Postprocedure Evaluation (Signed)
Anesthesia Post Note  Patient: Willie Pineda  Procedure(s) Performed: LEFT 2ND RAY AMPUTATION (Left )     Patient location during evaluation: PACU Anesthesia Type: MAC Level of consciousness: awake and alert Pain management: pain level controlled Vital Signs Assessment: post-procedure vital signs reviewed and stable Respiratory status: spontaneous breathing, nonlabored ventilation, respiratory function stable and patient connected to nasal cannula oxygen Cardiovascular status: stable and blood pressure returned to baseline Postop Assessment: no apparent nausea or vomiting Anesthetic complications: no   No complications documented.  Last Vitals:  Vitals:   04/03/21 1214 04/03/21 1227  BP: 138/85 126/72  Pulse: 81 (!) 49  Resp: 15 17  Temp:  (!) 36.2 C  SpO2: 96% 99%    Last Pain:  Vitals:   04/03/21 1208  TempSrc:   PainSc: 0-No pain                 [name] COKER

## 2021-04-04 ENCOUNTER — Encounter (HOSPITAL_COMMUNITY): Payer: Self-pay | Admitting: Orthopedic Surgery

## 2021-04-04 LAB — FUNGUS CULTURE WITH STAIN

## 2021-04-04 LAB — FUNGUS CULTURE RESULT

## 2021-04-04 LAB — FUNGAL ORGANISM REFLEX

## 2021-04-06 ENCOUNTER — Encounter: Payer: Medicare Other | Admitting: Rehabilitative and Restorative Service Providers"

## 2021-04-06 ENCOUNTER — Ambulatory Visit (INDEPENDENT_AMBULATORY_CARE_PROVIDER_SITE_OTHER): Payer: Medicare Other | Admitting: Physician Assistant

## 2021-04-06 DIAGNOSIS — M869 Osteomyelitis, unspecified: Secondary | ICD-10-CM

## 2021-04-06 NOTE — Progress Notes (Signed)
Office Visit Note   Patient: Willie Pineda           Date of Birth: Jan 25, 1946           MRN: [id number] Visit Date: 04/06/2021              Requested by: Josetta Huddle, MD [address] [place] [place],   [address] PCP: Josetta Huddle, MD  Chief Complaint  Patient presents with  . Left Foot - Follow-up      HPI Patient is here 3 days status post left second ray amputation.  He had significant bleeding through the dressing and is here for a dressing change.  Assessment & Plan: Visit Diagnoses: No diagnosis found.  Plan: May cleanse with antibacterial soap and water apply clean dressing follow-up in 1 week  Follow-Up Instructions: No follow-ups on file.   Ortho Exam  Patient is alert, oriented, no adenopathy, well-dressed, normal affect, normal respiratory effort. Examination of the foot swelling is well controlled he has well apposed wound edges without any dehiscence he has significant old bloody drainage on the old dressing just a small amount of blood drainage from the wound at this time.  No ascending cellulitis or signs of acute infection  Imaging: No results found. No images are attached to the encounter.  Labs: Lab Results  Component Value Date   HGBA1C 7.0 (H) 05/04/2016   ESRSEDRATE 28 01/03/2019   ESRSEDRATE 35 (H) 05/05/2016   CRP 4.9 (H) 03/05/2021   CRP 2.1 (H) 05/05/2016   LABURIC 9.5 (H) 04/01/2021   REPTSTATUS 03/11/2021 FINAL 03/04/2021   GRAMSTAIN  03/04/2021    RARE WBC PRESENT, PREDOMINANTLY PMN NO ORGANISMS SEEN Performed at [address] [place],  [address]    CULT RARE PROPIONIBACTERIUM ACNES 03/04/2021     Lab Results  Component Value Date   ALBUMIN 3.7 03/25/2016    No results found for: MG No results found for: VD25OH  No results found for: PREALBUMIN CBC EXTENDED Latest Ref Rng & Units 04/03/2021 03/06/2021 03/05/2021  WBC 4.0 - 10.5 K/uL 9.2 11.8(H) 12.7(H)  RBC 4.22 - 5.81 MIL/uL  4.12(L) 3.63(L) 3.84(L)  HGB 13.0 - 17.0 g/dL 13.3 11.7(L) 12.3(L)  HCT 39.0 - 52.0 % 40.5 36.7(L) 38.8(L)  PLT 150 - 400 K/uL 233 282 304  NEUTROABS 1.7 - 7.7 K/uL - 8.5(H) 9.4(H)  LYMPHSABS 0.7 - 4.0 K/uL - 2.0 2.3     There is no height or weight on file to calculate BMI.  Orders:  No orders of the defined types were placed in this encounter.  No orders of the defined types were placed in this encounter.    Procedures: No procedures performed  Clinical Data: No additional findings.  ROS:  All other systems negative, except as noted in the HPI. Review of Systems  Objective: Vital Signs: There were no vitals taken for this visit.  Specialty Comments:  No specialty comments available.  PMFS History: Patient Active Problem List   Diagnosis Date Noted  . Osteomyelitis of second toe of left foot ([place])   . Penicillin allergy   . Chronic osteomyelitis ([place])   . Diabetic foot infection ([place])   . S/p total knee replacement, bilateral   . Septic arthritis of shoulder, left ([place]) 03/03/2021  . Atrial fibrillation, chronic ([place]) 03/03/2021  . Chronic anticoagulation 03/03/2021  . DM (diabetes mellitus), type 2 with neurological complications ([place]) 38/09/1750  . Essential hypertension 03/03/2021  . Hypokalemia  03/03/2021  . Prolonged QT interval 03/03/2021  . COPD (chronic obstructive pulmonary disease) ([place]) 03/03/2021  . Chronic CHF (congestive heart failure) ([place]) 03/03/2021  . Diabetic peripheral neuropathy ([place]) 01/03/2019  . Obesity 06/01/2016  . Wound dehiscence 05/04/2016  . Knee osteoarthritis 04/06/2016   Past Medical History:  Diagnosis Date  . Arthritis    "hands primarily" (05/05/2016)  . Atrial fibrillation ([place])   . Back pain    age related  . CHF (congestive heart failure) ([place])   . Complication of anesthesia    Bronchial spasms  . COPD (chronic obstructive pulmonary disease) (H[facility])    inhalers as needed  . Depression    takes Zoloft daily  .  Diabetic peripheral neuropathy ([place]) 01/03/2019  . Dysrhythmia    A Fib-takes Xarelto daily  . Enlarged prostate    benign  . GERD (gastroesophageal reflux disease)    takes Omeprazole daily  . 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 Hyperlipidemia    takes Atorvastatin daily  . Hypertension    takes Metoprolol,HCTZ,and Losartan daily.Takes Clonidine if needed  . Nocturia   . Peripheral neuropathy    takes Gabapentin daily  . Peripheral vascular disease (HCC)    legs blockage   . Pneumonia ?2015  . Restless leg   . Sleep apnea    uses Bipap  . Stroke [facility]) 2012   denies residual on 05/05/2016    Family History  Problem Relation Age of Onset  . CVA Mother   . Liver cancer Father     Past Surgical History:  Procedure Laterality Date  . AMPUTATION Left 04/03/2021   Procedure: LEFT 2ND RAY AMPUTATION;  Surgeon: Newt Minion, MD;  Location: [place];  Service: Orthopedics;  Laterality: Left;  . CARPAL TUNNEL RELEASE    . CATARACT EXTRACTION W/ INTRAOCULAR LENS  IMPLANT, BILATERAL Bilateral   . COLONOSCOPY WITH PROPOFOL N/A 03/09/2017   Procedure: COLONOSCOPY WITH PROPOFOL;  Surgeon: Wonda Horner, MD;  Location: [facility] ENDOSCOPY;  Service: Endoscopy;  Laterality: N/A;  . COLONOSCOPY WITH PROPOFOL N/A 04/03/2020   Procedure: COLONOSCOPY WITH PROPOFOL;  Surgeon: Wonda Horner, MD;  [facility]OSCOPY;  Service: Endoscopy;  Laterality: N/A;  . I & D KNEE WITH POLY EXCHANGE Right 05/04/2016   IRRIGATION AND DEBRIDEMENT Right KNEE WITH UNICOMPARTMENTAL POLY EXCHANGE  . I & D KNEE WITH POLY EXCHANGE Right 05/04/2016   Procedure: IRRIGATION AND DEBRIDEMENT Right KNEE WITH UNICOMPARTMENTAL POLY EXCHANGE;  Surgeon: Renette Butters, MD;  Location: [place];  Service: Orthopedics;  Laterality: Right;  . JOINT REPLACEMENT    . LAPAROSCOPIC CHOLECYSTECTOMY  1995  . PARTIAL KNEE ARTHROPLASTY Right 04/06/2016   Procedure: UNICOMPARTMENTAL RIGHT KNEE;  Surgeon: Renette Butters, MD;  Location: [place];  Service: Orthopedics;  Laterality: Right;  . POLYPECTOMY  04/03/2020   Procedure: POLYPECTOMY;  Surgeon: Wonda Horner, MD;  [facility];  Service: Endoscopy;;  . STRABISMUS SURGERY Bilateral 06/10/2017   Procedure: REPAIR STRABISMUS BILATERAL;  Surgeon: Everitt Amber, MD;  Location: [place];  Service: Ophthalmology;  Laterality: Bilateral;  . TONSILLECTOMY AND ADENOIDECTOMY    . TOTAL KNEE ARTHROPLASTY Left 2008  . TOTAL SHOULDER ARTHROPLASTY Left 03/04/2021   Procedure: Total Shoulder Arthroplasty;  Surgeon: [name], MD;  [facility];  Service: Orthopedics;  Laterality: Left;  . ULNAR NERVE TRANSPOSITION Right 1989   Social History   Occupational  History  . Occupation: Retired Therapist, sports  Tobacco Use  . Smoking status: Former Smoker    Packs/day: 0.50    Years: 40.00    Pack years: 20.00    Types: Cigarettes    Quit date: 07/15/2011    Years since quitting: 9.7  . Smokeless tobacco: Former Systems developer    Types: Secondary school teacher  . Vaping Use: Never used  Substance and Sexual Activity  . Alcohol use: Yes    Comment: maybe 2 glases wine every other week  . Drug use: No  . Sexual activity: Yes

## 2021-04-07 ENCOUNTER — Encounter: Payer: Self-pay | Admitting: Orthopedic Surgery

## 2021-04-09 ENCOUNTER — Encounter: Payer: Medicare Other | Admitting: Rehabilitative and Restorative Service Providers"

## 2021-04-10 ENCOUNTER — Ambulatory Visit: Payer: Medicare Other | Admitting: Physician Assistant

## 2021-04-13 ENCOUNTER — Ambulatory Visit (INDEPENDENT_AMBULATORY_CARE_PROVIDER_SITE_OTHER): Payer: Medicare Other | Admitting: Physician Assistant

## 2021-04-13 ENCOUNTER — Encounter: Payer: Self-pay | Admitting: Orthopedic Surgery

## 2021-04-13 VITALS — Ht 72.0 in | Wt 267.0 lb

## 2021-04-13 DIAGNOSIS — M869 Osteomyelitis, unspecified: Secondary | ICD-10-CM

## 2021-04-13 NOTE — Progress Notes (Signed)
Office Visit Note   Patient: Willie Pineda           Date of Birth: Jul 01, 1946           MRN: [id number] Visit Date: 04/13/2021              Requested by: Josetta Huddle, MD [address] [address] [place],  [place] [address] PCP: Josetta Huddle, MD  Chief Complaint  Patient presents with  . Left Foot - Follow-up    Left second ray amputation 04/03/2021      HPI: The patient presents in follow-up today he is almost 2 weeks status post left second ray amputation.  He is almost 2 weeks status post left foot second ray amputation.  He admits he has been on his foot probably more than he should.  He is also been using full-strength hydrogen peroxide to clean his foot.  He says his foot gets red and swollen when he has it in a dependent position but improves significantly when he elevates his foot  Assessment & Plan: Visit Diagnoses: No diagnosis found.  Plan: Discussed that I do not want him using anything but Dial soap and water on his foot he should not use any hydrogen peroxide.  He should continue dry dressing changes.  I think he would do well going into a compression sock.  He has a pair of these but the size large is too small for his calf on the left.  I have recommended he go into a compression sock on the left extra-large. Follow-Up Instructions: No follow-ups on file.   Ortho Exam  Patient is alert, oriented, no adenopathy, well-dressed, normal affect, normal respiratory effort. Examination demonstrates some wound dehiscence with some erythema that is improved on elevation no ascending cellulitis he has some tips of sutures that are almost completely buried.  These 2 were harvested.  Remaining sutures will remain intact.  He does have some wound dehiscence that does not probe deeply there is surrounding macerated skin.  No foul odor no significant drainag  Imaging: No results found. No images are attached to the encounter.  Labs: Lab Results  Component Value Date    HGBA1C 7.0 (H) 05/04/2016   ESRSEDRATE 28 01/03/2019   ESRSEDRATE 35 (H) 05/05/2016   CRP 4.9 (H) 03/05/2021   CRP 2.1 (H) 05/05/2016   LABURIC 9.5 (H) 04/01/2021   REPTSTATUS 03/11/2021 FINAL 03/04/2021   GRAMSTAIN  03/04/2021    RARE WBC PRESENT, PREDOMINANTLY PMN NO ORGANISMS SEEN Performed at [facility] [address], [place], [place] [address]    CULT RARE PROPIONIBACTERIUM ACNES 03/04/2021     Lab Results  Component Value Date   ALBUMIN 3.7 03/25/2016    No results found for: MG No results found for: VD25OH  No results found for: PREALBUMIN CBC EXTENDED Latest Ref Rng & Units 04/03/2021 03/06/2021 03/05/2021  WBC 4.0 - 10.5 K/uL 9.2 11.8(H) 12.7(H)  RBC 4.22 - 5.81 MIL/uL 4.12(L) 3.63(L) 3.84(L)  HGB 13.0 - 17.0 g/dL 13.3 11.7(L) 12.3(L)  HCT 39.0 - 52.0 % 40.5 36.7(L) 38.8(L)  PLT 150 - 400 K/uL 233 282 304  NEUTROABS 1.7 - 7.7 K/uL - 8.5(H) 9.4(H)  LYMPHSABS 0.7 - 4.0 K/uL - 2.0 2.3     Body mass index is 36.21 kg/m.  Orders:  No orders of the defined types were placed in this encounter.  No orders of the defined types were placed in this encounter.    Procedures: No  procedures performed  Clinical Data: No additional findings.  ROS:  All other systems negative, except as noted in the HPI. Review of Systems  Objective: Vital Signs: Ht 6' (1.829 m)   Wt 267 lb (121.1 kg)   BMI 36.21 kg/m   Specialty Comments:  No specialty comments available.  PMFS History: Patient Active Problem List   Diagnosis Date Noted  . Osteomyelitis of second toe of left foot ([place])   . Penicillin allergy   . Chronic osteomyelitis ([place])   . Diabetic foot infection ([place])   . S/p total knee replacement, bilateral   . Septic arthritis of shoulder, left ([place]) 03/03/2021  . Atrial fibrillation, chronic ([place]) 03/03/2021  . Chronic anticoagulation 03/03/2021  . DM (diabetes mellitus), type 2 with neurological complications ([place]) 67/34/1937  . Essential  hypertension 03/03/2021  . Hypokalemia 03/03/2021  . Prolonged QT interval 03/03/2021  . COPD (chronic obstructive pulmonary disease) ([place]) 03/03/2021  . Chronic CHF (congestive heart failure) ([place]) 03/03/2021  . Diabetic peripheral neuropathy ([place]) 01/03/2019  . Obesity 06/01/2016  . Wound dehiscence 05/04/2016  . Knee osteoarthritis 04/06/2016   Past Medical History:  Diagnosis Date  . Arthritis    "hands primarily" (05/05/2016)  . Atrial fibrillation ([place])   . Back pain    age related  . CHF (congestive heart failure) ([place])   . Complication of anesthesia    Bronchial spasms  . COPD (chronic obstructive pulmonary disease) (H[facility])    inhalers as needed  . Depression    takes Zoloft daily  . Diabetic peripheral neuropathy ([place]) 01/03/2019  . Dysrhythmia    A Fib-takes Xarelto daily  . Enlarged prostate    benign  . GERD (gastroesophageal reflux disease)    takes Omeprazole daily  . 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 Hyperlipidemia    takes Atorvastatin daily  . Hypertension    takes Metoprolol,HCTZ,and Losartan daily.Takes Clonidine if needed  . Nocturia   . Peripheral neuropathy    takes Gabapentin daily  . Peripheral vascular disease (H[facility])    legs blockage   . Pneumonia ?2015  . Restless leg   . Sleep apnea    uses Bipap  . Stroke [facility]) 2012   denies residual on 05/05/2016    Family History  Problem Relation Age of Onset  . CVA Mother   . Liver cancer Father     Past Surgical History:  Procedure Laterality Date  . AMPUTATION Left 04/03/2021   Procedure: LEFT 2ND RAY AMPUTATION;  Surgeon: Newt Minion, MD;  Location: [place];  Service: Orthopedics;  Laterality: Left;  . CARPAL TUNNEL RELEASE    . CATARACT EXTRACTION W/ INTRAOCULAR LENS  IMPLANT, BILATERAL Bilateral   . COLONOSCOPY WITH PROPOFOL N/A 03/09/2017   Procedure: COLONOSCOPY WITH PROPOFOL;  Surgeon: Wonda Horner, MD;  Location: [facility] ENDOSCOPY;  Service: Endoscopy;   Laterality: N/A;  . COLONOSCOPY WITH PROPOFOL N/A 04/03/2020   Procedure: COLONOSCOPY WITH PROPOFOL;  Surgeon: Wonda Horner, MD;  [facility];  Service: Endoscopy;  Laterality: N/A;  . I & D KNEE WITH POLY EXCHANGE Right 05/04/2016   IRRIGATION AND DEBRIDEMENT Right KNEE WITH UNICOMPARTMENTAL POLY EXCHANGE  . I & D KNEE WITH POLY EXCHANGE Right 05/04/2016   Procedure: IRRIGATION AND DEBRIDEMENT Right KNEE WITH UNICOMPARTMENTAL POLY EXCHANGE;  Surgeon: Renette Butters, MD;  Location: [place];  Service: Orthopedics;  Laterality: Right;  . JOINT REPLACEMENT    .  LAPAROSCOPIC CHOLECYSTECTOMY  1995  . PARTIAL KNEE ARTHROPLASTY Right 04/06/2016   Procedure: UNICOMPARTMENTAL RIGHT KNEE;  Surgeon: Renette Butters, MD;  Location: [place];  Service: Orthopedics;  Laterality: Right;  . POLYPECTOMY  04/03/2020   Procedure: POLYPECTOMY;  Surgeon: Wonda Horner, MD;  [facility];  Service: Endoscopy;;  . STRABISMUS SURGERY Bilateral 06/10/2017   Procedure: REPAIR STRABISMUS BILATERAL;  Surgeon: Everitt Amber, MD;  Location: [place];  Service: Ophthalmology;  Laterality: Bilateral;  . TONSILLECTOMY AND ADENOIDECTOMY    . TOTAL KNEE ARTHROPLASTY Left 2008  . TOTAL SHOULDER ARTHROPLASTY Left 03/04/2021   Procedure: Total Shoulder Arthroplasty;  Surgeon: [name], MD;  [facility];  Service: Orthopedics;  Laterality: Left;  . ULNAR NERVE TRANSPOSITION Right 1989   Social History   Occupational History  . Occupation: Retired Therapist, sports  Tobacco Use  . Smoking status: Former Smoker    Packs/day: 0.50    Years: 40.00    Pack years: 20.00    Types: Cigarettes    Quit date: 07/15/2011    Years since quitting: 9.7  . Smokeless tobacco: Former Systems developer    Types: Secondary school teacher  . Vaping Use: Never used  Substance and Sexual Activity  . Alcohol use: Yes    Comment: maybe 2 glases wine every other week  . Drug use: No  . Sexual activity: Yes

## 2021-04-15 ENCOUNTER — Other Ambulatory Visit: Payer: Self-pay

## 2021-04-15 ENCOUNTER — Ambulatory Visit: Payer: Medicare Other | Admitting: Infectious Disease

## 2021-04-15 ENCOUNTER — Encounter: Payer: Self-pay | Admitting: Infectious Disease

## 2021-04-15 VITALS — BP 125/71 | HR 93 | Temp 98.0°F | Wt 276.0 lb

## 2021-04-15 DIAGNOSIS — M869 Osteomyelitis, unspecified: Secondary | ICD-10-CM

## 2021-04-15 DIAGNOSIS — Z96653 Presence of artificial knee joint, bilateral: Secondary | ICD-10-CM | POA: Diagnosis not present

## 2021-04-15 DIAGNOSIS — Z7901 Long term (current) use of anticoagulants: Secondary | ICD-10-CM | POA: Diagnosis not present

## 2021-04-15 DIAGNOSIS — M8660 Other chronic osteomyelitis, unspecified site: Secondary | ICD-10-CM | POA: Diagnosis not present

## 2021-04-15 DIAGNOSIS — L089 Local infection of the skin and subcutaneous tissue, unspecified: Secondary | ICD-10-CM

## 2021-04-15 DIAGNOSIS — M009 Pyogenic arthritis, unspecified: Secondary | ICD-10-CM

## 2021-04-15 DIAGNOSIS — E11628 Type 2 diabetes mellitus with other skin complications: Secondary | ICD-10-CM | POA: Diagnosis not present

## 2021-04-15 DIAGNOSIS — I482 Chronic atrial fibrillation, unspecified: Secondary | ICD-10-CM | POA: Diagnosis not present

## 2021-04-15 DIAGNOSIS — E1142 Type 2 diabetes mellitus with diabetic polyneuropathy: Secondary | ICD-10-CM | POA: Diagnosis not present

## 2021-04-15 MED ORDER — DOXYCYCLINE HYCLATE 100 MG PO TABS: 100.0000 mg | ORAL_TABLET | Freq: Two times a day (BID) | ORAL | 5 refills | Status: DC

## 2021-04-15 NOTE — Progress Notes (Signed)
Subjective:  Chief complaint: Worsening left-sided shoulder pain in severity increased from 2-3 of 10 to 9 out of 10   Patient ID: Willie Pineda, male    DOB: 24-Oct-1946, 75 y.o.   MRN: [id number]  HPI  Willie Pineda a 75 -year-old Caucasian man who is a retired Marine scientist and also former Writer who has a history of atrial fibrillation hypertension hyperlipidemia nonischemic cardiomyopathy peripheral vascular disease COPD diabetes mellitus and prior right sided prosthetic knee infection (treated by my partners Dr. Baxter Flattery and Dr. Linus Salmons in 2017), who is being prepared for possible shoulder replacement surgery with Dr. Griffin Basil when a CT and MRI scan were done which showed a septic shoulder and abscess.  MRI specifically showed a large complex joint effusion with enhancing synovitis with high-grade partial-thickness cartilage loss of the glenohumeral joint subchondral bone marrow edema in the inferior aspect of the glenoid likely to osteomyelitis, tear of the supraspinatus infraspinatus tendons complete tear of the subscapularis tendon and dislocation long head of biceps tendon and severe synovitis of the long head of the biceps tendon which also thought to possibly represent intra-articular infection.  Of note he had injured this shoulder when he had fallen from his horse a year ago in December and struck the ground quite hard but without clear breaks in the skin but with extensive bruising.  He also told me that when he was younger he had a horse pull him through backwards and injured the same shoulder.  The patient had been seen by March 17 where he had developed a soft tissue infection which was debrided in the podiatry office.  He was given 10 days of doxycycline which he was on during admission to the hospital.  Note this was also a site of prior injury where horse and stepped on his toe.  Taken to the operating room by Dr. Griffin Basil where he underwent left shoulder I&D with debridement of abscess and  also removal of bone from an area of osteomyelitis involving the humeral head incision debridement of open septic shoulder synovectomy I&D of the [facility] joint with resection of part of the distal clavicle where there was osteomyelitis and placement of a left shoulder antibiotic spacer hemiarthroplasty.  Multiple cultures were done including for organisms that are more subacute in presentation such as nontuberculous back mycobacteria and fungi.  He was initially on vancomycin and aztreonam and metronidazole but later discharged on oral doxycycline and levofloxacin he has subsequently grown Propionibacterium acnes on multiple cultures.  We narrowed him to doxycycline.  Shoulder pain is improved.  His footwas appearing stable but he had not yet seen podiatry.  I obtain plain films which showed bony resorptive change in the distal phalanx of second digit concerning for osteomyelitis.  He also had a regular arthropathy of the TMT joints.  Patient was eval by Meridee Score and underwent left second ray amputation.  No cultures were taken.  He had been doing relatively well though he had some problem with bleeding at the operative site.  He is being followed very closely by orthopedic surgery.  Since I last saw him he developed significant left-sided shoulder pain that was new in nature before he was experiencing more of a dull pain that he rated about 3 out of 10 in severity exacerbated by movement.  Last Thursday developed more of a sharp pain that he grades it about 8-9 out of 10 in severity and is located more anteriorly and near his biceps tendon.  He also  has some tenderness at this site as well.  He is hopeful that is related to some musculoskeletal injury related to his rehabilitation but he is also anxious about whether this might represent recurrence of infection.  He asked if we could image this arm with an MRI and I am agreeable to doing so given his increased pain in particular.   Past  Medical History:  Diagnosis Date  . Arthritis    "hands primarily" (05/05/2016)  . Atrial fibrillation ([place])   . Back pain    age related  . CHF (congestive heart failure) ([place])   . Complication of anesthesia    Bronchial spasms  . COPD (chronic obstructive pulmonary disease) (HCC)    inhalers as needed  . Depression    takes Zoloft daily  . Diabetic peripheral neuropathy ([place]) 01/03/2019  . Dysrhythmia    A Fib-takes Xarelto daily  . Enlarged prostate    benign  . GERD (gastroesophageal reflux disease)    takes Omeprazole daily  . 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 Hyperlipidemia    takes Atorvastatin daily  . Hypertension    takes Metoprolol,HCTZ,and Losartan daily.Takes Clonidine if needed  . Nocturia   . Peripheral neuropathy    takes Gabapentin daily  . Peripheral vascular disease (HCC)    legs blockage   . Pneumonia ?2015  . Restless leg   . Sleep apnea    uses Bipap  . Stroke [facility]) 2012   denies residual on 05/05/2016    Past Surgical History:  Procedure Laterality Date  . AMPUTATION Left 04/03/2021   Procedure: LEFT 2ND RAY AMPUTATION;  Surgeon: Newt Minion, MD;  Location: [place];  Service: Orthopedics;  Laterality: Left;  . CARPAL TUNNEL RELEASE    . CATARACT EXTRACTION W/ INTRAOCULAR LENS  IMPLANT, BILATERAL Bilateral   . COLONOSCOPY WITH PROPOFOL N/A 03/09/2017   Procedure: COLONOSCOPY WITH PROPOFOL;  Surgeon: Wonda Horner, MD;  Location: [facility] ENDOSCOPY;  Service: Endoscopy;  Laterality: N/A;  . COLONOSCOPY WITH PROPOFOL N/A 04/03/2020   Procedure: COLONOSCOPY WITH PROPOFOL;  Surgeon: Wonda Horner, MD;  [facility];  Service: Endoscopy;  Laterality: N/A;  . I & D KNEE WITH POLY EXCHANGE Right 05/04/2016   IRRIGATION AND DEBRIDEMENT Right KNEE WITH UNICOMPARTMENTAL POLY EXCHANGE  . I & D KNEE WITH POLY EXCHANGE Right 05/04/2016   Procedure: IRRIGATION AND DEBRIDEMENT Right KNEE WITH UNICOMPARTMENTAL POLY  EXCHANGE;  Surgeon: Renette Butters, MD;  Location: [place];  Service: Orthopedics;  Laterality: Right;  . JOINT REPLACEMENT    . LAPAROSCOPIC CHOLECYSTECTOMY  1995  . PARTIAL KNEE ARTHROPLASTY Right 04/06/2016   Procedure: UNICOMPARTMENTAL RIGHT KNEE;  Surgeon: Renette Butters, MD;  Location: [place];  Service: Orthopedics;  Laterality: Right;  . POLYPECTOMY  04/03/2020   Procedure: POLYPECTOMY;  Surgeon: Wonda Horner, MD;  [facility];  Service: Endoscopy;;  . STRABISMUS SURGERY Bilateral 06/10/2017   Procedure: REPAIR STRABISMUS BILATERAL;  Surgeon: Everitt Amber, MD;  Location: [place];  Service: Ophthalmology;  Laterality: Bilateral;  . TONSILLECTOMY AND ADENOIDECTOMY    . TOTAL KNEE ARTHROPLASTY Left 2008  . TOTAL SHOULDER ARTHROPLASTY Left 03/04/2021   Procedure: Total Shoulder Arthroplasty;  Surgeon: [name], MD;  [facility];  Service: Orthopedics;  Laterality: Left;  . ULNAR NERVE TRANSPOSITION Right 1989    Family History  Problem Relation Age of Onset  . CVA  Mother   . Liver cancer Father       Social History   Socioeconomic History  . Marital status: Married    Spouse name: Not on file  . Number of children: Not on file  . Years of education: Not on file  . Highest education level: Not on file  Occupational History  . Occupation: Retired Therapist, sports  Tobacco Use  . Smoking status: Former Smoker    Packs/day: 0.50    Years: 40.00    Pack years: 20.00    Types: Cigarettes    Quit date: 07/15/2011    Years since quitting: 9.7  . Smokeless tobacco: Former Systems developer    Types: Secondary school teacher  . Vaping Use: Never used  Substance and Sexual Activity  . Alcohol use: Yes    Comment: maybe 2 glases wine every other week  . Drug use: No  . Sexual activity: Yes  Other Topics Concern  . Not on file  Social History Narrative   Lives at home with spouse    Caffeine 2 cups of coffee daily    Right handed.    Social Determinants of Health    Financial Resource Strain: Not on file  Food Insecurity: Not on file  Transportation Needs: Not on file  Physical Activity: Not on file  Stress: Not on file  Social Connections: Not on file    Allergies  Allergen Reactions  . Penicillins Anaphylaxis  . Alfalfa   . Dymista [Azelastine-Fluticasone] Other (See Comments)    Burning sensation   . Flonase [Fluticasone Propionate] Other (See Comments)    burning  . Hydrocodone Other (See Comments)    SWEATING  . Penicillin G     Other reaction(s): anaph  . Warfarin And Related Other (See Comments)    Pt states it makes his INR critical levels     Current Outpatient Medications:  .  acetaminophen (TYLENOL) 500 MG tablet, Take 2,000 mg by mouth every 6 (six) hours as needed for mild pain or moderate pain., Disp: , Rfl:  .  allopurinol (ZYLOPRIM) 100 MG tablet, Take 1 tablet (100 mg total) by mouth 2 (two) times daily., Disp: 60 tablet, Rfl: 12 .  atorvastatin (LIPITOR) 10 MG tablet, Take 10 mg by mouth daily., Disp: , Rfl: 6 .  carboxymethylcellulose (REFRESH PLUS) 0.5 % SOLN, 1 drop 3 (three) times daily as needed., Disp: , Rfl:  .  cloNIDine (CATAPRES) 0.1 MG tablet, Take 0.1 mg by mouth daily as needed (Only takes if pt if bp  > 160)., Disp: , Rfl:  .  colchicine 0.6 MG tablet, Take 1 tablet (0.6 mg total) by mouth daily. As needed for gout pain., Disp: 60 tablet, Rfl: 12 .  Cyanocobalamin (VITAMIN B 12) 500 MCG TABS, Take by mouth., Disp: , Rfl:  .  cyclobenzaprine (FLEXERIL) 10 MG tablet, Take 10 mg by mouth at bedtime as needed for muscle spasms. , Disp: , Rfl:  .  diclofenac Sodium (VOLTAREN) 1 % GEL, Apply 2 g topically daily as needed (pain)., Disp: , Rfl:  .  doxycycline (VIBRA-TABS) 100 MG tablet, Take 1 tablet (100 mg total) by mouth 2 (two) times daily. End date 04/15/2021, Disp: 60 tablet, Rfl: 5 .  ENTRESTO 24-26 MG, Take 1 tablet by mouth 2 (two) times daily., Disp: , Rfl:  .  gabapentin (NEURONTIN) 300 MG capsule, Take  300 mg by mouth 2 (two) times daily., Disp: , Rfl:  .  metolazone (ZAROXOLYN) 2.5 MG tablet,  Take 2.5 mg by mouth daily as needed (fluid)., Disp: , Rfl:  .  metoprolol succinate (TOPROL-XL) 50 MG 24 hr tablet, Take 50 mg by mouth daily. Take with or immediately following a meal., Disp: , Rfl:  .  omeprazole (PRILOSEC) 20 MG capsule, Take 20 mg by mouth daily., Disp: , Rfl:  .  oxyCODONE-acetaminophen (PERCOCET) 5-325 MG tablet, Take 1 tablet by mouth every 4 (four) hours as needed., Disp: 30 tablet, Rfl: 0 .  Polyethyl Glycol-Propyl Glycol (SYSTANE FREE OP), Place 1 drop into both eyes daily., Disp: , Rfl:  .  potassium chloride (MICRO-K) 10 MEQ CR capsule, Take 10 mEq by mouth daily., Disp: , Rfl:  .  rivaroxaban (XARELTO) 20 MG TABS tablet, Take 20 mg by mouth daily., Disp: , Rfl:  .  Semaglutide,0.25 or 0.5MG /DOS, (OZEMPIC, 0.25 OR 0.5 MG/DOSE,) 2 MG/1.5ML SOPN, Inject 1 mg into the skin once a week., Disp: , Rfl:  .  sertraline (ZOLOFT) 100 MG tablet, Take 100 mg by mouth daily., Disp: , Rfl:  .  spironolactone (ALDACTONE) 25 MG tablet, Take 25 mg by mouth daily., Disp: , Rfl:  .  torsemide (DEMADEX) 100 MG tablet, Take 100 mg by mouth daily., Disp: , Rfl:  .  traMADol (ULTRAM) 50 MG tablet, Take 50-100 mg by mouth every 8 (eight) hours as needed., Disp: , Rfl:     Review of Systems  Constitutional: Negative for activity change, appetite change, chills, diaphoresis, fatigue, fever and unexpected weight change.  HENT: Negative for congestion, rhinorrhea, sinus pressure, sneezing, sore throat and trouble swallowing.   Eyes: Negative for photophobia and visual disturbance.  Respiratory: Negative for cough, chest tightness, shortness of breath, wheezing and stridor.   Cardiovascular: Negative for chest pain, palpitations and leg swelling.  Gastrointestinal: Negative for abdominal distention, abdominal pain, anal bleeding, blood in stool, constipation, diarrhea, nausea and vomiting.   Genitourinary: Negative for difficulty urinating, dysuria, flank pain and hematuria.  Musculoskeletal: Positive for arthralgias and myalgias. Negative for back pain, gait problem and joint swelling.  Skin: Positive for wound. Negative for color change, pallor and rash.  Neurological: Negative for dizziness, tremors, weakness and light-headedness.  Hematological: Negative for adenopathy. Does not bruise/bleed easily.  Psychiatric/Behavioral: Negative for agitation, behavioral problems, confusion, decreased concentration, dysphoric mood and sleep disturbance.       Objective:   Physical Exam Constitutional:      Appearance: He is well-developed.  HENT:     Head: Normocephalic and atraumatic.  Eyes:     Extraocular Movements: Extraocular movements intact.     Conjunctiva/sclera: Conjunctivae normal.  Cardiovascular:     Rate and Rhythm: Normal rate and regular rhythm.  Pulmonary:     Effort: Pulmonary effort is normal. No respiratory distress.     Breath sounds: No wheezing.  Abdominal:     General: There is no distension.     Palpations: Abdomen is soft.  Musculoskeletal:        General: Normal range of motion.     Left shoulder: Tenderness present.     Cervical back: Normal range of motion and neck supple.  Skin:    General: Skin is warm and dry.     Coloration: Skin is not pale.     Findings: No erythema or rash.  Neurological:     General: No focal deficit present.     Mental Status: He is alert and oriented to person, place, and time.  Psychiatric:        Mood  and Affect: Mood normal.        Behavior: Behavior normal.        Thought Content: Thought content normal.        Judgment: Judgment normal.     Left shoulder in sling.  He has an area of tenderness anteriorly near insertion of biceps tendon   Left foot pictured below March 12, 2021:     Left foot after surgery pictured Apr 15, 2021:            Assessment & Plan:  Left septic shoulder with also  osteomyelitis involving the clavicle and humerus: Propionibacterium is isolated  I am worried about his increased pain and we will check his inflammatory markers again.  I also ordered an MRI which he is requesting with caution about its interpretation given these had recent surgery.  I will also reach out to Dr. Griffin Basil   Diabetic foot infection with osteomyelitis now status post second ray amputation.  Chronic atrial fibrillation: He is going to see an electrophysiologist with consideration of pacemaker placement he asked if his current infections could impact placement of pacemaker.  I feel if his infections are controlled then pacemaker could be safely placed.  I also cautioned him once he has a pacemaker his vigilance regarding infection will need to be increased even more and he will need to continue to monitor for the current known infections and potential future infection including potential future diabetic foot infections and potential odontogenic infections, with need to with for regular dental follow-up  Former prosthetic joint infection on the right: Status post treatment there is no specific organism    History of penicillin allergy he is started tolerated cephalexin to cephalosporins would certainly be an option he was not tested in the hospital with skin testing or amoxicillin challenge.  I spent greater than 40 minutes with the patient including greater than 50% of time in face to face counsel of the patient regarding the nature of septic shoulders, osteomyelitis, diabetic foot infections with osteomyelitis the pathogen involved, nature of pacemakers and risk for infection associated with him being in place and in coordination of  His  care.

## 2021-04-17 LAB — CBC WITH DIFFERENTIAL/PLATELET
Absolute Monocytes: 712 cells/uL (ref 200–950)
Basophils Absolute: 113 cells/uL (ref 0–200)
Basophils Relative: 1 %
Eosinophils Absolute: 124 cells/uL (ref 15–500)
Eosinophils Relative: 1.1 %
HCT: 42.2 % (ref 38.5–50.0)
Hemoglobin: 14.3 g/dL (ref 13.2–17.1)
Lymphs Abs: 2418 cells/uL (ref 850–3900)
MCH: 32.1 pg (ref 27.0–33.0)
MCHC: 33.9 g/dL (ref 32.0–36.0)
MCV: 94.8 fL (ref 80.0–100.0)
MPV: 9 fL (ref 7.5–12.5)
Monocytes Relative: 6.3 %
Neutro Abs: 7933 cells/uL — ABNORMAL HIGH (ref 1500–7800)
Neutrophils Relative %: 70.2 %
Platelets: 287 10*3/uL (ref 140–400)
RBC: 4.45 10*6/uL (ref 4.20–5.80)
RDW: 13.6 % (ref 11.0–15.0)
Total Lymphocyte: 21.4 %
WBC: 11.3 10*3/uL — ABNORMAL HIGH (ref 3.8–10.8)

## 2021-04-17 LAB — ACID FAST CULTURE WITH REFLEXED SENSITIVITIES (MYCOBACTERIA)
Acid Fast Culture: NEGATIVE
Acid Fast Culture: NEGATIVE
Acid Fast Culture: NEGATIVE
Acid Fast Culture: NEGATIVE

## 2021-04-17 LAB — COMPLETE METABOLIC PANEL WITH GFR
AG Ratio: 1.2 (calc) (ref 1.0–2.5)
ALT: 9 U/L (ref 9–46)
AST: 15 U/L (ref 10–35)
Albumin: 3.8 g/dL (ref 3.6–5.1)
Alkaline phosphatase (APISO): 101 U/L (ref 35–144)
BUN: 23 mg/dL (ref 7–25)
CO2: 22 mmol/L (ref 20–32)
Calcium: 9.8 mg/dL (ref 8.6–10.3)
Chloride: 100 mmol/L (ref 98–110)
Creat: 0.93 mg/dL (ref 0.70–1.18)
GFR, Est African American: 93 mL/min/{1.73_m2} (ref >=60)
GFR, Est Non African American: 80 mL/min/{1.73_m2} (ref >=60)
Globulin: 3.1 g/dL (calc) (ref 1.9–3.7)
Glucose, Bld: 136 mg/dL — ABNORMAL HIGH (ref 65–99)
Potassium: 3.6 mmol/L (ref 3.5–5.3)
Sodium: 140 mmol/L (ref 135–146)
Total Bilirubin: 1 mg/dL (ref 0.2–1.2)
Total Protein: 6.9 g/dL (ref 6.1–8.1)

## 2021-04-17 LAB — SEDIMENTATION RATE: Sed Rate: 51 mm/h — ABNORMAL HIGH (ref 0–20)

## 2021-04-17 LAB — C-REACTIVE PROTEIN: CRP: 42.3 mg/L — ABNORMAL HIGH (ref <8.0)

## 2021-04-20 ENCOUNTER — Other Ambulatory Visit: Payer: Self-pay

## 2021-04-20 ENCOUNTER — Ambulatory Visit (INDEPENDENT_AMBULATORY_CARE_PROVIDER_SITE_OTHER): Payer: Medicare Other

## 2021-04-20 DIAGNOSIS — M25512 Pain in left shoulder: Secondary | ICD-10-CM | POA: Diagnosis not present

## 2021-04-20 DIAGNOSIS — S46012A Strain of muscle(s) and tendon(s) of the rotator cuff of left shoulder, initial encounter: Secondary | ICD-10-CM | POA: Diagnosis not present

## 2021-04-20 DIAGNOSIS — A419 Sepsis, unspecified organism: Secondary | ICD-10-CM | POA: Diagnosis not present

## 2021-04-20 DIAGNOSIS — S46112A Strain of muscle, fascia and tendon of long head of biceps, left arm, initial encounter: Secondary | ICD-10-CM | POA: Diagnosis not present

## 2021-04-20 DIAGNOSIS — M8660 Other chronic osteomyelitis, unspecified site: Secondary | ICD-10-CM

## 2021-04-20 DIAGNOSIS — M6258 Muscle wasting and atrophy, not elsewhere classified, other site: Secondary | ICD-10-CM | POA: Diagnosis not present

## 2021-04-20 MED ORDER — GADOBUTROL 1 MMOL/ML IV SOLN
10.0000 mL | Freq: Once | INTRAVENOUS | Status: AC | PRN
  Administered 2021-04-20: 10 mL via INTRAVENOUS

## 2021-04-21 ENCOUNTER — Encounter: Payer: Self-pay | Admitting: Physician Assistant

## 2021-04-21 ENCOUNTER — Telehealth: Payer: Self-pay | Admitting: *Deleted

## 2021-04-21 ENCOUNTER — Ambulatory Visit (INDEPENDENT_AMBULATORY_CARE_PROVIDER_SITE_OTHER): Payer: Medicare Other | Admitting: Physician Assistant

## 2021-04-21 DIAGNOSIS — M869 Osteomyelitis, unspecified: Secondary | ICD-10-CM

## 2021-04-21 MED ORDER — NITROGLYCERIN 0.2 MG/HR TD PT24: 0.2000 mg | MEDICATED_PATCH | Freq: Every day | TRANSDERMAL | 12 refills | Status: AC

## 2021-04-21 NOTE — Progress Notes (Signed)
Office Visit Note   Patient: Willie Pineda           Date of Birth: [birth year]           MRN: [id number] Visit Date: 04/21/2021              Requested by: Josetta Huddle, MD [address] [place],  [place] [address] PCP: Josetta Huddle, MD  Chief Complaint  Patient presents with  . Left Foot - Follow-up      HPI: Patient is a pleasant 75 year old gentleman who is little over 2 weeks status post left second ray amputation.  He is wearing a fairly tight fitting shoe today.  He is wearing his compression sock  Assessment & Plan: Visit Diagnoses: No diagnosis found.  Plan: Have asked that he go back into a postop shoe as this would be more supportive while the wound is healing we will also had a nitroglycerin patch applied to the foot daily.  If he gets a headache he can cut this in half.  He does not want to do Trental because of concerns of it interfering with his anticoagulant  Follow-Up Instructions: No follow-ups on file.   Ortho Exam  Patient is alert, oriented, no adenopathy, well-dressed, normal affect, normal respiratory effort. Examination he does have some dried and macerated skin across the webspace.  He has superficial wound dehiscence that does not probe deeply.  1 suture was not tied it was removed.  There is no cellulitis no foul odor very little drainage no signs of acute infection  Imaging: No results found. No images are attached to the encounter.  Labs: Lab Results  Component Value Date   HGBA1C 7.0 (H) 05/04/2016   ESRSEDRATE 51 (H) 04/15/2021   ESRSEDRATE 28 01/03/2019   ESRSEDRATE 35 (H) 05/05/2016   CRP 42.3 (H) 04/15/2021   CRP 4.9 (H) 03/05/2021   CRP 2.1 (H) 05/05/2016   LABURIC 9.5 (H) 04/01/2021   REPTSTATUS 03/11/2021 FINAL 03/04/2021   GRAMSTAIN  03/04/2021    RARE WBC PRESENT, PREDOMINANTLY PMN NO ORGANISMS SEEN Performed at [facility] [address], [place], [place] [address]    CULT RARE PROPIONIBACTERIUM  ACNES 03/04/2021     Lab Results  Component Value Date   ALBUMIN 3.7 03/25/2016    No results found for: MG No results found for: VD25OH  No results found for: PREALBUMIN CBC EXTENDED Latest Ref Rng & Units 04/15/2021 04/03/2021 03/06/2021  WBC 3.8 - 10.8 Thousand/uL 11.3(H) 9.2 11.8(H)  RBC 4.20 - 5.80 Million/uL 4.45 4.12(L) 3.63(L)  HGB 13.2 - 17.1 g/dL 14.3 13.3 11.7(L)  HCT 38.5 - 50.0 % 42.2 40.5 36.7(L)  PLT 140 - 400 Thousand/uL 287 233 282  NEUTROABS 1,500 - 7,800 cells/uL 7,933(H) - 8.5(H)  LYMPHSABS 850 - 3,900 cells/uL 2,418 - 2.0     There is no height or weight on file to calculate BMI.  Orders:  No orders of the defined types were placed in this encounter.  Meds ordered this encounter  Medications  . nitroGLYCERIN (NITRODUR - DOSED IN MG/24 HR) 0.2 mg/hr patch    Sig: Place 1 patch (0.2 mg total) onto the skin daily.    Dispense:  30 patch    Refill:  12     Procedures: No procedures performed  Clinical Data: No additional findings.  ROS:  All other systems negative, except as noted in the HPI. Review of Systems  Objective: Vital Signs: There were no  vitals taken for this visit.  Specialty Comments:  No specialty comments available.  PMFS History: Patient Active Problem List   Diagnosis Date Noted  . Osteomyelitis of second toe of left foot ([place])   . Penicillin allergy   . Chronic osteomyelitis ([place])   . Diabetic foot infection ([place])   . S/p total knee replacement, bilateral   . Septic arthritis of shoulder, left ([place]) 03/03/2021  . Atrial fibrillation, chronic ([place]) 03/03/2021  . Chronic anticoagulation 03/03/2021  . DM (diabetes mellitus), type 2 with neurological complications ([place]) 49/67/5916  . Essential hypertension 03/03/2021  . Hypokalemia 03/03/2021  . Prolonged QT interval 03/03/2021  . COPD (chronic obstructive pulmonary disease) ([place]) 03/03/2021  . Chronic CHF (congestive heart failure) ([place]) 03/03/2021  . Diabetic  peripheral neuropathy ([place]) 01/03/2019  . Obesity 06/01/2016  . Wound dehiscence 05/04/2016  . Knee osteoarthritis 04/06/2016   Past Medical History:  Diagnosis Date  . Arthritis    "hands primarily" (05/05/2016)  . Atrial fibrillation ([place])   . Back pain    age related  . CHF (congestive heart failure) ([place])   . Complication of anesthesia    Bronchial spasms  . COPD (chronic obstructive pulmonary disease) (H[facility])    inhalers as needed  . Depression    takes Zoloft daily  . Diabetic peripheral neuropathy ([place]) 01/03/2019  . Dysrhythmia    A Fib-takes Xarelto daily  . Enlarged prostate    benign  . GERD (gastroesophageal reflux disease)    takes Omeprazole daily  . 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 Hyperlipidemia    takes Atorvastatin daily  . Hypertension    takes Metoprolol,HCTZ,and Losartan daily.Takes Clonidine if needed  . Nocturia   . Peripheral neuropathy    takes Gabapentin daily  . Peripheral vascular disease (HCC)    legs blockage   . Pneumonia ?2015  . Restless leg   . Sleep apnea    uses Bipap  . Stroke [facility]) 2012   denies residual on 05/05/2016    Family History  Problem Relation Age of Onset  . CVA Mother   . Liver cancer Father     Past Surgical History:  Procedure Laterality Date  . AMPUTATION Left 04/03/2021   Procedure: LEFT 2ND RAY AMPUTATION;  Surgeon: Newt Minion, MD;  Location: [place];  Service: Orthopedics;  Laterality: Left;  . CARPAL TUNNEL RELEASE    . CATARACT EXTRACTION W/ INTRAOCULAR LENS  IMPLANT, BILATERAL Bilateral   . COLONOSCOPY WITH PROPOFOL N/A 03/09/2017   Procedure: COLONOSCOPY WITH PROPOFOL;  Surgeon: Wonda Horner, MD;  Location: [facility] ENDOSCOPY;  Service: Endoscopy;  Laterality: N/A;  . COLONOSCOPY WITH PROPOFOL N/A 04/03/2020   Procedure: COLONOSCOPY WITH PROPOFOL;  Surgeon: Wonda Horner, MD;  [facility];  Service: Endoscopy;  Laterality: N/A;  . I & D KNEE WITH POLY EXCHANGE  Right 05/04/2016   IRRIGATION AND DEBRIDEMENT Right KNEE WITH UNICOMPARTMENTAL POLY EXCHANGE  . I & D KNEE WITH POLY EXCHANGE Right 05/04/2016   Procedure: IRRIGATION AND DEBRIDEMENT Right KNEE WITH UNICOMPARTMENTAL POLY EXCHANGE;  Surgeon: Renette Butters, MD;  Location: [place];  Service: Orthopedics;  Laterality: Right;  . JOINT REPLACEMENT    . LAPAROSCOPIC CHOLECYSTECTOMY  1995  . PARTIAL KNEE ARTHROPLASTY Right 04/06/2016   Procedure: UNICOMPARTMENTAL RIGHT KNEE;  Surgeon: Renette Butters, MD;  Location: [place];  Service: Orthopedics;  Laterality: Right;  . POLYPECTOMY  04/03/2020  Procedure: POLYPECTOMY;  Surgeon: Wonda Horner, MD;  [facility];  Service: Endoscopy;;  . STRABISMUS SURGERY Bilateral 06/10/2017   Procedure: REPAIR STRABISMUS BILATERAL;  Surgeon: Everitt Amber, MD;  Location: [place];  Service: Ophthalmology;  Laterality: Bilateral;  . TONSILLECTOMY AND ADENOIDECTOMY    . TOTAL KNEE ARTHROPLASTY Left 2008  . TOTAL SHOULDER ARTHROPLASTY Left 03/04/2021   Procedure: Total Shoulder Arthroplasty;  Surgeon: [name], MD;  [facility];  Service: Orthopedics;  Laterality: Left;  . ULNAR NERVE TRANSPOSITION Right 1989   Social History   Occupational History  . Occupation: Retired Therapist, sports  Tobacco Use  . Smoking status: Former Smoker    Packs/day: 0.50    Years: 40.00    Pack years: 20.00    Types: Cigarettes    Quit date: 07/15/2011    Years since quitting: 9.7  . Smokeless tobacco: Former Systems developer    Types: Secondary school teacher  . Vaping Use: Never used  Substance and Sexual Activity  . Alcohol use: Yes    Comment: maybe 2 glases wine every other week  . Drug use: No  . Sexual activity: Yes

## 2021-04-21 NOTE — Telephone Encounter (Signed)
Truman Hayward at [facility] calling to let Dr Tommy Medal know that the MRI has been read and report is available.  Landis Gandy, RN

## 2021-04-23 DIAGNOSIS — M19012 Primary osteoarthritis, left shoulder: Secondary | ICD-10-CM | POA: Diagnosis not present

## 2021-04-23 DIAGNOSIS — M25512 Pain in left shoulder: Secondary | ICD-10-CM | POA: Diagnosis not present

## 2021-04-24 NOTE — Patient Instructions (Addendum)
DUE TO COVID-19 ONLY ONE VISITOR IS ALLOWED TO COME WITH YOU AND STAY IN THE WAITING ROOM ONLY DURING PRE OP AND PROCEDURE DAY OF SURGERY. THE 1 VISITOR  MAY VISIT WITH YOU AFTER SURGERY IN YOUR PRIVATE ROOM DURING VISITING HOURS ONLY!              Willie Pineda    Your procedure is scheduled on: 04/29/21   Report to [facility] Main  Entrance   Report to admitting at  7:50 AM     Call this number if you have problems the morning of surgery [place], NO [place].   No food after midnight.    You may have clear liquid until 7:00 AM.    At 6:30 AM drink pre surgery drink.   Nothing by mouth after 7:00 AM.   Take these medicines the morning of surgery with A SIP OF WATER: Gabapentin, Zoloft, Metoprolol, Entresto, Allopurinol, Prilosec, Catapres if needed. Bring mask and tubing   How to Manage Your Diabetes Before and After Surgery  Why is it important to control my blood sugar before and after surgery? . Improving blood sugar levels before and after surgery helps healing and can limit problems. . A way of improving blood sugar control is eating a healthy diet by: o  Eating less sugar and carbohydrates o  Increasing activity/exercise o  Talking with your doctor about reaching your blood sugar goals . High blood sugars (greater than 180 mg/dL) can raise your risk of infections and slow your recovery, so you will need to focus on controlling your diabetes during the weeks before surgery. . Make sure that the doctor who takes care of your diabetes knows about your planned surgery including the date and location.  How do I manage my blood sugar before surgery? . Check your blood sugar at least 4 times a day, starting 2 days before surgery, to make sure that the level is not too high or low. o Check your blood sugar the morning of your surgery when you wake up and every 2 hours until you get  to the Short Stay unit. . If your blood sugar is less than 70 mg/dL, you will need to treat for low blood sugar: o Do not take insulin. o Treat a low blood sugar (less than 70 mg/dL) with  cup of clear juice (cranberry or apple), 4 glucose tablets, OR glucose gel. o Recheck blood sugar in 15 minutes after treatment (to make sure it is greater than 70 mg/dL). If your blood sugar is not greater than 70 mg/dL on recheck, call [phone] for further instructions. . Report your blood sugar to the short stay nurse when you get to Short Stay.  . If you are admitted to the hospital after surgery: o Your blood sugar will be checked by the staff and you will probably be given insulin after surgery (instead of oral diabetes medicines) to make sure you have good blood sugar levels. o The goal for blood sugar control after surgery is 80-180 mg/dL.   WHAT DO I DO ABOUT MY DIABETES MEDICATION?  Marland Kitchen Do not take oral diabetes medicines (pills) the morning of surgery. .  . The day of surgery, do not take other diabetes injectables, including Byetta (exenatide), Bydureon (exenatide ER), Victoza (liraglutide), or Trulicity (dulaglutide).  You may not have any metal on your body including               piercings  Do not wear jewelry, , lotions, powders or deodorant                           Men may shave face and neck.   Do not bring valuables to the hospital. [place].  Contacts, dentures or bridgework may not be worn into surgery.  .     Patients discharged the day of surgery will not be allowed to drive home.   IF YOU ARE HAVING SURGERY AND GOING HOME THE SAME DAY, YOU MUST HAVE AN ADULT TO DRIVE YOU HOME AND BE WITH YOU FOR 24 HOURS. YOU MAY GO HOME BY TAXI OR UBER OR ORTHERWISE, BUT AN ADULT MUST ACCOMPANY YOU HOME AND STAY WITH YOU FOR 24 HOURS.  Name and phone number of your driver:  Special Instructions:  N/A              Please read over the following fact sheets you were given: _____________________________________________________________________  [facility]- Preparing for Total Shoulder Arthroplasty    Before surgery, you can play an important role. Because skin is not sterile, your skin needs to be as free of germs as possible. You can reduce the number of germs on your skin by using the following products. . Benzoyl Peroxide Gel o Reduces the number of germs present on the skin o Applied twice a day to shoulder area starting two days before surgery    ==================================================================  Please follow these instructions carefully:  BENZOYL PEROXIDE 5% GEL  Please do not use if you have an allergy to benzoyl peroxide.   If your skin becomes reddened/irritated stop using the benzoyl peroxide.  Starting two days before surgery, apply as follows: 1. Apply benzoyl peroxide in the morning and at night. Apply after taking a shower. If you are not taking a shower clean entire shoulder front, back, and side along with the armpit with a clean wet washcloth.  2. Place a quarter-sized dollop on your shoulder and rub in thoroughly, making sure to cover the front, back, and side of your shoulder, along with the armpit.   2 days before ____ AM   ____ PM              1 day before ____ AM   ____ PM                         3. Do this twice a day for two days.  (Last application is the night before surgery, AFTER using the CHG soap as described below).  4. Do NOT apply benzoyl peroxide gel on the day of surgery.            [place] - Preparing for Surgery Before surgery, you can play an important role.  Because skin is not sterile, your skin needs to be as free of germs as possible.  You can reduce the number of germs on your skin by washing with CHG (chlorahexidine gluconate) soap before surgery.  CHG is an antiseptic cleaner which kills germs and bonds with the  skin to continue killing germs even after washing. Please DO NOT use if you have an allergy to CHG  or antibacterial soaps.  If your skin becomes reddened/irritated stop using the CHG and inform your nurse when you arrive at Short Stay.   You may shave your face/neck.  Please follow these instructions carefully:  1.  Shower with CHG Soap the night before surgery and the  morning of Surgery.  2.  If you choose to wash your hair, wash your hair first as usual with your  normal  shampoo.  3.  After you shampoo, rinse your hair and body thoroughly to remove the  shampoo.                                        4.  Use CHG as you would any other liquid soap.  You can apply chg directly  to the skin and wash                       Gently with a scrungie or clean washcloth.  5.  Apply the CHG Soap to your body ONLY FROM THE NECK DOWN.   Do not use on face/ open                           Wound or open sores. Avoid contact with eyes, ears mouth and genitals (private parts).                       Wash face,  Genitals (private parts) with your normal soap.             6.  Wash thoroughly, paying special attention to the area where your surgery  will be performed.  7.  Thoroughly rinse your body with warm water from the neck down.  8.  DO NOT shower/wash with your normal soap after using and rinsing off  the CHG Soap.             9.  Pat yourself dry with a clean towel.            10.  Wear clean pajamas.            11.  Place clean sheets on your bed the night of your first shower and do not  sleep with pets. Day of Surgery : Do not apply any lotions/deodorants the morning of surgery.  Please wear clean clothes to the hospital/surgery center.  FAILURE TO FOLLOW THESE INSTRUCTIONS MAY RESULT IN THE CANCELLATION OF YOUR SURGERY PATIENT SIGNATURE_________________________________  NURSE  SIGNATURE__________________________________  ________________________________________________________________________   Willie Pineda  An incentive spirometer is a tool that can help keep your lungs clear and active. This tool measures how well you are filling your lungs with each breath. Taking long deep breaths may help reverse or decrease the chance of developing breathing (pulmonary) problems (especially infection) following:  A long period of time when you are unable to move or be active. BEFORE THE PROCEDURE   If the spirometer includes an indicator to show your best effort, your nurse or respiratory therapist will set it to a desired goal.  If possible, sit up straight or lean slightly forward. Try not to slouch.  Hold the incentive spirometer in an upright position. INSTRUCTIONS FOR USE  1. Sit on the edge of your bed if possible, or sit up as far as you can in bed or on a chair. 2.  Hold the incentive spirometer in an upright position. 3. Breathe out normally. 4. Place the mouthpiece in your mouth and seal your lips tightly around it. 5. Breathe in slowly and as deeply as possible, raising the piston or the ball toward the top of the column. 6. Hold your breath for 3-5 seconds or for as long as possible. Allow the piston or ball to fall to the bottom of the column. 7. Remove the mouthpiece from your mouth and breathe out normally. 8. Rest for a few seconds and repeat Steps 1 through 7 at least 10 times every 1-2 hours when you are awake. Take your time and take a few normal breaths between deep breaths. 9. The spirometer may include an indicator to show your best effort. Use the indicator as a goal to work toward during each repetition. 10. After each set of 10 deep breaths, practice coughing to be sure your lungs are clear. If you have an incision (the cut made at the time of surgery), support your incision when coughing by placing a pillow or rolled up towels firmly  against it. Once you are able to get out of bed, walk around indoors and cough well. You may stop using the incentive spirometer when instructed by your caregiver.  RISKS AND COMPLICATIONS  Take your time so you do not get dizzy or light-headed.  If you are in pain, you may need to take or ask for pain medication before doing incentive spirometry. It is harder to take a deep breath if you are having pain. AFTER USE  Rest and breathe slowly and easily.  It can be helpful to keep track of a log of your progress. Your caregiver can provide you with a simple table to help with this. If you are using the spirometer at home, follow these instructions: [place] IF:   You are having difficultly using the spirometer.  You have trouble using the spirometer as often as instructed.  Your pain medication is not giving enough relief while using the spirometer.  You develop fever of 100.5 F (38.1 C) or higher. SEEK IMMEDIATE MEDICAL CARE IF:   You cough up bloody sputum that had not been present before.  You develop fever of 102 F (38.9 C) or greater.  You develop worsening pain at or near the incision site. MAKE SURE YOU:   Understand these instructions.  Will watch your condition.  Will get help right away if you are not doing well or get worse. Document Released: 04/04/2007 Document Revised: 02/14/2012 Document Reviewed: 06/05/2007 [facility] Patient Information 2014 [place], [place].   ________________________________________________________________________

## 2021-04-27 ENCOUNTER — Encounter (HOSPITAL_COMMUNITY): Payer: Self-pay

## 2021-04-27 ENCOUNTER — Other Ambulatory Visit: Payer: Self-pay

## 2021-04-27 ENCOUNTER — Encounter (HOSPITAL_COMMUNITY)
Admission: RE | Admit: 2021-04-27 | Discharge: 2021-04-27 | Disposition: A | Discharge status: Home / Self Care | Payer: Medicare Other | Source: Ambulatory Visit | Attending: Orthopaedic Surgery | Admitting: Orthopaedic Surgery

## 2021-04-27 DIAGNOSIS — Z01812 Encounter for preprocedural laboratory examination: Secondary | ICD-10-CM | POA: Insufficient documentation

## 2021-04-27 HISTORY — DX: Atherosclerotic heart disease of native coronary artery without angina pectoris: I25.10

## 2021-04-27 LAB — CBC
HCT: 42.5 % (ref 39.0–52.0)
Hemoglobin: 14.1 g/dL (ref 13.0–17.0)
MCH: 31.9 pg (ref 26.0–34.0)
MCHC: 33.2 g/dL (ref 30.0–36.0)
MCV: 96.2 fL (ref 80.0–100.0)
Platelets: 286 10*3/uL (ref 150–400)
RBC: 4.42 MIL/uL (ref 4.22–5.81)
RDW: 14.3 % (ref 11.5–15.5)
WBC: 11.5 10*3/uL — ABNORMAL HIGH (ref 4.0–10.5)
nRBC: 0 % (ref 0.0–0.2)

## 2021-04-27 LAB — BASIC METABOLIC PANEL
Anion gap: 8 (ref 5–15)
BUN: 30 mg/dL — ABNORMAL HIGH (ref 8–23)
CO2: 27 mmol/L (ref 22–32)
Calcium: 9.3 mg/dL (ref 8.9–10.3)
Chloride: 101 mmol/L (ref 98–111)
Creatinine, Ser: 1.13 mg/dL (ref 0.61–1.24)
GFR, Estimated: >60 mL/min (ref >60)
Glucose, Bld: 135 mg/dL — ABNORMAL HIGH (ref 70–99)
Potassium: 3.7 mmol/L (ref 3.5–5.1)
Sodium: 136 mmol/L (ref 135–145)

## 2021-04-27 LAB — HEMOGLOBIN A1C
Hgb A1c MFr Bld: 6.5 % — ABNORMAL HIGH (ref 4.8–5.6)
Mean Plasma Glucose: 139.85 mg/dL

## 2021-04-27 LAB — SURGICAL PCR SCREEN
MRSA, PCR: NEGATIVE
Staphylococcus aureus: NEGATIVE

## 2021-04-27 LAB — GLUCOSE, CAPILLARY: Glucose-Capillary: 140 mg/dL — ABNORMAL HIGH (ref 70–99)

## 2021-04-27 NOTE — Progress Notes (Signed)
COVID Vaccine Completed:yes Date COVID Vaccine completed:02/04/20 booster 2021 COVID vaccine manufacturer: Pfizer      PCP - Dr. Brayton Layman Cardiologist - Dr. Nicholos Johns  Chest x-ray - no EKG - 03/05/21-epic Stress Test - no ECHO - 03/25/21-epic Cardiac Cath - no Pacemaker/ICD device last checked: NA  Sleep Study - yes CPAP - yes  Fasting Blood Sugar - 114-140 Checks Blood Sugar _____ times a day" once in a while"  Blood Thinner Instructions:Xarelto/Dr. Renoldo Aspirin Instructions:stop 3 days prior to DOS Last Dose:04/23/21  Anesthesia review:   Patient denies shortness of breath, fever, cough and chest pain at PAT appointment Yes. Pt's BMI is 37.1 he can climb 1-2 flights of stairs, do house work, or ADLs  Patient verbalized understanding of instructions that were given to them at the PAT appointment. Patient was also instructed that they will need to review over the PAT instructions again at home before surgery.yes

## 2021-04-28 NOTE — Anesthesia Preprocedure Evaluation (Addendum)
Anesthesia Evaluation  Patient identified by MRN, date of birth, ID band Patient awake    Reviewed: Allergy & Precautions, NPO status , Patient's Chart, lab work & pertinent test results, reviewed documented beta blocker date and time   History of Anesthesia Complications Negative for: history of anesthetic complications  Airway Mallampati: II  TM Distance: >3 FB Neck ROM: Full    Dental  (+) Dental Advisory Given, Chipped   Pulmonary sleep apnea (BiPAP) , COPD,  COPD inhaler, former smoker,  04/29/2021 SARS coronavirus NEG   breath sounds clear to auscultation       Cardiovascular hypertension, Pt. on medications and Pt. on home beta blockers (-) angina+ Cardiac Stents, + Peripheral Vascular Disease and +CHF (entresto)  + dysrhythmias Atrial Fibrillation  Rhythm:Irregular Rate:Normal  03/2021 ECHO:  LeftVentricle: Left ventricle was not well visualized. Left ventricle  is mildly dilated.  Marland Kitchen LeftVentricle: Severe global hypokinesis.  Marland Kitchen LeftVentricle: There is moderate concentric hypertrophy.  . LeftVentricle: Systolic function is severely abnormal. EF: 25-30%.  . LeftAtrium: Left atrium is moderately dilated.  . AorticValve: Trace aortic valve regurgitation.  . TricuspidValve: There is mild regurgitation.     Neuro/Psych Depression CVA (brainstem), No Residual Symptoms    GI/Hepatic Neg liver ROS, GERD  Medicated and Controlled,  Endo/Other  diabetes (glu 124)  Renal/GU negative Renal ROS     Musculoskeletal   Abdominal (+) + obese,   Peds  Hematology Xarelto: last dose 7 days ago   Anesthesia Other Findings   Reproductive/Obstetrics                           Anesthesia Physical Anesthesia Plan  ASA: III  Anesthesia Plan: General   Post-op Pain Management: GA combined w/ Regional for post-op pain   Induction: Intravenous  PONV Risk Score and Plan: 2 and Ondansetron and  Dexamethasone  Airway Management Planned: Oral ETT  Additional Equipment: None  Intra-op Plan:   Post-operative Plan: Extubation in OR  Informed Consent: I have reviewed the patients History and Physical, chart, labs and discussed the procedure including the risks, benefits and alternatives for the proposed anesthesia with the patient or authorized representative who has indicated his/her understanding and acceptance.     Dental advisory given  Plan Discussed with: CRNA and Surgeon  Anesthesia Plan Comments: (See PAT note 04/27/2021, Konrad Felix, PA-C Plan routine monitors, GETA with interscalene block for post op analgesia)       Anesthesia Quick Evaluation

## 2021-04-28 NOTE — H&P (Signed)
PREOPERATIVE H&P  Chief Complaint: septic left shoulder  HPI: Willie Pineda is a 75 y.o. male who is scheduled for Procedure(s): REVERSE SHOULDER ARTHROPLASTY.   Willie Pineda is a 75 y.o. male with  medical history significant foratrial fibrillation, HTN, DMT2-diet controlled, OA, CHF, COPD, OSA. He has had chronic left shoulder pain for many years. Pain worsened in December after a fall where he landed on his left shoulder. He initially thought the pain was getting better with time. He had injections that helped some, but over the past 2-3 weeks his pain was very severe. He has limited range of motion. He saw Dr. Griffin Basil on 02/27/2021 in the office. CT scan showed signs of infection. He was admitted to the hospital on 03/03/2021 for IV antibiotics and plan for surgery. He had a Left shoulder incision and debridement with antibiotic spacer replacement 03-04-21. He was being seen by Infectious Disease. P. Acnes was isolated and he was continued on antibiotics. His shoulder pain continued to increase since surgery. MRI was obtained. MRI showed concerns for worsening septic arthritis.   His symptoms are rated as moderate to severe, and have been worsening.  This is significantly impairing activities of daily living.    Please see clinic note for further details on this patient's care.    He has elected for surgical management.   Past Medical History:  Diagnosis Date  . Arthritis    "hands primarily" (05/05/2016)  . Atrial fibrillation ([place])   . Back pain    age related  . CHF (congestive heart failure) ([place])    no problemes since 2018  . Complication of anesthesia    Bronchial spasms  . COPD (chronic obstructive pulmonary disease) (H[facility])    inhalers as needed  . Coronary artery disease   . Depression    takes Zoloft daily  . Diabetic peripheral neuropathy ([place]) 01/03/2019  . Dysrhythmia    A Fib-takes Xarelto daily  . Enlarged prostate    benign  . GERD (gastroesophageal reflux  disease)    takes Omeprazole daily  . 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 Hyperlipidemia    takes Atorvastatin daily  . Hypertension    takes Metoprolol,HCTZ,and Losartan daily.Takes Clonidine if needed  . Nocturia   . Peripheral neuropathy    takes Gabapentin daily  . Peripheral vascular disease (HCC)    legs blockage   . Pneumonia ?2015  . Restless leg   . Sleep apnea    uses Bipap  . Stroke [facility]) 2012   denies residual on 05/05/2016   Past Surgical History:  Procedure Laterality Date  . AMPUTATION Left 04/03/2021   Procedure: LEFT 2ND RAY AMPUTATION;  Surgeon: Newt Minion, MD;  Location: [place];  Service: Orthopedics;  Laterality: Left;  . CARPAL TUNNEL RELEASE    . CATARACT EXTRACTION W/ INTRAOCULAR LENS  IMPLANT, BILATERAL Bilateral   . COLONOSCOPY WITH PROPOFOL N/A 03/09/2017   Procedure: COLONOSCOPY WITH PROPOFOL;  Surgeon: Wonda Horner, MD;  Location: [facility] ENDOSCOPY;  Service: Endoscopy;  Laterality: N/A;  . COLONOSCOPY WITH PROPOFOL N/A 04/03/2020   Procedure: COLONOSCOPY WITH PROPOFOL;  Surgeon: Wonda Horner, MD;  [facility];  Service: Endoscopy;  Laterality: N/A;  . I & D KNEE WITH POLY EXCHANGE Right 05/04/2016   IRRIGATION AND DEBRIDEMENT Right KNEE WITH UNICOMPARTMENTAL POLY EXCHANGE  . I & D KNEE WITH POLY EXCHANGE Right 05/04/2016  Procedure: IRRIGATION AND DEBRIDEMENT Right KNEE WITH UNICOMPARTMENTAL POLY EXCHANGE;  Surgeon: Renette Butters, MD;  Location: [place];  Service: Orthopedics;  Laterality: Right;  . JOINT REPLACEMENT    . LAPAROSCOPIC CHOLECYSTECTOMY  1995  . PARTIAL KNEE ARTHROPLASTY Right 04/06/2016   Procedure: UNICOMPARTMENTAL RIGHT KNEE;  Surgeon: Renette Butters, MD;  Location: [place];  Service: Orthopedics;  Laterality: Right;  . POLYPECTOMY  04/03/2020   Procedure: POLYPECTOMY;  Surgeon: Wonda Horner, MD;  [facility];  Service: Endoscopy;;  . STRABISMUS SURGERY Bilateral 06/10/2017    Procedure: REPAIR STRABISMUS BILATERAL;  Surgeon: Everitt Amber, MD;  Location: [place];  Service: Ophthalmology;  Laterality: Bilateral;  . TONSILLECTOMY AND ADENOIDECTOMY    . TOTAL KNEE ARTHROPLASTY Left 2008  . TOTAL SHOULDER ARTHROPLASTY Left 03/04/2021   Procedure: Total Shoulder Arthroplasty;  Surgeon: [name], MD;  [facility];  Service: Orthopedics;  Laterality: Left;  . ULNAR NERVE TRANSPOSITION Right 1989   Social History   Socioeconomic History  . Marital status: Married    Spouse name: Not on file  . Number of children: Not on file  . Years of education: Not on file  . Highest education level: Not on file  Occupational History  . Occupation: Retired Therapist, sports  Tobacco Use  . Smoking status: Former Smoker    Packs/day: 0.50    Years: 40.00    Pack years: 20.00    Types: Cigarettes    Quit date: 07/15/2011    Years since quitting: 9.7  . Smokeless tobacco: Former Systems developer    Types: Secondary school teacher  . Vaping Use: Never used  Substance and Sexual Activity  . Alcohol use: Yes    Comment: maybe 2 glases wine every other week  . Drug use: No  . Sexual activity: Yes  Other Topics Concern  . Not on file  Social History Narrative   Lives at home with spouse    Caffeine 2 cups of coffee daily    Right handed.    Social Determinants of Health   Financial Resource Strain: Not on file  Food Insecurity: Not on file  Transportation Needs: Not on file  Physical Activity: Not on file  Stress: Not on file  Social Connections: Not on file   Family History  Problem Relation Age of Onset  . CVA Mother   . Liver cancer Father    Allergies  Allergen Reactions  . Penicillins Anaphylaxis  . Alfalfa   . Dymista [Azelastine-Fluticasone] Other (See Comments)    Burning sensation   . Flonase [Fluticasone Propionate] Other (See Comments)    burning  . Hydrocodone Other (See Comments)    SWEATING  . Warfarin And Related Other (See Comments)    Pt states  it makes his INR critical levels   Prior to Admission medications   Medication Sig Start Date End Date Taking? Authorizing Provider  acetaminophen (TYLENOL) 500 MG tablet Take 2,000 mg by mouth every 6 (six) hours as needed for mild pain or moderate pain.   Yes [provider]  allopurinol (ZYLOPRIM) 100 MG tablet Take 1 tablet (100 mg total) by mouth 2 (two) times daily. 04/02/21  Yes Newt Minion, MD  atorvastatin (LIPITOR) 10 MG tablet Take 10 mg by mouth every other day. In the evening 02/06/16  Yes [provider]  carboxymethylcellulose (REFRESH PLUS) 0.5 % SOLN Place 1 drop into both eyes 3 (three) times daily as needed (dry/irritated eyes).  Yes [provider]  cloNIDine (CATAPRES) 0.1 MG tablet Take 0.1 mg by mouth daily as needed (Only takes if pt if bp  > 160).   Yes [provider]  colchicine 0.6 MG tablet Take 1 tablet (0.6 mg total) by mouth daily. As needed for gout pain. Patient taking differently: Take 0.6 mg by mouth daily as needed (gout pain). 04/02/21  Yes Newt Minion, MD  Cyanocobalamin (VITAMIN B 12) 500 MCG TABS Take 500 mcg by mouth in the morning.   Yes [provider]  cyclobenzaprine (FLEXERIL) 10 MG tablet Take 10 mg by mouth at bedtime as needed for muscle spasms.  01/06/20  Yes [provider]  diclofenac Sodium (VOLTAREN) 1 % GEL Apply 2 g topically 2 (two) times daily as needed (pain).   Yes [provider]  doxycycline (VIBRA-TABS) 100 MG tablet Take 1 tablet (100 mg total) by mouth 2 (two) times daily. End date 04/15/2021 04/15/21 05/26/21 Yes Tommy Medal, Lavell Islam, MD  ENTRESTO 24-26 MG Take 1 tablet by mouth 2 (two) times daily. 03/13/20  Yes [provider]  gabapentin (NEURONTIN) 300 MG capsule Take 900 mg by mouth 2 (two) times daily.   Yes [provider]  metolazone (ZAROXOLYN) 2.5 MG tablet Take 2.5 mg by mouth daily as needed (fluid retention).   Yes [provider]   metoprolol succinate (TOPROL-XL) 50 MG 24 hr tablet Take 50 mg by mouth in the morning. Take with or immediately following a meal.   Yes [provider]  nitroGLYCERIN (NITRODUR - DOSED IN MG/24 HR) 0.2 mg/hr patch Place 1 patch (0.2 mg total) onto the skin daily. Patient taking differently: Place 0.2 mg onto the skin in the morning. Applied to foot 04/21/21  Yes Persons, Bevely Palmer, PA  omeprazole (PRILOSEC) 20 MG capsule Take 20 mg by mouth in the morning and at bedtime.   Yes [provider]  oxyCODONE-acetaminophen (PERCOCET) 5-325 MG tablet Take 1 tablet by mouth every 4 (four) hours as needed. Patient taking differently: Take 1 tablet by mouth every 4 (four) hours as needed (pain). 04/03/21 04/03/22 Yes Persons, Bevely Palmer, PA  potassium chloride (MICRO-K) 10 MEQ CR capsule Take 10 mEq by mouth in the morning.   Yes [provider]  rivaroxaban (XARELTO) 20 MG TABS tablet Take 20 mg by mouth in the morning.   Yes [provider]  Semaglutide,0.25 or 0.5MG /DOS, (OZEMPIC, 0.25 OR 0.5 MG/DOSE,) 2 MG/1.5ML SOPN Inject 1 mg into the skin every Monday.   Yes [provider]  sertraline (ZOLOFT) 100 MG tablet Take 100 mg by mouth in the morning.   Yes [provider]  spironolactone (ALDACTONE) 25 MG tablet Take 25 mg by mouth in the morning.   Yes [provider]  torsemide (DEMADEX) 100 MG tablet Take 100 mg by mouth in the morning. 09/29/18  Yes [provider]  traMADol (ULTRAM) 50 MG tablet Take 50-100 mg by mouth every 8 (eight) hours as needed. 03/31/21  Yes [provider]    ROS: All other systems have been reviewed and were otherwise negative with the exception of those mentioned in the HPI and as above.  Physical Exam: General: Alert, no acute distress Cardiovascular: No pedal edema Respiratory: No cyanosis, no use of accessory musculature GI: No organomegaly, abdomen is soft and non-tender Skin: No lesions  in the area of chief complaint Neurologic: Sensation intact distally Psychiatric: Patient is competent for consent with normal mood and  affect Lymphatic: No axillary or cervical lymphadenopathy  MUSCULOSKELETAL:  Left shoulder: incision is benign and healing well. Axillary nerve is firing. Pain with ROM. NVI.   Imaging: MRI of the left shoulder showing signs of possible worsening septic arthritis with a rim enhancing join effusion  Assessment: septic left shoulder  Plan: Plan for Procedure(s): REVERSE SHOULDER ARTHROPLASTY  The risks benefits and alternatives were discussed with the patient including but not limited to the risks of nonoperative treatment, versus surgical intervention including infection, bleeding, nerve injury,  blood clots, cardiopulmonary complications, morbidity, mortality, among others, and they were willing to proceed.   We additionally specifically discussed risks of axillary nerve injury, infection, periprosthetic fracture, continued pain and longevity of implants prior to beginning procedure.    Patient will be closely monitored in PACU for medical stabilization and pain control. If found stable in PACU, patient may be discharged home with outpatient follow-up. If any concerns regarding patient's stabilization patient will be admitted for observation after surgery. The patient is planning to be discharged home with outpatient PT.   The patient acknowledged the explanation, agreed to proceed with the plan and consent was signed.   He received operative clearance from his cardiologist, Dr. Mauricio Po  Operative Plan: Left shoulder irrigation and debridement with new antibiotic spacer versus revision to reverse total shoulder Discharge Medications: Tylenol, Oxycodone DVT Prophylaxis: Restart xarelto Physical Therapy: outpatient Special Discharge needs: sling. Iceman (may have this at home)   Ethelda Chick, PA-C  04/28/2021 12:53 PM

## 2021-04-28 NOTE — Progress Notes (Signed)
Pt aware to arrive at [facility] admitting at 0650 for covid test prior to scheduled surgical procedure

## 2021-04-28 NOTE — Progress Notes (Signed)
Anesthesia Chart Review   Case: [id number] Date/Time: 04/29/21 1005   Procedure: REVERSE SHOULDER ARTHROPLASTY (Left Shoulder)   Anesthesia type: Choice   Pre-op diagnosis: septic left shoulder   [facility] ROOM 08 / [facility]   Surgeons: [name], MD      DISCUSSION:74 y.o. former smoker with h/o GERD, HTN, COPD, sleep apnea (on bipap), atrial fibrillation (on Xarelto, advised to hold 3 days prior to surgery), CHF (EF 25-30% on Echo 03/25/2021), CAD, PVD, septic left shoulder scheduled for above procedure 04/29/2021 with Dr. [name].   Pt last seen by cardiology 03/25/2021. Per OV note pt doing well clinically, but has had a decline in LVEF. On Maximal tolerated dose of Toprol and Entresto.  Started on Aldactone and referred to electrophysiology.  Dr. Mauricio Po aware of upcoming procedure.  Concerns of placing ICD with current infections.  Per ID once infections are resolved could have ICD safely placed.   S/p left total shoulder arthroplasty 03/04/2021. Per Anesthesia notes, "Had issues with bronchospasm during anesthesia with one of his cscopes in past at a day surgery center- can't remember if it was before or after his 2018 cscope at [facility] (but had no issues during the 2018 cscope"  No anesthesia complications noted with most recent procedures.   VS: BP (!) 144/82   Pulse 89   Temp 36.8 C   Resp 20   Ht 6' (1.829 m)   Wt 124.3 kg   SpO2 96%   BMI 37.16 kg/m   PROVIDERS: Josetta Huddle, MD is PCP   Lamar Blinks, MD is Cardiologist   Tommy Medal, Lavell Islam, MD is ID LABS: Labs reviewed: Acceptable for surgery. (all labs ordered are listed, but only abnormal results are displayed)  Labs Reviewed  HEMOGLOBIN A1C - Abnormal; Notable for the following components:      Result Value   Hgb A1c MFr Bld 6.5 (*)    All other components within normal limits  BASIC METABOLIC PANEL - Abnormal; Notable for the following components:   Glucose, Bld 135 (*)    BUN 30 (*)    All other  components within normal limits  CBC - Abnormal; Notable for the following components:   WBC 11.5 (*)    All other components within normal limits  GLUCOSE, CAPILLARY - Abnormal; Notable for the following components:   Glucose-Capillary 140 (*)    All other components within normal limits  SURGICAL PCR SCREEN     IMAGES:   EKG: 03/05/2021 Rate 86 bpm  Atrial fibrillation  Right bundle branch block   CV: Echo 03/25/2021 LeftVentricle: Left ventricle was not well visualized. Left ventricle  is mildly dilated.  Marland Kitchen LeftVentricle: Severe global hypokinesis.  Marland Kitchen LeftVentricle: There is moderate concentric hypertrophy.  . LeftVentricle: Systolic function is severely abnormal. EF: 25-30%.  . LeftAtrium: Left atrium is moderately dilated.  . AorticValve: Trace aortic valve regurgitation.  . TricuspidValve: There is mild regurgitation.  . TricuspidValve: The right ventricular systolic pressure is normal (<36  mmHg).  Past Medical History:  Diagnosis Date  . Arthritis    "hands primarily" (05/05/2016)  . Atrial fibrillation ([place])   . Back pain    age related  . CHF (congestive heart failure) ([place])    no problemes since 2018  . Complication of anesthesia    Bronchial spasms  . COPD (chronic obstructive pulmonary disease) (H[facility])    inhalers as needed  . Coronary artery disease   . Depression    takes  Zoloft daily  . Diabetic peripheral neuropathy ([place]) 01/03/2019  . Dysrhythmia    A Fib-takes Xarelto daily  . Enlarged prostate    benign  . GERD (gastroesophageal reflux disease)    takes Omeprazole daily  . 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 Hyperlipidemia    takes Atorvastatin daily  . Hypertension    takes Metoprolol,HCTZ,and Losartan daily.Takes Clonidine if needed  . Nocturia   . Peripheral neuropathy    takes Gabapentin daily  . Peripheral vascular disease (H[facility])    legs blockage   . Pneumonia ?2015  . Restless  leg   . Sleep apnea    uses Bipap  . Stroke [facility]) 2012   denies residual on 05/05/2016    Past Surgical History:  Procedure Laterality Date  . AMPUTATION Left 04/03/2021   Procedure: LEFT 2ND RAY AMPUTATION;  Surgeon: Newt Minion, MD;  Location: [place];  Service: Orthopedics;  Laterality: Left;  . CARPAL TUNNEL RELEASE    . CATARACT EXTRACTION W/ INTRAOCULAR LENS  IMPLANT, BILATERAL Bilateral   . COLONOSCOPY WITH PROPOFOL N/A 03/09/2017   Procedure: COLONOSCOPY WITH PROPOFOL;  Surgeon: Wonda Horner, MD;  Location: [facility] ENDOSCOPY;  Service: Endoscopy;  Laterality: N/A;  . COLONOSCOPY WITH PROPOFOL N/A 04/03/2020   Procedure: COLONOSCOPY WITH PROPOFOL;  Surgeon: Wonda Horner, MD;  [facility];  Service: Endoscopy;  Laterality: N/A;  . I & D KNEE WITH POLY EXCHANGE Right 05/04/2016   IRRIGATION AND DEBRIDEMENT Right KNEE WITH UNICOMPARTMENTAL POLY EXCHANGE  . I & D KNEE WITH POLY EXCHANGE Right 05/04/2016   Procedure: IRRIGATION AND DEBRIDEMENT Right KNEE WITH UNICOMPARTMENTAL POLY EXCHANGE;  Surgeon: Renette Butters, MD;  Location: [place];  Service: Orthopedics;  Laterality: Right;  . JOINT REPLACEMENT    . LAPAROSCOPIC CHOLECYSTECTOMY  1995  . PARTIAL KNEE ARTHROPLASTY Right 04/06/2016   Procedure: UNICOMPARTMENTAL RIGHT KNEE;  Surgeon: Renette Butters, MD;  Location: [place];  Service: Orthopedics;  Laterality: Right;  . POLYPECTOMY  04/03/2020   Procedure: POLYPECTOMY;  Surgeon: Wonda Horner, MD;  [facility];  Service: Endoscopy;;  . STRABISMUS SURGERY Bilateral 06/10/2017   Procedure: REPAIR STRABISMUS BILATERAL;  Surgeon: Everitt Amber, MD;  Location: [place];  Service: Ophthalmology;  Laterality: Bilateral;  . TONSILLECTOMY AND ADENOIDECTOMY    . TOTAL KNEE ARTHROPLASTY Left 2008  . TOTAL SHOULDER ARTHROPLASTY Left 03/04/2021   Procedure: Total Shoulder Arthroplasty;  Surgeon: [name], MD;  [facility];  Service: Orthopedics;   Laterality: Left;  . ULNAR NERVE TRANSPOSITION Right 1989    MEDICATIONS: . acetaminophen (TYLENOL) 500 MG tablet  . allopurinol (ZYLOPRIM) 100 MG tablet  . atorvastatin (LIPITOR) 10 MG tablet  . carboxymethylcellulose (REFRESH PLUS) 0.5 % SOLN  . cloNIDine (CATAPRES) 0.1 MG tablet  . colchicine 0.6 MG tablet  . Cyanocobalamin (VITAMIN B 12) 500 MCG TABS  . cyclobenzaprine (FLEXERIL) 10 MG tablet  . diclofenac Sodium (VOLTAREN) 1 % GEL  . doxycycline (VIBRA-TABS) 100 MG tablet  . ENTRESTO 24-26 MG  . gabapentin (NEURONTIN) 300 MG capsule  . metolazone (ZAROXOLYN) 2.5 MG tablet  . metoprolol succinate (TOPROL-XL) 50 MG 24 hr tablet  . nitroGLYCERIN (NITRODUR - DOSED IN MG/24 HR) 0.2 mg/hr patch  . omeprazole (PRILOSEC) 20 MG capsule  . oxyCODONE-acetaminophen (PERCOCET) 5-325 MG tablet  . potassium chloride (MICRO-K) 10 MEQ CR capsule  . rivaroxaban (XARELTO) 20 MG  TABS tablet  . Semaglutide,0.25 or 0.5MG /DOS, (OZEMPIC, 0.25 OR 0.5 MG/DOSE,) 2 MG/1.5ML SOPN  . sertraline (ZOLOFT) 100 MG tablet  . spironolactone (ALDACTONE) 25 MG tablet  . torsemide (DEMADEX) 100 MG tablet  . traMADol (ULTRAM) 50 MG tablet   No current facility-administered medications for this encounter.     Konrad Felix, PA-C [facility] [phone]

## 2021-04-29 ENCOUNTER — Observation Stay (HOSPITAL_COMMUNITY)
Admission: RE | Admit: 2021-04-29 | Discharge: 2021-04-30 | Disposition: A | Discharge status: Home / Self Care | Payer: Medicare Other | Attending: Orthopaedic Surgery | Admitting: Orthopaedic Surgery

## 2021-04-29 ENCOUNTER — Encounter (HOSPITAL_COMMUNITY): Payer: Self-pay | Admitting: Orthopaedic Surgery

## 2021-04-29 ENCOUNTER — Ambulatory Visit (HOSPITAL_COMMUNITY): Payer: Medicare Other | Admitting: Physician Assistant

## 2021-04-29 ENCOUNTER — Encounter: Payer: Medicare Other | Admitting: Physician Assistant

## 2021-04-29 ENCOUNTER — Ambulatory Visit (HOSPITAL_COMMUNITY): Payer: Medicare Other

## 2021-04-29 ENCOUNTER — Encounter (HOSPITAL_COMMUNITY)
Admission: RE | Disposition: A | Discharge status: Home / Self Care | Payer: Self-pay | Source: Home / Self Care | Attending: Orthopaedic Surgery

## 2021-04-29 ENCOUNTER — Observation Stay: Payer: Self-pay

## 2021-04-29 ENCOUNTER — Other Ambulatory Visit: Payer: Self-pay

## 2021-04-29 ENCOUNTER — Ambulatory Visit (HOSPITAL_COMMUNITY): Payer: Medicare Other | Admitting: Anesthesiology

## 2021-04-29 ENCOUNTER — Observation Stay (HOSPITAL_COMMUNITY): Payer: Medicare Other

## 2021-04-29 DIAGNOSIS — Z96653 Presence of artificial knee joint, bilateral: Secondary | ICD-10-CM | POA: Insufficient documentation

## 2021-04-29 DIAGNOSIS — I4891 Unspecified atrial fibrillation: Secondary | ICD-10-CM | POA: Insufficient documentation

## 2021-04-29 DIAGNOSIS — Z471 Aftercare following joint replacement surgery: Secondary | ICD-10-CM | POA: Diagnosis not present

## 2021-04-29 DIAGNOSIS — M00212 Other streptococcal arthritis, left shoulder: Principal | ICD-10-CM | POA: Insufficient documentation

## 2021-04-29 DIAGNOSIS — J449 Chronic obstructive pulmonary disease, unspecified: Secondary | ICD-10-CM | POA: Diagnosis not present

## 2021-04-29 DIAGNOSIS — I110 Hypertensive heart disease with heart failure: Secondary | ICD-10-CM | POA: Diagnosis not present

## 2021-04-29 DIAGNOSIS — M00812 Arthritis due to other bacteria, left shoulder: Secondary | ICD-10-CM | POA: Diagnosis not present

## 2021-04-29 DIAGNOSIS — M009 Pyogenic arthritis, unspecified: Secondary | ICD-10-CM | POA: Diagnosis present

## 2021-04-29 DIAGNOSIS — Z79899 Other long term (current) drug therapy: Secondary | ICD-10-CM | POA: Diagnosis not present

## 2021-04-29 DIAGNOSIS — B955 Unspecified streptococcus as the cause of diseases classified elsewhere: Secondary | ICD-10-CM | POA: Insufficient documentation

## 2021-04-29 DIAGNOSIS — I482 Chronic atrial fibrillation, unspecified: Secondary | ICD-10-CM | POA: Diagnosis not present

## 2021-04-29 DIAGNOSIS — Z7901 Long term (current) use of anticoagulants: Secondary | ICD-10-CM | POA: Diagnosis not present

## 2021-04-29 DIAGNOSIS — G8918 Other acute postprocedural pain: Secondary | ICD-10-CM | POA: Diagnosis not present

## 2021-04-29 DIAGNOSIS — Z9889 Other specified postprocedural states: Secondary | ICD-10-CM | POA: Diagnosis not present

## 2021-04-29 DIAGNOSIS — E1140 Type 2 diabetes mellitus with diabetic neuropathy, unspecified: Secondary | ICD-10-CM | POA: Insufficient documentation

## 2021-04-29 DIAGNOSIS — R918 Other nonspecific abnormal finding of lung field: Secondary | ICD-10-CM | POA: Diagnosis not present

## 2021-04-29 DIAGNOSIS — Z20822 Contact with and (suspected) exposure to covid-19: Secondary | ICD-10-CM | POA: Diagnosis not present

## 2021-04-29 DIAGNOSIS — Z452 Encounter for adjustment and management of vascular access device: Secondary | ICD-10-CM | POA: Diagnosis not present

## 2021-04-29 DIAGNOSIS — Z95828 Presence of other vascular implants and grafts: Secondary | ICD-10-CM

## 2021-04-29 DIAGNOSIS — I2510 Atherosclerotic heart disease of native coronary artery without angina pectoris: Secondary | ICD-10-CM | POA: Diagnosis not present

## 2021-04-29 DIAGNOSIS — Z96612 Presence of left artificial shoulder joint: Secondary | ICD-10-CM | POA: Diagnosis not present

## 2021-04-29 DIAGNOSIS — Z7984 Long term (current) use of oral hypoglycemic drugs: Secondary | ICD-10-CM | POA: Insufficient documentation

## 2021-04-29 DIAGNOSIS — Z87891 Personal history of nicotine dependence: Secondary | ICD-10-CM | POA: Diagnosis not present

## 2021-04-29 DIAGNOSIS — I509 Heart failure, unspecified: Secondary | ICD-10-CM | POA: Diagnosis not present

## 2021-04-29 DIAGNOSIS — E876 Hypokalemia: Secondary | ICD-10-CM | POA: Diagnosis not present

## 2021-04-29 DIAGNOSIS — M00012 Staphylococcal arthritis, left shoulder: Secondary | ICD-10-CM | POA: Diagnosis not present

## 2021-04-29 DIAGNOSIS — M19012 Primary osteoarthritis, left shoulder: Secondary | ICD-10-CM | POA: Diagnosis not present

## 2021-04-29 DIAGNOSIS — Z09 Encounter for follow-up examination after completed treatment for conditions other than malignant neoplasm: Secondary | ICD-10-CM

## 2021-04-29 HISTORY — PX: REVERSE SHOULDER ARTHROPLASTY: SHX5054

## 2021-04-29 LAB — GLUCOSE, CAPILLARY
Glucose-Capillary: 124 mg/dL — ABNORMAL HIGH (ref 70–99)
Glucose-Capillary: 144 mg/dL — ABNORMAL HIGH (ref 70–99)
Glucose-Capillary: 203 mg/dL — ABNORMAL HIGH (ref 70–99)
Glucose-Capillary: 271 mg/dL — ABNORMAL HIGH (ref 70–99)
Glucose-Capillary: 213 mg/dL — ABNORMAL HIGH (ref 70–99)

## 2021-04-29 LAB — SARS CORONAVIRUS 2 BY RT PCR (HOSPITAL ORDER, PERFORMED IN ~~LOC~~ HOSPITAL LAB): SARS Coronavirus 2: NEGATIVE

## 2021-04-29 SURGERY — ARTHROPLASTY, SHOULDER, TOTAL, REVERSE
Anesthesia: General | Site: Shoulder | Laterality: Left

## 2021-04-29 MED ORDER — CLINDAMYCIN PHOSPHATE 900 MG/50ML IV SOLN
900.0000 mg | INTRAVENOUS | Status: AC
  Administered 2021-04-29: 900 mg via INTRAVENOUS
  Filled 2021-04-29: qty 50

## 2021-04-29 MED ORDER — BUPIVACAINE LIPOSOME 1.3 % IJ SUSP
INTRAMUSCULAR | Status: DC | PRN
  Administered 2021-04-29: 10 mL via PERINEURAL

## 2021-04-29 MED ORDER — BUPIVACAINE-EPINEPHRINE (PF) 0.5% -1:200000 IJ SOLN
INTRAMUSCULAR | Status: DC | PRN
  Administered 2021-04-29: 15 mL via PERINEURAL

## 2021-04-29 MED ORDER — LACTATED RINGERS IV SOLN: INTRAVENOUS | Status: DC

## 2021-04-29 MED ORDER — ZOLPIDEM TARTRATE 5 MG PO TABS: 5.0000 mg | ORAL_TABLET | Freq: Every evening | ORAL | Status: DC | PRN

## 2021-04-29 MED ORDER — SACUBITRIL-VALSARTAN 24-26 MG PO TABS: 1.0000 | ORAL_TABLET | Freq: Two times a day (BID) | ORAL | Status: DC

## 2021-04-29 MED ORDER — ETOMIDATE 2 MG/ML IV SOLN
INTRAVENOUS | Status: DC | PRN
  Administered 2021-04-29: 20 mg via INTRAVENOUS

## 2021-04-29 MED ORDER — VANCOMYCIN HCL 1000 MG IV SOLR
INTRAVENOUS | Status: AC
  Filled 2021-04-29: qty 1000

## 2021-04-29 MED ORDER — PHENOL 1.4 % MT LIQD: 1.0000 | OROMUCOSAL | Status: DC | PRN

## 2021-04-29 MED ORDER — MENTHOL 3 MG MT LOZG: 1.0000 | LOZENGE | OROMUCOSAL | Status: DC | PRN

## 2021-04-29 MED ORDER — DEXAMETHASONE SODIUM PHOSPHATE 10 MG/ML IJ SOLN
INTRAMUSCULAR | Status: DC | PRN
  Administered 2021-04-29: 10 mg via INTRAVENOUS

## 2021-04-29 MED ORDER — CHLORHEXIDINE GLUCONATE 0.12 % MT SOLN
15.0000 mL | Freq: Once | OROMUCOSAL | Status: AC
  Administered 2021-04-29: 15 mL via OROMUCOSAL

## 2021-04-29 MED ORDER — HYDROMORPHONE HCL 1 MG/ML IJ SOLN: 0.2500 mg | INTRAMUSCULAR | Status: DC | PRN

## 2021-04-29 MED ORDER — 0.9 % SODIUM CHLORIDE (POUR BTL) OPTIME
TOPICAL | Status: DC | PRN
  Administered 2021-04-29: 1000 mL

## 2021-04-29 MED ORDER — BISACODYL 10 MG RE SUPP: 10.0000 mg | Freq: Every day | RECTAL | Status: DC | PRN

## 2021-04-29 MED ORDER — ATORVASTATIN CALCIUM 10 MG PO TABS
10.0000 mg | ORAL_TABLET | ORAL | Status: DC
  Administered 2021-04-30: 10 mg via ORAL
  Filled 2021-04-29: qty 1

## 2021-04-29 MED ORDER — OXYCODONE HCL 5 MG PO TABS: 5.0000 mg | ORAL_TABLET | ORAL | Status: DC | PRN

## 2021-04-29 MED ORDER — ONDANSETRON HCL 4 MG/2ML IJ SOLN: 4.0000 mg | Freq: Four times a day (QID) | INTRAMUSCULAR | Status: DC | PRN

## 2021-04-29 MED ORDER — TORSEMIDE 100 MG PO TABS
100.0000 mg | ORAL_TABLET | Freq: Every day | ORAL | Status: DC
  Administered 2021-04-30: 100 mg via ORAL
  Filled 2021-04-29: qty 1

## 2021-04-29 MED ORDER — SACUBITRIL-VALSARTAN 24-26 MG PO TABS
1.0000 | ORAL_TABLET | Freq: Two times a day (BID) | ORAL | Status: DC
  Administered 2021-04-30: 1 via ORAL
  Filled 2021-04-29 (×2): qty 1

## 2021-04-29 MED ORDER — METHOCARBAMOL 500 MG PO TABS: 500.0000 mg | ORAL_TABLET | Freq: Four times a day (QID) | ORAL | Status: DC | PRN

## 2021-04-29 MED ORDER — ONDANSETRON HCL 4 MG PO TABS: 4.0000 mg | ORAL_TABLET | Freq: Four times a day (QID) | ORAL | Status: DC | PRN

## 2021-04-29 MED ORDER — PHENYLEPHRINE 40 MCG/ML (10ML) SYRINGE FOR IV PUSH (FOR BLOOD PRESSURE SUPPORT)
PREFILLED_SYRINGE | INTRAVENOUS | Status: DC | PRN
  Administered 2021-04-29 (×2): 80 ug via INTRAVENOUS

## 2021-04-29 MED ORDER — GABAPENTIN 300 MG PO CAPS
900.0000 mg | ORAL_CAPSULE | Freq: Two times a day (BID) | ORAL | Status: DC
  Administered 2021-04-29 – 2021-04-30 (×2): 900 mg via ORAL
  Filled 2021-04-29 (×2): qty 3

## 2021-04-29 MED ORDER — MEPERIDINE HCL 50 MG/ML IJ SOLN: 6.2500 mg | INTRAMUSCULAR | Status: DC | PRN

## 2021-04-29 MED ORDER — SODIUM CHLORIDE 0.9 % IR SOLN
Status: DC | PRN
  Administered 2021-04-29: 6000 mL

## 2021-04-29 MED ORDER — OXYCODONE HCL 5 MG/5ML PO SOLN: 5.0000 mg | Freq: Once | ORAL | Status: DC | PRN

## 2021-04-29 MED ORDER — ORAL CARE MOUTH RINSE
15.0000 mL | Freq: Once | OROMUCOSAL | Status: AC
Start: 2021-04-29 — End: 2021-04-29

## 2021-04-29 MED ORDER — FENTANYL CITRATE (PF) 250 MCG/5ML IJ SOLN
INTRAMUSCULAR | Status: AC
  Filled 2021-04-29: qty 5

## 2021-04-29 MED ORDER — MIDAZOLAM HCL 2 MG/2ML IJ SOLN
INTRAMUSCULAR | Status: DC | PRN
  Administered 2021-04-29: .5 mg via INTRAVENOUS
  Administered 2021-04-29: 1 mg via INTRAVENOUS

## 2021-04-29 MED ORDER — ACETAMINOPHEN 500 MG PO TABS
1000.0000 mg | ORAL_TABLET | Freq: Three times a day (TID) | ORAL | Status: DC
  Administered 2021-04-29 – 2021-04-30 (×3): 1000 mg via ORAL
  Filled 2021-04-29 (×3): qty 2

## 2021-04-29 MED ORDER — VANCOMYCIN HCL IN DEXTROSE 1-5 GM/200ML-% IV SOLN: 1000.0000 mg | Freq: Two times a day (BID) | INTRAVENOUS | Status: DC

## 2021-04-29 MED ORDER — MINERAL OIL LIGHT OIL
TOPICAL_OIL | Status: AC
  Filled 2021-04-29: qty 10

## 2021-04-29 MED ORDER — SODIUM CHLORIDE 0.9% FLUSH: 10.0000 mL | INTRAVENOUS | Status: DC | PRN

## 2021-04-29 MED ORDER — ACETAMINOPHEN 500 MG PO TABS
1000.0000 mg | ORAL_TABLET | Freq: Once | ORAL | Status: AC
  Administered 2021-04-29: 1000 mg via ORAL
  Filled 2021-04-29: qty 2

## 2021-04-29 MED ORDER — PHENYLEPHRINE HCL-NACL 10-0.9 MG/250ML-% IV SOLN
INTRAVENOUS | Status: DC | PRN
  Administered 2021-04-29: 25 ug/min via INTRAVENOUS
  Administered 2021-04-29: 50 ug/min via INTRAVENOUS
  Administered 2021-04-29: 40 ug/min via INTRAVENOUS

## 2021-04-29 MED ORDER — FENTANYL CITRATE (PF) 100 MCG/2ML IJ SOLN
50.0000 ug | INTRAMUSCULAR | Status: DC
  Filled 2021-04-29: qty 2

## 2021-04-29 MED ORDER — MIDAZOLAM HCL 2 MG/2ML IJ SOLN
1.0000 mg | INTRAMUSCULAR | Status: DC
  Filled 2021-04-29: qty 2

## 2021-04-29 MED ORDER — ONDANSETRON HCL 4 MG/2ML IJ SOLN
INTRAMUSCULAR | Status: DC | PRN
  Administered 2021-04-29: 4 mg via INTRAVENOUS

## 2021-04-29 MED ORDER — MINERAL OIL LIGHT OIL
TOPICAL_OIL | Status: DC | PRN
  Administered 2021-04-29: 1 via TOPICAL

## 2021-04-29 MED ORDER — SPIRONOLACTONE 25 MG PO TABS
25.0000 mg | ORAL_TABLET | Freq: Every day | ORAL | Status: DC
  Administered 2021-04-30: 25 mg via ORAL
  Filled 2021-04-29: qty 1

## 2021-04-29 MED ORDER — CHLORHEXIDINE GLUCONATE CLOTH 2 % EX PADS
6.0000 | MEDICATED_PAD | Freq: Every day | CUTANEOUS | Status: DC
  Administered 2021-04-30: 6 via TOPICAL

## 2021-04-29 MED ORDER — SODIUM CHLORIDE 0.9 % IV SOLN
2.0000 g | INTRAVENOUS | Status: DC
  Administered 2021-04-29 – 2021-04-30 (×2): 2 g via INTRAVENOUS
  Filled 2021-04-29: qty 2
  Filled 2021-04-29 (×2): qty 20

## 2021-04-29 MED ORDER — METOPROLOL SUCCINATE ER 50 MG PO TB24
50.0000 mg | ORAL_TABLET | Freq: Every morning | ORAL | Status: DC
  Administered 2021-04-30: 50 mg via ORAL
  Filled 2021-04-29: qty 1

## 2021-04-29 MED ORDER — TRANEXAMIC ACID-NACL 1000-0.7 MG/100ML-% IV SOLN
1000.0000 mg | INTRAVENOUS | Status: AC
  Administered 2021-04-29: 1000 mg via INTRAVENOUS
  Filled 2021-04-29: qty 100

## 2021-04-29 MED ORDER — OXYCODONE HCL 5 MG PO TABS: 5.0000 mg | ORAL_TABLET | Freq: Once | ORAL | Status: DC | PRN

## 2021-04-29 MED ORDER — METHOCARBAMOL 1000 MG/10ML IJ SOLN
500.0000 mg | Freq: Four times a day (QID) | INTRAVENOUS | Status: DC | PRN
  Filled 2021-04-29: qty 5

## 2021-04-29 MED ORDER — PANTOPRAZOLE SODIUM 40 MG PO TBEC
40.0000 mg | DELAYED_RELEASE_TABLET | Freq: Every day | ORAL | Status: DC
  Administered 2021-04-29 – 2021-04-30 (×2): 40 mg via ORAL
  Filled 2021-04-29 (×2): qty 1

## 2021-04-29 MED ORDER — SUGAMMADEX SODIUM 200 MG/2ML IV SOLN
INTRAVENOUS | Status: DC | PRN
  Administered 2021-04-29: 100 mg via INTRAVENOUS
  Administered 2021-04-29: 250 mg via INTRAVENOUS

## 2021-04-29 MED ORDER — DOCUSATE SODIUM 100 MG PO CAPS
100.0000 mg | ORAL_CAPSULE | Freq: Two times a day (BID) | ORAL | Status: DC
  Administered 2021-04-30: 100 mg via ORAL
  Filled 2021-04-29 (×2): qty 1

## 2021-04-29 MED ORDER — MIDAZOLAM HCL 2 MG/2ML IJ SOLN
INTRAMUSCULAR | Status: AC
  Filled 2021-04-29: qty 2

## 2021-04-29 MED ORDER — OXYCODONE HCL 5 MG PO TABS: 10.0000 mg | ORAL_TABLET | ORAL | Status: DC | PRN

## 2021-04-29 MED ORDER — CLONIDINE HCL 0.1 MG PO TABS: 0.1000 mg | ORAL_TABLET | Freq: Every day | ORAL | Status: DC | PRN

## 2021-04-29 MED ORDER — SUGAMMADEX SODIUM 500 MG/5ML IV SOLN
INTRAVENOUS | Status: AC
  Filled 2021-04-29: qty 5

## 2021-04-29 MED ORDER — SERTRALINE HCL 100 MG PO TABS: 100.0000 mg | ORAL_TABLET | Freq: Every morning | ORAL | Status: DC

## 2021-04-29 MED ORDER — METOLAZONE 2.5 MG PO TABS
2.5000 mg | ORAL_TABLET | Freq: Every day | ORAL | Status: DC | PRN
  Filled 2021-04-29: qty 1

## 2021-04-29 MED ORDER — PROPOFOL 10 MG/ML IV BOLUS
INTRAVENOUS | Status: AC
  Filled 2021-04-29: qty 20

## 2021-04-29 MED ORDER — VANCOMYCIN HCL 1 G IV SOLR
INTRAVENOUS | Status: DC | PRN
  Administered 2021-04-29: 1000 mg
  Administered 2021-04-29: 1000 mg via TOPICAL

## 2021-04-29 MED ORDER — MIDAZOLAM HCL 2 MG/2ML IJ SOLN
0.5000 mg | Freq: Once | INTRAMUSCULAR | Status: DC | PRN
Start: 2021-04-29 — End: 2021-04-29

## 2021-04-29 MED ORDER — HYDROMORPHONE HCL 1 MG/ML IJ SOLN: 0.5000 mg | INTRAMUSCULAR | Status: DC | PRN

## 2021-04-29 MED ORDER — LIDOCAINE 2% (20 MG/ML) 5 ML SYRINGE
INTRAMUSCULAR | Status: DC | PRN
  Administered 2021-04-29: 20 mg via INTRAVENOUS

## 2021-04-29 MED ORDER — ETOMIDATE 2 MG/ML IV SOLN: INTRAVENOUS | Status: DC | PRN

## 2021-04-29 MED ORDER — MAGNESIUM CITRATE PO SOLN: 1.0000 | Freq: Once | ORAL | Status: DC | PRN

## 2021-04-29 MED ORDER — RIVAROXABAN 10 MG PO TABS
20.0000 mg | ORAL_TABLET | Freq: Every morning | ORAL | Status: DC
  Administered 2021-04-30: 20 mg via ORAL
  Filled 2021-04-29: qty 2

## 2021-04-29 MED ORDER — ALLOPURINOL 100 MG PO TABS
100.0000 mg | ORAL_TABLET | Freq: Two times a day (BID) | ORAL | Status: DC
Start: 2021-04-29 — End: 2021-04-30
  Administered 2021-04-29 – 2021-04-30 (×2): 100 mg via ORAL
  Filled 2021-04-29 (×3): qty 1

## 2021-04-29 MED ORDER — POLYETHYLENE GLYCOL 3350 17 G PO PACK: 17.0000 g | PACK | Freq: Every day | ORAL | Status: DC | PRN

## 2021-04-29 MED ORDER — FENTANYL CITRATE (PF) 250 MCG/5ML IJ SOLN
INTRAMUSCULAR | Status: DC | PRN
  Administered 2021-04-29: 50 ug via INTRAVENOUS
  Administered 2021-04-29 (×2): 25 ug via INTRAVENOUS

## 2021-04-29 MED ORDER — VANCOMYCIN HCL 1000 MG/200ML IV SOLN
1000.0000 mg | Freq: Two times a day (BID) | INTRAVENOUS | Status: AC
  Administered 2021-04-29: 1000 mg via INTRAVENOUS
  Filled 2021-04-29: qty 200

## 2021-04-29 MED ORDER — STERILE WATER FOR IRRIGATION IR SOLN
Status: DC | PRN
  Administered 2021-04-29: 2000 mL

## 2021-04-29 SURGICAL SUPPLY — 61 items
BLADE SAW SAG 73X25 THK (BLADE)
BLADE SAW SGTL 73X25 THK (BLADE) IMPLANT
BONE CEMENT GENTAMICIN (Cement) ×4 IMPLANT
CEMENT BONE GENTAMICIN 40 (Cement) ×2 IMPLANT
CHLORAPREP W/TINT 26 (MISCELLANEOUS) ×2 IMPLANT
CLSR STERI-STRIP ANTIMIC 1/2X4 (GAUZE/BANDAGES/DRESSINGS) ×2 IMPLANT
COOLER ICEMAN CLASSIC (MISCELLANEOUS) IMPLANT
COVER BACK TABLE 60X90IN (DRAPES) ×2 IMPLANT
COVER SURGICAL LIGHT HANDLE (MISCELLANEOUS) ×2 IMPLANT
COVER WAND RF STERILE (DRAPES) ×2 IMPLANT
DRAPE INCISE IOBAN 66X45 STRL (DRAPES) ×4 IMPLANT
DRAPE ORTHO SPLIT 77X108 STRL (DRAPES) ×2
DRAPE SHEET LG 3/4 BI-LAMINATE (DRAPES) ×4 IMPLANT
DRAPE SURG ORHT 6 SPLT 77X108 (DRAPES) ×2 IMPLANT
DRSG AQUACEL AG ADV 3.5X 6 (GAUZE/BANDAGES/DRESSINGS) ×2 IMPLANT
DRSG PAD ABDOMINAL 8X10 ST (GAUZE/BANDAGES/DRESSINGS) ×4 IMPLANT
ELECT BLADE TIP CTD 4 INCH (ELECTRODE) ×2 IMPLANT
ELECT REM PT RETURN 15FT ADLT (MISCELLANEOUS) ×2 IMPLANT
EVACUATOR 1/8 PVC DRAIN (DRAIN) ×2 IMPLANT
FACESHIELD WRAPAROUND (MASK) ×2 IMPLANT
GAUZE SPONGE 4X4 12PLY STRL (GAUZE/BANDAGES/DRESSINGS) ×2 IMPLANT
GAUZE XEROFORM 1X8 LF (GAUZE/BANDAGES/DRESSINGS) ×2 IMPLANT
GLOVE SRG 8 PF TXTR STRL LF DI (GLOVE) ×1 IMPLANT
GLOVE SURG ENC MOIS LTX SZ6.5 (GLOVE) ×4 IMPLANT
GLOVE SURG LTX SZ8 (GLOVE) ×4 IMPLANT
GLOVE SURG UNDER POLY LF SZ6.5 (GLOVE) ×2 IMPLANT
GLOVE SURG UNDER POLY LF SZ8 (GLOVE) ×1
GOWN STRL REUS W/TWL LRG LVL3 (GOWN DISPOSABLE) ×2 IMPLANT
GOWN STRL REUS W/TWL XL LVL3 (GOWN DISPOSABLE) ×2 IMPLANT
HANDPIECE INTERPULSE COAX TIP (DISPOSABLE) ×1
HEMOSTAT SURGICEL 2X14 (HEMOSTASIS) IMPLANT
KIT BASIN OR (CUSTOM PROCEDURE TRAY) ×2 IMPLANT
KIT STABILIZATION SHOULDER (MISCELLANEOUS) ×2 IMPLANT
KIT TURNOVER KIT A (KITS) ×2 IMPLANT
MANIFOLD NEPTUNE II (INSTRUMENTS) ×2 IMPLANT
NEEDLE MAYO CATGUT SZ4 (NEEDLE) IMPLANT
NS IRRIG 1000ML POUR BTL (IV SOLUTION) ×2 IMPLANT
PACK SHOULDER (CUSTOM PROCEDURE TRAY) ×2 IMPLANT
PAD COLD SHLDR WRAP-ON (PAD) IMPLANT
PENCIL SMOKE EVACUATOR (MISCELLANEOUS) IMPLANT
RESTRAINT HEAD UNIVERSAL NS (MISCELLANEOUS) ×2 IMPLANT
SET HNDPC FAN SPRY TIP SCT (DISPOSABLE) ×1 IMPLANT
SLING ARM IMMOBILIZER LRG (SOFTGOODS) ×2 IMPLANT
SLING ULTRA II L (ORTHOPEDIC SUPPLIES) IMPLANT
SLING ULTRA III MED (ORTHOPEDIC SUPPLIES) IMPLANT
SUCTION FRAZIER HANDLE 12FR (TUBING) ×1
SUCTION TUBE FRAZIER 12FR DISP (TUBING) ×1 IMPLANT
SUT ETHIBOND 2 V 37 (SUTURE) IMPLANT
SUT ETHIBOND NAB CT1 #1 30IN (SUTURE) IMPLANT
SUT ETHILON 2 0 PS N (SUTURE) ×6 IMPLANT
SUT FIBERWIRE #5 38 CONV NDL (SUTURE)
SUT MNCRL AB 4-0 PS2 18 (SUTURE) ×2 IMPLANT
SUT PDS AB 2-0 CT2 27 (SUTURE) ×2 IMPLANT
SUT VIC AB 0 CT1 36 (SUTURE) IMPLANT
SUT VIC AB 3-0 SH 27 (SUTURE) ×1
SUT VIC AB 3-0 SH 27X BRD (SUTURE) ×1 IMPLANT
SUTURE FIBERWR #5 38 CONV NDL (SUTURE) IMPLANT
TAPE CLOTH SURG 4X10 WHT LF (GAUZE/BANDAGES/DRESSINGS) ×2 IMPLANT
TOWEL OR 17X26 10 PK STRL BLUE (TOWEL DISPOSABLE) ×2 IMPLANT
TUBE SUCTION HIGH CAP CLEAR NV (SUCTIONS) ×2 IMPLANT
WATER STERILE IRR 1000ML POUR (IV SOLUTION) ×4 IMPLANT

## 2021-04-29 NOTE — Anesthesia Procedure Notes (Signed)
Date/Time: 04/29/2021 9:39 AM Performed by: Cynda Familia, CRNA Pre-anesthesia Checklist: Patient identified, Suction available, Emergency Drugs available, Patient being monitored and Timeout performed Patient Re-evaluated:Patient Re-evaluated prior to induction Oxygen Delivery Method: Nasal cannula Placement Confirmation: positive ETCO2 and breath sounds checked- equal and bilateral Dental Injury: Teeth and Oropharynx as per pre-operative assessment

## 2021-04-29 NOTE — Op Note (Signed)
Orthopaedic Surgery Operative Note (CSN: [id number])  Willie Pineda  02/05/1946 Date of Surgery: 04/29/2021   Diagnoses:  Left septic shoulder recurrent  Procedure: Left antibiotic spacer Hemi  Arthroplasty Left shoulder synovectomy   Operative Finding Successful completion of planned procedure.  Patient did have signs of deep infection and a septic joint again with some early purulent formation.  There was no sign of superficial abscess.  This was much improvements previous surgery however he continues to fight infection in the shoulder.  We left a deep drain will be left for 24 hours at minimum and the patient will have an infectious disease consult to help with antibiotics.  He will need at least 3 to 4 months of lowering labs in order to eventually have a arthroplasty performed  Post-operative plan: The patient will be NWB in sling.  The patient will be will be admitted to observation due to medical complexity, monitoring and pain management.  DVT prophylaxis Aspirin 81 mg twice daily for 6 weeks.  Pain control with PRN pain medication preferring oral medicines.  Follow up plan will be scheduled in approximately 7 days for incision check and XR.  Physical therapy to start after first visit.  Implants: Cement spacer size 4 stem and 52 head, 23 tall  Post-Op Diagnosis: Same Surgeons:Primary: [name], MD Assistants:Caroline McBane PA-C Location: [facility] ROOM 09 Anesthesia: General with Exparel Interscalene Antibiotics: Ancef 2g preop, Vancomycin 1000mg  locally Tourniquet time: None Estimated Blood Loss: 893 Complications: None Specimens: 2 for culture hold for 3 weeks for C acnes Implants: Implant Name Type Inv. Item Serial No. Manufacturer Lot No. LRB No. Used Action  CEMENT BONE GENTAMICIN - [id number] Cement CEMENT BONE GENTAMICIN  [employer] [id number] Left 1 Implanted  CEMENT BONE GENTAMICIN - [id number] Cement CEMENT BONE GENTAMICIN  Dewitt Hoes [id number] Left 1  Implanted    Indications for Surgery:   Willie Pineda is a 75 y.o. male with septic native shoulder with irreparable cuff tear now status post I&D with antibiotic spacer placement.  He had worsening pain and MRI findings consistent with a septic joint with an aspiration positive for gram-negative rods.  benefits and risks of operative and nonoperative management were discussed prior to surgery with patient/guardian(s) and informed consent form was completed.  Infection and need for further surgery were discussed as was prosthetic stability and cuff issues.  We additionally specifically discussed risks of axillary nerve injury, infection, periprosthetic fracture, continued pain and longevity of implants prior to beginning procedure.      Procedure:   The patient was identified in the preoperative holding area where the surgical site was marked. Block placed by anesthesia with exparel.  The patient was taken to the OR where a procedural timeout was called and the above noted anesthesia was induced.  The patient was positioned beachchair on allen table with spider arm positioner.  Preoperative antibiotics were dosed.  The patient's left shoulder was prepped and draped in the usual sterile fashion.  A second preoperative timeout was called.       Deltopectoral approach used again.  Went through skin sharp achieving hemostasis we progressed.  We identified the deltopectoral interval and those scarred down were able to open and bluntly.  We retracted the cephalic vein laterally and opened the deep fascial layers.  That point we identified the conjoined tendon and placed blunt retractors to avoid damage to the musculotendinous nerve.  We able to elevate the tissue plane underneath the deltoid staying just on  bone and open the capsule.  There is purulent material noted.  We had a thick synovial capsule and this was resected and a complete synovectomy was performed using a Bovie and rondure.  We debrided this  deep capsule, bone, purulent material and fascia excisionally.  Once this was complete remove the antibiotic spacer and debrided the bone from the medullary canal.  Used a curette for this.  6 L of saline was used and interval debridements were performed until we had a clean wound bed.  We then placed a spacer as above with vancomycin gentamicin powder.  Placed local vancomycin powder before closing with minimal deep sutures in the form of PDS 2 oh and nylons in the skin.  We placed a drain, medium Hemovac into the deep space and noted that it was not sewed in.  Sterile dressing was placed.  Patient woken taken to PACU in stable condition in a sling.   Noemi Chapel, PA-C, present and scrubbed throughout the case, critical for completion in a timely fashion, and for retraction, instrumentation, closure.

## 2021-04-29 NOTE — Transfer of Care (Signed)
Immediate Anesthesia Transfer of Care Note  Patient: Willie Pineda  Procedure(s) Performed: Revision antibiotic spacer placement. Incision and drainage. (Left Shoulder)  Patient Location: PACU  Anesthesia Type:General  Level of Consciousness: sedated  Airway & Oxygen Therapy: Patient Spontanous Breathing and Patient connected to face mask oxygen  Post-op Assessment: Report given to RN and Post -op Vital signs reviewed and stable  Post vital signs: Reviewed and stable  Last Vitals:  Vitals Value Taken Time  BP 100/66 04/29/21 1131  Temp    Pulse 76 04/29/21 1133  Resp 22 04/29/21 1133  SpO2 98 % 04/29/21 1133  Vitals shown include unvalidated device data.  Last Pain:  Vitals:   04/29/21 0754  TempSrc:   PainSc: 6       Patients Stated Pain Goal: 4 (74/73/40 3709)  Complications: No complications documented.

## 2021-04-29 NOTE — Progress Notes (Signed)
Peripherally Inserted Central Catheter Placement  The IV Nurse has discussed with the patient and/or persons authorized to consent for the patient, the purpose of this procedure and the potential benefits and risks involved with this procedure.  The benefits include less needle sticks, lab draws from the catheter, and the patient may be discharged home with the catheter. Risks include, but not limited to, infection, bleeding, blood clot (thrombus formation), and puncture of an artery; nerve damage and irregular heartbeat and possibility to perform a PICC exchange if needed/ordered by physician.  Alternatives to this procedure were also discussed.  Bard Power PICC patient education guide, fact sheet on infection prevention and patient information card has been provided to patient /or left at bedside.   Telephone consent obtained from spouse and witnessed by Gerrianne Scale, RN due to recent anesthesia.  PICC Placement Documentation  PICC Single Lumen 27/06/23 Right Basilic 41 cm 0 cm (Active)  Indication for Insertion or Continuance of Line Home intravenous therapies (PICC only) 04/29/21 2200  Exposed Catheter (cm) 0 cm 04/29/21 2200  Site Assessment Clean;Dry;Intact 04/29/21 2200  Line Status Capped (central line);Flushed;Blood return noted 04/29/21 2200  Dressing Type Transparent;Securing device 04/29/21 2200  Dressing Status Clean;Dry;Intact 04/29/21 2200  Antimicrobial disc in place? Yes 04/29/21 2200  Safety Lock Not Applicable 76/28/31 5176  Line Care Connections checked and tightened 04/29/21 2200  Line Adjustment (NICU/IV Team Only) No 04/29/21 2200  Dressing Intervention New dressing 04/29/21 2200  Dressing Change Due 05/06/21 04/29/21 [place], Danielys Madry Chenice 04/29/2021, 10:18 PM

## 2021-04-29 NOTE — Interval H&P Note (Signed)
History and Physical Interval Note:  04/29/2021 9:33 AM  Willie Pineda  has presented today for surgery, with the diagnosis of septic left shoulder.  The various methods of treatment have been discussed with the patient and family. After consideration of risks, benefits and other options for treatment, the patient has consented to  Procedure(s): REVERSE SHOULDER ARTHROPLASTY (Left) as a surgical intervention.  The patient's history has been reviewed, patient examined, no change in status, stable for surgery.  I have reviewed the patient's chart and labs.  Questions were answered to the patient's satisfaction.     [name]

## 2021-04-29 NOTE — Anesthesia Procedure Notes (Signed)
Date/Time: 04/29/2021 11:18 AM Performed by: Cynda Familia, CRNA Oxygen Delivery Method: Simple face mask Placement Confirmation: positive ETCO2 and breath sounds checked- equal and bilateral Dental Injury: Teeth and Oropharynx as per pre-operative assessment

## 2021-04-29 NOTE — Consult Note (Signed)
[facility] for Infectious Disease       Reason for Consult: native shoulder joint infection    Referring Physician: Dr. Griffin Basil  Active Problems:   Septic joint of left shoulder region [facility])   . acetaminophen  1,000 mg Oral Q8H  . allopurinol  100 mg Oral BID  . [START ON 04/30/2021] atorvastatin  10 mg Oral QODAY  . docusate sodium  100 mg Oral BID  . gabapentin  900 mg Oral BID  . [START ON 04/30/2021] metoprolol succinate  50 mg Oral q AM  . pantoprazole  40 mg Oral Daily  . [START ON 04/30/2021] rivaroxaban  20 mg Oral q AM  . [START ON 04/30/2021] sacubitril-valsartan  1 tablet Oral BID  . [START ON 04/30/2021] sertraline  100 mg Oral q AM  . [START ON 04/30/2021] spironolactone  25 mg Oral Daily  . [START ON 04/30/2021] torsemide  100 mg Oral Daily    Recommendations: Ceftriaxone IV 2 grams daily for 3 weeks picc line - ordered  Assessment: He has persistent infection of his joint requiring debridment today and based on previous cultures and GNR will start ceftriaxone as above.  Will follow closely to see if he needs extension beyond 3 weeks and if new culture results grow.   penicillin allergy - anaphylaxis at 74 years of age.  Has tolerated ceftriaxone before without issue.   Willie Pineda for discharge from ID standpoint once picc line in  I will watch cultures after discharge and make adjustments as needed.   Antibiotics: Starting ceftriaxone  HPI: Willie Pineda is a 75 y.o. male with HTN, afib, hyperlipidemia, nonischemic cardiomyopathy, PVD, COPD, DM with a recent native joint shoulder infection with osteomyelitis, s/p 6 weeks of treatment with doxycycline.  Growth in culture of C acnes.  Completed 6 weeks but was having worsening pain of his shoulder and an MRI was repeated and concerning for continued infection.  Taken back to the OR today and some purulence noted and cleaned out by Dr. Griffin Basil.  REcent aspiration also with GNR, results not yet available.  New cultures  sent.  No fever or chills.   Review of Systems:  Constitutional: negative for fevers and chills Gastrointestinal: negative for nausea and diarrhea Integument/breast: negative for rash All other systems reviewed and are negative    Past Medical History:  Diagnosis Date  . Arthritis    "hands primarily" (05/05/2016)  . Atrial fibrillation ([place])   . Back pain    age related  . CHF (congestive heart failure) ([place])    no problemes since 2018  . Complication of anesthesia    Bronchial spasms  . COPD (chronic obstructive pulmonary disease) (H[facility])    inhalers as needed  . Coronary artery disease   . Depression    takes Zoloft daily  . Diabetic peripheral neuropathy ([place]) 01/03/2019  . Dysrhythmia    A Fib-takes Xarelto daily  . Enlarged prostate    benign  . GERD (gastroesophageal reflux disease)    takes Omeprazole daily  . 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 Hyperlipidemia    takes Atorvastatin daily  . Hypertension    takes Metoprolol,HCTZ,and Losartan daily.Takes Clonidine if needed  . Nocturia   . Peripheral neuropathy    takes Gabapentin daily  . Peripheral vascular disease (H[facility])    legs blockage   . Pneumonia ?2015  . Restless leg   . Sleep apnea  uses Bipap  . Stroke [facility]) 2012   denies residual on 05/05/2016    Social History   Tobacco Use  . Smoking status: Former Smoker    Packs/day: 0.50    Years: 40.00    Pack years: 20.00    Types: Cigarettes    Quit date: 07/15/2011    Years since quitting: 9.7  . Smokeless tobacco: Former Systems developer    Types: Secondary school teacher  . Vaping Use: Never used  Substance Use Topics  . Alcohol use: Yes    Comment: maybe 2 glases wine every other week  . Drug use: No    Family History  Problem Relation Age of Onset  . CVA Mother   . Liver cancer Father     Allergies  Allergen Reactions  . Penicillins Anaphylaxis    Reports an anaphylactic reaction when he was 13 to IM penicillin.   Has tolerated multiple cephalosporins in the past.   . Alfalfa   . Dymista [Azelastine-Fluticasone] Other (See Comments)    Burning sensation   . Flonase [Fluticasone Propionate] Other (See Comments)    burning  . Hydrocodone Other (See Comments)    SWEATING  . Warfarin And Related Other (See Comments)    Pt states it makes his INR critical levels    Physical Exam: Constitutional: in no apparent distress  Vitals:   04/29/21 1230 04/29/21 1240  BP: 97/70 115/77  Pulse: 63 66  Resp: 18 18  Temp:  98 F (36.7 C)  SpO2: 96% 93%   EYES: anicteric Cardiovascular: Cor RRR Respiratory: clear; Musculoskeletal: no pedal edema noted Skin: negatives: no rash Neuro: non-focal, limited movement of left shoulder  Lab Results  Component Value Date   WBC 11.5 (H) 04/27/2021   HGB 14.1 04/27/2021   HCT 42.5 04/27/2021   MCV 96.2 04/27/2021   PLT 286 04/27/2021    Lab Results  Component Value Date   CREATININE 1.13 04/27/2021   BUN 30 (H) 04/27/2021   NA 136 04/27/2021   K 3.7 04/27/2021   CL 101 04/27/2021   CO2 27 04/27/2021    Lab Results  Component Value Date   ALT 9 04/15/2021   AST 15 04/15/2021   ALKPHOS 100 03/25/2016     Microbiology: Recent Results (from the past 240 hour(s))  Surgical pcr screen     Status: None   Collection Time: 04/27/21  8:46 AM   Specimen: Nasal Mucosa; Nasal Swab  Result Value Ref Range Status   MRSA, PCR NEGATIVE NEGATIVE Final   Staphylococcus aureus NEGATIVE NEGATIVE Final    Comment: (NOTE) The Xpert SA Assay (FDA approved for NASAL specimens in patients 59 years of age and older), is one component of a comprehensive surveillance program. It is not intended to diagnose infection nor to guide or monitor treatment. Performed at [facility], [place] [address] [place], [place] [address]   SARS Coronavirus 2 by RT PCR (hospital order, performed in [facility] hospital lab) Nasopharyngeal Nasopharyngeal Swab      Status: None   Collection Time: 04/29/21  7:39 AM   Specimen: Nasopharyngeal Swab  Result Value Ref Range Status   SARS Coronavirus 2 NEGATIVE NEGATIVE Final    Comment: (NOTE) SARS-CoV-2 target nucleic acids are NOT DETECTED.  The SARS-CoV-2 RNA is generally detectable in upper and lower respiratory specimens during the acute phase of infection. The lowest concentration of SARS-CoV-2 viral copies this assay can detect is 250 copies / mL. A negative  result does not preclude SARS-CoV-2 infection and should not be used as the sole basis for treatment or other patient management decisions.  A negative result may occur with improper specimen collection / handling, submission of specimen other than nasopharyngeal swab, presence of viral mutation(s) within the areas targeted by this assay, and inadequate number of viral copies (<250 copies / mL). A negative result must be combined with clinical observations, patient history, and epidemiological information.  Fact Sheet for Patients:   StrictlyIdeas.no  Fact Sheet for Healthcare Providers: BankingDealers.co.za  This test is not yet approved or  cleared by the [place] FDA and has been authorized for detection and/or diagnosis of SARS-CoV-2 by FDA under an Emergency Use Authorization (EUA).  This EUA will remain in effect (meaning this test can be used) for the duration of the COVID-19 declaration under Section 564(b)(1) of the Act, 21 U.S.C. section 360bbb-3(b)(1), unless the authorization is terminated or revoked sooner.  Performed at [facility], [place] [address], [place], [place] [address]     Darryl Blumenstein W Clarity Ciszek, MD [facility] for Infectious Disease [facility] [email or web address] 04/29/2021, 3:31 PM

## 2021-04-29 NOTE — Anesthesia Postprocedure Evaluation (Signed)
Anesthesia Post Note  Patient: Willie Pineda  Procedure(s) Performed: Revision antibiotic spacer placement. Incision and drainage. (Left Shoulder)     Patient location during evaluation: PACU Anesthesia Type: General Level of consciousness: awake and alert, patient cooperative and oriented Pain management: pain level controlled Vital Signs Assessment: post-procedure vital signs reviewed and stable Respiratory status: spontaneous breathing, nonlabored ventilation and respiratory function stable Cardiovascular status: blood pressure returned to baseline and stable Postop Assessment: no apparent nausea or vomiting Anesthetic complications: no   No complications documented.  Last Vitals:  Vitals:   04/29/21 1230 04/29/21 1240  BP: 97/70 115/77  Pulse: 63 66  Resp: 18 18  Temp:  36.7 C  SpO2: 96% 93%    Last Pain:  Vitals:   04/29/21 1240  TempSrc: Oral  PainSc: 0-No pain                 Porfiria Heinrich,E. Amato [place]

## 2021-04-29 NOTE — Anesthesia Procedure Notes (Signed)
Date/Time: 04/29/2021 11:20 AM Performed by: Cynda Familia, CRNA Oxygen Delivery Method: Simple face mask Placement Confirmation: breath sounds checked- equal and bilateral and positive ETCO2 Dental Injury: Teeth and Oropharynx as per pre-operative assessment

## 2021-04-29 NOTE — Progress Notes (Signed)
PHARMACY CONSULT NOTE FOR:  OUTPATIENT  PARENTERAL ANTIBIOTIC THERAPY (OPAT)  Indication: Shoulder infection  Regimen: Ceftriaxone 2 gm IV Q 24 hours  End date:  05/20/21  IV antibiotic discharge orders are pended. To discharging provider:  please sign these orders via discharge navigator,  Select New Orders & click on the button choice - Manage This Unsigned Work.     Thank you for allowing pharmacy to be a part of this patient's care.  Jimmy Footman, PharmD, BCPS, BCIDP Infectious Diseases Clinical Pharmacist Phone: [phone] 04/29/2021, 3:33 PM

## 2021-04-29 NOTE — Anesthesia Procedure Notes (Signed)
Anesthesia Regional Block: Interscalene brachial plexus block   Pre-Anesthetic Checklist: ,, timeout performed, Correct Patient, Correct Site, Correct Laterality, Correct Procedure, Correct Position, site marked, Risks and benefits discussed,  Surgical consent,  Pre-op evaluation,  At surgeon's request and post-op pain management  Laterality: Left and Upper  Prep: chloraprep       Needles:  Injection technique: Single-shot  Needle Type: Echogenic Needle     Needle Length: 9cm  Needle Gauge: 21     Additional Needles:   Procedures:,,,, ultrasound used (permanent image in chart),,,,  Narrative:  Start time: 04/29/2021 9:41 AM End time: 04/29/2021 9:48 AM Injection made incrementally with aspirations every 5 mL.  Performed by: Personally  Anesthesiologist: Annye Asa, MD  Additional Notes: Pt identified in Holding room.  Monitors applied. Working IV access confirmed. Sterile prep L clavicle and neck.  #21ga ECHOgenic Arrow block needle to interscalene brachial plexus with US guidance.  15cc 0.5% Bupivacaine with 1:200k epi, Exparel injected incrementally after negative test dose.  Patient asymptomatic, VSS, no heme aspirated, tolerated well.  Jenita Seashore, MD

## 2021-04-29 NOTE — Progress Notes (Signed)
Patient does not wish to utilize nocturnal BiPAP tonight. Equipment remains clean and covered in the room in the event he should change his mind. He is aware that he may call for assistance at any time.

## 2021-04-29 NOTE — Anesthesia Procedure Notes (Signed)
Procedure Name: Intubation Date/Time: 04/29/2021 10:02 AM Performed by: Cynda Familia, CRNA Pre-anesthesia Checklist: Patient identified, Emergency Drugs available, Suction available and Patient being monitored Patient Re-evaluated:Patient Re-evaluated prior to induction Oxygen Delivery Method: Circle System Utilized Preoxygenation: Pre-oxygenation with 100% oxygen Induction Type: IV induction and Cricoid Pressure applied Ventilation: Mask ventilation without difficulty Laryngoscope Size: Miller and 2 Grade View: Grade I Tube type: Oral Tube size: 7.5 mm Number of attempts: 1 Airway Equipment and Method: Stylet Placement Confirmation: ETT inserted through vocal cords under direct vision,  positive ETCO2 and breath sounds checked- equal and bilateral Secured at: 23 cm Tube secured with: Tape Dental Injury: Teeth and Oropharynx as per pre-operative assessment  Comments: Smooth IV induction Glennon Mac-- intubation AM CRNA atraumatic-- chipping present on front teeth- unchanged with laryngoscopy- bilat BS Glennon Mac

## 2021-04-30 ENCOUNTER — Encounter (HOSPITAL_COMMUNITY): Payer: Self-pay | Admitting: Orthopaedic Surgery

## 2021-04-30 DIAGNOSIS — I509 Heart failure, unspecified: Secondary | ICD-10-CM | POA: Diagnosis not present

## 2021-04-30 DIAGNOSIS — Z79899 Other long term (current) drug therapy: Secondary | ICD-10-CM | POA: Diagnosis not present

## 2021-04-30 DIAGNOSIS — Z87891 Personal history of nicotine dependence: Secondary | ICD-10-CM | POA: Diagnosis not present

## 2021-04-30 DIAGNOSIS — Z96653 Presence of artificial knee joint, bilateral: Secondary | ICD-10-CM | POA: Diagnosis not present

## 2021-04-30 DIAGNOSIS — B955 Unspecified streptococcus as the cause of diseases classified elsewhere: Secondary | ICD-10-CM | POA: Diagnosis not present

## 2021-04-30 DIAGNOSIS — J449 Chronic obstructive pulmonary disease, unspecified: Secondary | ICD-10-CM | POA: Diagnosis not present

## 2021-04-30 DIAGNOSIS — I110 Hypertensive heart disease with heart failure: Secondary | ICD-10-CM | POA: Diagnosis not present

## 2021-04-30 DIAGNOSIS — M00212 Other streptococcal arthritis, left shoulder: Secondary | ICD-10-CM | POA: Diagnosis not present

## 2021-04-30 DIAGNOSIS — Z20822 Contact with and (suspected) exposure to covid-19: Secondary | ICD-10-CM | POA: Diagnosis not present

## 2021-04-30 DIAGNOSIS — Z7901 Long term (current) use of anticoagulants: Secondary | ICD-10-CM | POA: Diagnosis not present

## 2021-04-30 DIAGNOSIS — Z96612 Presence of left artificial shoulder joint: Secondary | ICD-10-CM | POA: Diagnosis not present

## 2021-04-30 DIAGNOSIS — I4891 Unspecified atrial fibrillation: Secondary | ICD-10-CM | POA: Diagnosis not present

## 2021-04-30 DIAGNOSIS — Z7984 Long term (current) use of oral hypoglycemic drugs: Secondary | ICD-10-CM | POA: Diagnosis not present

## 2021-04-30 DIAGNOSIS — E1140 Type 2 diabetes mellitus with diabetic neuropathy, unspecified: Secondary | ICD-10-CM | POA: Diagnosis not present

## 2021-04-30 DIAGNOSIS — I2510 Atherosclerotic heart disease of native coronary artery without angina pectoris: Secondary | ICD-10-CM | POA: Diagnosis not present

## 2021-04-30 LAB — BASIC METABOLIC PANEL
Anion gap: 9 (ref 5–15)
BUN: 28 mg/dL — ABNORMAL HIGH (ref 8–23)
CO2: 27 mmol/L (ref 22–32)
Calcium: 8.9 mg/dL (ref 8.9–10.3)
Chloride: 101 mmol/L (ref 98–111)
Creatinine, Ser: 0.87 mg/dL (ref 0.61–1.24)
GFR, Estimated: >60 mL/min (ref >60)
Glucose, Bld: 159 mg/dL — ABNORMAL HIGH (ref 70–99)
Potassium: 4.1 mmol/L (ref 3.5–5.1)
Sodium: 137 mmol/L (ref 135–145)

## 2021-04-30 LAB — CBC
HCT: 40.5 % (ref 39.0–52.0)
Hemoglobin: 13.3 g/dL (ref 13.0–17.0)
MCH: 32.6 pg (ref 26.0–34.0)
MCHC: 32.8 g/dL (ref 30.0–36.0)
MCV: 99.3 fL (ref 80.0–100.0)
Platelets: 260 10*3/uL (ref 150–400)
RBC: 4.08 MIL/uL — ABNORMAL LOW (ref 4.22–5.81)
RDW: 14.1 % (ref 11.5–15.5)
WBC: 14.4 10*3/uL — ABNORMAL HIGH (ref 4.0–10.5)
nRBC: 0 % (ref 0.0–0.2)

## 2021-04-30 LAB — SEDIMENTATION RATE: Sed Rate: 32 mm/hr — ABNORMAL HIGH (ref 0–16)

## 2021-04-30 LAB — GLUCOSE, CAPILLARY
Glucose-Capillary: 147 mg/dL — ABNORMAL HIGH (ref 70–99)
Glucose-Capillary: 157 mg/dL — ABNORMAL HIGH (ref 70–99)

## 2021-04-30 LAB — C-REACTIVE PROTEIN: CRP: 1.1 mg/dL — ABNORMAL HIGH (ref <1.0)

## 2021-04-30 MED ORDER — CEFTRIAXONE IV (FOR PTA / DISCHARGE USE ONLY): 2.0000 g | INTRAVENOUS | 0 refills | Status: AC

## 2021-04-30 MED ORDER — HEPARIN SOD (PORK) LOCK FLUSH 100 UNIT/ML IV SOLN
250.0000 [IU] | INTRAVENOUS | Status: AC | PRN
  Administered 2021-04-30: 250 [IU]
  Filled 2021-04-30: qty 2.5

## 2021-04-30 MED ORDER — OXYCODONE HCL 5 MG PO TABS: ORAL_TABLET | ORAL | 0 refills | Status: AC

## 2021-04-30 MED ORDER — ACETAMINOPHEN 500 MG PO TABS: 1000.0000 mg | ORAL_TABLET | Freq: Three times a day (TID) | ORAL | 0 refills | Status: AC

## 2021-04-30 NOTE — TOC Initial Note (Signed)
Transition of Care [facility]) - Initial/Assessment Note   Patient Details  Name: Willie Pineda MRN: [id number] Date of Birth: 05-Apr-1946  Transition of Care [facility]) CM/SW Contact:    Sherie Don, LCSW Phone Number: 04/30/2021, 11:26 AM  Clinical Narrative: Patient is expected to discharge home with [facility] and IV antibiotics. CSW made [facility] referrals to local [facility] agencies and patient was decline by the following:  [facility]: declined [facility]: no RN availability [facility]: declined as no Therapist, sports availability for IV antibiotics [facility]: no RN in [place]: declined  Pam with [facility] to set up [facility] with [place] as [facility] will handle the IV antibiotics. CSW called and updated patient's wife.  Expected Discharge Plan: [place] Barriers to Discharge: No [place] will accept this patient,Continued Medical Work up   Patient Goals and CMS Choice CMS Medicare.gov Compare Post Acute Care list provided to:: Patient Choice offered to / list presented to : Patient  Expected Discharge Plan and Services Expected Discharge Plan: [place] In-house Referral: Clinical Social Work Post Acute Care Choice: [place] arrangements for the past 2 months: [place] Expected Discharge Date: 04/30/21               DME Arranged: N/A DME Agency: NA HH Arranged: RN [place] Agency: Other - See comment,[employer] Personnel officer) Date HH Agency Contacted: 04/30/21 Representative spoke with at [place]: [place]  Prior Living Arrangements/Services Living arrangements for the past 2 months: [place] Lives with:: Spouse Patient language and need for interpreter reviewed:: Yes Do you feel safe going back to the place where you live?: Yes      Need for Family Participation in Patient Care: No (Comment) Care giver support system in place?: Yes (comment) Criminal Activity/Legal Involvement Pertinent to Current Situation/Hospitalization: No - Comment  as needed  Activities of Daily Living Home Assistive Devices/Equipment: CBG Meter ADL Screening (condition at time of admission) Patient's cognitive ability adequate to safely complete daily activities?: Yes Is the patient deaf or have difficulty hearing?: Yes Does the patient have difficulty seeing, even when wearing glasses/contacts?: No Does the patient have difficulty concentrating, remembering, or making decisions?: No Patient able to express need for assistance with ADLs?: Yes Does the patient have difficulty dressing or bathing?: No Independently performs ADLs?: Yes (appropriate for developmental age) Does the patient have difficulty walking or climbing stairs?: No Weakness of Legs: None Weakness of Arms/Hands: Left  Permission Sought/Granted Permission sought to share information with : Other (comment)  Emotional Assessment Appearance:: Appears stated age Orientation: : Oriented to Self,Oriented to Place,Oriented to  Time,Oriented to Situation Alcohol / Substance Use: Not Applicable Psych Involvement: No (comment)  Admission diagnosis:  Septic joint of left shoulder region [facility]) [M00.9] Patient Active Problem List   Diagnosis Date Noted  . Septic joint of left shoulder region ([place]) 04/29/2021  . Osteomyelitis of second toe of left foot ([place])   . Penicillin allergy   . Chronic osteomyelitis ([place])   . Diabetic foot infection ([place])   . S/p total knee replacement, bilateral   . Septic arthritis of shoulder, left ([place]) 03/03/2021  . Atrial fibrillation, chronic ([place]) 03/03/2021  . Chronic anticoagulation 03/03/2021  . DM (diabetes mellitus), type 2 with neurological complications ([place]) 63/87/5643  . Essential hypertension 03/03/2021  . Hypokalemia 03/03/2021  . Prolonged QT interval 03/03/2021  . COPD (chronic obstructive pulmonary disease) ([facility]) 03/03/2021  . Chronic CHF (congestive heart failure) ([place]) 03/03/2021  . Diabetic  peripheral neuropathy ([place]) 01/03/2019  .  Obesity 06/01/2016  . Wound dehiscence 05/04/2016  . Knee osteoarthritis 04/06/2016   PCP:  Josetta Huddle, MD Pharmacy:   [facility] [place], [place] AT [address] [place] [address] Phone: [phone] Fax: [phone]  Readmission Risk Interventions No flowsheet data found.

## 2021-04-30 NOTE — Plan of Care (Signed)
Plan of care reviewed and discussed with the patient. 

## 2021-04-30 NOTE — Progress Notes (Signed)
OT Cancellation Note  Patient Details Name: Willie Pineda MRN: [id number] DOB: [birth year]   Cancelled Treatment:    Reason Eval/Treat Not Completed: OT screened, no needs identified, will sign off. Patient familiar with all shoulder instructions, education, precautions and sling management from prior surgery. Patient reports no new needs. OT will sign off.  Icie Kuznicki L Kreig [place] 04/30/2021, 10:10 AM

## 2021-04-30 NOTE — Progress Notes (Signed)
   ORTHOPAEDIC PROGRESS NOTE  s/p Procedure(s): Revision antibiotic spacer placement with Incision and drainage on 04/29/21  SUBJECTIVE: Reports mild pain about operative site. No chest pain. No SOB. No nausea/vomiting. No other complaints.  OBJECTIVE: PE: General: NAD, sitting up in hospital bed Left upper extremity: bulky dressing CDI. hemovac in place with less 50 cc in canister. Distal motor and sensory function intact. Warm well perfused hand.  Vitals:   04/30/21 0101 04/30/21 0536  BP: 121/70 (!) 145/78  Pulse: 72 68  Resp: 16 17  Temp: 98.7 F (37.1 C) 97.6 F (36.4 C)  SpO2: 90% 97%     ASSESSMENT: Willie Pineda is a 75 y.o. male doing well postoperatively. POD#1  PLAN: Weightbearing: NWB LUE Insicional and dressing care: Reinforce dressings as needed Orthopedic device(s): Sling Showering: Post-op day #3 VTE prophylaxis: Resume xarelto Pain control: PRN pain medications, preferring oral medications Follow - up plan: 1 week in office Dispo: Home with his wife likely today. PICC line placed last night. Requested TOC consultation to help set up home health aid for the PICC line. ID has consulted on the patient as well and stated okay to discharge from ID standpoint after picc line placement.  Contact information:  Dr. [name], Noemi Chapel PA-C , After hours and holidays please check [email or web address] for group call information for [facility]   Noemi Chapel, PA-C 04/30/2021

## 2021-04-30 NOTE — Discharge Instructions (Signed)
[name] MD, MPH Noemi Chapel, PA-C [facility] [place] [address], [address] [phone] (tel)   [phone] (fax)   POST-OPERATIVE INSTRUCTIONS - TOTAL SHOULDER REPLACEMENT    WOUND CARE . You may remove the Operative Dressing on Post-Op Day #3 (72hrs after surgery). . You may remove the hemovac on Friday if there is not a significant amount of drainage in the canister - Gently pull on the plastic tubing.  -  There will be more drainage from this area once the hemovac is removed - This is to be expected - Keep this covered with a pressure bandage (guaze, ABD, tape) - Any concerns please call our office . Alternatively if you would like you can leave dressing on until follow-up if within 7-8 days but keep it dry. . Leave steri-strips in place until they fall off on their own, usually 2 weeks postop. . There may be a small amount of fluid/bleeding leaking at the surgical site.  o This is normal; the shoulder is filled with fluid during the procedure and can leak for 24-48hrs after surgery.  . You may change/reinforce the bandage as needed.  . Use the Cryocuff or Ice as often as possible for the first 7 days, then as needed for pain relief. Always keep a towel, ACE wrap or other barrier between the cooling unit and your skin.     SHOWERING: - You may shower on Post-Op Day #3 - Gently pat the area dry.  - Do not soak the shoulder in water or submerge it.  - You may remove the sling for showering, but keep a water resistant pillow under the arm to keep both the  elbow and shoulder away from the body (mimicking the abduction sling).  - Gently pat the area dry.  - Do not soak the shoulder in water. Do not go swimming in the pool or ocean until your sutures are removed. - KEEP THE INCISIONS CLEAN AND DRY.  EXERCISES ? Wear the sling at all times except when doing your exercises.  ? You may remove the sling for showering, but keep the arm across the chest or in  a secondary sling.    ? Accidental/Purposeful External Rotation and shoulder flexion (reaching behind you) is to be avoided at all costs for the first month. ? It is ok to come out of your sling if your are sitting and have assistance for eating.   ? Do not lift anything heavier than 1 pound until we discuss it further in clinic. ? Please perform the exercises:   . Elbow / Hand / Wrist  Range of Motion Exercises . Grip strengthening   REGIONAL ANESTHESIA (NERVE BLOCKS) . The anesthesia team may have performed a nerve block for you if safe in the setting of your care.  This is a great tool used to minimize pain.  Typically the block may start wearing off overnight but the long acting medicine may last for 3-4 days.  The nerve block wearing off can be a challenging period but please utilize your as needed pain medications to try and manage this period.    POST-OP MEDICATIONS- Multimodal approach to pain control . In general your pain will be controlled with a combination of substances.  Prescriptions unless otherwise discussed are electronically sent to your pharmacy.  This is a carefully made plan we use to minimize narcotic use.      ? Acetaminophen - Non-narcotic pain medicine taken on a scheduled basis  ?  Oxycodone - This is a strong narcotic, to be used only on an "as needed" basis for severe pain.   FOLLOW-UP ? If you develop a Fever (>101.5), Redness or Drainage from the surgical incision site, please call our office to arrange for an evaluation. ? Please call the office to schedule a follow-up appointment for a wound check, 7-10 days post-operatively.  IF YOU HAVE ANY QUESTIONS, PLEASE FEEL FREE TO CALL OUR OFFICE.  HELPFUL INFORMATION  . If you had a block, it will wear off between 8-24 hrs postop typically.  This is period when your pain may go from nearly zero to the pain you would have had post-op without the block.  This is an abrupt transition but nothing dangerous is  happening.  You may take an extra dose of narcotic when this happens.  ? Your arm will be in a sling following surgery. You will be in this sling for the next 4 weeks.  I will let you know the exact duration at your follow-up visit.  ? You may be more comfortable sleeping in a semi-seated position the first few nights following surgery.  Keep a pillow propped under the elbow and forearm for comfort.  If you have a recliner type of chair it might be beneficial.  If not that is fine too, but it would be helpful to sleep propped up with pillows behind your operated shoulder as well under your elbow and forearm.  This will reduce pulling on the suture lines.  ? When dressing, put your operative arm in the sleeve first.  When getting undressed, take your operative arm out last.  Loose fitting, button-down shirts are recommended.  ? In most states it is against the law to drive while your arm is in a sling. And certainly against the law to drive while taking narcotics.  ? You may return to work/school in the next couple of days when you feel up to it. Desk work and typing in the sling is fine.  ? We suggest you use the pain medication the first night prior to going to bed, in order to ease any pain when the anesthesia wears off. You should avoid taking pain medications on an empty stomach as it will make you nauseous.  ? Do not drink alcoholic beverages or take illicit drugs when taking pain medications.  ? Pain medication may make you constipated.  Below are a few solutions to try in this order: - Decrease the amount of pain medication if you aren't having pain. - Drink lots of decaffeinated fluids. - Drink prune juice and/or each dried prunes  o If the first 3 don't work start with additional solutions - Take Colace - an over-the-counter stool softener - Take Senokot - an over-the-counter laxative - Take Miralax - a stronger over-the-counter laxative   Dental Antibiotics:  In most cases  prophylactic antibiotics for Dental procdeures after total joint surgery are not necessary.  Exceptions are as follows:  1. History of prior total joint infection  2. Severely immunocompromised (Organ Transplant, cancer chemotherapy, Rheumatoid biologic meds such as [place])  3. Poorly controlled diabetes (A1C &gt; 8.0, blood glucose over 200)  If you have one of these conditions, contact your surgeon for an antibiotic prescription, prior to your dental procedure.   For more information including helpful videos and documents visit our website:   [email or web address]

## 2021-05-01 DIAGNOSIS — M8660 Other chronic osteomyelitis, unspecified site: Secondary | ICD-10-CM | POA: Diagnosis not present

## 2021-05-01 NOTE — Discharge Summary (Addendum)
Patient ID: AARIZ MAISH MRN: [id number] DOB/AGE: 1946/11/15 75 y.o.  Admit date: 04/29/2021 Discharge date: 04/30/2021  Admission Diagnoses: Septic left shoulder  Discharge Diagnoses:  Active Problems:   Septic joint of left shoulder region [facility])   Past Medical History:  Diagnosis Date  . Arthritis    "hands primarily" (05/05/2016)  . Atrial fibrillation ([place])   . Back pain    age related  . CHF (congestive heart failure) ([place])    no problemes since 2018  . Complication of anesthesia    Bronchial spasms  . COPD (chronic obstructive pulmonary disease) ([facility])    inhalers as needed  . Coronary artery disease   . Depression    takes Zoloft daily  . Diabetic peripheral neuropathy ([place]) 01/03/2019  . Dysrhythmia    A Fib-takes Xarelto daily  . Enlarged prostate    benign  . GERD (gastroesophageal reflux disease)    takes Omeprazole daily  . 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 Hyperlipidemia    takes Atorvastatin daily  . Hypertension    takes Metoprolol,HCTZ,and Losartan daily.Takes Clonidine if needed  . Nocturia   . Peripheral neuropathy    takes Gabapentin daily  . Peripheral vascular disease (H[facility])    legs blockage   . Pneumonia ?2015  . Restless leg   . Sleep apnea    uses Bipap  . Stroke [facility]) 2012   denies residual on 05/05/2016    Procedures Performed:  - Left antibiotic spacer Hemi  Arthroplasty - Left shoulder synovectomy  Discharged Condition: good/stable  Hospital Course: Patient brought in for scheduled procedure.  He tolerated procedure well.  He was kept for monitoring overnight for pain control, ID consultation, and medical monitoring postop. He was found to be stable for DC home the morning after surgery. PICC line was placed night after surgery for IV antibiotics. Patient was instructed on specific activity restrictions and all questions were answered.  Consults: Infectious Disease  Significant  Diagnostic Studies: Cultures  Treatments: Surgery, PICC line placement for IV antibiotics  Discharge Exam: General: NAD, sitting up in hospital bed Left upper extremity: bulky dressing CDI. hemovac in place with less 50 cc in canister. Distal motor and sensory function intact. Warm well perfused hand.  Disposition: Discharge disposition: 01-Home or Self Care       Discharge Instructions    [facility] pharmacist to adjust dose for Vancomycin, Aminoglycosides and other anti-infective therapies as requested by physician.   Complete by: As directed    [facility] to provide Cath Flo 2mg    Complete by: As directed    Administer for PICC line occlusion and as ordered by physician for other access device issues.   Anaphylaxis Kit: Provided to treat any anaphylactic reaction to the medication being provided to the patient if First Dose or when requested by physician   Complete by: As directed    Epinephrine 1mg /ml vial / amp: Administer 0.3mg  (0.64ml) subcutaneously once for moderate to severe anaphylaxis, nurse to call physician and pharmacy when reaction occurs and call 911 if needed for immediate care   Diphenhydramine 50mg /ml IV vial: Administer 25-50mg  IV/IM PRN for first dose reaction, rash, itching, mild reaction, nurse to call physician and pharmacy when reaction occurs   Sodium Chloride 0.9% NS 514ml IV: Administer if needed for hypovolemic blood pressure drop or as ordered by physician after call to physician with anaphylactic reaction   Call  MD for:  redness, tenderness, or signs of infection (pain, swelling, redness, odor or green/yellow discharge around incision site)   Complete by: As directed    Call MD for:  redness, tenderness, or signs of infection (pain, swelling, redness, odor or green/yellow discharge around incision site)   Complete by: As directed    Call MD for:  severe uncontrolled pain   Complete by: As directed    Call MD for:  severe  uncontrolled pain   Complete by: As directed    Call MD for:  temperature >100.4   Complete by: As directed    Call MD for:  temperature >100.4   Complete by: As directed    Change dressing on IV access line weekly and PRN   Complete by: As directed    Diet - low sodium heart healthy   Complete by: As directed    Flush IV access with Sodium Chloride 0.9% and Heparin 10 units/ml or 100 units/ml   Complete by: As directed    Home infusion instructions - [facility]   Complete by: As directed    Instructions: Flush IV access with Sodium Chloride 0.9% and Heparin 10units/ml or 100units/ml   Change dressing on IV access line: Weekly and PRN   Instructions Cath Flo $Remove'2mg'[id number]$ : Administer for PICC Line occlusion and as ordered by physician for other access device   [facility] pharmacist to adjust dose for: Vancomycin, Aminoglycosides and other anti-infective therapies as requested by physician   Method of administration may be changed at the discretion of home infusion pharmacist based upon assessment of the patient and/or caregiver's ability to self-administer the medication ordered   Complete by: As directed      Allergies as of 04/30/2021      Reactions   Penicillins Anaphylaxis   Reports an anaphylactic reaction when he was 13 to IM penicillin.  Has tolerated multiple cephalosporins in the past.  Tolerated Cephalosporin Date: 04/30/21.   Alfalfa    Dymista [azelastine-fluticasone] Other (See Comments)   Burning sensation    Flonase [fluticasone Propionate] Other (See Comments)   burning   Hydrocodone Other (See Comments)   SWEATING   Warfarin And Related Other (See Comments)   Pt states it makes his INR critical levels      Medication List    STOP taking these medications   doxycycline 100 MG tablet Commonly known as: VIBRA-TABS   oxyCODONE-acetaminophen 5-325 MG tablet Commonly known as: Percocet   traMADol 50 MG tablet Commonly known as: ULTRAM      TAKE these medications   acetaminophen 500 MG tablet Commonly known as: TYLENOL Take 2 tablets (1,000 mg total) by mouth every 8 (eight) hours for 14 days. What changed:   how much to take  when to take this  reasons to take this   allopurinol 100 MG tablet Commonly known as: ZYLOPRIM Take 1 tablet (100 mg total) by mouth 2 (two) times daily.   atorvastatin 10 MG tablet Commonly known as: LIPITOR Take 10 mg by mouth every other day. In the evening   carboxymethylcellulose 0.5 % Soln Commonly known as: REFRESH PLUS Place 1 drop into both eyes 3 (three) times daily as needed (dry/irritated eyes).   cefTRIAXone  IVPB Commonly known as: ROCEPHIN Inject 2 g into the vein daily for 21 days. Indication: Shoulder infection  First Dose: Yes Last Day of Therapy:  05/20/21 Labs - Once weekly:  CBC/D and BMP, Labs - Every other week:  ESR  and CRP Method of administration: IV Push Method of administration may be changed at the discretion of home infusion pharmacist based upon assessment of the patient and/or caregiver's ability to self-administer the medication ordered.   cloNIDine 0.1 MG tablet Commonly known as: CATAPRES Take 0.1 mg by mouth daily as needed (Only takes if pt if bp  > 160).   colchicine 0.6 MG tablet Take 1 tablet (0.6 mg total) by mouth daily. As needed for gout pain. What changed:   when to take this  reasons to take this  additional instructions   cyclobenzaprine 10 MG tablet Commonly known as: FLEXERIL Take 10 mg by mouth at bedtime as needed for muscle spasms.   diclofenac Sodium 1 % Gel Commonly known as: VOLTAREN Apply 2 g topically 2 (two) times daily as needed (pain).   Entresto 24-26 MG Generic drug: sacubitril-valsartan Take 1 tablet by mouth 2 (two) times daily.   gabapentin 300 MG capsule Commonly known as: NEURONTIN Take 900 mg by mouth 2 (two) times daily.   metolazone 2.5 MG tablet Commonly known as: ZAROXOLYN Take 2.5 mg by  mouth daily as needed (fluid retention).   metoprolol succinate 50 MG 24 hr tablet Commonly known as: TOPROL-XL Take 50 mg by mouth in the morning. Take with or immediately following a meal.   nitroGLYCERIN 0.2 mg/hr patch Commonly known as: NITRODUR - Dosed in mg/24 hr Place 1 patch (0.2 mg total) onto the skin daily. What changed:   when to take this  additional instructions   omeprazole 20 MG capsule Commonly known as: PRILOSEC Take 20 mg by mouth in the morning and at bedtime.   oxyCODONE 5 MG immediate release tablet Commonly known as: Oxy IR/ROXICODONE Take 1-2 pills every 6 hrs as needed for severe pain, no more than 6 per day   Ozempic (0.25 or 0.5 MG/DOSE) 2 MG/1.5ML Sopn Generic drug: Semaglutide(0.25 or 0.5MG /DOS) Inject 1 mg into the skin every Monday.   potassium chloride 10 MEQ CR capsule Commonly known as: MICRO-K Take 10 mEq by mouth in the morning.   rivaroxaban 20 MG Tabs tablet Commonly known as: XARELTO Take 20 mg by mouth in the morning.   sertraline 100 MG tablet Commonly known as: ZOLOFT Take 100 mg by mouth in the morning.   spironolactone 25 MG tablet Commonly known as: ALDACTONE Take 25 mg by mouth in the morning.   torsemide 100 MG tablet Commonly known as: DEMADEX Take 100 mg by mouth in the morning.   Vitamin B 12 500 MCG Tabs Take 500 mcg by mouth in the morning.            Discharge Care Instructions  (From admission, onward)         Start     Ordered   04/30/21 0000  Change dressing on IV access line weekly and PRN  (Home infusion instructions - [facility] )        04/30/21 0922          Follow-up Information    [facility] Follow up.   Why: IV antibiotics             Noemi Chapel, PA-C 05/01/2021

## 2021-05-02 DIAGNOSIS — M8660 Other chronic osteomyelitis, unspecified site: Secondary | ICD-10-CM | POA: Diagnosis not present

## 2021-05-04 LAB — AEROBIC/ANAEROBIC CULTURE W GRAM STAIN (SURGICAL/DEEP WOUND)
Culture: NO GROWTH
Culture: NO GROWTH

## 2021-05-06 DIAGNOSIS — M01X19 Direct infection of unspecified shoulder in infectious and parasitic diseases classified elsewhere: Secondary | ICD-10-CM | POA: Diagnosis not present

## 2021-05-06 DIAGNOSIS — Z7689 Persons encountering health services in other specified circumstances: Secondary | ICD-10-CM | POA: Diagnosis not present

## 2021-05-06 DIAGNOSIS — M8660 Other chronic osteomyelitis, unspecified site: Secondary | ICD-10-CM | POA: Diagnosis not present

## 2021-05-07 DIAGNOSIS — M19012 Primary osteoarthritis, left shoulder: Secondary | ICD-10-CM | POA: Diagnosis not present

## 2021-05-08 ENCOUNTER — Ambulatory Visit (INDEPENDENT_AMBULATORY_CARE_PROVIDER_SITE_OTHER): Payer: Medicare Other | Admitting: Physician Assistant

## 2021-05-08 ENCOUNTER — Encounter: Payer: Self-pay | Admitting: Physician Assistant

## 2021-05-08 DIAGNOSIS — M8660 Other chronic osteomyelitis, unspecified site: Secondary | ICD-10-CM | POA: Diagnosis not present

## 2021-05-08 DIAGNOSIS — M869 Osteomyelitis, unspecified: Secondary | ICD-10-CM

## 2021-05-08 NOTE — Progress Notes (Signed)
Office Visit Note   Patient: Willie Pineda           Date of Birth: Feb 23, 1946           MRN: [id number] Visit Date: 05/08/2021              Requested by: Josetta Huddle, MD [address] [place],  [place] [address] PCP: Josetta Huddle, MD  No chief complaint on file.     HPI: Patient is a pleasant 75 year old gentleman who is 6 weeks status post left second ray amputation.  He was most recently in the hospital for irrigation and debridement of a septic shoulder.  He is currently on IV antibiotics via PICC line.  With regards to his foot he feels it is doing much better.  He is wearing his medical compression sock  Assessment & Plan: Visit Diagnoses: No diagnosis found.  Plan: Continue with medical compression sock.  Follow-up in 2 weeks.  Follow-Up Instructions: No follow-ups on file.   Ortho Exam  Patient is alert, oriented, no adenopathy, well-dressed, normal affect, normal respiratory effort. Examination demonstrates significant improvement in wound healing.  Some maceration in the webspace but does not probe deeply.  No surrounding cellulitis or sausage digit swelling.  No ascending cellulitis  Imaging: No results found. No images are attached to the encounter.  Labs: Lab Results  Component Value Date   HGBA1C 6.5 (H) 04/27/2021   HGBA1C 7.0 (H) 05/04/2016   ESRSEDRATE 32 (H) 04/30/2021   ESRSEDRATE 51 (H) 04/15/2021   ESRSEDRATE 28 01/03/2019   CRP 1.1 (H) 04/30/2021   CRP 42.3 (H) 04/15/2021   CRP 4.9 (H) 03/05/2021   LABURIC 9.5 (H) 04/01/2021   REPTSTATUS 05/04/2021 FINAL 04/29/2021   GRAMSTAIN  04/29/2021    RARE WBC PRESENT, PREDOMINANTLY MONONUCLEAR NO ORGANISMS SEEN    CULT  04/29/2021    No growth aerobically or anaerobically. Performed at [facility], [place] [address], [place], [place] [address]      Lab Results  Component Value Date   ALBUMIN 3.7 03/25/2016    No results found for: MG No results found for:  VD25OH  No results found for: PREALBUMIN CBC EXTENDED Latest Ref Rng & Units 04/30/2021 04/27/2021 04/15/2021  WBC 4.0 - 10.5 K/uL 14.4(H) 11.5(H) 11.3(H)  RBC 4.22 - 5.81 MIL/uL 4.08(L) 4.42 4.45  HGB 13.0 - 17.0 g/dL 13.3 14.1 14.3  HCT 39.0 - 52.0 % 40.5 42.5 42.2  PLT 150 - 400 K/uL 260 286 287  NEUTROABS 1,500 - 7,800 cells/uL - - 7,933(H)  LYMPHSABS 850 - 3,900 cells/uL - - 2,418     There is no height or weight on file to calculate BMI.  Orders:  No orders of the defined types were placed in this encounter.  No orders of the defined types were placed in this encounter.    Procedures: No procedures performed  Clinical Data: No additional findings.  ROS:  All other systems negative, except as noted in the HPI. Review of Systems  Objective: Vital Signs: There were no vitals taken for this visit.  Specialty Comments:  No specialty comments available.  PMFS History: Patient Active Problem List   Diagnosis Date Noted  . Septic joint of left shoulder region ([place]) 04/29/2021  . Osteomyelitis of second toe of left foot ([place])   . Penicillin allergy   . Chronic osteomyelitis ([place])   . Diabetic foot infection ([place])   . S/p total knee replacement, bilateral   .  Septic arthritis of shoulder, left ([place]) 03/03/2021  . Atrial fibrillation, chronic ([place]) 03/03/2021  . Chronic anticoagulation 03/03/2021  . DM (diabetes mellitus), type 2 with neurological complications ([place]) 17/00/1749  . Essential hypertension 03/03/2021  . Hypokalemia 03/03/2021  . Prolonged QT interval 03/03/2021  . COPD (chronic obstructive pulmonary disease) ([place]) 03/03/2021  . Chronic CHF (congestive heart failure) ([place]) 03/03/2021  . Diabetic peripheral neuropathy ([place]) 01/03/2019  . Obesity 06/01/2016  . Wound dehiscence 05/04/2016  . Knee osteoarthritis 04/06/2016   Past Medical History:  Diagnosis Date  . Arthritis    "hands primarily" (05/05/2016)  . Atrial fibrillation ([place])   . Back pain     age related  . CHF (congestive heart failure) ([place])    no problemes since 2018  . Complication of anesthesia    Bronchial spasms  . COPD (chronic obstructive pulmonary disease) ([facility])    inhalers as needed  . Coronary artery disease   . Depression    takes Zoloft daily  . Diabetic peripheral neuropathy ([place]) 01/03/2019  . Dysrhythmia    A Fib-takes Xarelto daily  . Enlarged prostate    benign  . GERD (gastroesophageal reflux disease)    takes Omeprazole daily  . 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 Hyperlipidemia    takes Atorvastatin daily  . Hypertension    takes Metoprolol,HCTZ,and Losartan daily.Takes Clonidine if needed  . Nocturia   . Peripheral neuropathy    takes Gabapentin daily  . Peripheral vascular disease ([facility])    legs blockage   . Pneumonia ?2015  . Restless leg   . Sleep apnea    uses Bipap  . Stroke [facility]) 2012   denies residual on 05/05/2016    Family History  Problem Relation Age of Onset  . CVA Mother   . Liver cancer Father     Past Surgical History:  Procedure Laterality Date  . AMPUTATION Left 04/03/2021   Procedure: LEFT 2ND RAY AMPUTATION;  Surgeon: Newt Minion, MD;  Location: [place];  Service: Orthopedics;  Laterality: Left;  . CARPAL TUNNEL RELEASE    . CATARACT EXTRACTION W/ INTRAOCULAR LENS  IMPLANT, BILATERAL Bilateral   . COLONOSCOPY WITH PROPOFOL N/A 03/09/2017   Procedure: COLONOSCOPY WITH PROPOFOL;  Surgeon: Wonda Horner, MD;  Location: [facility] END[facility]COPY;  Service: Endoscopy;  Laterality: N/A;  . COLONOSCOPY WITH PROPOFOL N/A 04/03/2020   Procedure: COLONOSCOPY WITH PROPOFOL;  Surgeon: Wonda Horner, MD;  Location: [facility]COPY;  Service: Endoscopy;  Laterality: N/A;  . I & D KNEE WITH POLY EXCHANGE Right 05/04/2016   IRRIGATION AND DEBRIDEMENT Right KNEE WITH UNICOMPARTMENTAL POLY EXCHANGE  . I & D KNEE WITH POLY EXCHANGE Right 05/04/2016   Procedure: IRRIGATION AND DEBRIDEMENT Right KNEE WITH  UNICOMPARTMENTAL POLY EXCHANGE;  Surgeon: Renette Butters, MD;  Location: [facility] [place];  Service: Orthopedics;  Laterality: Right;  . JOINT REPLACEMENT    . LAPAROSCOPIC CHOLECYSTECTOMY  1995  . PARTIAL KNEE ARTHROPLASTY Right 04/06/2016   Procedure: UNICOMPARTMENTAL RIGHT KNEE;  Surgeon: Renette Butters, MD;  Location: [facility];  Service: Orthopedics;  Laterality: Right;  . POLYPECTOMY  04/03/2020   Procedure: POLYPECTOMY;  Surgeon: Wonda Horner, MD;  Location: [facility];  Service: Endoscopy;;  . REVERSE SHOULDER ARTHROPLASTY Left 04/29/2021   Procedure: Revision antibiotic spacer placement. Incision and drainage.;  Surgeon: [name], MD;  Location: [facility];  Service: Orthopedics;  Laterality: Left;  .  STRABISMUS SURGERY Bilateral 06/10/2017   Procedure: REPAIR STRABISMUS BILATERAL;  Surgeon: Everitt Amber, MD;  Location: [facility];  Service: Ophthalmology;  Laterality: Bilateral;  . TONSILLECTOMY AND ADENOIDECTOMY    . TOTAL KNEE ARTHROPLASTY Left 2008  . TOTAL SHOULDER ARTHROPLASTY Left 03/04/2021   Procedure: Total Shoulder Arthroplasty;  Surgeon: [name], MD;  Location: [facility];  Service: Orthopedics;  Laterality: Left;  . ULNAR NERVE TRANSPOSITION Right 1989   Social History   Occupational History  . Occupation: Retired Therapist, sports  Tobacco Use  . Smoking status: Former Smoker    Packs/day: 0.50    Years: 40.00    Pack years: 20.00    Types: Cigarettes    Quit date: 07/15/2011    Years since quitting: 9.8  . Smokeless tobacco: Former Systems developer    Types: Secondary school teacher  . Vaping Use: Never used  Substance and Sexual Activity  . Alcohol use: Yes    Comment: maybe 2 glases wine every other week  . Drug use: No  . Sexual activity: Yes

## 2021-05-11 DIAGNOSIS — I10 Essential (primary) hypertension: Secondary | ICD-10-CM | POA: Diagnosis not present

## 2021-05-11 DIAGNOSIS — E1140 Type 2 diabetes mellitus with diabetic neuropathy, unspecified: Secondary | ICD-10-CM | POA: Diagnosis not present

## 2021-05-11 DIAGNOSIS — E1165 Type 2 diabetes mellitus with hyperglycemia: Secondary | ICD-10-CM | POA: Diagnosis not present

## 2021-05-13 DIAGNOSIS — G4733 Obstructive sleep apnea (adult) (pediatric): Secondary | ICD-10-CM | POA: Diagnosis not present

## 2021-05-13 DIAGNOSIS — M01X19 Direct infection of unspecified shoulder in infectious and parasitic diseases classified elsewhere: Secondary | ICD-10-CM | POA: Diagnosis not present

## 2021-05-13 DIAGNOSIS — M8660 Other chronic osteomyelitis, unspecified site: Secondary | ICD-10-CM | POA: Diagnosis not present

## 2021-05-14 ENCOUNTER — Encounter: Payer: Self-pay | Admitting: Infectious Disease

## 2021-05-14 ENCOUNTER — Other Ambulatory Visit: Payer: Self-pay

## 2021-05-14 ENCOUNTER — Ambulatory Visit: Payer: Medicare Other | Admitting: Infectious Disease

## 2021-05-14 VITALS — Wt 279.0 lb

## 2021-05-14 DIAGNOSIS — Z880 Allergy status to penicillin: Secondary | ICD-10-CM

## 2021-05-14 DIAGNOSIS — Z96653 Presence of artificial knee joint, bilateral: Secondary | ICD-10-CM | POA: Diagnosis not present

## 2021-05-14 DIAGNOSIS — E1149 Type 2 diabetes mellitus with other diabetic neurological complication: Secondary | ICD-10-CM

## 2021-05-14 DIAGNOSIS — I482 Chronic atrial fibrillation, unspecified: Secondary | ICD-10-CM

## 2021-05-14 DIAGNOSIS — M869 Osteomyelitis, unspecified: Secondary | ICD-10-CM

## 2021-05-14 DIAGNOSIS — R9431 Abnormal electrocardiogram [ECG] [EKG]: Secondary | ICD-10-CM

## 2021-05-14 DIAGNOSIS — M00812 Arthritis due to other bacteria, left shoulder: Secondary | ICD-10-CM

## 2021-05-14 DIAGNOSIS — E11628 Type 2 diabetes mellitus with other skin complications: Secondary | ICD-10-CM

## 2021-05-14 DIAGNOSIS — T8130XA Disruption of wound, unspecified, initial encounter: Secondary | ICD-10-CM | POA: Diagnosis not present

## 2021-05-14 DIAGNOSIS — L089 Local infection of the skin and subcutaneous tissue, unspecified: Secondary | ICD-10-CM

## 2021-05-14 DIAGNOSIS — Z23 Encounter for immunization: Secondary | ICD-10-CM | POA: Diagnosis not present

## 2021-05-14 DIAGNOSIS — M8660 Other chronic osteomyelitis, unspecified site: Secondary | ICD-10-CM

## 2021-05-14 DIAGNOSIS — M009 Pyogenic arthritis, unspecified: Secondary | ICD-10-CM

## 2021-05-14 NOTE — Progress Notes (Signed)
Subjective:  Chief complaint: followup for septic shoulder and osteomyelitis of foot   Patient ID: Willie Pineda, male    DOB: 1946-02-19, 75 y.o.   MRN: [id number]  HPI  Ellard Artis a 75 -year-old Caucasian man who is a retired Marine scientist and also former Writer who has a history of atrial fibrillation hypertension hyperlipidemia nonischemic cardiomyopathy peripheral vascular disease COPD diabetes mellitus and prior right sided prosthetic knee infection (treated by my partners Dr. Baxter Flattery and Dr. Linus Salmons in 2017), who is being prepared for possible shoulder replacement surgery with Dr. Griffin Basil when a CT and MRI scan were done which showed a septic shoulder and abscess.  MRI specifically showed a large complex joint effusion with enhancing synovitis with high-grade partial-thickness cartilage loss of the glenohumeral joint subchondral bone marrow edema in the inferior aspect of the glenoid likely to osteomyelitis, tear of the supraspinatus infraspinatus tendons complete tear of the subscapularis tendon and dislocation long head of biceps tendon and severe synovitis of the long head of the biceps tendon which also thought to possibly represent intra-articular infection.  Of note he had injured this shoulder when he had fallen from his horse a year ago in December and struck the ground quite hard but without clear breaks in the skin but with extensive bruising.  He also told me that when he was younger he had a horse pull him through backwards and injured the same shoulder.  The patient had been seen by March 17 where he had developed a soft tissue infection which was debrided in the podiatry office.  He was given 10 days of doxycycline which he was on during admission to the hospital.  Note this was also a site of prior injury where horse and stepped on his toe.  Taken to the operating room by Dr. Griffin Basil where he underwent left shoulder I&D with debridement of abscess and also removal of bone from an  area of osteomyelitis involving the humeral head incision debridement of open septic shoulder synovectomy I&D of the [facility] joint with resection of part of the distal clavicle where there was osteomyelitis and placement of a left shoulder antibiotic spacer hemiarthroplasty.  Multiple cultures were done including for organisms that are more subacute in presentation such as nontuberculous back mycobacteria and fungi.  He was initially on vancomycin and aztreonam and metronidazole but later discharged on oral doxycycline and levofloxacin he has subsequently grown Propionibacterium acnes on multiple cultures.  We narrowed him to doxycycline.  Shoulder pain had initially improved.Marland Kitchen  His footwas appearing stable but he had not yet seen podiatry.  I obtained plain films which showed bony resorptive change in the distal phalanx of second digit concerning for osteomyelitis.  He also had a regular arthropathy of the TMT joints.  Patient was eval by Meridee Score and underwent left second ray amputation.  No cultures were taken.  He had been doing relatively well though he had some problem with bleeding at the operative site.  He is being followed very closely by orthopedic surgery.  At my last visit he had developed significant left-sided shoulder pain that was new in nature before he was experiencing more of a dull pain that he rated about 3 out of 10 in severity exacerbated by movement.  Requested MRI of the shoulder which we ordered.  MRI showed a moderate sized rim-enhancing effusion with thick enhancement posteriorly along the glenohumeral joint, there is also some enhancement along the humeral stem of the antibiotic spacer consistent with  postoperative change versus osteomyelitis which we would formant favored to be the former.  He was taken the operating room by Dr. Griffin Basil.  He performed I&D and did encounter purulent material in the operating room.  This was sent for culture.  He completed  resection of thick synovial capsule with synovectomy.  Capsule bone and purulent and fascia material were debrided antibiotic spacer was removed and the bone in the medullary canal was debrided.  Area was irrigated and local vancomycin powder was placed along with a spacer with vancomycin and gentamicin's powder.  Cultures taken did not yield any organism though there was mention by my partner Dr. Linus Salmons of a gram-negative rod having been seen on gram stain.  Patient was placed on ceftriaxone with plans for 3 weeks of treatment.  His shoulder pain is dramatically improved .  He has seen Dr. Sharol Given for his foot and had sutures removed.      Past Medical History:  Diagnosis Date   Arthritis    "hands primarily" (05/05/2016)   Atrial fibrillation (HCC)    Back pain    age related   CHF (congestive heart failure) ([place])    no problemes since 1740   Complication of anesthesia    Bronchial spasms   COPD (chronic obstructive pulmonary disease) (HCC)    inhalers as needed   Coronary artery disease    Depression    takes Zoloft daily   Diabetic peripheral neuropathy ([place]) 01/03/2019   Dysrhythmia    A Fib-takes Xarelto daily   Enlarged prostate    benign   GERD (gastroesophageal reflux disease)    takes Omeprazole daily   History of blood clots    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)   Hyperlipidemia    takes Atorvastatin daily   Hypertension    takes Metoprolol,HCTZ,and Losartan daily.Takes Clonidine if needed   Nocturia    Peripheral neuropathy    takes Gabapentin daily   Peripheral vascular disease (HCC)    legs blockage    Pneumonia ?2015   Restless leg    Sleep apnea    uses Bipap   Stroke [facility]) 2012   denies residual on 05/05/2016    Past Surgical History:  Procedure Laterality Date   AMPUTATION Left 04/03/2021   Procedure: LEFT 2ND RAY AMPUTATION;  Surgeon: Newt Minion, MD;  Location: [place];  Service: Orthopedics;  Laterality: Left;   CARPAL TUNNEL  RELEASE     CATARACT EXTRACTION W/ INTRAOCULAR LENS  IMPLANT, BILATERAL Bilateral    COLONOSCOPY WITH PROPOFOL N/A 03/09/2017   Procedure: COLONOSCOPY WITH PROPOFOL;  Surgeon: Wonda Horner, MD;  Location: [facility] ENDOSCOPY;  Service: Endoscopy;  Laterality: N/A;   COLONOSCOPY WITH PROPOFOL N/A 04/03/2020   Procedure: COLONOSCOPY WITH PROPOFOL;  Surgeon: Wonda Horner, MD;  Location: [facility] ENDOSCOPY;  Service: Endoscopy;  Laterality: N/A;   I & D KNEE WITH POLY EXCHANGE Right 05/04/2016   IRRIGATION AND DEBRIDEMENT Right KNEE WITH UNICOMPARTMENTAL POLY EXCHANGE   I & D KNEE WITH POLY EXCHANGE Right 05/04/2016   Procedure: IRRIGATION AND DEBRIDEMENT Right KNEE WITH UNICOMPARTMENTAL POLY EXCHANGE;  Surgeon: Renette Butters, MD;  Location: [place];  Service: Orthopedics;  Laterality: Right;   JOINT REPLACEMENT     LAPAROSCOPIC CHOLECYSTECTOMY  1995   PARTIAL KNEE ARTHROPLASTY Right 04/06/2016   Procedure: UNICOMPARTMENTAL RIGHT KNEE;  Surgeon: Renette Butters, MD;  Location: [place];  Service: Orthopedics;  Laterality: Right;   POLYPECTOMY  04/03/2020  Procedure: POLYPECTOMY;  Surgeon: Wonda Horner, MD;  Location: [facility] ENDOSCOPY;  Service: Endoscopy;;   REVERSE SHOULDER ARTHROPLASTY Left 04/29/2021   Procedure: Revision antibiotic spacer placement. Incision and drainage.;  Surgeon: [name], MD;  Location: [facility];  Service: Orthopedics;  Laterality: Left;   STRABISMUS SURGERY Bilateral 06/10/2017   Procedure: REPAIR STRABISMUS BILATERAL;  Surgeon: Everitt Amber, MD;  Location: [place];  Service: Ophthalmology;  Laterality: Bilateral;   TONSILLECTOMY AND ADENOIDECTOMY     TOTAL KNEE ARTHROPLASTY Left 2008   TOTAL SHOULDER ARTHROPLASTY Left 03/04/2021   Procedure: Total Shoulder Arthroplasty;  Surgeon: [name], MD;  Location: [facility];  Service: Orthopedics;  Laterality: Left;   ULNAR NERVE TRANSPOSITION Right 1989    Family History  Problem Relation Age of Onset   CVA Mother     Liver cancer Father       Social History   Socioeconomic History   Marital status: Married    Spouse name: Not on file   Number of children: Not on file   Years of education: Not on file   Highest education level: Not on file  Occupational History   Occupation: Retired Therapist, sports  Tobacco Use   Smoking status: Former    Packs/day: 0.50    Years: 40.00    Pack years: 20.00    Types: Cigarettes    Quit date: 07/15/2011    Years since quitting: 9.8   Smokeless tobacco: Former    Types: Nurse, children's Use: Never used  Substance and Sexual Activity   Alcohol use: Yes    Comment: maybe 2 glases wine every other week   Drug use: No   Sexual activity: Yes  Other Topics Concern   Not on file  Social History Narrative   Lives at home with spouse    Caffeine 2 cups of coffee daily    Right handed.    Social Determinants of Health   Financial Resource Strain: Not on file  Food Insecurity: Not on file  Transportation Needs: Not on file  Physical Activity: Not on file  Stress: Not on file  Social Connections: Not on file    Allergies  Allergen Reactions   Penicillins Anaphylaxis    Reports an anaphylactic reaction when he was 13 to IM penicillin.  Has tolerated multiple cephalosporins in the past.   Tolerated Cephalosporin Date: 04/30/21.     Alfalfa    Dymista [Azelastine-Fluticasone] Other (See Comments)    Burning sensation    Flonase [Fluticasone Propionate] Other (See Comments)    burning   Hydrocodone Other (See Comments)    SWEATING   Warfarin And Related Other (See Comments)    Pt states it makes his INR critical levels     Current Outpatient Medications:    cefTRIAXone (ROCEPHIN) IVPB, Inject 2 g into the vein daily for 21 days. Indication: Shoulder infection  First Dose: Yes Last Day of Therapy:  05/20/21 Labs - Once weekly:  CBC/D and BMP, Labs - Every other week:  ESR and CRP Method of administration: IV Push Method of administration may be changed  at the discretion of home infusion pharmacist based upon assessment of the patient and/or caregiver's ability to self-administer the medication ordered., Disp: 21 Units, Rfl: 0   acetaminophen (TYLENOL) 500 MG tablet, Take 2 tablets (1,000 mg total) by mouth every 8 (eight) hours for 14 days., Disp: 84 tablet, Rfl: 0   allopurinol (ZYLOPRIM) 100 MG tablet,  Take 1 tablet (100 mg total) by mouth 2 (two) times daily., Disp: 60 tablet, Rfl: 12   atorvastatin (LIPITOR) 10 MG tablet, Take 10 mg by mouth every other day. In the evening, Disp: , Rfl: 6   carboxymethylcellulose (REFRESH PLUS) 0.5 % SOLN, Place 1 drop into both eyes 3 (three) times daily as needed (dry/irritated eyes)., Disp: , Rfl:    cloNIDine (CATAPRES) 0.1 MG tablet, Take 0.1 mg by mouth daily as needed (Only takes if pt if bp  > 160)., Disp: , Rfl:    colchicine 0.6 MG tablet, Take 1 tablet (0.6 mg total) by mouth daily. As needed for gout pain. (Patient taking differently: Take 0.6 mg by mouth daily as needed (gout pain).), Disp: 60 tablet, Rfl: 12   Cyanocobalamin (VITAMIN B 12) 500 MCG TABS, Take 500 mcg by mouth in the morning., Disp: , Rfl:    cyclobenzaprine (FLEXERIL) 10 MG tablet, Take 10 mg by mouth at bedtime as needed for muscle spasms. , Disp: , Rfl:    diclofenac Sodium (VOLTAREN) 1 % GEL, Apply 2 g topically 2 (two) times daily as needed (pain)., Disp: , Rfl:    ENTRESTO 24-26 MG, Take 1 tablet by mouth 2 (two) times daily., Disp: , Rfl:    gabapentin (NEURONTIN) 300 MG capsule, Take 900 mg by mouth 2 (two) times daily., Disp: , Rfl:    metolazone (ZAROXOLYN) 2.5 MG tablet, Take 2.5 mg by mouth daily as needed (fluid retention)., Disp: , Rfl:    metoprolol succinate (TOPROL-XL) 50 MG 24 hr tablet, Take 50 mg by mouth in the morning. Take with or immediately following a meal., Disp: , Rfl:    nitroGLYCERIN (NITRODUR - DOSED IN MG/24 HR) 0.2 mg/hr patch, Place 1 patch (0.2 mg total) onto the skin daily. (Patient taking  differently: Place 0.2 mg onto the skin in the morning. Applied to foot), Disp: 30 patch, Rfl: 12   omeprazole (PRILOSEC) 20 MG capsule, Take 20 mg by mouth in the morning and at bedtime., Disp: , Rfl:    potassium chloride (MICRO-K) 10 MEQ CR capsule, Take 10 mEq by mouth in the morning., Disp: , Rfl:    rivaroxaban (XARELTO) 20 MG TABS tablet, Take 20 mg by mouth in the morning., Disp: , Rfl:    Semaglutide,0.25 or 0.5MG/DOS, (OZEMPIC, 0.25 OR 0.5 MG/DOSE,) 2 MG/1.5ML SOPN, Inject 1 mg into the skin every Monday., Disp: , Rfl:    sertraline (ZOLOFT) 100 MG tablet, Take 100 mg by mouth in the morning., Disp: , Rfl:    spironolactone (ALDACTONE) 25 MG tablet, Take 25 mg by mouth in the morning., Disp: , Rfl:    torsemide (DEMADEX) 100 MG tablet, Take 100 mg by mouth in the morning., Disp: , Rfl:     Review of Systems  Constitutional:  Negative for chills and fever.  HENT:  Negative for congestion and sore throat.   Eyes:  Negative for photophobia.  Respiratory:  Negative for cough, shortness of breath and wheezing.   Cardiovascular:  Negative for chest pain, palpitations and leg swelling.  Gastrointestinal:  Negative for abdominal pain, blood in stool, constipation, diarrhea, nausea and vomiting.  Genitourinary:  Negative for dysuria, flank pain and hematuria.  Musculoskeletal:  Negative for arthralgias, back pain, gait problem and myalgias.  Skin:  Positive for wound. Negative for rash.  Neurological:  Negative for dizziness, weakness and headaches.  Hematological:  Does not bruise/bleed easily.  Psychiatric/Behavioral:  Negative for agitation, behavioral problems, confusion, self-injury  and suicidal ideas.       Objective:   Physical Exam Constitutional:      General: He is not in acute distress.    Appearance: Normal appearance. He is well-developed. He is not ill-appearing or diaphoretic.  HENT:     Head: Normocephalic and atraumatic.     Right Ear: Hearing and external ear  normal.     Left Ear: Hearing and external ear normal.     Nose: No nasal deformity or rhinorrhea.  Eyes:     General: No scleral icterus.    Conjunctiva/sclera: Conjunctivae normal.     Right eye: Right conjunctiva is not injected.     Left eye: Left conjunctiva is not injected.     Pupils: Pupils are equal, round, and reactive to light.  Neck:     Vascular: No JVD.  Cardiovascular:     Rate and Rhythm: Normal rate and regular rhythm.     Heart sounds: S1 normal and S2 normal.    No friction rub.  Pulmonary:     Effort: Pulmonary effort is normal. No respiratory distress.     Breath sounds: No wheezing.  Abdominal:     General: There is no distension.     Palpations: Abdomen is soft.  Musculoskeletal:        General: Normal range of motion.     Right shoulder: Normal.     Left shoulder: Tenderness present.     Cervical back: Normal range of motion and neck supple.     Right hip: Normal.     Left hip: Normal.     Right knee: Normal.     Left knee: Normal.  Lymphadenopathy:     Head:     Right side of head: No submandibular, preauricular or posterior auricular adenopathy.     Left side of head: No submandibular, preauricular or posterior auricular adenopathy.     Cervical: No cervical adenopathy.     Right cervical: No superficial or deep cervical adenopathy.    Left cervical: No superficial or deep cervical adenopathy.  Skin:    General: Skin is warm and dry.     Coloration: Skin is not pale.     Findings: No abrasion, bruising, ecchymosis, erythema, lesion or rash.     Nails: There is no clubbing.  Neurological:     General: No focal deficit present.     Mental Status: He is alert and oriented to person, place, and time.     Sensory: No sensory deficit.     Coordination: Coordination normal.     Gait: Gait normal.  Psychiatric:        Attention and Perception: He is attentive.        Mood and Affect: Mood normal.        Speech: Speech normal.        Behavior:  Behavior normal. Behavior is cooperative.        Thought Content: Thought content normal.        Judgment: Judgment normal.    Left shoulder in sling.     Left foot pictured below March 12, 2021:     Left foot after surgery pictured Apr 15, 2021:       Left foot today 05/14/2021:    PICC line 69/2022 is clean    Assessment & Plan:  Left septic shoulder with also osteomyelitis involving the clavicle and humerus: Propionibacterium is isolated  He has had repeat surgery. There was mention of GNR by  Dr. Linus Salmons  I spoke with Darquita from the microbiology lab and she was able to tract the audit trail on both cultures that were taken and there is never mention of a gram-negative rod out on gram stain.  I also talked with the patient and he did not have an aspiration of his shoulder performed outside the hospital.   I would like to extend his IV antibiotics to reach 6 weeks and to reassess him prior to stopping them.   Diabetic foot infection with osteomyelitis now status post second ray amputation: See  Chronic atrial fibrillation: He is going to see an electrophysiologist with consideration of pacemaker placement    Former prosthetic joint infection on the right: Status post treatment there is no specific organism    History of penicillin allergy: he had hx of actual anaphylaxis in the past but has tested negative at allergist he tells me  Hx of PJI: He was treated in 2017 with 6 weeks of therapy parenterally but not followed by chronic oral therapy seems like there was not complete conviction that the joint was infected though my understanding from talking to him today was that synovial fluid was leaking through the skin.  COVID 19 prevention: he received his 2nd [place] booster today in clinic.  I spent more than 40 minutes with the patient including greater than 50% of time in face to face counseling of the patient personally reviewing radiographs, aong with pertinent  laboratory microbiological data review of medical records and in coordination of his care.

## 2021-05-14 NOTE — Progress Notes (Signed)
   Covid-19 Vaccination Clinic  Name:  Willie Pineda    MRN: [id number] DOB: [birth year]  05/14/2021  [name] was observed post Covid-19 immunization for 15 minutes without incident. He was provided with Vaccine Information Sheet and instruction to access the V-Safe system.   [name] was instructed to call 911 with any severe reactions post vaccine: Difficulty breathing  Swelling of face and throat  A fast heartbeat  A bad rash all over body  Dizziness and weakness     Glenora Morocho T Brooks Sailors

## 2021-05-15 DIAGNOSIS — I5022 Chronic systolic (congestive) heart failure: Secondary | ICD-10-CM | POA: Diagnosis not present

## 2021-05-15 DIAGNOSIS — I10 Essential (primary) hypertension: Secondary | ICD-10-CM | POA: Diagnosis not present

## 2021-05-15 DIAGNOSIS — M8660 Other chronic osteomyelitis, unspecified site: Secondary | ICD-10-CM | POA: Diagnosis not present

## 2021-05-15 DIAGNOSIS — I482 Chronic atrial fibrillation, unspecified: Secondary | ICD-10-CM | POA: Diagnosis not present

## 2021-05-19 DIAGNOSIS — M19012 Primary osteoarthritis, left shoulder: Secondary | ICD-10-CM | POA: Diagnosis not present

## 2021-05-20 DIAGNOSIS — M01X19 Direct infection of unspecified shoulder in infectious and parasitic diseases classified elsewhere: Secondary | ICD-10-CM | POA: Diagnosis not present

## 2021-05-20 DIAGNOSIS — M8660 Other chronic osteomyelitis, unspecified site: Secondary | ICD-10-CM | POA: Diagnosis not present

## 2021-05-21 DIAGNOSIS — M8660 Other chronic osteomyelitis, unspecified site: Secondary | ICD-10-CM | POA: Diagnosis not present

## 2021-05-22 ENCOUNTER — Ambulatory Visit (INDEPENDENT_AMBULATORY_CARE_PROVIDER_SITE_OTHER): Payer: Medicare Other | Admitting: Physician Assistant

## 2021-05-22 ENCOUNTER — Encounter: Payer: Self-pay | Admitting: Physician Assistant

## 2021-05-22 DIAGNOSIS — M869 Osteomyelitis, unspecified: Secondary | ICD-10-CM

## 2021-05-22 NOTE — Progress Notes (Signed)
Office Visit Note   Patient: Willie Pineda           Date of Birth: 26-Sep-1946           MRN: [id number] Visit Date: 05/22/2021              Requested by: Josetta Huddle, MD [address] [place],  [place] [address] PCP: Josetta Huddle, MD  Chief Complaint  Patient presents with   Left Foot - Follow-up      HPI:   Assessment & Plan: Visit Diagnoses: No diagnosis found.  Plan:   Follow-Up Instructions: No follow-ups on file.   Ortho Exam  Patient is alert, oriented, no adenopathy, well-dressed, normal affect, normal respiratory effort.   Imaging: No results found. No images are attached to the encounter.  Labs: Lab Results  Component Value Date   HGBA1C 6.5 (H) 04/27/2021   HGBA1C 7.0 (H) 05/04/2016   ESRSEDRATE 32 (H) 04/30/2021   ESRSEDRATE 51 (H) 04/15/2021   ESRSEDRATE 28 01/03/2019   CRP 1.1 (H) 04/30/2021   CRP 42.3 (H) 04/15/2021   CRP 4.9 (H) 03/05/2021   LABURIC 9.5 (H) 04/01/2021   REPTSTATUS 05/04/2021 FINAL 04/29/2021   GRAMSTAIN  04/29/2021    RARE WBC PRESENT, PREDOMINANTLY MONONUCLEAR NO ORGANISMS SEEN    CULT  04/29/2021    No growth aerobically or anaerobically. Performed at [facility], [place] [address] [place], [place] [address]      Lab Results  Component Value Date   ALBUMIN 3.7 03/25/2016    No results found for: MG No results found for: VD25OH  No results found for: PREALBUMIN CBC EXTENDED Latest Ref Rng & Units 04/30/2021 04/27/2021 04/15/2021  WBC 4.0 - 10.5 K/uL 14.4(H) 11.5(H) 11.3(H)  RBC 4.22 - 5.81 MIL/uL 4.08(L) 4.42 4.45  HGB 13.0 - 17.0 g/dL 13.3 14.1 14.3  HCT 39.0 - 52.0 % 40.5 42.5 42.2  PLT 150 - 400 K/uL 260 286 287  NEUTROABS 1,500 - 7,800 cells/uL - - 7,933(H)  LYMPHSABS 850 - 3,900 cells/uL - - 2,418     There is no height or weight on file to calculate BMI.  Orders:  No orders of the defined types were placed in this encounter.  No orders of the defined types were placed in  this encounter.    Procedures: No procedures performed  Clinical Data: No additional findings.  ROS:  All other systems negative, except as noted in the HPI. Review of Systems  Objective: Vital Signs: There were no vitals taken for this visit.  Specialty Comments:  No specialty comments available.  PMFS History: Patient Active Problem List   Diagnosis Date Noted   Septic joint of left shoulder region ([place]) 04/29/2021   Osteomyelitis of second toe of left foot (HCC)    Penicillin allergy    Chronic osteomyelitis ([place])    Diabetic foot infection ([place])    S/p total knee replacement, bilateral    Septic arthritis of shoulder, left ([place]) 03/03/2021   Atrial fibrillation, chronic ([place]) 03/03/2021   Chronic anticoagulation 03/03/2021   DM (diabetes mellitus), type 2 with neurological complications ([place]) 88/91/6945   Essential hypertension 03/03/2021   Hypokalemia 03/03/2021   Prolonged QT interval 03/03/2021   COPD (chronic obstructive pulmonary disease) ([place]) 03/03/2021   Chronic CHF (congestive heart failure) ([place]) 03/03/2021   Diabetic peripheral neuropathy ([place]) 01/03/2019   Obesity 06/01/2016   Wound dehiscence 05/04/2016   Knee osteoarthritis 04/06/2016   Past Medical History:  Diagnosis Date   Arthritis    "hands primarily" (05/05/2016)   Atrial fibrillation (HCC)    Back pain    age related   CHF (congestive heart failure) ([place])    no problemes since 6269   Complication of anesthesia    Bronchial spasms   COPD (chronic obstructive pulmonary disease) (HCC)    inhalers as needed   Coronary artery disease    Depression    takes Zoloft daily   Diabetic peripheral neuropathy ([place]) 01/03/2019   Dysrhythmia    A Fib-takes Xarelto daily   Enlarged prostate    benign   GERD (gastroesophageal reflux disease)    takes Omeprazole daily   History of blood clots    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)   Hyperlipidemia    takes Atorvastatin daily    Hypertension    takes Metoprolol,HCTZ,and Losartan daily.Takes Clonidine if needed   Nocturia    Peripheral neuropathy    takes Gabapentin daily   Peripheral vascular disease (HCC)    legs blockage    Pneumonia ?2015   Restless leg    Sleep apnea    uses Bipap   Stroke [facility]) 2012   denies residual on 05/05/2016    Family History  Problem Relation Age of Onset   CVA Mother    Liver cancer Father     Past Surgical History:  Procedure Laterality Date   AMPUTATION Left 04/03/2021   Procedure: LEFT 2ND RAY AMPUTATION;  Surgeon: Newt Minion, MD;  Location: [place];  Service: Orthopedics;  Laterality: Left;   CARPAL TUNNEL RELEASE     CATARACT EXTRACTION W/ INTRAOCULAR LENS  IMPLANT, BILATERAL Bilateral    COLONOSCOPY WITH PROPOFOL N/A 03/09/2017   Procedure: COLONOSCOPY WITH PROPOFOL;  Surgeon: Wonda Horner, MD;  Location: [facility];  Service: Endoscopy;  Laterality: N/A;   COLONOSCOPY WITH PROPOFOL N/A 04/03/2020   Procedure: COLONOSCOPY WITH PROPOFOL;  Surgeon: Wonda Horner, MD;  Location: [facility];  Service: Endoscopy;  Laterality: N/A;   I & D KNEE WITH POLY EXCHANGE Right 05/04/2016   IRRIGATION AND DEBRIDEMENT Right KNEE WITH UNICOMPARTMENTAL POLY EXCHANGE   I & D KNEE WITH POLY EXCHANGE Right 05/04/2016   Procedure: IRRIGATION AND DEBRIDEMENT Right KNEE WITH UNICOMPARTMENTAL POLY EXCHANGE;  Surgeon: Renette Butters, MD;  Location: [facility];  Service: Orthopedics;  Laterality: Right;   JOINT REPLACEMENT     LAPAROSCOPIC CHOLECYSTECTOMY  1995   PARTIAL KNEE ARTHROPLASTY Right 04/06/2016   Procedure: UNICOMPARTMENTAL RIGHT KNEE;  Surgeon: Renette Butters, MD;  Location: [place];  Service: Orthopedics;  Laterality: Right;   POLYPECTOMY  04/03/2020   Procedure: POLYPECTOMY;  Surgeon: Wonda Horner, MD;  [facility];  Service: Endoscopy;;   REVERSE SHOULDER ARTHROPLASTY Left 04/29/2021   Procedure: Revision antibiotic spacer placement. Incision and drainage.;  Surgeon:  [name], MD;  Location: [facility];  Service: Orthopedics;  Laterality: Left;   STRABISMUS SURGERY Bilateral 06/10/2017   Procedure: REPAIR STRABISMUS BILATERAL;  Surgeon: Everitt Amber, MD;  Location: [place];  Service: Ophthalmology;  Laterality: Bilateral;   TONSILLECTOMY AND ADENOIDECTOMY     TOTAL KNEE ARTHROPLASTY Left 2008   TOTAL SHOULDER ARTHROPLASTY Left 03/04/2021   Procedure: Total Shoulder Arthroplasty;  Surgeon: [name], MD;  [facility];  Service: Orthopedics;  Laterality: Left;   ULNAR NERVE TRANSPOSITION Right 1989   Social History   Occupational History   Occupation: Retired Therapist, sports  Tobacco Use   Smoking status: Former    Packs/day: 0.50    Years: 40.00    Pack years: 20.00    Types: Cigarettes    Quit date: 07/15/2011    Years since quitting: 9.8   Smokeless tobacco: Former    Types: Nurse, children's Use: Never used  Substance and Sexual Activity   Alcohol use: Yes    Comment: maybe 2 glases wine every other week   Drug use: No   Sexual activity: Yes

## 2021-05-22 NOTE — Progress Notes (Signed)
Office Visit Note   Patient: Willie Pineda           Date of Birth: 11-15-1946           MRN: [id number] Visit Date: 05/22/2021              Requested by: Josetta Huddle, MD [address] [place],  [place] [address] PCP: Josetta Huddle, MD  Chief Complaint  Patient presents with   Left Foot - Follow-up      HPI: Patient is a pleasant 75 year old gentleman who is 2 months status post left foot second ray amputation.  He is using compression socks and feels he is doing much better.  Assessment & Plan: Visit Diagnoses: No diagnosis found.  Plan: Patient is also on IV antibiotics status post shoulder surgery.  He is due to finish the antibiotics in July.  We will follow-up for final visit in 6 weeks  Follow-Up Insructions: No follow-ups on file.   Ortho Exam  Patient is alert, oriented, no adenopathy, well-dressed, normal affect, normal respiratory effort. Overall well-healed surgical incision.  Still some crusting in the webspace and just a spot of drainage.  No cellulitis erythema or signs of infection  Imaging: No results found. No images are attached to the encounter.  Labs: Lab Results  Component Value Date   HGBA1C 6.5 (H) 04/27/2021   HGBA1C 7.0 (H) 05/04/2016   ESRSEDRATE 32 (H) 04/30/2021   ESRSEDRATE 51 (H) 04/15/2021   ESRSEDRATE 28 01/03/2019   CRP 1.1 (H) 04/30/2021   CRP 42.3 (H) 04/15/2021   CRP 4.9 (H) 03/05/2021   LABURIC 9.5 (H) 04/01/2021   REPTSTATUS 05/04/2021 FINAL 04/29/2021   GRAMSTAIN  04/29/2021    RARE WBC PRESENT, PREDOMINANTLY MONONUCLEAR NO ORGANISMS SEEN    CULT  04/29/2021    No growth aerobically or anaerobically. Performed at [facility], [place] [address], [place],  [address]      Lab Results  Component Value Date   ALBUMIN 3.7 03/25/2016    No results found for: MG No results found for: VD25OH  No results found for: PREALBUMIN CBC EXTENDED Latest Ref Rng & Units 04/30/2021 04/27/2021 04/15/2021   WBC 4.0 - 10.5 K/uL 14.4(H) 11.5(H) 11.3(H)  RBC 4.22 - 5.81 MIL/uL 4.08(L) 4.42 4.45  HGB 13.0 - 17.0 g/dL 13.3 14.1 14.3  HCT 39.0 - 52.0 % 40.5 42.5 42.2  PLT 150 - 400 K/uL 260 286 287  NEUTROABS 1,500 - 7,800 cells/uL - - 7,933(H)  LYMPHSABS 850 - 3,900 cells/uL - - 2,418     There is no height or weight on file to calculate BMI.  Orders:  No orders of the defined types were placed in this encounter.  No orders of the defined types were placed in this encounter.    Procedures: No procedures performed  Clinical Data: No additional findings.  ROS:  All other systems negative, except as noted in the HPI. Review of Systems  Objective: Vital Signs: There were no vitals taken for this visit.  Specialty Comments:  No specialty comments available.  PMFS History: Patient Active Problem List   Diagnosis Date Noted   Septic joint of left shoulder region [facility]) 04/29/2021   Osteomyelitis of second toe of left foot (HCC)    Penicillin allergy    Chronic osteomyelitis (HCC)    Diabetic foot infection ([place])    S/p total knee replacement, bilateral    Septic arthritis of shoulder, left ([place]) 03/03/2021  Atrial fibrillation, chronic ([place]) 03/03/2021   Chronic anticoagulation 03/03/2021   DM (diabetes mellitus), type 2 with neurological complications ([place]) 44/96/7591   Essential hypertension 03/03/2021   Hypokalemia 03/03/2021   Prolonged QT interval 03/03/2021   COPD (chronic obstructive pulmonary disease) ([place]) 03/03/2021   Chronic CHF (congestive heart failure) ([place]) 03/03/2021   Diabetic peripheral neuropathy ([place]) 01/03/2019   Obesity 06/01/2016   Wound dehiscence 05/04/2016   Knee osteoarthritis 04/06/2016   Past Medical History:  Diagnosis Date   Arthritis    "hands primarily" (05/05/2016)   Atrial fibrillation (HCC)    Back pain    age related   CHF (congestive heart failure) ([place])    no problemes since 6384   Complication of anesthesia    Bronchial  spasms   COPD (chronic obstructive pulmonary disease) (HCC)    inhalers as needed   Coronary artery disease    Depression    takes Zoloft daily   Diabetic peripheral neuropathy ([place]) 01/03/2019   Dysrhythmia    A Fib-takes Xarelto daily   Enlarged prostate    benign   GERD (gastroesophageal reflux disease)    takes Omeprazole daily   History of blood clots    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)   Hyperlipidemia    takes Atorvastatin daily   Hypertension    takes Metoprolol,HCTZ,and Losartan daily.Takes Clonidine if needed   Nocturia    Peripheral neuropathy    takes Gabapentin daily   Peripheral vascular disease (HCC)    legs blockage    Pneumonia ?2015   Restless leg    Sleep apnea    uses Bipap   Stroke [facility]) 2012   denies residual on 05/05/2016    Family History  Problem Relation Age of Onset   CVA Mother    Liver cancer Father     Past Surgical History:  Procedure Laterality Date   AMPUTATION Left 04/03/2021   Procedure: LEFT 2ND RAY AMPUTATION;  Surgeon: Newt Minion, MD;  Location: [place];  Service: Orthopedics;  Laterality: Left;   CARPAL TUNNEL RELEASE     CATARACT EXTRACTION W/ INTRAOCULAR LENS  IMPLANT, BILATERAL Bilateral    COLONOSCOPY WITH PROPOFOL N/A 03/09/2017   Procedure: COLONOSCOPY WITH PROPOFOL;  Surgeon: Wonda Horner, MD;  Location: [facility] ENDOSCOPY;  Service: Endoscopy;  Laterality: N/A;   COLONOSCOPY WITH PROPOFOL N/A 04/03/2020   Procedure: COLONOSCOPY WITH PROPOFOL;  Surgeon: Wonda Horner, MD;  [facility];  Service: Endoscopy;  Laterality: N/A;   I & D KNEE WITH POLY EXCHANGE Right 05/04/2016   IRRIGATION AND DEBRIDEMENT Right KNEE WITH UNICOMPARTMENTAL POLY EXCHANGE   I & D KNEE WITH POLY EXCHANGE Right 05/04/2016   Procedure: IRRIGATION AND DEBRIDEMENT Right KNEE WITH UNICOMPARTMENTAL POLY EXCHANGE;  Surgeon: Renette Butters, MD;  Location: [place] [facility];  Service: Orthopedics;  Laterality: Right;   JOINT REPLACEMENT     LAPAROSCOPIC  CHOLECYSTECTOMY  1995   PARTIAL KNEE ARTHROPLASTY Right 04/06/2016   Procedure: UNICOMPARTMENTAL RIGHT KNEE;  Surgeon: Renette Butters, MD;  Location: [facility];  Service: Orthopedics;  Laterality: Right;   POLYPECTOMY  04/03/2020   Procedure: POLYPECTOMY;  Surgeon: Wonda Horner, MD;  [facility];  Service: Endoscopy;;   REVERSE SHOULDER ARTHROPLASTY Left 04/29/2021   Procedure: Revision antibiotic spacer placement. Incision and drainage.;  Surgeon: [name], MD;  [facility];  Service: Orthopedics;  Laterality: Left;   STRABISMUS SURGERY Bilateral 06/10/2017   Procedure: REPAIR STRABISMUS  BILATERAL;  Surgeon: Everitt Amber, MD;  Location: [place];  Service: Ophthalmology;  Laterality: Bilateral;   TONSILLECTOMY AND ADENOIDECTOMY     TOTAL KNEE ARTHROPLASTY Left 2008   TOTAL SHOULDER ARTHROPLASTY Left 03/04/2021   Procedure: Total Shoulder Arthroplasty;  Surgeon: [name], MD;  [facility];  Service: Orthopedics;  Laterality: Left;   ULNAR NERVE TRANSPOSITION Right 1989   Social History   Occupational History   Occupation: Retired Therapist, sports  Tobacco Use   Smoking status: Former    Packs/day: 0.50    Years: 40.00    Pack years: 20.00    Types: Cigarettes    Quit date: 07/15/2011    Years since quitting: 9.8   Smokeless tobacco: Former    Types: Nurse, children's Use: Never used  Substance and Sexual Activity   Alcohol use: Yes    Comment: maybe 2 glases wine every other week   Drug use: No   Sexual activity: Yes

## 2021-05-27 DIAGNOSIS — M8660 Other chronic osteomyelitis, unspecified site: Secondary | ICD-10-CM | POA: Diagnosis not present

## 2021-05-27 DIAGNOSIS — E119 Type 2 diabetes mellitus without complications: Secondary | ICD-10-CM | POA: Diagnosis not present

## 2021-05-27 DIAGNOSIS — H40003 Preglaucoma, unspecified, bilateral: Secondary | ICD-10-CM | POA: Diagnosis not present

## 2021-05-27 DIAGNOSIS — Z7689 Persons encountering health services in other specified circumstances: Secondary | ICD-10-CM | POA: Diagnosis not present

## 2021-05-27 DIAGNOSIS — H0100A Unspecified blepharitis right eye, upper and lower eyelids: Secondary | ICD-10-CM | POA: Diagnosis not present

## 2021-05-27 DIAGNOSIS — H527 Unspecified disorder of refraction: Secondary | ICD-10-CM | POA: Diagnosis not present

## 2021-05-27 DIAGNOSIS — H0100B Unspecified blepharitis left eye, upper and lower eyelids: Secondary | ICD-10-CM | POA: Diagnosis not present

## 2021-05-27 DIAGNOSIS — M01X19 Direct infection of unspecified shoulder in infectious and parasitic diseases classified elsewhere: Secondary | ICD-10-CM | POA: Diagnosis not present

## 2021-05-27 DIAGNOSIS — H35372 Puckering of macula, left eye: Secondary | ICD-10-CM | POA: Diagnosis not present

## 2021-05-27 DIAGNOSIS — H04123 Dry eye syndrome of bilateral lacrimal glands: Secondary | ICD-10-CM | POA: Diagnosis not present

## 2021-05-27 DIAGNOSIS — H35433 Paving stone degeneration of retina, bilateral: Secondary | ICD-10-CM | POA: Diagnosis not present

## 2021-05-27 LAB — FUNGUS CULTURE WITH STAIN

## 2021-05-27 LAB — FUNGUS CULTURE RESULT

## 2021-05-27 LAB — FUNGAL ORGANISM REFLEX

## 2021-05-28 DIAGNOSIS — M8660 Other chronic osteomyelitis, unspecified site: Secondary | ICD-10-CM | POA: Diagnosis not present

## 2021-06-03 DIAGNOSIS — M01X19 Direct infection of unspecified shoulder in infectious and parasitic diseases classified elsewhere: Secondary | ICD-10-CM | POA: Diagnosis not present

## 2021-06-03 DIAGNOSIS — M8660 Other chronic osteomyelitis, unspecified site: Secondary | ICD-10-CM | POA: Diagnosis not present

## 2021-06-04 DIAGNOSIS — M8660 Other chronic osteomyelitis, unspecified site: Secondary | ICD-10-CM | POA: Diagnosis not present

## 2021-06-11 ENCOUNTER — Other Ambulatory Visit: Payer: Self-pay

## 2021-06-11 ENCOUNTER — Encounter: Payer: Self-pay | Admitting: Infectious Disease

## 2021-06-11 ENCOUNTER — Telehealth: Payer: Self-pay

## 2021-06-11 ENCOUNTER — Ambulatory Visit: Payer: Medicare Other | Admitting: Infectious Disease

## 2021-06-11 VITALS — BP 130/84 | HR 60 | Wt 285.0 lb

## 2021-06-11 DIAGNOSIS — T8459XD Infection and inflammatory reaction due to other internal joint prosthesis, subsequent encounter: Secondary | ICD-10-CM | POA: Diagnosis not present

## 2021-06-11 DIAGNOSIS — Z880 Allergy status to penicillin: Secondary | ICD-10-CM | POA: Diagnosis not present

## 2021-06-11 DIAGNOSIS — R9431 Abnormal electrocardiogram [ECG] [EKG]: Secondary | ICD-10-CM | POA: Diagnosis not present

## 2021-06-11 DIAGNOSIS — M869 Osteomyelitis, unspecified: Secondary | ICD-10-CM | POA: Diagnosis not present

## 2021-06-11 DIAGNOSIS — Z96619 Presence of unspecified artificial shoulder joint: Secondary | ICD-10-CM | POA: Diagnosis not present

## 2021-06-11 DIAGNOSIS — L089 Local infection of the skin and subcutaneous tissue, unspecified: Secondary | ICD-10-CM

## 2021-06-11 DIAGNOSIS — E1149 Type 2 diabetes mellitus with other diabetic neurological complication: Secondary | ICD-10-CM

## 2021-06-11 DIAGNOSIS — E11628 Type 2 diabetes mellitus with other skin complications: Secondary | ICD-10-CM | POA: Diagnosis not present

## 2021-06-11 DIAGNOSIS — M8660 Other chronic osteomyelitis, unspecified site: Secondary | ICD-10-CM | POA: Diagnosis not present

## 2021-06-11 DIAGNOSIS — I482 Chronic atrial fibrillation, unspecified: Secondary | ICD-10-CM

## 2021-06-11 DIAGNOSIS — M009 Pyogenic arthritis, unspecified: Secondary | ICD-10-CM | POA: Diagnosis not present

## 2021-06-11 MED ORDER — MINOCYCLINE HCL 100 MG PO CAPS: 100.0000 mg | ORAL_CAPSULE | Freq: Two times a day (BID) | ORAL | 6 refills | Status: DC

## 2021-06-11 NOTE — Telephone Encounter (Signed)
PICC pulled in clinic today; [facility] notified of removal. Patient to begin oral ABX  Carlean Purl, RN

## 2021-06-11 NOTE — Progress Notes (Signed)
Subjective:    Patient ID: Willie Pineda, male    DOB: 11-12-1946, 75 y.o.   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 Baxter Flattery and Dr. Linus Salmons in 2017), who is being prepared for possible shoulder replacement surgery with Dr. Griffin Basil when a CT and MRI scan were done which showed a septic shoulder and abscess.  MRI specifically showed a large complex joint effusion with enhancing synovitis with high-grade partial-thickness cartilage loss of the glenohumeral joint subchondral bone marrow edema in the inferior aspect of the glenoid likely to osteomyelitis, tear of the supraspinatus infraspinatus tendons complete tear of the subscapularis tendon and dislocation long head of biceps tendon and severe synovitis of the long head of the biceps tendon which also thought to possibly represent intra-articular infection.   Of note he had injured this shoulder when he had fallen from his horse a year ago in December and struck the ground quite hard but without clear breaks in the skin but with extensive bruising.  He also told me that when he was younger he had a horse pull him through backwards and injured the same shoulder.   The patient had been seen by March 17 where he had developed a soft tissue infection which was debrided in the podiatry office.  He was given 10 days of doxycycline which he was on during admission to the hospital.   Note this was also a site of prior injury where horse and stepped on his toe.   Taken to the operating room by Dr. Griffin Basil where he underwent left shoulder I&D with debridement of abscess and also removal of bone from an area of osteomyelitis involving the humeral head incision debridement of open  septic shoulder synovectomy I&D of the [facility] joint with resection of part of the distal clavicle where there was osteomyelitis and placement of a left shoulder antibiotic spacer hemiarthroplasty.   Multiple cultures were done including for organisms that are more subacute in presentation such as nontuberculous back mycobacteria and fungi.  He was initially on vancomycin and aztreonam and metronidazole but later discharged on oral doxycycline and levofloxacin he has subsequently grown Propionibacterium acnes on multiple cultures.   We narrowed him to doxycycline.   Shoulder pain had initially improved.Marland Kitchen  His footwas appearing stable but he had not yet seen podiatry.   I obtained plain films which showed bony resorptive change in the distal phalanx of second digit concerning for osteomyelitis.  He also had a regular arthropathy of the TMT joints.   Patient was eval by Meridee Score and underwent left second ray amputation.   No cultures were taken.   He had been doing relatively well though he had some problem with bleeding at the operative site.   He is being followed very closely by orthopedic surgery.   At my last visit he had developed significant left-sided shoulder pain that was new in nature before he was experiencing more of a dull pain that he rated about 3 out of 10 in severity exacerbated by movement.   Requested MRI of the shoulder which we ordered.  MRI showed a moderate sized rim-enhancing effusion with thick enhancement posteriorly along the glenohumeral joint, there is also some enhancement along the humeral stem of the antibiotic spacer  consistent with postoperative change versus osteomyelitis which we would formant favored to be the former.   He was taken the operating room by Dr. Griffin Basil.   He performed I&D and did encounter purulent material in the operating room.  This was sent for culture.  He completed resection of thick synovial capsule with synovectomy.  Capsule bone and  purulent and fascia material were debrided antibiotic spacer was removed and the bone in the medullary canal was debrided.  Area was irrigated and local vancomycin powder was placed along with a spacer with vancomycin and gentamicin's powder.  Cultures taken did not yield any organism though there was mention by my partner Dr. Linus Salmons of a gram-negative rod having been seen on gram stain.   Patient was placed on ceftriaxone with plans for 3 weeks of treatment.  His shoulder pain is dramatically improved .  He saw Dr. Sharol Given and sutures were removed.  When I last saw him I double checked with the microbiology lab and there were no gram-negative rod seen on review of the Gram stain when reviewed by the microbiology staff.  Cultures never yielded an organism either.  I had him extend his antibiotics through today so that I could reassess him.  His shoulder pain continues to improve as does his range of motion.  His left foot is doing quite well is 2.  He did tell me that during anesthesia there was concerns about his cardiac conduction  He has RBBB, persistent atrial fibrillation and his cardiologist Dimple Nanas is considering biventricular defibrillator versus subcutaneous ICD.  My standpoint the patient is excepting his ICD is not possible anymore.  He ultimately is going to undergo reverse shoulder arthroplasty with Dr. Griffin Basil but would like to reduce risk of anesthesiology by optimizing his cardiac function.  I have myself quite comfortable with him undergoing placement of a biventricular pacemaker.   His shoulder infection is under control and I will continue oral abx,  I am not anxious about it spreading to his bloodstream.  I have not seen any evidence of him becoming or having been bacteremic at any point in time.  I have greater anxiety frankly about his risk for diabetic foot infections though his recent osteomyelitis seems to have been cured by Dr. Jess Barters intervention and he  seems vigilant about infection and he will continue to have risks for DFU going forward.      Review of Systems  Constitutional:  Negative for chills, diaphoresis and fever.  HENT:  Negative for congestion, hearing loss, sore throat and tinnitus.   Respiratory:  Negative for cough, shortness of breath and wheezing.   Cardiovascular:  Negative for chest pain, palpitations and leg swelling.  Gastrointestinal:  Negative for abdominal pain, blood in stool, constipation, diarrhea, nausea and vomiting.  Genitourinary:  Negative for dysuria, flank pain and hematuria.  Musculoskeletal:  Positive for arthralgias. Negative for back pain and myalgias.  Skin:  Positive for wound. Negative for rash.  Neurological:  Negative for dizziness, weakness and headaches.  Hematological:  Does not bruise/bleed easily.  Psychiatric/Behavioral:  Negative for agitation, behavioral problems, confusion, decreased concentration, dysphoric mood, self-injury and suicidal ideas. The patient is not nervous/anxious.       Objective:   Physical Exam Constitutional:      General: He is not in acute distress.    Appearance: Normal appearance. He is well-developed. He is not ill-appearing or diaphoretic.  HENT:     Head: Normocephalic and atraumatic.     Right  Ear: Hearing and external ear normal.     Left Ear: Hearing and external ear normal.     Nose: No nasal deformity or rhinorrhea.  Eyes:     General: No scleral icterus.    Conjunctiva/sclera: Conjunctivae normal.     Right eye: Right conjunctiva is not injected.     Left eye: Left conjunctiva is not injected.     Pupils: Pupils are equal, round, and reactive to light.  Neck:     Vascular: No JVD.  Cardiovascular:     Rate and Rhythm: Normal rate. Rhythm irregular.     Heart sounds: S1 normal and S2 normal.  Pulmonary:     Effort: No respiratory distress.     Breath sounds: No wheezing.  Abdominal:     General: Bowel sounds are normal. There is no  distension.     Palpations: Abdomen is soft.     Tenderness: There is no abdominal tenderness.  Musculoskeletal:        General: Normal range of motion.     Right shoulder: Normal.     Left shoulder: Normal.     Cervical back: Normal range of motion and neck supple.     Right hip: Normal.     Left hip: Normal.     Right knee: Normal.     Left knee: Normal.  Lymphadenopathy:     Head:     Right side of head: No submandibular, preauricular or posterior auricular adenopathy.     Left side of head: No submandibular, preauricular or posterior auricular adenopathy.     Cervical: No cervical adenopathy.     Right cervical: No superficial or deep cervical adenopathy.    Left cervical: No superficial or deep cervical adenopathy.  Skin:    General: Skin is warm and dry.     Coloration: Skin is not pale.     Findings: No abrasion, bruising, ecchymosis, erythema, lesion or rash.     Nails: There is no clubbing.  Neurological:     General: No focal deficit present.     Mental Status: He is alert and oriented to person, place, and time.     Sensory: No sensory deficit.     Coordination: Coordination normal.     Gait: Gait normal.  Psychiatric:        Attention and Perception: He is attentive.        Mood and Affect: Mood normal.        Speech: Speech normal.        Behavior: Behavior normal. Behavior is cooperative.        Thought Content: Thought content normal.        Judgment: Judgment normal.    Left shoulder with improved ROM  Right sided PICC 06/11/2021:     Left foot 06/11/2021:          Assessment & Plan:   Left septic shoulder with osteomyelitis involving clavicle and humerus on imaging.  Propionibacterium was isolated previously.  We will discontinue his PICC line and ceftriaxone and place him on minocycline.  I have noted reviewing [facility] guided that in cases where there is doxycycline resistance minocycline is still typically active.  We will go over this  agent given its greater reliability.  Continue this and follow him up in 1 month's time.  Diabetic foot infection with osteomyelitis status post second ray amputation: Seems to be doing well and is followed closely by Dr. Sharol Given  Persistent atrial fibrillation right bundle  branch block: I am supportive of him having biventricular defibrillator.  I do not think his shoulder infection will come out of control and seeded his bloodstream infection certainly not while he is on oral antibiotics and has had aggressive surgery with Dr. Griffin Basil.  I spent more than 40 minutes with the patient including face to face counseling of the patient personally reviewing radiographs, along with pertinent laboratory microbiological, virological data review of medical records before and during the visit and in coordination of his care.

## 2021-06-11 NOTE — Progress Notes (Signed)
Per verbal order from Dr Tommy Medal, 41 cm Single Lumen Peripherally Inserted Central Catheter removed from right basilic, tip intact. No sutures present. RN confirmed length per chart. Dressing was clean and dry. Petroleum dressing applied. Pt advised no heavy lifting with this arm, leave dressing for 24 hours and call the office or seek emergent care if dressing becomes soaked with blood or swelling or sharp pain presents. Patient verbalized understanding and agreement.  Patient's questions answered to their satisfaction. Patient tolerated procedure well, RN walked patient to check out. Pharmacy notified.  Mirren Gest Lorita Officer, RN

## 2021-06-18 DIAGNOSIS — I482 Chronic atrial fibrillation, unspecified: Secondary | ICD-10-CM | POA: Diagnosis not present

## 2021-06-18 DIAGNOSIS — I10 Essential (primary) hypertension: Secondary | ICD-10-CM | POA: Diagnosis not present

## 2021-06-22 ENCOUNTER — Telehealth: Payer: Self-pay

## 2021-06-22 NOTE — Telephone Encounter (Signed)
Please advise 

## 2021-06-22 NOTE — Telephone Encounter (Signed)
[facility] called. Patient is tentatively scheduled for a defibrillator implantation to his left pectoral area on 07/27/2021. He also is suppose to be having an upcoming surgery with Dr.Duda for his shoulder. They need to know if implanting this device will be a problem. Please contact them ASAP at #[phone] option #3. Thanks!

## 2021-06-23 NOTE — Telephone Encounter (Signed)
Called and sw Carolyn at Cards office to advise of message below.

## 2021-06-30 ENCOUNTER — Other Ambulatory Visit: Payer: Self-pay | Admitting: Orthopaedic Surgery

## 2021-06-30 DIAGNOSIS — M25512 Pain in left shoulder: Secondary | ICD-10-CM | POA: Diagnosis not present

## 2021-06-30 DIAGNOSIS — M19012 Primary osteoarthritis, left shoulder: Secondary | ICD-10-CM | POA: Diagnosis not present

## 2021-07-03 ENCOUNTER — Encounter: Payer: Self-pay | Admitting: Family

## 2021-07-03 ENCOUNTER — Ambulatory Visit (INDEPENDENT_AMBULATORY_CARE_PROVIDER_SITE_OTHER): Payer: Medicare Other | Admitting: Family

## 2021-07-03 DIAGNOSIS — M79675 Pain in left toe(s): Secondary | ICD-10-CM | POA: Diagnosis not present

## 2021-07-03 LAB — URIC ACID: Uric Acid, Serum: 8.5 mg/dL — ABNORMAL HIGH (ref 4.0–8.0)

## 2021-07-03 NOTE — Progress Notes (Signed)
Post-Op Visit Note   Patient: Willie Pineda           Date of Birth: 08/09/1946           MRN: [id number] Visit Date: 07/03/2021 PCP: Josetta Huddle, MD  Chief Complaint: No chief complaint on file.   HPI:  HPI Patient is a 75 year old gentleman seen status post left foot second ray amputation wearing compression socks doing quite well.  The foot is well-healed he continues wearing compression garments  He has been taking allopurinol and colchicine for some gout.  He states his gout was last evaluated in 3 months ago. Ortho Exam On examination of the left lower extremity there is trace edema.  There is a well-healed surgical scar from the second ray amputation.  No erythema  Visit Diagnoses:  1. Pain in left toe(s)     Plan: We will check his uric acid today.  Discussed his allopurinol colchicine regimen.  He will follow-up in the office as needed we will call with his uric acid.  Request we call his wife.  Follow-Up Instructions: Return if symptoms worsen or fail to improve.   Imaging: No results found.  Orders:  Orders Placed This Encounter  Procedures   Uric acid   No orders of the defined types were placed in this encounter.    PMFS History: Patient Active Problem List   Diagnosis Date Noted   Septic joint of left shoulder region ([place]) 04/29/2021   Osteomyelitis of second toe of left foot (H[facility])    Penicillin allergy    Chronic osteomyelitis ([place])    Diabetic foot infection ([place])    S/p total knee replacement, bilateral    Septic arthritis of shoulder, left ([place]) 03/03/2021   Atrial fibrillation, chronic ([place]) 03/03/2021   Chronic anticoagulation 03/03/2021   DM (diabetes mellitus), type 2 with neurological complications ([place]) [id number]   Essential hypertension 03/03/2021   Hypokalemia 03/03/2021   Prolonged QT interval 03/03/2021   COPD (chronic obstructive pulmonary disease) ([place]) 03/03/2021   Chronic CHF (congestive heart failure) ([place]) 03/03/2021    Diabetic peripheral neuropathy ([place]) 01/03/2019   Obesity 06/01/2016   Wound dehiscence 05/04/2016   Knee osteoarthritis 04/06/2016   Past Medical History:  Diagnosis Date   Arthritis    "hands primarily" (05/05/2016)   Atrial fibrillation (HCC)    Back pain    age related   CHF (congestive heart failure) ([place])    no problemes since [id number]   Complication of anesthesia    Bronchial spasms   COPD (chronic obstructive pulmonary disease) ([facility])    inhalers as needed   Coronary artery disease    Depression    takes Zoloft daily   Diabetic peripheral neuropathy ([place]) 01/03/2019   Dysrhythmia    A Fib-takes Xarelto daily   Enlarged prostate    benign   GERD (gastroesophageal reflux disease)    takes Omeprazole daily   History of blood clots    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)   Hyperlipidemia    takes Atorvastatin daily   Hypertension    takes Metoprolol,HCTZ,and Losartan daily.Takes Clonidine if needed   Nocturia    Peripheral neuropathy    takes Gabapentin daily   Peripheral vascular disease (HCC)    legs blockage    Pneumonia ?2015   Restless leg    Sleep apnea    uses Bipap   Stroke [facility]) 2012   denies residual on 05/05/2016    Family History  Problem Relation Age of Onset   CVA Mother    Liver cancer Father     Past Surgical History:  Procedure Laterality Date   AMPUTATION Left 04/03/2021   Procedure: LEFT 2ND RAY AMPUTATION;  Surgeon: Newt Minion, MD;  Location: [place];  Service: Orthopedics;  Laterality: Left;   CARPAL TUNNEL RELEASE     CATARACT EXTRACTION W/ INTRAOCULAR LENS  IMPLANT, BILATERAL Bilateral    COLONOSCOPY WITH PROPOFOL N/A 03/09/2017   Procedure: COLONOSCOPY WITH PROPOFOL;  Surgeon: Wonda Horner, MD;  Location[facility] ENDOSCOPY;  Service: Endoscopy;  Laterality: N/A;   COLONOSCOPY WITH PROPOFOL N/A 04/03/2020   Procedure: COLONOSCOPY WITH PROPOFOL;  Surgeon: Wonda Horner, MD;  Location: [facility] ENDOSCOPY;  Service: Endoscopy;  Laterality: N/A;    I & D KNEE WITH POLY EXCHANGE Right 05/04/2016   IRRIGATION AND DEBRIDEMENT Right KNEE WITH UNICOMPARTMENTAL POLY EXCHANGE   I & D KNEE WITH POLY EXCHANGE Right 05/04/2016   Procedure: IRRIGATION AND DEBRIDEMENT Right KNEE WITH UNICOMPARTMENTAL POLY EXCHANGE;  Surgeon: Renette Butters, MD;  Location: [place];  Service: Orthopedics;  Laterality: Right;   JOINT REPLACEMENT     LAPAROSCOPIC CHOLECYSTECTOMY  1995   PARTIAL KNEE ARTHROPLASTY Right 04/06/2016   Procedure: UNICOMPARTMENTAL RIGHT KNEE;  Surgeon: Renette Butters, MD;  Location: [place];  Service: Orthopedics;  Laterality: Right;   POLYPECTOMY  04/03/2020   Procedure: POLYPECTOMY;  Surgeon: Wonda Horner, MD;  Location: [facility] ENDOSCOPY;  Service: Endoscopy;;   REVERSE SHOULDER ARTHROPLASTY Left 04/29/2021   Procedure: Revision antibiotic spacer placement. Incision and drainage.;  Surgeon: [name], MD;  Location: [facility];  Service: Orthopedics;  Laterality: Left;   STRABISMUS SURGERY Bilateral 06/10/2017   Procedure: REPAIR STRABISMUS BILATERAL;  Surgeon: Everitt Amber, MD;  Location: [place];  Service: Ophthalmology;  Laterality: Bilateral;   TONSILLECTOMY AND ADENOIDECTOMY     TOTAL KNEE ARTHROPLASTY Left 2008   TOTAL SHOULDER ARTHROPLASTY Left 03/04/2021   Procedure: Total Shoulder Arthroplasty;  Surgeon: [name], MD;  Location: [facility];  Service: Orthopedics;  Laterality: Left;   ULNAR NERVE TRANSPOSITION Right 1989   Social History   Occupational History   Occupation: Retired Therapist, sports  Tobacco Use   Smoking status: Former    Packs/day: 0.50    Years: 40.00    Pack years: 20.00    Types: Cigarettes    Quit date: 07/15/2011    Years since quitting: 9.9   Smokeless tobacco: Former    Types: Nurse, children's Use: Never used  Substance and Sexual Activity   Alcohol use: Yes    Comment: maybe 2 glases wine every other week   Drug use: No   Sexual activity: Yes

## 2021-07-07 MED ORDER — ALLOPURINOL 100 MG PO TABS: 200.0000 mg | ORAL_TABLET | Freq: Two times a day (BID) | ORAL | 12 refills | Status: DC

## 2021-07-07 NOTE — Addendum Note (Signed)
Addended by: Suzan Slick on: 07/07/2021 08:24 AM   Modules accepted: Orders

## 2021-07-08 DIAGNOSIS — L820 Inflamed seborrheic keratosis: Secondary | ICD-10-CM | POA: Diagnosis not present

## 2021-07-11 ENCOUNTER — Ambulatory Visit
Admission: RE | Admit: 2021-07-11 | Discharge: 2021-07-11 | Disposition: A | Discharge status: Home / Self Care | Payer: Medicare Other | Source: Ambulatory Visit | Attending: Orthopaedic Surgery | Admitting: Orthopaedic Surgery

## 2021-07-11 ENCOUNTER — Other Ambulatory Visit: Payer: Self-pay

## 2021-07-11 DIAGNOSIS — M25512 Pain in left shoulder: Secondary | ICD-10-CM

## 2021-07-11 DIAGNOSIS — M25412 Effusion, left shoulder: Secondary | ICD-10-CM | POA: Diagnosis not present

## 2021-07-11 DIAGNOSIS — M75122 Complete rotator cuff tear or rupture of left shoulder, not specified as traumatic: Secondary | ICD-10-CM | POA: Diagnosis not present

## 2021-07-11 DIAGNOSIS — Z96612 Presence of left artificial shoulder joint: Secondary | ICD-10-CM | POA: Diagnosis not present

## 2021-07-14 ENCOUNTER — Encounter: Payer: Self-pay | Admitting: Infectious Disease

## 2021-07-14 ENCOUNTER — Ambulatory Visit: Payer: Medicare Other | Admitting: Infectious Disease

## 2021-07-14 ENCOUNTER — Other Ambulatory Visit: Payer: Self-pay

## 2021-07-14 VITALS — HR 66 | Temp 98.1°F | Ht 72.0 in | Wt 285.0 lb

## 2021-07-14 DIAGNOSIS — Z96653 Presence of artificial knee joint, bilateral: Secondary | ICD-10-CM

## 2021-07-14 DIAGNOSIS — M00812 Arthritis due to other bacteria, left shoulder: Secondary | ICD-10-CM | POA: Diagnosis not present

## 2021-07-14 DIAGNOSIS — I482 Chronic atrial fibrillation, unspecified: Secondary | ICD-10-CM

## 2021-07-14 DIAGNOSIS — Z7901 Long term (current) use of anticoagulants: Secondary | ICD-10-CM

## 2021-07-14 DIAGNOSIS — E790 Hyperuricemia without signs of inflammatory arthritis and tophaceous disease: Secondary | ICD-10-CM | POA: Insufficient documentation

## 2021-07-14 DIAGNOSIS — M869 Osteomyelitis, unspecified: Secondary | ICD-10-CM

## 2021-07-14 DIAGNOSIS — M8660 Other chronic osteomyelitis, unspecified site: Secondary | ICD-10-CM

## 2021-07-14 HISTORY — DX: Hyperuricemia without signs of inflammatory arthritis and tophaceous disease: E79.0

## 2021-07-14 NOTE — Progress Notes (Signed)
Subjective:   Chief Complaint: still has significant left shoulder pain   Patient ID: Willie Pineda, male    DOB: Jun 25, 1946, 75 y.o.   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 Baxter Flattery and Dr. Linus Salmons in 2017), who is being prepared for possible shoulder replacement surgery with Dr. Griffin Basil when a CT and MRI scan were done which showed a septic shoulder and abscess.  MRI specifically showed a large complex joint effusion with enhancing synovitis with high-grade partial-thickness cartilage loss of the glenohumeral joint subchondral bone marrow edema in the inferior aspect of the glenoid likely to osteomyelitis, tear of the supraspinatus infraspinatus tendons complete tear of the subscapularis tendon and dislocation long head of biceps tendon and severe synovitis of the long head of the biceps tendon which also thought to possibly represent intra-articular infection.   Of note he had injured this shoulder when he had fallen from his horse a year ago in December and struck the ground quite hard but without clear breaks in the skin but with extensive bruising.  He also told me that when he was younger he had a horse pull him through backwards and injured the same shoulder.   The patient had been seen by February 19 2021  where he had developed a soft tissue infection which was debrided in the podiatry office.  He was given 10 days of doxycycline which he was on during admission to the hospital.   Note this was also a site of prior injury where horse and stepped on his toe.   Taken to the operating room by Dr. Griffin Basil where he underwent left shoulder I&D with debridement of abscess and also removal of bone from an area of  osteomyelitis involving the humeral head incision debridement of open septic shoulder synovectomy I&D of the [facility] joint with resection of part of the distal clavicle where there was osteomyelitis and placement of a left shoulder antibiotic spacer hemiarthroplasty.   Multiple cultures were done including for organisms that are more subacute in presentation such as nontuberculous back mycobacteria and fungi.  He was initially on vancomycin and aztreonam and metronidazole but later discharged on oral doxycycline and levofloxacin he has subsequently grown Propionibacterium acnes on multiple cultures.   We narrowed him to doxycycline.   Shoulder pain had initially improved.Marland Kitchen  His footwas appearing stable but he had not yet seen podiatry.   I obtained plain films which showed bony resorptive change in the distal phalanx of second digit concerning for osteomyelitis.  He also had a regular arthropathy of the TMT joints.   Patient was eval by Meridee Score and underwent left second ray amputation.   No cultures were taken.   He had been doing relatively well though he had some problem with bleeding at the operative site.   He is ollowed very closely by orthopedic surgery.   He then developed significant left-sided shoulder pain that was new in nature before he was experiencing more of a dull pain that he rated about 3 out of 10 in severity exacerbated by movement.   He requested MRI of the shoulder which we ordered.  MRI showed a moderate sized rim-enhancing effusion with thick enhancement posteriorly along the glenohumeral joint, there is also some enhancement along  the humeral stem of the antibiotic spacer consistent with postoperative change versus osteomyelitis which we would formant favored to be the former.   He was taken the operating room by Dr. Griffin Basil.   He performed I&D and did encounter purulent material in the operating room.  This was sent for culture.  He completed resection of thick  synovial capsule with synovectomy.  Capsule bone and purulent and fascia material were debrided antibiotic spacer was removed and the bone in the medullary canal was debrided.  Area was irrigated and local vancomycin powder was placed along with a spacer with vancomycin and gentamicin's powder.  Cultures taken did not yield any organism though there was mention by my partner Dr. Linus Salmons of a gram-negative rod having been seen on gram stain.   Patient was placed on ceftriaxone with plans for 3 weeks of treatment.  His shoulder pain is dramatically improved .  He saw Dr. Sharol Given and sutures were removed.  I double checked with the microbiology lab and there were no gram-negative rod seen on review of the Gram stain when reviewed by the microbiology staff.  Cultures never yielded an organism either.  I had him extend his antibiotics through today so that I could reassess him.  His shoulder pain continued to improve as does his range of motion at the last visit.    He has RBBB, persistent atrial fibrillation and his cardiologist Dimple Nanas is considering biventricular defibrillator versus subcutaneous ICD.   He ultimately is going to undergo reverse shoulder arthroplasty with Dr. Griffin Basil but would like to reduce risk of anesthesiology by optimizing his cardiac function.  I have myself quite comfortable with him undergoing placement of a biventricular pacemaker.   His shoulder infection is under control and I will continue oral abx,  I am not anxious about it spreading to his bloodstream.  I have not seen any evidence of him becoming or having been bacteremic at any point in time.  Since I last saw Rush Landmark he says his shoulder pain is "bad" but is not worse than when I last saw him.   ICD placement is upcoming and this will be followed by reverse shoulder arthoplasty in mid September.   Past Medical History:  Diagnosis Date   Arthritis    "hands primarily" (05/05/2016)   Atrial  fibrillation (HCC)    Back pain    age related   CHF (congestive heart failure) ([place])    no problemes since 1157   Complication of anesthesia    Bronchial spasms   COPD (chronic obstructive pulmonary disease) (HCC)    inhalers as needed   Coronary artery disease    Depression    takes Zoloft daily   Diabetic peripheral neuropathy ([place]) 01/03/2019   Dysrhythmia    A Fib-takes Xarelto daily   Enlarged prostate    benign   GERD (gastroesophageal reflux disease)    takes Omeprazole daily   History of blood clots    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)   Hyperlipidemia    takes Atorvastatin daily   Hypertension    takes Metoprolol,HCTZ,and Losartan daily.Takes Clonidine if needed   Hyperuricemia 07/14/2021   Nocturia    Peripheral neuropathy    takes Gabapentin daily   Peripheral vascular disease (HCC)    legs blockage    Pneumonia ?2015   Restless leg    Sleep apnea    uses Bipap   Stroke [facility]) 2012   denies residual on 05/05/2016  Past Surgical History:  Procedure Laterality Date   AMPUTATION Left 04/03/2021   Procedure: LEFT 2ND RAY AMPUTATION;  Surgeon: Newt Minion, MD;  Location: [place];  Service: Orthopedics;  Laterality: Left;   CARPAL TUNNEL RELEASE     CATARACT EXTRACTION W/ INTRAOCULAR LENS  IMPLANT, BILATERAL Bilateral    COLONOSCOPY WITH PROPOFOL N/A 03/09/2017   Procedure: COLONOSCOPY WITH PROPOFOL;  Surgeon: Wonda Horner, MD;  Location: [facility] ENDOSCOPY;  Service: Endoscopy;  Laterality: N/A;   COLONOSCOPY WITH PROPOFOL N/A 04/03/2020   Procedure: COLONOSCOPY WITH PROPOFOL;  Surgeon: Wonda Horner, MD;  Location: [facility] ENDOSCOPY;  Service: Endoscopy;  Laterality: N/A;   I & D KNEE WITH POLY EXCHANGE Right 05/04/2016   IRRIGATION AND DEBRIDEMENT Right KNEE WITH UNICOMPARTMENTAL POLY EXCHANGE   I & D KNEE WITH POLY EXCHANGE Right 05/04/2016   Procedure: IRRIGATION AND DEBRIDEMENT Right KNEE WITH UNICOMPARTMENTAL POLY EXCHANGE;  Surgeon: Renette Butters, MD;   Location: [place];  Service: Orthopedics;  Laterality: Right;   JOINT REPLACEMENT     LAPAROSCOPIC CHOLECYSTECTOMY  1995   PARTIAL KNEE ARTHROPLASTY Right 04/06/2016   Procedure: UNICOMPARTMENTAL RIGHT KNEE;  Surgeon: Renette Butters, MD;  Location: [place];  Service: Orthopedics;  Laterality: Right;   POLYPECTOMY  04/03/2020   Procedure: POLYPECTOMY;  Surgeon: Wonda Horner, MD;  Location: [facility] ENDOSCOPY;  Service: Endoscopy;;   REVERSE SHOULDER ARTHROPLASTY Left 04/29/2021   Procedure: Revision antibiotic spacer placement. Incision and drainage.;  Surgeon: [name], MD;  Location: [facility]S;  Service: Orthopedics;  Laterality: Left;   STRABISMUS SURGERY Bilateral 06/10/2017   Procedure: REPAIR STRABISMUS BILATERAL;  Surgeon: Everitt Amber, MD;  Location: [place];  Service: Ophthalmology;  Laterality: Bilateral;   TONSILLECTOMY AND ADENOIDECTOMY     TOTAL KNEE ARTHROPLASTY Left 2008   TOTAL SHOULDER ARTHROPLASTY Left 03/04/2021   Procedure: Total Shoulder Arthroplasty;  Surgeon: [name], MD;  Location: [facility];  Service: Orthopedics;  Laterality: Left;   ULNAR NERVE TRANSPOSITION Right 1989    Family History  Problem Relation Age of Onset   CVA Mother    Liver cancer Father       Social History   Socioeconomic History   Marital status: Married    Spouse name: Not on file   Number of children: Not on file   Years of education: Not on file   Highest education level: Not on file  Occupational History   Occupation: Retired Therapist, sports  Tobacco Use   Smoking status: Former    Packs/day: 0.50    Years: 40.00    Pack years: 20.00    Types: Cigarettes    Quit date: 07/15/2011    Years since quitting: 10.0   Smokeless tobacco: Former    Types: Nurse, children's Use: Never used  Substance and Sexual Activity   Alcohol use: Yes    Comment: maybe 2 glases wine every other week   Drug use: No   Sexual activity: Yes  Other Topics Concern   Not on file  Social  History Narrative   Lives at home with spouse    Caffeine 2 cups of coffee daily    Right handed.    Social Determinants of Health   Financial Resource Strain: Not on file  Food Insecurity: Not on file  Transportation Needs: Not on file  Physical Activity: Not on file  Stress: Not on file  Social Connections: Not on file  Allergies  Allergen Reactions   Penicillins Anaphylaxis    Reports an anaphylactic reaction when he was 13 to IM penicillin.  Has tolerated multiple cephalosporins in the past.   Tolerated Cephalosporin Date: 04/30/21.     Alfalfa    Dymista [Azelastine-Fluticasone] Other (See Comments)    Burning sensation    Flonase [Fluticasone Propionate] Other (See Comments)    burning   Hydrocodone Other (See Comments)    SWEATING   Warfarin And Related Other (See Comments)    Pt states it makes his INR critical levels     Current Outpatient Medications:    ACCU-CHEK GUIDE test strip, TEST DAILY AS DIRECTED, Disp: , Rfl:    Accu-Chek Softclix Lancets lancets, daily., Disp: , Rfl:    allopurinol (ZYLOPRIM) 100 MG tablet, Take 2 tablets (200 mg total) by mouth 2 (two) times daily., Disp: 120 tablet, Rfl: 12   atorvastatin (LIPITOR) 10 MG tablet, Take 10 mg by mouth every other day. In the evening, Disp: , Rfl: 6   carboxymethylcellulose (REFRESH PLUS) 0.5 % SOLN, Place 1 drop into both eyes 3 (three) times daily as needed (dry/irritated eyes)., Disp: , Rfl:    cloNIDine (CATAPRES) 0.1 MG tablet, Take 0.1 mg by mouth daily as needed (Only takes if pt if bp  > 160)., Disp: , Rfl:    colchicine 0.6 MG tablet, Take 1 tablet (0.6 mg total) by mouth daily. As needed for gout pain. (Patient taking differently: Take 0.6 mg by mouth daily as needed (gout pain).), Disp: 60 tablet, Rfl: 12   Cyanocobalamin (VITAMIN B 12) 500 MCG TABS, Take 500 mcg by mouth in the morning., Disp: , Rfl:    cyclobenzaprine (FLEXERIL) 10 MG tablet, Take 10 mg by mouth at bedtime as needed for  muscle spasms. , Disp: , Rfl:    diclofenac Sodium (VOLTAREN) 1 % GEL, Apply 2 g topically 2 (two) times daily as needed (pain)., Disp: , Rfl:    ENTRESTO 24-26 MG, Take 1 tablet by mouth 2 (two) times daily., Disp: , Rfl:    gabapentin (NEURONTIN) 300 MG capsule, Take 900 mg by mouth 2 (two) times daily., Disp: , Rfl:    metolazone (ZAROXOLYN) 2.5 MG tablet, Take 2.5 mg by mouth daily as needed (fluid retention)., Disp: , Rfl:    metoprolol succinate (TOPROL-XL) 50 MG 24 hr tablet, Take 50 mg by mouth in the morning. Take with or immediately following a meal., Disp: , Rfl:    minocycline (MINOCIN) 100 MG capsule, Take 1 capsule (100 mg total) by mouth 2 (two) times daily., Disp: 60 capsule, Rfl: 6   omeprazole (PRILOSEC) 20 MG capsule, Take 20 mg by mouth in the morning and at bedtime., Disp: , Rfl:    oxyCODONE (OXY IR/ROXICODONE) 5 MG immediate release tablet, SMARTSIG:1-2 Pill By Mouth Every 6 Hours PRN, Disp: , Rfl:    potassium chloride (MICRO-K) 10 MEQ CR capsule, Take 10 mEq by mouth in the morning., Disp: , Rfl:    rivaroxaban (XARELTO) 20 MG TABS tablet, Take 20 mg by mouth in the morning., Disp: , Rfl:    Semaglutide, 1 MG/DOSE, (OZEMPIC, 1 MG/DOSE,) 2 MG/1.5ML SOPN, Inject into the skin., Disp: , Rfl:    sertraline (ZOLOFT) 100 MG tablet, Take 100 mg by mouth in the morning., Disp: , Rfl:    spironolactone (ALDACTONE) 25 MG tablet, Take 25 mg by mouth in the morning., Disp: , Rfl:    torsemide (DEMADEX) 100 MG tablet, Take 100 mg  by mouth in the morning., Disp: , Rfl:    mupirocin ointment (BACTROBAN) 2 %, Apply topically 3 (three) times daily., Disp: , Rfl:    nitroGLYCERIN (NITRODUR - DOSED IN MG/24 HR) 0.2 mg/hr patch, Place 1 patch (0.2 mg total) onto the skin daily. (Patient not taking: Reported on 07/14/2021), Disp: 30 patch, Rfl: 12     Review of Systems  Constitutional:  Negative for activity change, appetite change, chills, diaphoresis, fatigue, fever and unexpected weight  change.  HENT:  Negative for congestion, rhinorrhea, sinus pressure, sneezing, sore throat and trouble swallowing.   Eyes:  Negative for photophobia and visual disturbance.  Respiratory:  Negative for cough, chest tightness, shortness of breath, wheezing and stridor.   Cardiovascular:  Negative for chest pain, palpitations and leg swelling.  Gastrointestinal:  Negative for abdominal distention, abdominal pain, anal bleeding, blood in stool, constipation, diarrhea, nausea and vomiting.  Genitourinary:  Negative for difficulty urinating, dysuria, flank pain and hematuria.  Musculoskeletal:  Positive for arthralgias. Negative for back pain, gait problem, joint swelling and myalgias.  Skin:  Positive for wound. Negative for color change, pallor and rash.  Neurological:  Negative for dizziness, tremors, weakness and light-headedness.  Hematological:  Negative for adenopathy. Does not bruise/bleed easily.  Psychiatric/Behavioral:  Negative for agitation, behavioral problems, confusion, decreased concentration, dysphoric mood and sleep disturbance.       Objective:   Physical Exam Constitutional:      Appearance: He is well-developed.  HENT:     Head: Normocephalic and atraumatic.  Eyes:     Conjunctiva/sclera: Conjunctivae normal.  Cardiovascular:     Rate and Rhythm: Normal rate and regular rhythm.  Pulmonary:     Effort: Pulmonary effort is normal. No respiratory distress.     Breath sounds: No wheezing.  Abdominal:     General: There is no distension.     Palpations: Abdomen is soft.  Musculoskeletal:        General: No tenderness. Normal range of motion.     Cervical back: Normal range of motion and neck supple.  Skin:    General: Skin is warm and dry.     Coloration: Skin is not pale.     Findings: No erythema or rash.  Neurological:     General: No focal deficit present.     Mental Status: He is alert and oriented to person, place, and time.  Psychiatric:        Mood and  Affect: Mood normal.        Behavior: Behavior normal.        Thought Content: Thought content normal.        Judgment: Judgment normal.    Left shoulder is stable  Left foot 06/11/2021:    Left foot 07/14/2021:         Assessment & Plan:   Left septic shoulder with radiographic evidence of osteomyelitis of humerus on imaging  Propionibacterium was isolated previously  I have personally reviewed the CT of the shoulder which was ordered by Dr. Griffin Basil and performed on 07/11/2021 which DOES show lucency around the stem of the spacer which could indeed be either due to surgery or could in fact be osteomyelitis  We will obtain ESR and CRP along with notes from Dr. Griffin Basil  I will continue his minocycline   Diabetic foot infection with osteomyelitis now sp 2nd ray amputation  Seems to be healing up well  Hyperuricemia: Dr Sharol Given believes patient is suffering from gout and he is  on colchicine and allopurinol. His symptoms are not classic for gout in terms of degree of pain he has had in his fingers and other join ts  Persistent atrial fibrillation: I am supportive of ICD and EP intervention

## 2021-07-17 ENCOUNTER — Other Ambulatory Visit: Payer: Medicare Other

## 2021-07-24 DIAGNOSIS — Z01812 Encounter for preprocedural laboratory examination: Secondary | ICD-10-CM | POA: Diagnosis not present

## 2021-07-24 DIAGNOSIS — I482 Chronic atrial fibrillation, unspecified: Secondary | ICD-10-CM | POA: Diagnosis not present

## 2021-07-24 DIAGNOSIS — Z7901 Long term (current) use of anticoagulants: Secondary | ICD-10-CM | POA: Diagnosis not present

## 2021-07-27 DIAGNOSIS — J449 Chronic obstructive pulmonary disease, unspecified: Secondary | ICD-10-CM | POA: Diagnosis not present

## 2021-07-27 DIAGNOSIS — E1169 Type 2 diabetes mellitus with other specified complication: Secondary | ICD-10-CM | POA: Diagnosis not present

## 2021-07-27 DIAGNOSIS — E119 Type 2 diabetes mellitus without complications: Secondary | ICD-10-CM | POA: Diagnosis not present

## 2021-07-27 DIAGNOSIS — I517 Cardiomegaly: Secondary | ICD-10-CM | POA: Diagnosis not present

## 2021-07-27 DIAGNOSIS — Z95818 Presence of other cardiac implants and grafts: Secondary | ICD-10-CM | POA: Diagnosis not present

## 2021-07-27 DIAGNOSIS — M869 Osteomyelitis, unspecified: Secondary | ICD-10-CM | POA: Diagnosis not present

## 2021-07-27 DIAGNOSIS — I110 Hypertensive heart disease with heart failure: Secondary | ICD-10-CM | POA: Diagnosis not present

## 2021-07-27 DIAGNOSIS — I428 Other cardiomyopathies: Secondary | ICD-10-CM | POA: Diagnosis not present

## 2021-07-27 DIAGNOSIS — G4733 Obstructive sleep apnea (adult) (pediatric): Secondary | ICD-10-CM | POA: Diagnosis not present

## 2021-07-27 DIAGNOSIS — G8929 Other chronic pain: Secondary | ICD-10-CM | POA: Diagnosis not present

## 2021-07-27 DIAGNOSIS — I509 Heart failure, unspecified: Secondary | ICD-10-CM | POA: Diagnosis not present

## 2021-07-27 DIAGNOSIS — Z87891 Personal history of nicotine dependence: Secondary | ICD-10-CM | POA: Diagnosis not present

## 2021-07-27 DIAGNOSIS — I5022 Chronic systolic (congestive) heart failure: Secondary | ICD-10-CM | POA: Diagnosis not present

## 2021-07-27 DIAGNOSIS — E785 Hyperlipidemia, unspecified: Secondary | ICD-10-CM | POA: Diagnosis not present

## 2021-07-27 DIAGNOSIS — Z006 Encounter for examination for normal comparison and control in clinical research program: Secondary | ICD-10-CM | POA: Diagnosis not present

## 2021-07-27 DIAGNOSIS — Z79891 Long term (current) use of opiate analgesic: Secondary | ICD-10-CM | POA: Diagnosis not present

## 2021-07-27 DIAGNOSIS — Z79899 Other long term (current) drug therapy: Secondary | ICD-10-CM | POA: Diagnosis not present

## 2021-07-27 DIAGNOSIS — I4819 Other persistent atrial fibrillation: Secondary | ICD-10-CM | POA: Diagnosis not present

## 2021-07-27 DIAGNOSIS — I4510 Unspecified right bundle-branch block: Secondary | ICD-10-CM | POA: Diagnosis not present

## 2021-07-27 DIAGNOSIS — I482 Chronic atrial fibrillation, unspecified: Secondary | ICD-10-CM | POA: Diagnosis not present

## 2021-07-27 DIAGNOSIS — K219 Gastro-esophageal reflux disease without esophagitis: Secondary | ICD-10-CM | POA: Diagnosis not present

## 2021-07-27 DIAGNOSIS — Z8673 Personal history of transient ischemic attack (TIA), and cerebral infarction without residual deficits: Secondary | ICD-10-CM | POA: Diagnosis not present

## 2021-08-12 ENCOUNTER — Telehealth: Payer: Self-pay

## 2021-08-12 NOTE — Telephone Encounter (Signed)
Patient called triage to report that he's experiencing increased pain in his shoulder. Reports that he is 2+ weeks post op of a pacemaker placement and is reporting over the last 4-5 days increased shoulder pain and the last 3-4 days intermittent fevers and malaise. He is negative for COVID. Still taking oral minocycline.   Patient is scheduled for follow up on 9/30 with K[facility]; no earlier appointments available. Forwarding to provider to determine if needs to be seen by different provider.

## 2021-08-13 ENCOUNTER — Other Ambulatory Visit: Payer: Self-pay | Admitting: Infectious Disease

## 2021-08-13 DIAGNOSIS — T8459XA Infection and inflammatory reaction due to other internal joint prosthesis, initial encounter: Secondary | ICD-10-CM | POA: Diagnosis not present

## 2021-08-13 DIAGNOSIS — Z7689 Persons encountering health services in other specified circumstances: Secondary | ICD-10-CM | POA: Diagnosis not present

## 2021-08-13 NOTE — Telephone Encounter (Signed)
RN faxed orders (CRP, ESR and CBC with diff) to [facility]. Scheduled with Dr. Linus Salmons on 9/13 to discuss concerns. Reports increased crepitus at the surgical site. Still feels generally unwell. Patient has surgery scheduled for 9/28.   Willie Wojdyla Lorita Officer, RN

## 2021-08-13 NOTE — Progress Notes (Signed)
Sent message, via epic in basket, requesting orders in epic from surgeon.  

## 2021-08-14 DIAGNOSIS — G4733 Obstructive sleep apnea (adult) (pediatric): Secondary | ICD-10-CM | POA: Diagnosis not present

## 2021-08-14 LAB — CBC/DIFF AMBIGUOUS DEFAULT
Basophils Absolute: 0.1 10*3/uL (ref 0.0–0.2)
Basos: 1 %
EOS (ABSOLUTE): 0.1 10*3/uL (ref 0.0–0.4)
Eos: 2 %
Hematocrit: 45 % (ref 37.5–51.0)
Hemoglobin: 16 g/dL (ref 13.0–17.7)
Immature Grans (Abs): 0 10*3/uL (ref 0.0–0.1)
Immature Granulocytes: 1 %
Lymphocytes Absolute: 2.1 10*3/uL (ref 0.7–3.1)
Lymphs: 26 %
MCH: 33.1 pg — ABNORMAL HIGH (ref 26.6–33.0)
MCHC: 35.6 g/dL (ref 31.5–35.7)
MCV: 93 fL (ref 79–97)
Monocytes Absolute: 0.9 10*3/uL (ref 0.1–0.9)
Monocytes: 11 %
Neutrophils Absolute: 4.8 10*3/uL (ref 1.4–7.0)
Neutrophils: 59 %
Platelets: 184 10*3/uL (ref 150–450)
RBC: 4.84 x10E6/uL (ref 4.14–5.80)
RDW: 13.2 % (ref 11.6–15.4)
WBC: 8 10*3/uL (ref 3.4–10.8)

## 2021-08-14 LAB — C-REACTIVE PROTEIN: CRP: 39 mg/L — ABNORMAL HIGH (ref 0–10)

## 2021-08-14 LAB — SEDIMENTATION RATE: Sed Rate: 25 mm/hr (ref 0–30)

## 2021-08-14 NOTE — Progress Notes (Addendum)
PCP - Henrine Screws, MD Cardiologist - Lamar Blinks, MD  [place] 03-25-21  Dimple Nanas , MD [place] 06-18-21 epic  PPM/ICD - placed 07-27-21 Device Orders -  Rep Notified -   Chest x-ray - 1 view 04-29-21 epic EKG - 03-05-21 epic Stress Test -  ECHO - 03-25-21 care every where Cardiac Cath -  CBC/DIFF-08-13-21 epic  Sleep Study -  CPAP -   Fasting Blood Sugar -  Checks Blood Sugar __2___ times a month  Blood Thinner Instructions: XARELTO  stop 2 days prior Aspirin Instructions:  ERAS Protcol - PRE-SURGERY  G2-   COVID TEST- Ambulatory COVID vaccine - pfizer = boosted  Activity--Able to walk a flight of stairs without SOB Anesthesia review: DM II, CHF,CAD,a-FIB, COPD, OSA CVA, Pacer/ICD placed 07-27-21  Patient denies shortness of breath, fever, cough and chest pain at PAT appointment   All instructions explained to the patient, with a verbal understanding of the material. Patient agrees to go over the instructions while at home for a better understanding. Patient also instructed to self quarantine after being tested for COVID-19. The opportunity to ask questions was provided.

## 2021-08-14 NOTE — Patient Instructions (Addendum)
DUE TO COVID-19 ONLY ONE VISITOR IS ALLOWED TO COME WITH YOU AND STAY IN THE WAITING ROOM ONLY DURING PRE OP AND PROCEDURE DAY OF SURGERY.   TWO VISITOR  MAY VISIT WITH YOU AFTER SURGERY IN YOUR PRIVATE ROOM DURING VISITING HOURS ONLY!  YOU NEED TO HAVE A COVID 19 TEST On___not needed____ '@_______'$ , THIS TEST MUST BE DONE BEFORE SURGERY,     Please bring completed form with you to the COVID testing site   COVID TESTING SITE [place] TEST IS COMPLETED,  PLEASE Wear a mask when in public           Your procedure is scheduled on: 09-02-21   Report to [facility] Main  Entrance   Report to admitting at      0715 AM                              Bring BIPAP Mask and tubing only     Call this number if you have problems the morning of surgery [phone]   Remember: NO SOLID FOOD AFTER MIDNIGHT THE NIGHT PRIOR TO SURGERY. NOTHING BY MOUTH EXCEPT CLEAR LIQUIDS UNTIL   0700 am  . PLEASE FINISH ENSURE DRINK PER SURGEON ORDER  WHICH NEEDS TO BE COMPLETED AT       0700 am then nothing by mouth.      CLEAR LIQUID DIET                                                                    water Black Coffee and tea, regular and decaf                             Plain Jell-O any favor except red or purple                                  Fruit ices (not with fruit pulp)                                      Iced Popsicles                                     Carbonated beverages, regular and diet                                    Cranberry, grape and apple juices Sports drinks like Gatorade Lightly seasoned clear broth or consume(fat free) Sugar, honey syrup  _____________________________________________________________________     BRUSH YOUR TEETH MORNING OF SURGERY AND RINSE YOUR MOUTH OUT, NO CHEWING GUM CANDY OR MINTS.     Take these medicines the morning of surgery with A SIP OF WATER: zoloft, omeprazole,  minocycline, metoprolol,, gabapentin, metoprolol, gabapentin, allopurinol, clonodine if needed  DO NOT  TAKE ANY DIABETIC MEDICATIONS DAY OF YOUR SURGERY                               You may not have any metal on your body including hair pins and              piercings  Do not wear jewelry, , lotions, powders,perfumes,      deodorant                      Men may shave face and neck.   Do not bring valuables to the hospital. Willie Pineda.  Contacts, dentures or bridgework may not be worn into surgery.       Patients discharged the day of surgery will not be allowed to drive home. IF YOU ARE HAVING SURGERY AND GOING HOME THE SAME DAY, YOU MUST HAVE AN ADULT TO DRIVE YOU HOME AND BE WITH YOU FOR 24 HOURS. YOU MAY GO HOME BY TAXI OR UBER OR ORTHERWISE, BUT AN ADULT MUST ACCOMPANY YOU HOME AND STAY WITH YOU FOR 24 HOURS.  Name and phone number of your driver:  Special Instructions: N/A              Please read over the following fact sheets you were given: _____________________________________________________________________ [facility]- Preparing for Total Shoulder Arthroplasty    Before surgery, you can play an important role. Because skin is not sterile, your skin needs to be as free of germs as possible. You can reduce the number of germs on your skin by using the following products. Benzoyl Peroxide Gel Reduces the number of germs present on the skin Applied twice a day to shoulder area starting two days before surgery    ==================================================================  Please follow these instructions carefully:  BENZOYL PEROXIDE 5% GEL  Please do not use if you have an allergy to benzoyl peroxide.   If your skin becomes reddened/irritated stop using the benzoyl peroxide.  Starting two days before surgery, apply as follows: Apply benzoyl peroxide in the morning and at night. Apply after taking a shower. If you are  not taking a shower clean entire shoulder front, back, and side along with the armpit with a clean wet washcloth.  Place a quarter-sized dollop on your shoulder and rub in thoroughly, making sure to cover the front, back, and side of your shoulder, along with the armpit.   2 days before ____ AM   ____ PM              1 day before ____ AM   ____ PM                         Do this twice a day for two days.  (Last application is the night before surgery, AFTER using the CHG soap as described below).  Do NOT apply benzoyl peroxide gel on the day of surgery.             [place] - Preparing for Surgery Before surgery, you can play an important role.  Because skin is not sterile, your skin needs to be as free of germs as possible.  You can reduce the number of germs on your skin by washing with CHG (chlorahexidine gluconate) soap before surgery.  CHG is  an antiseptic cleaner which kills germs and bonds with the skin to continue killing germs even after washing. Please DO NOT use if you have an allergy to CHG or antibacterial soaps.  If your skin becomes reddened/irritated stop using the CHG and inform your nurse when you arrive at Short Stay. Do not shave (including legs and underarms) for at least 48 hours prior to the first CHG shower.  You may shave your face/neck. Please follow these instructions carefully:  1.  Shower with CHG Soap the night before surgery and the  morning of Surgery.  2.  If you choose to wash your hair, wash your hair first as usual with your  normal  shampoo.  3.  After you shampoo, rinse your hair and body thoroughly to remove the  shampoo.                           4.  Use CHG as you would any other liquid soap.  You can apply chg directly  to the skin and wash                       Gently with a scrungie or clean washcloth.  5.  Apply the CHG Soap to your body ONLY FROM THE NECK DOWN.   Do not use on face/ open                           Wound or open sores. Avoid contact  with eyes, ears mouth and genitals (private parts).                       Wash face,  Genitals (private parts) with your normal soap.             6.  Wash thoroughly, paying special attention to the area where your surgery  will be performed.  7.  Thoroughly rinse your body with warm water from the neck down.  8.  DO NOT shower/wash with your normal soap after using and rinsing off  the CHG Soap.                9.  Pat yourself dry with a clean towel.            10.  Wear clean pajamas.            11.  Place clean sheets on your bed the night of your first shower and do not  sleep with pets. Day of Surgery : Do not apply any lotions/deodorants the morning of surgery.  Please wear clean clothes to the hospital/surgery center.  FAILURE TO FOLLOW THESE INSTRUCTIONS MAY RESULT IN THE CANCELLATION OF YOUR SURGERY PATIENT SIGNATURE_________________________________  NURSE SIGNATURE__________________________________  ________________________________________________________________________    Adam Phenix  An incentive spirometer is a tool that can help keep your lungs clear and active. This tool measures how well you are filling your lungs with each breath. Taking long deep breaths may help reverse or decrease the chance of developing breathing (pulmonary) problems (especially infection) following: A long period of time when you are unable to move or be active. BEFORE THE PROCEDURE  If the spirometer includes an indicator to show your best effort, your nurse or respiratory therapist will set it to a desired goal. If possible, sit up straight or lean slightly forward. Try not to slouch. Hold the incentive spirometer in  an upright position. INSTRUCTIONS FOR USE  Sit on the edge of your bed if possible, or sit up as far as you can in bed or on a chair. Hold the incentive spirometer in an upright position. Breathe out normally. Place the mouthpiece in your mouth and seal your lips tightly  around it. Breathe in slowly and as deeply as possible, raising the piston or the ball toward the top of the column. Hold your breath for 3-5 seconds or for as long as possible. Allow the piston or ball to fall to the bottom of the column. Remove the mouthpiece from your mouth and breathe out normally. Rest for a few seconds and repeat Steps 1 through 7 at least 10 times every 1-2 hours when you are awake. Take your time and take a few normal breaths between deep breaths. The spirometer may include an indicator to show your best effort. Use the indicator as a goal to work toward during each repetition. After each set of 10 deep breaths, practice coughing to be sure your lungs are clear. If you have an incision (the cut made at the time of surgery), support your incision when coughing by placing a pillow or rolled up towels firmly against it. Once you are able to get out of bed, walk around indoors and cough well. You may stop using the incentive spirometer when instructed by your caregiver.  RISKS AND COMPLICATIONS Take your time so you do not get dizzy or light-headed. If you are in pain, you may need to take or ask for pain medication before doing incentive spirometry. It is harder to take a deep breath if you are having pain. AFTER USE Rest and breathe slowly and easily. It can be helpful to keep track of a log of your progress. Your caregiver can provide you with a simple table to help with this. If you are using the spirometer at home, follow these instructions: [place] IF:  You are having difficultly using the spirometer. You have trouble using the spirometer as often as instructed. Your pain medication is not giving enough relief while using the spirometer. You develop fever of 100.5 F (38.1 C) or higher. SEEK IMMEDIATE MEDICAL CARE IF:  You cough up bloody sputum that had not been present before. You develop fever of 102 F (38.9 C) or greater. You develop worsening pain  at or near the incision site. MAKE SURE YOU:  Understand these instructions. Will watch your condition. Will get help right away if you are not doing well or get worse. Document Released: 04/04/2007 Document Revised: 02/14/2012 Document Reviewed: 06/05/2007 [facility] Patient Information 2014 [place], [place].   ________________________________________________________________________

## 2021-08-18 ENCOUNTER — Ambulatory Visit: Payer: Medicare Other | Admitting: Internal Medicine

## 2021-08-18 ENCOUNTER — Other Ambulatory Visit: Payer: Self-pay

## 2021-08-18 ENCOUNTER — Encounter: Payer: Self-pay | Admitting: Internal Medicine

## 2021-08-18 VITALS — BP 109/70 | HR 80 | Resp 16 | Ht 72.0 in | Wt 287.0 lb

## 2021-08-18 DIAGNOSIS — I482 Chronic atrial fibrillation, unspecified: Secondary | ICD-10-CM | POA: Diagnosis not present

## 2021-08-18 DIAGNOSIS — M009 Pyogenic arthritis, unspecified: Secondary | ICD-10-CM | POA: Diagnosis not present

## 2021-08-18 NOTE — Progress Notes (Signed)
   Subjective:    Patient ID: Willie Pineda, male    DOB: 1946/02/01, 75 y.o.   MRN: [id number]  HPI Here for a work in visit. He has a history of a right prosthetic knee infection in 2017 and was evaluated for a left shoulder infection and noted on MRI a concern for a septic shoulder and abscess.  He underwent left shoulder I and D by Dr. Griffin Basil of the [facility] joint with resection of part of the distal clavicle due to osteomyelitis and had an antibiotic spacer placed.  His culture did grow Proprionibacterium and continued on doxycycline.  He is here today with more shoulder pain than it has been and concern for infection again.  Recently had a pacemaker placed.  Had crp, ESR and cbc done recently.    Review of Systems  Constitutional:  Negative for fatigue.  Gastrointestinal:  Negative for diarrhea and nausea.  Skin:  Negative for rash.      Objective:   Physical Exam Musculoskeletal:     Comments: Left shoulder with no significant warmth, no erythema.  Unable to participate in ROM due to the previous surgery.  No tenderness  Skin:    Findings: No rash.          Assessment & Plan:

## 2021-08-18 NOTE — Assessment & Plan Note (Signed)
Pacemaker placed at [facility] and records reviewed.

## 2021-08-18 NOTE — Assessment & Plan Note (Signed)
No new concerns on exam and ESR, CRP reassuring.  No indication from my standpoint for further testing  He will return here as needed

## 2021-08-19 DIAGNOSIS — E1165 Type 2 diabetes mellitus with hyperglycemia: Secondary | ICD-10-CM | POA: Diagnosis not present

## 2021-08-19 DIAGNOSIS — I509 Heart failure, unspecified: Secondary | ICD-10-CM | POA: Diagnosis not present

## 2021-08-19 DIAGNOSIS — R202 Paresthesia of skin: Secondary | ICD-10-CM | POA: Diagnosis not present

## 2021-08-19 DIAGNOSIS — M25512 Pain in left shoulder: Secondary | ICD-10-CM | POA: Diagnosis not present

## 2021-08-19 DIAGNOSIS — I4891 Unspecified atrial fibrillation: Secondary | ICD-10-CM | POA: Diagnosis not present

## 2021-08-19 DIAGNOSIS — I10 Essential (primary) hypertension: Secondary | ICD-10-CM | POA: Diagnosis not present

## 2021-08-20 ENCOUNTER — Encounter (HOSPITAL_COMMUNITY)
Admission: RE | Admit: 2021-08-20 | Discharge: 2021-08-20 | Disposition: A | Discharge status: Home / Self Care | Payer: Medicare Other | Source: Ambulatory Visit | Attending: Orthopaedic Surgery | Admitting: Orthopaedic Surgery

## 2021-08-20 ENCOUNTER — Other Ambulatory Visit: Payer: Self-pay

## 2021-08-20 ENCOUNTER — Encounter (HOSPITAL_COMMUNITY): Payer: Self-pay

## 2021-08-20 DIAGNOSIS — K219 Gastro-esophageal reflux disease without esophagitis: Secondary | ICD-10-CM | POA: Insufficient documentation

## 2021-08-20 DIAGNOSIS — Z87891 Personal history of nicotine dependence: Secondary | ICD-10-CM | POA: Insufficient documentation

## 2021-08-20 DIAGNOSIS — Z79899 Other long term (current) drug therapy: Secondary | ICD-10-CM | POA: Insufficient documentation

## 2021-08-20 DIAGNOSIS — I10 Essential (primary) hypertension: Secondary | ICD-10-CM | POA: Insufficient documentation

## 2021-08-20 DIAGNOSIS — I428 Other cardiomyopathies: Secondary | ICD-10-CM | POA: Insufficient documentation

## 2021-08-20 DIAGNOSIS — I4891 Unspecified atrial fibrillation: Secondary | ICD-10-CM | POA: Diagnosis not present

## 2021-08-20 DIAGNOSIS — J449 Chronic obstructive pulmonary disease, unspecified: Secondary | ICD-10-CM | POA: Insufficient documentation

## 2021-08-20 DIAGNOSIS — G4730 Sleep apnea, unspecified: Secondary | ICD-10-CM | POA: Diagnosis not present

## 2021-08-20 DIAGNOSIS — Z7901 Long term (current) use of anticoagulants: Secondary | ICD-10-CM | POA: Insufficient documentation

## 2021-08-20 DIAGNOSIS — Z95810 Presence of automatic (implantable) cardiac defibrillator: Secondary | ICD-10-CM | POA: Diagnosis not present

## 2021-08-20 DIAGNOSIS — I2510 Atherosclerotic heart disease of native coronary artery without angina pectoris: Secondary | ICD-10-CM | POA: Diagnosis not present

## 2021-08-20 DIAGNOSIS — Z01812 Encounter for preprocedural laboratory examination: Secondary | ICD-10-CM | POA: Insufficient documentation

## 2021-08-20 DIAGNOSIS — M19012 Primary osteoarthritis, left shoulder: Secondary | ICD-10-CM | POA: Insufficient documentation

## 2021-08-20 HISTORY — DX: Gout, unspecified: M10.9

## 2021-08-20 HISTORY — DX: Presence of automatic (implantable) cardiac defibrillator: Z95.810

## 2021-08-20 HISTORY — DX: Presence of cardiac pacemaker: Z95.0

## 2021-08-20 LAB — SURGICAL PCR SCREEN
MRSA, PCR: NEGATIVE
Staphylococcus aureus: NEGATIVE

## 2021-08-20 LAB — BASIC METABOLIC PANEL
Anion gap: 9 (ref 5–15)
BUN: 28 mg/dL — ABNORMAL HIGH (ref 8–23)
CO2: 29 mmol/L (ref 22–32)
Calcium: 9.5 mg/dL (ref 8.9–10.3)
Chloride: 98 mmol/L (ref 98–111)
Creatinine, Ser: 1.11 mg/dL (ref 0.61–1.24)
GFR, Estimated: >60 mL/min (ref >60)
Glucose, Bld: 136 mg/dL — ABNORMAL HIGH (ref 70–99)
Potassium: 3.8 mmol/L (ref 3.5–5.1)
Sodium: 136 mmol/L (ref 135–145)

## 2021-08-20 LAB — CBC
HCT: 48.7 % (ref 39.0–52.0)
Hemoglobin: 16.2 g/dL (ref 13.0–17.0)
MCH: 32.9 pg (ref 26.0–34.0)
MCHC: 33.3 g/dL (ref 30.0–36.0)
MCV: 99 fL (ref 80.0–100.0)
Platelets: 196 10*3/uL (ref 150–400)
RBC: 4.92 MIL/uL (ref 4.22–5.81)
RDW: 14.2 % (ref 11.5–15.5)
WBC: 10.4 10*3/uL (ref 4.0–10.5)
nRBC: 0 % (ref 0.0–0.2)

## 2021-08-20 LAB — GLUCOSE, CAPILLARY: Glucose-Capillary: 155 mg/dL — ABNORMAL HIGH (ref 70–99)

## 2021-08-20 LAB — HEMOGLOBIN A1C
Hgb A1c MFr Bld: 6.5 % — ABNORMAL HIGH (ref 4.8–5.6)
Mean Plasma Glucose: 139.85 mg/dL

## 2021-08-21 ENCOUNTER — Encounter (HOSPITAL_COMMUNITY): Payer: Self-pay | Admitting: Physician Assistant

## 2021-08-24 NOTE — Progress Notes (Signed)
Anesthesia Chart Review   Case: [id number] Date/Time: 09/02/21 0945   Procedure: TOTAL SHOULDER ARTHROPLASTY (Left: Shoulder)   Anesthesia type: Choice   Pre-op diagnosis: DJD LEFT SHOULDER/ FAILED HARDWARE   Location[facility] ROOM 06 / [facility]   Surgeons: [name], MD       DISCUSSION:75 y.o. former smoker with h/o GERD, HTN, COPD, sleep apnea (on bipap), atrial fibrillation (on Xarelto), stroke, NICM (EF 25-30% on Echo 03/25/2021, AICD placed 07/27/21), RBBB, CAD, left shoulder djd/failed hardware scheduled for above procedure 09/02/2021 with Dr. [name].   S/p left total shoulder arthroplasty 03/04/2021. Per Anesthesia notes, "Had issues with bronchospasm during anesthesia with one of his cscopes in past at a day surgery center- can't remember if it was before or after his 2018 cscope at [facility] (but had no issues during the 2018 cscope"  No anesthesia complications noted with most recent procedures.   Pt s/p AICD placement 07/27/21, device orders requested. Per note from EP, "scheduled 09/22/21 for 6 week clearance".  Discussed with Dr. Rich Fuchs office, will reschedule case to get clearance from EP.  VS: BP 133/81   Pulse 84   Temp 36.9 C (Oral)   Resp 16   Ht 6' (1.829 m)   Wt 129.3 kg   SpO2 97%   BMI 38.65 kg/m   PROVIDERS: Josetta Huddle, MD is PCP   Dimple Nanas, MD is Cardiologist  LABS: Labs reviewed: Acceptable for surgery. (all labs ordered are listed, but only abnormal results are displayed)  Labs Reviewed  GLUCOSE, CAPILLARY - Abnormal; Notable for the following components:      Result Value   Glucose-Capillary 155 (*)    All other components within normal limits  HEMOGLOBIN A1C - Abnormal; Notable for the following components:   Hgb A1c MFr Bld 6.5 (*)    All other components within normal limits  BASIC METABOLIC PANEL - Abnormal; Notable for the following components:   Glucose, Bld 136 (*)    BUN 28 (*)    All other components within normal limits  SURGICAL  PCR SCREEN  CBC     IMAGES:   EKG: 03/05/2021 Rate 86 bpm  Atrial fibrillation  RBBB  CV: Echo 03/25/2021 Left Ventricle: Left ventricle was not well visualized. Left ventricle  is mildly dilated.    Left Ventricle: Severe global hypokinesis.    Left Ventricle: There is moderate concentric hypertrophy.    Left Ventricle: Systolic function is severely abnormal. EF: 25-30%.    Left Atrium: Left atrium is moderately dilated.    Aortic Valve: Trace aortic valve regurgitation.    Tricuspid Valve: There is mild regurgitation.    Tricuspid Valve: The right ventricular systolic pressure is normal (<36  mmHg).  Stress Test 06/13/2019 IMPRESSION: Essentially normal cardiac perfusion exam. Prognostically this is a low risk scan  Past Medical History:  Diagnosis Date   AICD (automatic cardioverter/defibrillator) present    Arthritis    "hands primarily" (05/05/2016)   Atrial fibrillation (HCC)    Back pain    age related   CHF (congestive heart failure) ([place])    no problemes since 4196   Complication of anesthesia    Bronchial spasms   COPD (chronic obstructive pulmonary disease) (HCC)    inhalers as needed   Coronary artery disease    Depression    takes Zoloft daily   Diabetic peripheral neuropathy ([place]) 01/03/2019   Dysrhythmia    A Fib-takes Xarelto daily   Enlarged prostate  benign   GERD (gastroesophageal reflux disease)    takes Omeprazole daily   Gout    History of blood clots    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)   Hyperlipidemia    takes Atorvastatin daily   Hypertension    takes Metoprolol,HCTZ,and Losartan daily.Takes Clonidine if needed   Hyperuricemia 07/14/2021   Nocturia    Peripheral neuropathy    takes Gabapentin daily   Peripheral vascular disease (HCC)    legs blockage    Pneumonia ?2015   Presence of permanent cardiac pacemaker    Restless leg    Sleep apnea    uses Bipap   Stroke [facility]) 2012   denies residual on 05/05/2016     Past Surgical History:  Procedure Laterality Date   AMPUTATION Left 04/03/2021   Procedure: LEFT 2ND RAY AMPUTATION;  Surgeon: Newt Minion, MD;  Location: [place];  Service: Orthopedics;  Laterality: Left;   CARPAL TUNNEL RELEASE     CATARACT EXTRACTION W/ INTRAOCULAR LENS  IMPLANT, BILATERAL Bilateral    COLONOSCOPY WITH PROPOFOL N/A 03/09/2017   Procedure: COLONOSCOPY WITH PROPOFOL;  Surgeon: Wonda Horner, MD;  Location: [facility] ENDOSCOPY;  Service: Endoscopy;  Laterality: N/A;   COLONOSCOPY WITH PROPOFOL N/A 04/03/2020   Procedure: COLONOSCOPY WITH PROPOFOL;  Surgeon: Wonda Horner, MD;  [facility];  Service: Endoscopy;  Laterality: N/A;   I & D KNEE WITH POLY EXCHANGE Right 05/04/2016   IRRIGATION AND DEBRIDEMENT Right KNEE WITH UNICOMPARTMENTAL POLY EXCHANGE   I & D KNEE WITH POLY EXCHANGE Right 05/04/2016   Procedure: IRRIGATION AND DEBRIDEMENT Right KNEE WITH UNICOMPARTMENTAL POLY EXCHANGE;  Surgeon: Renette Butters, MD;  Location: [place];  Service: Orthopedics;  Laterality: Right;   JOINT REPLACEMENT     LAPAROSCOPIC CHOLECYSTECTOMY  1995   PARTIAL KNEE ARTHROPLASTY Right 04/06/2016   Procedure: UNICOMPARTMENTAL RIGHT KNEE;  Surgeon: Renette Butters, MD;  Location: [place];  Service: Orthopedics;  Laterality: Right;   POLYPECTOMY  04/03/2020   Procedure: POLYPECTOMY;  Surgeon: Wonda Horner, MD;  [facility];  Service: Endoscopy;;   REVERSE SHOULDER ARTHROPLASTY Left 04/29/2021   Procedure: Revision antibiotic spacer placement. Incision and drainage.;  Surgeon: [name], MD;  [facility];  Service: Orthopedics;  Laterality: Left;   STRABISMUS SURGERY Bilateral 06/10/2017   Procedure: REPAIR STRABISMUS BILATERAL;  Surgeon: Everitt Amber, MD;  Location: [place];  Service: Ophthalmology;  Laterality: Bilateral;   TONSILLECTOMY AND ADENOIDECTOMY     TOTAL KNEE ARTHROPLASTY Left 2008   TOTAL SHOULDER ARTHROPLASTY Left 03/04/2021    Procedure: Total Shoulder Arthroplasty;  Surgeon: [name], MD;  [facility];  Service: Orthopedics;  Laterality: Left;   ULNAR NERVE TRANSPOSITION Bilateral 1989    MEDICATIONS:  ACCU-CHEK GUIDE test strip   Accu-Chek Softclix Lancets lancets   acetaminophen (TYLENOL) 500 MG tablet   allopurinol (ZYLOPRIM) 100 MG tablet   atorvastatin (LIPITOR) 10 MG tablet   carboxymethylcellulose (REFRESH PLUS) 0.5 % SOLN   Cholecalciferol (VITAMIN D) 50 MCG (2000 UT) tablet   cloNIDine (CATAPRES) 0.1 MG tablet   colchicine 0.6 MG tablet   cyclobenzaprine (FLEXERIL) 10 MG tablet   diclofenac Sodium (VOLTAREN) 1 % GEL   ENTRESTO 24-26 MG   gabapentin (NEURONTIN) 300 MG capsule   metolazone (ZAROXOLYN) 2.5 MG tablet   metoprolol succinate (TOPROL-XL) 50 MG 24 hr tablet   minocycline (MINOCIN) 100 MG capsule   nitroGLYCERIN (NITRODUR -  DOSED IN MG/24 HR) 0.2 mg/hr patch   omeprazole (PRILOSEC) 20 MG capsule   oxyCODONE (OXY IR/ROXICODONE) 5 MG immediate release tablet   oxyCODONE-acetaminophen (PERCOCET/ROXICET) 5-325 MG tablet   potassium chloride (MICRO-K) 10 MEQ CR capsule   rivaroxaban (XARELTO) 20 MG TABS tablet   Semaglutide, 1 MG/DOSE, (OZEMPIC, 1 MG/DOSE,) 2 MG/1.5ML SOPN   sertraline (ZOLOFT) 100 MG tablet   spironolactone (ALDACTONE) 25 MG tablet   torsemide (DEMADEX) 100 MG tablet   No current facility-administered medications for this encounter.     Willie Felix Ward, PA-C [facility] [phone]

## 2021-09-02 ENCOUNTER — Ambulatory Visit (HOSPITAL_COMMUNITY): Admission: RE | Admit: 2021-09-02 | Payer: Medicare Other | Source: Ambulatory Visit | Admitting: Orthopaedic Surgery

## 2021-09-02 ENCOUNTER — Encounter (HOSPITAL_COMMUNITY): Admission: RE | Payer: Self-pay | Source: Ambulatory Visit

## 2021-09-02 SURGERY — ARTHROPLASTY, SHOULDER, TOTAL
Anesthesia: Choice | Site: Shoulder | Laterality: Left

## 2021-09-03 DIAGNOSIS — M19012 Primary osteoarthritis, left shoulder: Secondary | ICD-10-CM | POA: Diagnosis not present

## 2021-09-03 DIAGNOSIS — M25512 Pain in left shoulder: Secondary | ICD-10-CM | POA: Diagnosis not present

## 2021-09-04 ENCOUNTER — Ambulatory Visit: Payer: Medicare Other | Admitting: Infectious Disease

## 2021-09-07 DIAGNOSIS — I429 Cardiomyopathy, unspecified: Secondary | ICD-10-CM | POA: Diagnosis not present

## 2021-09-10 ENCOUNTER — Telehealth: Payer: Self-pay | Admitting: Orthopedic Surgery

## 2021-09-10 NOTE — Telephone Encounter (Signed)
Left second toe got amputated in 2022..now pt is "experiencing twice the amount of edema". He is having pain in his calf as well.   CB [phone]

## 2021-09-10 NOTE — Telephone Encounter (Signed)
Can you please call pt and make an appt for tomorrow or with Junie Panning or sometime next week. He is s/p a ray amputation 03/2021 with increased swelling in limb

## 2021-09-11 ENCOUNTER — Encounter: Payer: Self-pay | Admitting: Family

## 2021-09-11 ENCOUNTER — Ambulatory Visit: Payer: Medicare Other | Admitting: Family

## 2021-09-11 ENCOUNTER — Ambulatory Visit: Payer: Self-pay

## 2021-09-11 DIAGNOSIS — M79672 Pain in left foot: Secondary | ICD-10-CM

## 2021-09-11 DIAGNOSIS — M1A0720 Idiopathic chronic gout, left ankle and foot, without tophus (tophi): Secondary | ICD-10-CM

## 2021-09-11 DIAGNOSIS — M79662 Pain in left lower leg: Secondary | ICD-10-CM

## 2021-09-11 MED ORDER — ALLOPURINOL 100 MG PO TABS: 200.0000 mg | ORAL_TABLET | Freq: Two times a day (BID) | ORAL | 12 refills | Status: DC

## 2021-09-11 NOTE — Progress Notes (Signed)
Office Visit Note   Patient: Willie Pineda           Date of Birth: Jun 27, 1946           MRN: [id number] Visit Date: 09/11/2021              Requested by: Josetta Huddle, MD [address] [place],  [place] [address] PCP: Josetta Huddle, MD  Chief Complaint  Patient presents with   Left Foot - Pain    Hx left 2nd ray amputation       HPI: The patient is a 75 year old gentleman who presents today complaining of left foot pain as well as calf pain.  He is having pain with redness and some mild erythema along the medial column he has not had any associated injury.  He is also having some pain in his calf.  This is been ongoing for about 4 days.  The swelling tenderness to his calf a little bit to the popliteal fossa.  He has been wearing a compression stocking he has been full weightbearing in regular shoewear.  Complaining of restlessness feeling in his leg especially at night.  No pain swelling fever chills no recall of infection injury swelling of the foot significantly  Assessment & Plan: Visit Diagnoses: No diagnosis found.  Plan: Gout flare. Radiographs reassuring for no osteomyelitis or Charcot collapse of his foot.  We will have him begin his colchicine hopefully this will assist with the pain he is having.   Ortho Exam  Patient is alert, oriented, no adenopathy, well-dressed, normal affect, normal respiratory effort. On examination of the left lower extremity his calf is point tender in the belly of the gastrocnemius.  There is no erythema no dimpling of the skin.  There is no warmth.  To the foot he does have some erythema and edema over the midfoot especially over the base of the first metatarsal.  This is diffusely tender no pain or tenderness to the great toe or with range of motion of the great toe there are no wounds no ulcers no open areas  Imaging: No results found. No images are attached to the encounter.  Labs: Lab Results  Component Value Date    HGBA1C 6.5 (H) 08/20/2021   HGBA1C 6.5 (H) 04/27/2021   HGBA1C 7.0 (H) 05/04/2016   ESRSEDRATE 25 08/13/2021   ESRSEDRATE 32 (H) 04/30/2021   ESRSEDRATE 51 (H) 04/15/2021   CRP 39 (H) 08/13/2021   CRP 1.1 (H) 04/30/2021   CRP 42.3 (H) 04/15/2021   LABURIC 8.5 (H) 07/03/2021   LABURIC 9.5 (H) 04/01/2021   REPTSTATUS 05/04/2021 FINAL 04/29/2021   GRAMSTAIN  04/29/2021    RARE WBC PRESENT, PREDOMINANTLY MONONUCLEAR NO ORGANISMS SEEN    CULT  04/29/2021    No growth aerobically or anaerobically. Performed at [facility], [place] [address], [place], [place] [address]      Lab Results  Component Value Date   ALBUMIN 3.7 03/25/2016    No results found for: MG No results found for: VD25OH  No results found for: PREALBUMIN CBC EXTENDED Latest Ref Rng & Units 08/20/2021 08/13/2021 04/30/2021  WBC 4.0 - 10.5 K/uL 10.4 8.0 14.4(H)  RBC 4.22 - 5.81 MIL/uL 4.92 4.84 4.08(L)  HGB 13.0 - 17.0 g/dL 16.2 16.0 13.3  HCT 39.0 - 52.0 % 48.7 45.0 40.5  PLT 150 - 400 K/uL 196 184 260  NEUTROABS 1.4 - 7.0 x10E3/uL - 4.8 -  LYMPHSABS 0.7 - 3.1  x10E3/uL - 2.1 -     There is no height or weight on file to calculate BMI.  Orders:  No orders of the defined types were placed in this encounter.  No orders of the defined types were placed in this encounter.    Procedures: No procedures performed  Clinical Data: No additional findings.  ROS:  All other systems negative, except as noted in the HPI. Review of Systems  Cardiovascular:  Positive for leg swelling.  Musculoskeletal:  Positive for arthralgias and joint swelling.  Skin:  Positive for color change. Negative for wound.   Objective: Vital Signs: There were no vitals taken for this visit.  Specialty Comments:  No specialty comments available.  PMFS History: Patient Active Problem List   Diagnosis Date Noted   Hyperuricemia 07/14/2021   Septic joint of left shoulder region ([place]) 04/29/2021   Osteomyelitis of second toe  of left foot (HCC)    Penicillin allergy    Chronic osteomyelitis ([place])    Diabetic foot infection ([place])    S/p total knee replacement, bilateral    Septic arthritis of shoulder, left ([place]) 03/03/2021   Atrial fibrillation, chronic ([place]) 03/03/2021   Chronic anticoagulation 03/03/2021   DM (diabetes mellitus), type 2 with neurological complications ([place]) 71/24/5809   Essential hypertension 03/03/2021   Hypokalemia 03/03/2021   Prolonged QT interval 03/03/2021   COPD (chronic obstructive pulmonary disease) ([place]) 03/03/2021   Chronic CHF (congestive heart failure) ([place]) 03/03/2021   Diabetic peripheral neuropathy ([place]) 01/03/2019   Obesity 06/01/2016   Wound dehiscence 05/04/2016   Knee osteoarthritis 04/06/2016   Past Medical History:  Diagnosis Date   AICD (automatic cardioverter/defibrillator) present    Arthritis    "hands primarily" (05/05/2016)   Atrial fibrillation (HCC)    Back pain    age related   CHF (congestive heart failure) ([place])    no problemes since 9833   Complication of anesthesia    Bronchial spasms   COPD (chronic obstructive pulmonary disease) (HCC)    inhalers as needed   Coronary artery disease    Depression    takes Zoloft daily   Diabetic peripheral neuropathy ([place]) 01/03/2019   Dysrhythmia    A Fib-takes Xarelto daily   Enlarged prostate    benign   GERD (gastroesophageal reflux disease)    takes Omeprazole daily   Gout    History of blood clots    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)   Hyperlipidemia    takes Atorvastatin daily   Hypertension    takes Metoprolol,HCTZ,and Losartan daily.Takes Clonidine if needed   Hyperuricemia 07/14/2021   Nocturia    Peripheral neuropathy    takes Gabapentin daily   Peripheral vascular disease (HCC)    legs blockage    Pneumonia ?2015   Presence of permanent cardiac pacemaker    Restless leg    Sleep apnea    uses Bipap   Stroke [facility]) 2012   denies residual on 05/05/2016    Family History   Problem Relation Age of Onset   CVA Mother    Liver cancer Father     Past Surgical History:  Procedure Laterality Date   AMPUTATION Left 04/03/2021   Procedure: LEFT 2ND RAY AMPUTATION;  Surgeon: Newt Minion, MD;  Location: [facility];  Service: Orthopedics;  Laterality: Left;   CARPAL TUNNEL RELEASE     CATARACT EXTRACTION W/ INTRAOCULAR LENS  IMPLANT, BILATERAL Bilateral    COLONOSCOPY WITH PROPOFOL N/A 03/09/2017  Procedure: COLONOSCOPY WITH PROPOFOL;  Surgeon: Wonda Horner, MD;  Location[facility] ENDOSCOPY;  Service: Endoscopy;  Laterality: N/A;   COLONOSCOPY WITH PROPOFOL N/A 04/03/2020   Procedure: COLONOSCOPY WITH PROPOFOL;  Surgeon: Wonda Horner, MD;  Location: [facility] ENDOSCOPY;  Service: Endoscopy;  Laterality: N/A;   I & D KNEE WITH POLY EXCHANGE Right 05/04/2016   IRRIGATION AND DEBRIDEMENT Right KNEE WITH UNICOMPARTMENTAL POLY EXCHANGE   I & D KNEE WITH POLY EXCHANGE Right 05/04/2016   Procedure: IRRIGATION AND DEBRIDEMENT Right KNEE WITH UNICOMPARTMENTAL POLY EXCHANGE;  Surgeon: Renette Butters, MD;  Location: [facility];  Service: Orthopedics;  Laterality: Right;   JOINT REPLACEMENT     LAPAROSCOPIC CHOLECYSTECTOMY  1995   PARTIAL KNEE ARTHROPLASTY Right 04/06/2016   Procedure: UNICOMPARTMENTAL RIGHT KNEE;  Surgeon: Renette Butters, MD;  Location: [facility];  Service: Orthopedics;  Laterality: Right;   POLYPECTOMY  04/03/2020   Procedure: POLYPECTOMY;  Surgeon: Wonda Horner, MD;  Location: [facility] ENDOSCOPY;  Service: Endoscopy;;   REVERSE SHOULDER ARTHROPLASTY Left 04/29/2021   Procedure: Revision antibiotic spacer placement. Incision and drainage.;  Surgeon: [name], MD;  Location: [facility];  Service: Orthopedics;  Laterality: Left;   STRABISMUS SURGERY Bilateral 06/10/2017   Procedure: REPAIR STRABISMUS BILATERAL;  Surgeon: Everitt Amber, MD;  Location: [facility];  Service: Ophthalmology;  Laterality: Bilateral;   TONSILLECTOMY AND ADENOIDECTOMY     TOTAL KNEE  ARTHROPLASTY Left 2008   TOTAL SHOULDER ARTHROPLASTY Left 03/04/2021   Procedure: Total Shoulder Arthroplasty;  Surgeon: [name], MD;  Location: [facility];  Service: Orthopedics;  Laterality: Left;   ULNAR NERVE TRANSPOSITION Bilateral 1989   Social History   Occupational History   Occupation: Retired Therapist, sports  Tobacco Use   Smoking status: Former    Packs/day: 0.50    Years: 40.00    Pack years: 20.00    Types: Cigarettes    Quit date: 07/15/2011    Years since quitting: 10.1   Smokeless tobacco: Former    Types: Nurse, children's Use: Never used  Substance and Sexual Activity   Alcohol use: Yes    Comment: 2 beers a month   Drug use: No   Sexual activity: Yes

## 2021-09-12 LAB — URIC ACID: Uric Acid, Serum: 6.6 mg/dL (ref 4.0–8.0)

## 2021-09-16 ENCOUNTER — Telehealth: Payer: Self-pay | Admitting: Family

## 2021-09-16 NOTE — Telephone Encounter (Signed)
Pt states he is returning call from [place]. Please call pt at [phone].

## 2021-09-16 NOTE — Telephone Encounter (Signed)
Uric acid

## 2021-09-16 NOTE — Telephone Encounter (Signed)
Pt called and states he had received a call from someone. He would like a call back.   CB [phone]

## 2021-09-17 ENCOUNTER — Telehealth: Payer: Self-pay | Admitting: *Deleted

## 2021-09-17 ENCOUNTER — Encounter (HOSPITAL_COMMUNITY): Payer: Medicare Other

## 2021-09-17 NOTE — Telephone Encounter (Signed)
I called pt to let him know of his appt for Ultrasound DVT pt states he is better, no redness, no heat, no tenderness etc. Feels like there is no use in having this done. [facility] was [place] [facility] at [facility].   Pt also would like for Erin to return his call when she is in office.

## 2021-09-18 NOTE — Progress Notes (Signed)
Sent message, via epic in basket, requesting orders in epic from surgeon.  

## 2021-09-21 NOTE — Patient Instructions (Addendum)
DUE TO COVID-19 ONLY ONE VISITOR IS ALLOWED TO COME WITH YOU AND STAY IN THE WAITING ROOM ONLY DURING PRE OP AND PROCEDURE.   **NO VISITORS ARE ALLOWED IN THE SHORT STAY AREA OR RECOVERY ROOM!!**       Your procedure is scheduled on: 09/30/21   Report to [facility] Main Entrance    Report to admitting at 9:10 AM   Call this number if you have problems the morning of surgery [phone]   Do not eat food :After Midnight.   May have liquids until 8:55 AM day of surgery  CLEAR LIQUID DIET  Foods Allowed                                                                     Foods Excluded  Water, Black Coffee and tea (no milk or creamer)            liquids that you cannot  Plain Jell-O in any flavor  (No red)                                     see through such as: Fruit ices (not with fruit pulp)                                             milk, soups, orange juice              Iced Popsicles (No red)                                                 All solid food                                   Apple juices Sports drinks like Gatorade (No red) Lightly seasoned clear broth or consume(fat free) Sugar       The day of surgery:  Drink ONE (1) Pre-Surgery G2 by 8:55 am the morning of surgery. Drink in one sitting. Do not sip.  This drink was given to you during your hospital  pre-op appointment visit. Nothing else to drink after completing the  Pre-Surgery G2.          If you have questions, please contact your surgeon's office.     Oral Hygiene is also important to reduce your risk of infection.                                    Remember - BRUSH YOUR TEETH THE MORNING OF SURGERY WITH YOUR REGULAR TOOTHPASTE   Do NOT smoke after Midnight   Take these medicines the morning of surgery with A SIP OF WATER: Tylenol, Allopurinol, Lipitor, Clonidine, Gabapentin, Metoprolol, Omeprazole, Percocet, Zoloft.  DO NOT TAKE ANY ORAL DIABETIC MEDICATIONS DAY OF YOUR  SURGERY  How to Manage Your Diabetes Before and After Surgery  Why is it important to control my blood sugar before and after surgery? Improving blood sugar levels before and after surgery helps healing and can limit problems. A way of improving blood sugar control is eating a healthy diet by:  Eating less sugar and carbohydrates  Increasing activity/exercise  Talking with your doctor about reaching your blood sugar goals High blood sugars (greater than 180 mg/dL) can raise your risk of infections and slow your recovery, so you will need to focus on controlling your diabetes during the weeks before surgery. Make sure that the doctor who takes care of your diabetes knows about your planned surgery including the date and location.  How do I manage my blood sugar before surgery? Check your blood sugar at least 4 times a day, starting 2 days before surgery, to make sure that the level is not too high or low. Check your blood sugar the morning of your surgery when you wake up and every 2 hours until you get to the Short Stay unit. If your blood sugar is less than 70 mg/dL, you will need to treat for low blood sugar: Do not take insulin. Treat a low blood sugar (less than 70 mg/dL) with  cup of clear juice (cranberry or apple), 4 glucose tablets, OR glucose gel. Recheck blood sugar in 15 minutes after treatment (to make sure it is greater than 70 mg/dL). If your blood sugar is not greater than 70 mg/dL on recheck, call [phone] for further instructions. Report your blood sugar to the short stay nurse when you get to Short Stay.  If you are admitted to the hospital after surgery: Your blood sugar will be checked by the staff and you will probably be given insulin after surgery (instead of oral diabetes medicines) to make sure you have good blood sugar levels. The goal for blood sugar control after surgery is 80-180 mg/dL.  Reviewed and Endorsed by [facility] Patient Education Committee,  August 2015                               You may not have any metal on your body including jewelry, and body piercing             Do not wear lotions, powders, cologne, or deodorant              Men may shave face and neck.   Do not bring valuables to the hospital. [place].    Patients discharged on the day of surgery will not be allowed to drive home.  Please read over the following fact sheets you were given: IF YOU HAVE QUESTIONS ABOUT YOUR PRE-OP INSTRUCTIONS PLEASE CALL [place] - Preparing for Surgery Before surgery, you can play an important role.  Because skin is not sterile, your skin needs to be as free of germs as possible.  You can reduce the number of germs on your skin by washing with CHG (chlorahexidine gluconate) soap before surgery.  CHG is an antiseptic cleaner which kills germs and bonds with the skin to continue killing germs even after washing. Please DO NOT use if you have an allergy to CHG or antibacterial soaps.  If your skin becomes reddened/irritated stop using the CHG and  inform your nurse when you arrive at Short Stay. Do not shave (including legs and underarms) for at least 48 hours prior to the first CHG shower.  You may shave your face/neck.  Please follow these instructions carefully:  1.  Shower with CHG Soap the night before surgery and the  morning of surgery.  2.  If you choose to wash your hair, wash your hair first as usual with your normal  shampoo.  3.  After you shampoo, rinse your hair and body thoroughly to remove the shampoo.                             4.  Use CHG as you would any other liquid soap.  You can apply chg directly to the skin and wash.  Gently with a scrungie or clean washcloth.  5.  Apply the CHG Soap to your body ONLY FROM THE NECK DOWN.   Do   not use on face/ open                           Wound or open sores. Avoid contact with eyes, ears mouth and    genitals (private parts).                       Wash face,  Genitals (private parts) with your normal soap.             6.  Wash thoroughly, paying special attention to the area where your    surgery  will be performed.  7.  Thoroughly rinse your body with warm water from the neck down.  8.  DO NOT shower/wash with your normal soap after using and rinsing off the CHG Soap.                9.  Pat yourself dry with a clean towel.            10.  Wear clean pajamas.            11.  Place clean sheets on your bed the night of your first shower and do not  sleep with pets. Day of Surgery : Do not apply any lotions/deodorants the morning of surgery.  Please wear clean clothes to the hospital/surgery center.  FAILURE TO FOLLOW THESE INSTRUCTIONS MAY RESULT IN THE CANCELLATION OF YOUR SURGERY  PATIENT SIGNATURE_________________________________  NURSE SIGNATURE__________________________________  ________________________________________________________________________   Adam Phenix  An incentive spirometer is a tool that can help keep your lungs clear and active. This tool measures how well you are filling your lungs with each breath. Taking long deep breaths may help reverse or decrease the chance of developing breathing (pulmonary) problems (especially infection) following: A long period of time when you are unable to move or be active. BEFORE THE PROCEDURE  If the spirometer includes an indicator to show your best effort, your nurse or respiratory therapist will set it to a desired goal. If possible, sit up straight or lean slightly forward. Try not to slouch. Hold the incentive spirometer in an upright position. INSTRUCTIONS FOR USE  Sit on the edge of your bed if possible, or sit up as far as you can in bed or on a chair. Hold the incentive spirometer in an upright position. Breathe out normally. Place the mouthpiece in your mouth and seal your lips tightly around it. Breathe in  slowly  and as deeply as possible, raising the piston or the ball toward the top of the column. Hold your breath for 3-5 seconds or for as long as possible. Allow the piston or ball to fall to the bottom of the column. Remove the mouthpiece from your mouth and breathe out normally. Rest for a few seconds and repeat Steps 1 through 7 at least 10 times every 1-2 hours when you are awake. Take your time and take a few normal breaths between deep breaths. The spirometer may include an indicator to show your best effort. Use the indicator as a goal to work toward during each repetition. After each set of 10 deep breaths, practice coughing to be sure your lungs are clear. If you have an incision (the cut made at the time of surgery), support your incision when coughing by placing a pillow or rolled up towels firmly against it. Once you are able to get out of bed, walk around indoors and cough well. You may stop using the incentive spirometer when instructed by your caregiver.  RISKS AND COMPLICATIONS Take your time so you do not get dizzy or light-headed. If you are in pain, you may need to take or ask for pain medication before doing incentive spirometry. It is harder to take a deep breath if you are having pain. AFTER USE Rest and breathe slowly and easily. It can be helpful to keep track of a log of your progress. Your caregiver can provide you with a simple table to help with this. If you are using the spirometer at home, follow these instructions: [place] IF:  You are having difficultly using the spirometer. You have trouble using the spirometer as often as instructed. Your pain medication is not giving enough relief while using the spirometer. You develop fever of 100.5 F (38.1 C) or higher. SEEK IMMEDIATE MEDICAL CARE IF:  You cough up bloody sputum that had not been present before. You develop fever of 102 F (38.9 C) or greater. You develop worsening pain at or near the incision  site. MAKE SURE YOU:  Understand these instructions. Will watch your condition. Will get help right away if you are not doing well or get worse. Document Released: 04/04/2007 Document Revised: 02/14/2012 Document Reviewed: 06/05/2007 ExitCare Patient Information 2014 ExitCare, [place].   ________________________________________________________________________  WHAT IS A BLOOD TRANSFUSION? Blood Transfusion Information  A transfusion is the replacement of blood or some of its parts. Blood is made up of multiple cells which provide different functions. Red blood cells carry oxygen and are used for blood loss replacement. White blood cells fight against infection. Platelets control bleeding. Plasma helps clot blood. Other blood products are available for specialized needs, such as hemophilia or other clotting disorders. BEFORE THE TRANSFUSION  Who gives blood for transfusions?  Healthy volunteers who are fully evaluated to make sure their blood is safe. This is blood bank blood. Transfusion therapy is the safest it has ever been in the practice of medicine. Before blood is taken from a donor, a complete history is taken to make sure that person has no history of diseases nor engages in risky social behavior (examples are intravenous drug use or sexual activity with multiple partners). The donor's travel history is screened to minimize risk of transmitting infections, such as malaria. The donated blood is tested for signs of infectious diseases, such as HIV and hepatitis. The blood is then tested to be sure it is compatible with you in order to minimize the  chance of a transfusion reaction. If you or a relative donates blood, this is often done in anticipation of surgery and is not appropriate for emergency situations. It takes many days to process the donated blood. RISKS AND COMPLICATIONS Although transfusion therapy is very safe and saves many lives, the main dangers of transfusion include:   Getting an infectious disease. Developing a transfusion reaction. This is an allergic reaction to something in the blood you were given. Every precaution is taken to prevent this. The decision to have a blood transfusion has been considered carefully by your caregiver before blood is given. Blood is not given unless the benefits outweigh the risks. AFTER THE TRANSFUSION Right after receiving a blood transfusion, you will usually feel much better and more energetic. This is especially true if your red blood cells have gotten low (anemic). The transfusion raises the level of the red blood cells which carry oxygen, and this usually causes an energy increase. The nurse administering the transfusion will monitor you carefully for complications. HOME CARE INSTRUCTIONS  No special instructions are needed after a transfusion. You may find your energy is better. Speak with your caregiver about any limitations on activity for underlying diseases you may have. SEEK MEDICAL CARE IF:  Your condition is not improving after your transfusion. You develop redness or irritation at the intravenous (IV) site. SEEK IMMEDIATE MEDICAL CARE IF:  Any of the following symptoms occur over the next 12 hours: Shaking chills. You have a temperature by mouth above 102 F (38.9 C), not controlled by medicine. Chest, back, or muscle pain. People around you feel you are not acting correctly or are confused. Shortness of breath or difficulty breathing. Dizziness and fainting. You get a rash or develop hives. You have a decrease in urine output. Your urine turns a dark color or changes to pink, red, or brown. Any of the following symptoms occur over the next 10 days: You have a temperature by mouth above 102 F (38.9 C), not controlled by medicine. Shortness of breath. Weakness after normal activity. The white part of the eye turns yellow (jaundice). You have a decrease in the amount of urine or are urinating less  often. Your urine turns a dark color or changes to pink, red, or brown. Document Released: 11/19/2000 Document Revised: 02/14/2012 Document Reviewed: 07/08/2008 ExitCare Patient Information 2014 [place].  _______________________________________________________________________  [facility]- Preparing for Total Shoulder Arthroplasty    Before surgery, you can play an important role. Because skin is not sterile, your skin needs to be as free of germs as possible. You can reduce the number of germs on your skin by using the following products. Benzoyl Peroxide Gel Reduces the number of germs present on the skin Applied twice a day to shoulder area starting two days before surgery    ==================================================================  Please follow these instructions carefully:  BENZOYL PEROXIDE 5% GEL  Please do not use if you have an allergy to benzoyl peroxide.   If your skin becomes reddened/irritated stop using the benzoyl peroxide.  Starting two days before surgery, apply as follows: Apply benzoyl peroxide in the morning and at night. Apply after taking a shower. If you are not taking a shower clean entire shoulder front, back, and side along with the armpit with a clean wet washcloth.  Place a quarter-sized dollop on your shoulder and rub in thoroughly, making sure to cover the front, back, and side of your shoulder, along with the armpit.   2 days before ____  AM   ____ PM              1 day before ____ AM   ____ PM                         Do this twice a day for two days.  (Last application is the night before surgery, AFTER using the CHG soap as described below).  Do NOT apply benzoyl peroxide gel on the day of surgery.

## 2021-09-21 NOTE — Progress Notes (Addendum)
COVID swab appointment: n/a  COVID Vaccine Completed: yes x3 Date COVID Vaccine completed: 01/10/20, 02/04/20 Has received booster: 05/14/21 COVID vaccine manufacturer: Pfizer      Date of COVID positive in last 90 days: no  PCP - Josetta Huddle, MD Cardiologist - Lamar Blinks, MD  Chest x-ray - 04/29/21 Epic EKG - 07/27/21 NH req Stress Test - 06/13/19 Care ECHO - 03/25/21 Care Cardiac Cath -  Pacemaker/ICD device last checked: 09/07/21 Epic Spinal Cord Stimulator: n/a  Sleep Study - yes  CPAP - BiPAP every night  Fasting Blood Sugar - 120-140 Checks Blood Sugar _one a month  Blood Thinner Instructions: Xarelto, hold 2 days Aspirin Instructions: Last Dose: 09/27/21  Activity level: Can go up a flight of stairs and perform activities of daily living without stopping and without symptoms of chest pain or shortness of breath.       Anesthesia review: bronchial spasms  Patient denies shortness of breath, fever, cough and chest pain at PAT appointment   Patient verbalized understanding of instructions that were given to them at the PAT appointment. Patient was also instructed that they will need to review over the PAT instructions again at home before surgery.

## 2021-09-22 ENCOUNTER — Encounter (HOSPITAL_COMMUNITY)
Admission: RE | Admit: 2021-09-22 | Discharge: 2021-09-22 | Disposition: A | Discharge status: Home / Self Care | Payer: Medicare Other | Source: Ambulatory Visit | Attending: Orthopaedic Surgery | Admitting: Orthopaedic Surgery

## 2021-09-22 ENCOUNTER — Encounter (HOSPITAL_COMMUNITY): Payer: Self-pay

## 2021-09-22 DIAGNOSIS — Z7901 Long term (current) use of anticoagulants: Secondary | ICD-10-CM | POA: Diagnosis not present

## 2021-09-22 DIAGNOSIS — J449 Chronic obstructive pulmonary disease, unspecified: Secondary | ICD-10-CM | POA: Diagnosis not present

## 2021-09-22 DIAGNOSIS — Z7985 Long-term (current) use of injectable non-insulin antidiabetic drugs: Secondary | ICD-10-CM | POA: Diagnosis not present

## 2021-09-22 DIAGNOSIS — I2510 Atherosclerotic heart disease of native coronary artery without angina pectoris: Secondary | ICD-10-CM | POA: Diagnosis not present

## 2021-09-22 DIAGNOSIS — I428 Other cardiomyopathies: Secondary | ICD-10-CM | POA: Diagnosis not present

## 2021-09-22 DIAGNOSIS — M19012 Primary osteoarthritis, left shoulder: Secondary | ICD-10-CM | POA: Insufficient documentation

## 2021-09-22 DIAGNOSIS — I4891 Unspecified atrial fibrillation: Secondary | ICD-10-CM | POA: Diagnosis not present

## 2021-09-22 DIAGNOSIS — Z87891 Personal history of nicotine dependence: Secondary | ICD-10-CM | POA: Insufficient documentation

## 2021-09-22 DIAGNOSIS — Z79899 Other long term (current) drug therapy: Secondary | ICD-10-CM | POA: Insufficient documentation

## 2021-09-22 DIAGNOSIS — I10 Essential (primary) hypertension: Secondary | ICD-10-CM | POA: Diagnosis not present

## 2021-09-22 DIAGNOSIS — Z01812 Encounter for preprocedural laboratory examination: Secondary | ICD-10-CM | POA: Insufficient documentation

## 2021-09-22 DIAGNOSIS — Z95810 Presence of automatic (implantable) cardiac defibrillator: Secondary | ICD-10-CM | POA: Insufficient documentation

## 2021-09-22 DIAGNOSIS — K219 Gastro-esophageal reflux disease without esophagitis: Secondary | ICD-10-CM | POA: Insufficient documentation

## 2021-09-22 DIAGNOSIS — Z79891 Long term (current) use of opiate analgesic: Secondary | ICD-10-CM | POA: Diagnosis not present

## 2021-09-22 LAB — GLUCOSE, CAPILLARY: Glucose-Capillary: 139 mg/dL — ABNORMAL HIGH (ref 70–99)

## 2021-09-22 LAB — SURGICAL PCR SCREEN
MRSA, PCR: NEGATIVE
Staphylococcus aureus: NEGATIVE

## 2021-09-22 LAB — CBC
HCT: 46.8 % (ref 39.0–52.0)
Hemoglobin: 15.8 g/dL (ref 13.0–17.0)
MCH: 33.4 pg (ref 26.0–34.0)
MCHC: 33.8 g/dL (ref 30.0–36.0)
MCV: 98.9 fL (ref 80.0–100.0)
Platelets: 229 10*3/uL (ref 150–400)
RBC: 4.73 MIL/uL (ref 4.22–5.81)
RDW: 14.2 % (ref 11.5–15.5)
WBC: 9.5 10*3/uL (ref 4.0–10.5)
nRBC: 0 % (ref 0.0–0.2)

## 2021-09-22 LAB — BASIC METABOLIC PANEL
Anion gap: 14 (ref 5–15)
BUN: 27 mg/dL — ABNORMAL HIGH (ref 8–23)
CO2: 22 mmol/L (ref 22–32)
Calcium: 9.2 mg/dL (ref 8.9–10.3)
Chloride: 101 mmol/L (ref 98–111)
Creatinine, Ser: 1.04 mg/dL (ref 0.61–1.24)
GFR, Estimated: >60 mL/min (ref >60)
Glucose, Bld: 120 mg/dL — ABNORMAL HIGH (ref 70–99)
Potassium: 4.1 mmol/L (ref 3.5–5.1)
Sodium: 137 mmol/L (ref 135–145)

## 2021-09-23 NOTE — Progress Notes (Addendum)
Anesthesia Chart Review   Case: [id number] Date/Time: 09/30/21 0930   Procedure: REVERSE SHOULDER ARTHROPLASTY (Left: Shoulder)   Anesthesia type: Choice   Pre-op diagnosis: LEFT DJD SHOULDER   Location: [facility] ROOM 06 / [facility]   Surgeons: [name], MD       DISCUSSION:75 y.o. former smoker with h/o GERD, HTN, COPD, atrial fibrillation (on Xarelto), sleep apnea on bipap, NICM (EF 25-30% on Echo 03/25/2021, AICD placed 07/27/21), RBBB, left shoulder djd scheduled for above procedure 09/30/2021 with Dr. [name].  S/p left total shoulder arthroplasty 03/04/2021. Per Anesthesia notes, "Had issues with bronchospasm during anesthesia with one of his cscopes in past at a day surgery center- can't remember if it was before or after his 2018 cscope at [facility] (but had no issues during the 2018 cscope"  No anesthesia complications noted with most recent procedures.    AICD placed 07/27/2021.  Last device check 09/11/2021. Device orders on chart.   Anticipate pt can proceed with planned procedure barring acute status change.   VS: BP 99/75   Pulse 96   Temp 37.2 C (Oral)   Resp 14   Ht 6' (1.829 m)   SpO2 97%   BMI 38.65 kg/m   PROVIDERS: Josetta Huddle, MD is PCP   Dimple Nanas, MD is Cardiologist with [facility]  LABS: Labs reviewed: Acceptable for surgery. (all labs ordered are listed, but only abnormal results are displayed)  Labs Reviewed  BASIC METABOLIC PANEL - Abnormal; Notable for the following components:      Result Value   Glucose, Bld 120 (*)    BUN 27 (*)    All other components within normal limits  GLUCOSE, CAPILLARY - Abnormal; Notable for the following components:   Glucose-Capillary 139 (*)    All other components within normal limits  SURGICAL PCR SCREEN  CBC  TYPE AND SCREEN     IMAGES:   EKG: 03/05/2021 Rate 86  Atrial fibrillation  RBBB  CV: Echo 03/25/2021 Left Ventricle: Left ventricle was not well visualized. Left ventricle  is  mildly dilated.    Left Ventricle: Severe global hypokinesis.    Left Ventricle: There is moderate concentric hypertrophy.    Left Ventricle: Systolic function is severely abnormal. EF: 25-30%.    Left Atrium: Left atrium is moderately dilated.    Aortic Valve: Trace aortic valve regurgitation.    Tricuspid Valve: There is mild regurgitation.    Tricuspid Valve: The right ventricular systolic pressure is normal (<36  mmHg).   Stress Test 06/13/2019 IMPRESSION: Essentially normal cardiac perfusion exam. Prognostically this is a low risk scan  Past Medical History:  Diagnosis Date   AICD (automatic cardioverter/defibrillator) present    Arthritis    "hands primarily" (05/05/2016)   Atrial fibrillation (HCC)    Back pain    age related   CHF (congestive heart failure) ([place])    no problemes since 1275   Complication of anesthesia    Bronchial spasms   COPD (chronic obstructive pulmonary disease) (HCC)    inhalers as needed   Coronary artery disease    Depression    takes Zoloft daily   Diabetic peripheral neuropathy ([place]) 01/03/2019   Dysrhythmia    A Fib-takes Xarelto daily   Enlarged prostate    benign   GERD (gastroesophageal reflux disease)    takes Omeprazole daily   Gout    History of blood clots    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)  Hyperlipidemia    takes Atorvastatin daily   Hypertension    takes Metoprolol,HCTZ,and Losartan daily.Takes Clonidine if needed   Hyperuricemia 07/14/2021   Nocturia    Peripheral neuropathy    takes Gabapentin daily   Peripheral vascular disease (HCC)    legs blockage    Pneumonia ?2015   Presence of permanent cardiac pacemaker    Restless leg    Sleep apnea    uses Bipap   Stroke [facility]) 2012   denies residual on 05/05/2016    Past Surgical History:  Procedure Laterality Date   AMPUTATION Left 04/03/2021   Procedure: LEFT 2ND RAY AMPUTATION;  Surgeon: Newt Minion, MD;  Location: [place];  Service: Orthopedics;   Laterality: Left;   CARPAL TUNNEL RELEASE     CATARACT EXTRACTION W/ INTRAOCULAR LENS  IMPLANT, BILATERAL Bilateral    COLONOSCOPY WITH PROPOFOL N/A 03/09/2017   Procedure: COLONOSCOPY WITH PROPOFOL;  Surgeon: Wonda Horner, MD;  Location: [facility] ENDOSCOPY;  Service: Endoscopy;  Laterality: N/A;   COLONOSCOPY WITH PROPOFOL N/A 04/03/2020   Procedure: COLONOSCOPY WITH PROPOFOL;  Surgeon: Wonda Horner, MD;  [facility];  Service: Endoscopy;  Laterality: N/A;   I & D KNEE WITH POLY EXCHANGE Right 05/04/2016   IRRIGATION AND DEBRIDEMENT Right KNEE WITH UNICOMPARTMENTAL POLY EXCHANGE   I & D KNEE WITH POLY EXCHANGE Right 05/04/2016   Procedure: IRRIGATION AND DEBRIDEMENT Right KNEE WITH UNICOMPARTMENTAL POLY EXCHANGE;  Surgeon: Renette Butters, MD;  Location: [place];  Service: Orthopedics;  Laterality: Right;   JOINT REPLACEMENT     LAPAROSCOPIC CHOLECYSTECTOMY  1995   PARTIAL KNEE ARTHROPLASTY Right 04/06/2016   Procedure: UNICOMPARTMENTAL RIGHT KNEE;  Surgeon: Renette Butters, MD;  Location: [place];  Service: Orthopedics;  Laterality: Right;   POLYPECTOMY  04/03/2020   Procedure: POLYPECTOMY;  Surgeon: Wonda Horner, MD;  [facility];  Service: Endoscopy;;   REVERSE SHOULDER ARTHROPLASTY Left 04/29/2021   Procedure: Revision antibiotic spacer placement. Incision and drainage.;  Surgeon: [name], MD;  [facility];  Service: Orthopedics;  Laterality: Left;   STRABISMUS SURGERY Bilateral 06/10/2017   Procedure: REPAIR STRABISMUS BILATERAL;  Surgeon: Everitt Amber, MD;  Location: [place];  Service: Ophthalmology;  Laterality: Bilateral;   TONSILLECTOMY AND ADENOIDECTOMY     TOTAL KNEE ARTHROPLASTY Left 2008   TOTAL SHOULDER ARTHROPLASTY Left 03/04/2021   Procedure: Total Shoulder Arthroplasty;  Surgeon: [name], MD;  [facility];  Service: Orthopedics;  Laterality: Left;   ULNAR NERVE TRANSPOSITION Bilateral 1989    MEDICATIONS:   ACCU-CHEK GUIDE test strip   Accu-Chek Softclix Lancets lancets   acetaminophen (TYLENOL) 500 MG tablet   allopurinol (ZYLOPRIM) 100 MG tablet   atorvastatin (LIPITOR) 20 MG tablet   Cholecalciferol (VITAMIN D) 50 MCG (2000 UT) tablet   cloNIDine (CATAPRES) 0.1 MG tablet   colchicine 0.6 MG tablet   cyclobenzaprine (FLEXERIL) 10 MG tablet   Dextran 70-Hypromellose (ARTIFICIAL TEARS PF OP)   ENTRESTO 24-26 MG   gabapentin (NEURONTIN) 300 MG capsule   metolazone (ZAROXOLYN) 2.5 MG tablet   metoprolol succinate (TOPROL-XL) 50 MG 24 hr tablet   minocycline (MINOCIN) 100 MG capsule   nitroGLYCERIN (NITRODUR - DOSED IN MG/24 HR) 0.2 mg/hr patch   omeprazole (PRILOSEC) 20 MG capsule   oxyCODONE-acetaminophen (PERCOCET/ROXICET) 5-325 MG tablet   potassium chloride (MICRO-K) 10 MEQ CR capsule   rivaroxaban (XARELTO) 20 MG TABS tablet   Semaglutide, 1 MG/DOSE, (OZEMPIC, 1  MG/DOSE,) 2 MG/1.5ML SOPN   sertraline (ZOLOFT) 100 MG tablet   spironolactone (ALDACTONE) 25 MG tablet   torsemide (DEMADEX) 100 MG tablet   No current facility-administered medications for this encounter.      Willie Felix Ward, PA-C [facility]Surgical [facility] [phone]

## 2021-09-29 NOTE — Anesthesia Preprocedure Evaluation (Addendum)
Anesthesia Evaluation  Patient identified by MRN, date of birth, ID band Patient awake    Reviewed: Allergy & Precautions, NPO status , Patient's Chart, lab work & pertinent test results  Airway Mallampati: II  TM Distance: >3 FB Neck ROM: Full    Dental no notable dental hx. (+) Teeth Intact, Dental Advisory Given   Pulmonary sleep apnea (Bipap) , COPD, former smoker,    Pulmonary exam normal breath sounds clear to auscultation       Cardiovascular hypertension, Pt. on home beta blockers and Pt. on medications + CAD, + Cardiac Stents and +CHF  Normal cardiovascular exam+ dysrhythmias (on Xarelto) Atrial Fibrillation + pacemaker + Cardiac Defibrillator  Rhythm:Regular Rate:Normal  Non ischemic cardiomypoathy EF 25-30% Echo 03/25/2021 LeftVentricle: Left ventricle was not well visualized. Left ventricle  is mildly dilated.  Marland Kitchen LeftVentricle: Severe global hypokinesis.  Marland Kitchen LeftVentricle: There is moderate concentric hypertrophy.  . LeftVentricle: Systolic function is severely abnormal. EF: 25-30%.  . LeftAtrium: Left atrium is moderately dilated.  . AorticValve: Trace aortic valve regurgitation.  . TricuspidValve: There is mild regurgitation.  . TricuspidValve: The right ventricular systolic pressure is normal (<36  mmHg).   Neuro/Psych CVA (brainstem), No Residual Symptoms    GI/Hepatic Neg liver ROS, GERD  Medicated and Controlled,  Endo/Other  diabetes  Renal/GU Lab Results      Component                Value               Date                      CREATININE               1.04                09/22/2021                BUN                      27 (H)              09/22/2021                NA                       137                 09/22/2021                K                        4.1                 09/22/2021                CL                       101                 09/22/2021                CO2                       22                  09/22/2021  negative genitourinary   Musculoskeletal  (+) Arthritis ,   Abdominal (+) + obese,   Peds  Hematology Lab Results      Component                Value               Date                      WBC                      9.5                 09/22/2021                HGB                      15.8                09/22/2021                HCT                      46.8                09/22/2021                MCV                      98.9                09/22/2021                PLT                      229                 09/22/2021              Anesthesia Other Findings All: PCN Cefazolin  Reproductive/Obstetrics                           Anesthesia Physical Anesthesia Plan  ASA: 4  Anesthesia Plan: General   Post-op Pain Management:  Regional for Post-op pain   Induction: Intravenous  PONV Risk Score and Plan: 3 and Treatment may vary due to age or medical condition and Ondansetron  Airway Management Planned: Oral ETT  Additional Equipment:   Intra-op Plan:   Post-operative Plan: Extubation in OR  Informed Consent: I have reviewed the patients History and Physical, chart, labs and discussed the procedure including the risks, benefits and alternatives for the proposed anesthesia with the patient or authorized representative who has indicated his/her understanding and acceptance.     Dental advisory given  Plan Discussed with: CRNA  Anesthesia Plan Comments: (See PAT note 09/22/2021, Konrad Felix Ward, PA-C  GA w L ISB w exparel)    Anesthesia Quick Evaluation

## 2021-09-29 NOTE — H&P (Signed)
PREOPERATIVE H&P  Chief Complaint: LEFT DJD SHOULDER  HPI: Willie Pineda is a 75 y.o. male who presents for preoperative history and physical prior to scheduled surgery, Procedure(s): REVERSE SHOULDER ARTHROPLASTY.   Willie Pineda is a 75 y.o. male with  medical history significant for atrial fibrillation, HTN, DMT2-diet controlled, OA, CHF, COPD, OSA. He has had chronic left shoulder pain for many years. Pain worsened in December after a fall where he landed on his left shoulder. He initially thought the pain was getting better with time. He had injections that helped some, but over the past 2-3 weeks his pain was very severe. He has limited range of motion. He saw Dr. Griffin Basil on 02/27/2021 in the office. CT scan showed signs of infection. He was admitted to the hospital on 03/03/2021 for IV antibiotics and plan for surgery. He had a Left shoulder incision and debridement with antibiotic spacer replacement 03-04-21. He was being seen by Infectious Disease. P. Acnes was isolated and he was continued on antibiotics. His shoulder pain continued to increase since surgery. MRI was obtained. MRI showed concerns for worsening septic arthritis. He underwent a second shoulder debridement with antibiotic spacer on 04/29/2021. He has been following up with Dr. Tommy Medal with Infectious Disease. He had pacemaker placement and cardiology has cleared him for surgery. He did have some increase in his pain since pacemaker placement. Ultrasound evaluation showed some fluid in the joint, however culture results were negative.   Symptoms are rated as moderate to severe, and have been worsening.  This is significantly impairing activities of daily living.    Please see clinic note for further details on this patient's care.    He has elected for surgical management.   Past Medical History:  Diagnosis Date   AICD (automatic cardioverter/defibrillator) present    Arthritis    "hands primarily" (05/05/2016)   Atrial  fibrillation (HCC)    Back pain    age related   CHF (congestive heart failure) ([place])    no problemes since 9326   Complication of anesthesia    Bronchial spasms   COPD (chronic obstructive pulmonary disease) (HCC)    inhalers as needed   Coronary artery disease    Depression    takes Zoloft daily   Diabetic peripheral neuropathy ([place]) 01/03/2019   Dysrhythmia    A Fib-takes Xarelto daily   Enlarged prostate    benign   GERD (gastroesophageal reflux disease)    takes Omeprazole daily   Gout    History of blood clots    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)   Hyperlipidemia    takes Atorvastatin daily   Hypertension    takes Metoprolol,HCTZ,and Losartan daily.Takes Clonidine if needed   Hyperuricemia 07/14/2021   Nocturia    Peripheral neuropathy    takes Gabapentin daily   Peripheral vascular disease (HCC)    legs blockage    Pneumonia ?2015   Presence of permanent cardiac pacemaker    Restless leg    Sleep apnea    uses Bipap   Stroke [facility]) 2012   denies residual on 05/05/2016   Past Surgical History:  Procedure Laterality Date   AMPUTATION Left 04/03/2021   Procedure: LEFT 2ND RAY AMPUTATION;  Surgeon: Newt Minion, MD;  Location: [place];  Service: Orthopedics;  Laterality: Left;   CARPAL TUNNEL RELEASE     CATARACT EXTRACTION W/ INTRAOCULAR LENS  IMPLANT, BILATERAL Bilateral    COLONOSCOPY WITH PROPOFOL  N/A 03/09/2017   Procedure: COLONOSCOPY WITH PROPOFOL;  Surgeon: Wonda Horner, MD;  Location: [facility] ENDOSCOPY;  Service: Endoscopy;  Laterality: N/A;   COLONOSCOPY WITH PROPOFOL N/A 04/03/2020   Procedure: COLONOSCOPY WITH PROPOFOL;  Surgeon: Wonda Horner, MD;  Location: [facility] ENDOSCOPY;  Service: Endoscopy;  Laterality: N/A;   I & D KNEE WITH POLY EXCHANGE Right 05/04/2016   IRRIGATION AND DEBRIDEMENT Right KNEE WITH UNICOMPARTMENTAL POLY EXCHANGE   I & D KNEE WITH POLY EXCHANGE Right 05/04/2016   Procedure: IRRIGATION AND DEBRIDEMENT Right KNEE WITH  UNICOMPARTMENTAL POLY EXCHANGE;  Surgeon: Renette Butters, MD;  Location: [place];  Service: Orthopedics;  Laterality: Right;   JOINT REPLACEMENT     LAPAROSCOPIC CHOLECYSTECTOMY  1995   PARTIAL KNEE ARTHROPLASTY Right 04/06/2016   Procedure: UNICOMPARTMENTAL RIGHT KNEE;  Surgeon: Renette Butters, MD;  Location: [place];  Service: Orthopedics;  Laterality: Right;   POLYPECTOMY  04/03/2020   Procedure: POLYPECTOMY;  Surgeon: Wonda Horner, MD;  Location: [facility] ENDOSCOPY;  Service: Endoscopy;;   REVERSE SHOULDER ARTHROPLASTY Left 04/29/2021   Procedure: Revision antibiotic spacer placement. Incision and drainage.;  Surgeon: [name], MD;  Location: [facility];  Service: Orthopedics;  Laterality: Left;   STRABISMUS SURGERY Bilateral 06/10/2017   Procedure: REPAIR STRABISMUS BILATERAL;  Surgeon: Everitt Amber, MD;  Location: [place];  Service: Ophthalmology;  Laterality: Bilateral;   TONSILLECTOMY AND ADENOIDECTOMY     TOTAL KNEE ARTHROPLASTY Left 2008   TOTAL SHOULDER ARTHROPLASTY Left 03/04/2021   Procedure: Total Shoulder Arthroplasty;  Surgeon: [name], MD;  Location: [facility];  Service: Orthopedics;  Laterality: Left;   ULNAR NERVE TRANSPOSITION Bilateral 1989   Social History   Socioeconomic History   Marital status: Married    Spouse name: Not on file   Number of children: Not on file   Years of education: Not on file   Highest education level: Not on file  Occupational History   Occupation: Retired Therapist, sports  Tobacco Use   Smoking status: Former    Packs/day: 0.50    Years: 40.00    Pack years: 20.00    Types: Cigarettes    Quit date: 07/15/2011    Years since quitting: 10.2   Smokeless tobacco: Former    Types: Nurse, children's Use: Never used  Substance and Sexual Activity   Alcohol use: Yes    Comment: 2 beers a month   Drug use: No   Sexual activity: Yes  Other Topics Concern   Not on file  Social History Narrative   Lives at home with spouse     Caffeine 2 cups of coffee daily    Right handed.    Social Determinants of Health   Financial Resource Strain: Not on file  Food Insecurity: Not on file  Transportation Needs: Not on file  Physical Activity: Not on file  Stress: Not on file  Social Connections: Not on file   Family History  Problem Relation Age of Onset   CVA Mother    Liver cancer Father    Allergies  Allergen Reactions   Penicillins Anaphylaxis    Reports an anaphylactic reaction when he was 13 to IM penicillin.  Has tolerated multiple cephalosporins in the past.   Tolerated Cephalosporin Date: 04/30/21.     Alfalfa     Burning in sinus    Dymista [Azelastine-Fluticasone] Other (See Comments)    Burning sensation    Flonase [Fluticasone  Propionate] Other (See Comments)    burning   Hydrocodone Other (See Comments)    SWEATING   Warfarin And Related Other (See Comments)    Pt states it makes his INR critical levels   Prior to Admission medications   Medication Sig Start Date End Date Taking? Authorizing Provider  acetaminophen (TYLENOL) 500 MG tablet Take 1,000 mg by mouth every 6 (six) hours as needed for moderate pain.   Yes [provider]  allopurinol (ZYLOPRIM) 100 MG tablet Take 2 tablets (200 mg total) by mouth 2 (two) times daily. 09/11/21  Yes Dondra Prader R, NP  atorvastatin (LIPITOR) 20 MG tablet Take 20 mg by mouth every other day. 02/06/16  Yes [provider]  Cholecalciferol (VITAMIN D) 50 MCG (2000 UT) tablet Take 2,000 Units by mouth daily.   Yes [provider]  cloNIDine (CATAPRES) 0.1 MG tablet Take 0.1 mg by mouth daily as needed (if pt if bp  > 160).   Yes [provider]  colchicine 0.6 MG tablet Take 1 tablet (0.6 mg total) by mouth daily. As needed for gout pain. 04/02/21  Yes Newt Minion, MD  cyclobenzaprine (FLEXERIL) 10 MG tablet Take 10 mg by mouth at bedtime as needed for muscle spasms.  01/06/20  Yes [provider]  Dextran  70-Hypromellose (ARTIFICIAL TEARS PF OP) Place 1 drop into both eyes 2 (two) times daily as needed (dry eyes).   Yes [provider]  ENTRESTO 24-26 MG Take 1 tablet by mouth 2 (two) times daily. 03/13/20  Yes [provider]  gabapentin (NEURONTIN) 300 MG capsule Take 900 mg by mouth 2 (two) times daily.   Yes [provider]  metolazone (ZAROXOLYN) 2.5 MG tablet Take 2.5 mg by mouth daily as needed (fluid retention).   Yes [provider]  metoprolol succinate (TOPROL-XL) 50 MG 24 hr tablet Take 50 mg by mouth in the morning. Take with or immediately following a meal.   Yes [provider]  minocycline (MINOCIN) 100 MG capsule Take 1 capsule (100 mg total) by mouth 2 (two) times daily. 06/11/21  Yes Tommy Medal, Lavell Islam, MD  omeprazole (PRILOSEC) 20 MG capsule Take 20 mg by mouth daily.   Yes [provider]  oxyCODONE-acetaminophen (PERCOCET/ROXICET) 5-325 MG tablet Take 1 tablet by mouth every 4 (four) hours as needed for severe pain. 08/03/21  Yes [provider]  potassium chloride (MICRO-K) 10 MEQ CR capsule Take 10 mEq by mouth in the morning.   Yes [provider]  rivaroxaban (XARELTO) 20 MG TABS tablet Take 20 mg by mouth in the morning.   Yes [provider]  Semaglutide, 1 MG/DOSE, (OZEMPIC, 1 MG/DOSE,) 2 MG/1.5ML SOPN Inject 1 mg into the skin once a week. 12/26/20  Yes [provider]  sertraline (ZOLOFT) 100 MG tablet Take 100 mg by mouth in the morning.   Yes [provider]  spironolactone (ALDACTONE) 25 MG tablet Take 25 mg by mouth in the morning.   Yes [provider]  torsemide (DEMADEX) 100 MG tablet Take 100 mg by mouth in the morning. 09/29/18  Yes [provider]  ACCU-CHEK GUIDE test strip TEST DAILY AS DIRECTED 03/30/21   [provider]  Accu-Chek Softclix Lancets lancets daily. 03/30/21   [provider]  nitroGLYCERIN (NITRODUR - DOSED IN MG/24  HR) 0.2 mg/hr patch Place 1 patch (0.2 mg total) onto the skin daily. Patient not taking: No sig reported 04/21/21  Persons, Bevely Palmer, PA    ROS: All other systems have been reviewed and were otherwise negative with the exception of those mentioned in the HPI and as above.  Physical Exam: General: Alert, no acute distress Cardiovascular: No pedal edema Respiratory: No cyanosis, no use of accessory musculature GI: No organomegaly, abdomen is soft and non-tender Skin: No lesions in the area of chief complaint Neurologic: Sensation intact distally Psychiatric: Patient is competent for consent with normal mood and affect Lymphatic: No axillary or cervical lymphadenopathy  MUSCULOSKELETAL:  Left shoulder: incision is benign. Tolerates about 90 degrees of forward elevation  Imaging: CT of the left shoulder demonstrates placement of antibiotic hemiarthroplasty with tear of subscapularis, supraspinatus, and infraspinatus  Assessment: LEFT DJD SHOULDER  Plan: Plan for Procedure(s): REVERSE SHOULDER ARTHROPLASTY  The risks benefits and alternatives were discussed with the patient including but not limited to the risks of nonoperative treatment, versus surgical intervention including infection, bleeding, nerve injury,  blood clots, cardiopulmonary complications, morbidity, mortality, among others, and they were willing to proceed.   We additionally specifically discussed risks of axillary nerve injury, infection, periprosthetic fracture, continued pain and longevity of implants prior to beginning procedure.    Patient will be admitted for inpatient treatment for surgery, pain control, OT, prophylactic antibiotics, VTE prophylaxis, and discharge planning. The patient is planning to be discharged home with outpatient PT.   The patient acknowledged the explanation, agreed to proceed with the plan and consent was signed.   Operative Plan: Left revision to reverse total shoulder arthroplasty   Discharge Medications: Standard DVT Prophylaxis: Resume xarelto Physical Therapy: outpatient pt Special Discharge needs: sling. Has iceman   Ethelda Chick, [place]  09/29/2021 8:15 PM

## 2021-09-30 ENCOUNTER — Ambulatory Visit (HOSPITAL_COMMUNITY): Payer: Medicare Other | Admitting: Physician Assistant

## 2021-09-30 ENCOUNTER — Encounter (HOSPITAL_COMMUNITY): Payer: Self-pay | Admitting: Orthopaedic Surgery

## 2021-09-30 ENCOUNTER — Other Ambulatory Visit: Payer: Self-pay

## 2021-09-30 ENCOUNTER — Observation Stay (HOSPITAL_COMMUNITY): Payer: Medicare Other

## 2021-09-30 ENCOUNTER — Observation Stay (HOSPITAL_COMMUNITY)
Admission: RE | Admit: 2021-09-30 | Discharge: 2021-10-01 | Disposition: A | Discharge status: Home / Self Care | Payer: Medicare Other | Source: Ambulatory Visit | Attending: Orthopaedic Surgery | Admitting: Orthopaedic Surgery

## 2021-09-30 ENCOUNTER — Encounter (HOSPITAL_COMMUNITY)
Admission: RE | Disposition: A | Discharge status: Home / Self Care | Payer: Self-pay | Source: Ambulatory Visit | Attending: Orthopaedic Surgery

## 2021-09-30 ENCOUNTER — Ambulatory Visit (HOSPITAL_COMMUNITY): Payer: Medicare Other | Admitting: Certified Registered"

## 2021-09-30 DIAGNOSIS — Z950 Presence of cardiac pacemaker: Secondary | ICD-10-CM | POA: Diagnosis not present

## 2021-09-30 DIAGNOSIS — I110 Hypertensive heart disease with heart failure: Secondary | ICD-10-CM | POA: Diagnosis not present

## 2021-09-30 DIAGNOSIS — Z9889 Other specified postprocedural states: Secondary | ICD-10-CM | POA: Diagnosis not present

## 2021-09-30 DIAGNOSIS — I509 Heart failure, unspecified: Secondary | ICD-10-CM | POA: Diagnosis not present

## 2021-09-30 DIAGNOSIS — Z96652 Presence of left artificial knee joint: Secondary | ICD-10-CM | POA: Diagnosis not present

## 2021-09-30 DIAGNOSIS — E119 Type 2 diabetes mellitus without complications: Secondary | ICD-10-CM | POA: Diagnosis not present

## 2021-09-30 DIAGNOSIS — Z20822 Contact with and (suspected) exposure to covid-19: Secondary | ICD-10-CM | POA: Insufficient documentation

## 2021-09-30 DIAGNOSIS — I482 Chronic atrial fibrillation, unspecified: Secondary | ICD-10-CM | POA: Diagnosis not present

## 2021-09-30 DIAGNOSIS — Z79899 Other long term (current) drug therapy: Secondary | ICD-10-CM | POA: Diagnosis not present

## 2021-09-30 DIAGNOSIS — I2510 Atherosclerotic heart disease of native coronary artery without angina pectoris: Secondary | ICD-10-CM | POA: Insufficient documentation

## 2021-09-30 DIAGNOSIS — J449 Chronic obstructive pulmonary disease, unspecified: Secondary | ICD-10-CM | POA: Insufficient documentation

## 2021-09-30 DIAGNOSIS — G8918 Other acute postprocedural pain: Secondary | ICD-10-CM | POA: Diagnosis not present

## 2021-09-30 DIAGNOSIS — Z87891 Personal history of nicotine dependence: Secondary | ICD-10-CM | POA: Insufficient documentation

## 2021-09-30 DIAGNOSIS — I4891 Unspecified atrial fibrillation: Secondary | ICD-10-CM | POA: Insufficient documentation

## 2021-09-30 DIAGNOSIS — Z7901 Long term (current) use of anticoagulants: Secondary | ICD-10-CM | POA: Diagnosis not present

## 2021-09-30 DIAGNOSIS — Z471 Aftercare following joint replacement surgery: Secondary | ICD-10-CM | POA: Diagnosis not present

## 2021-09-30 DIAGNOSIS — M19012 Primary osteoarthritis, left shoulder: Principal | ICD-10-CM | POA: Insufficient documentation

## 2021-09-30 DIAGNOSIS — Z96612 Presence of left artificial shoulder joint: Secondary | ICD-10-CM | POA: Diagnosis not present

## 2021-09-30 DIAGNOSIS — K219 Gastro-esophageal reflux disease without esophagitis: Secondary | ICD-10-CM | POA: Diagnosis not present

## 2021-09-30 DIAGNOSIS — Z09 Encounter for follow-up examination after completed treatment for conditions other than malignant neoplasm: Secondary | ICD-10-CM

## 2021-09-30 HISTORY — PX: REVERSE SHOULDER ARTHROPLASTY: SHX5054

## 2021-09-30 LAB — TYPE AND SCREEN
ABO/RH(D): O POS
Antibody Screen: NEGATIVE

## 2021-09-30 LAB — GLUCOSE, CAPILLARY
Glucose-Capillary: 147 mg/dL — ABNORMAL HIGH (ref 70–99)
Glucose-Capillary: 169 mg/dL — ABNORMAL HIGH (ref 70–99)
Glucose-Capillary: 184 mg/dL — ABNORMAL HIGH (ref 70–99)

## 2021-09-30 LAB — HEMOGLOBIN AND HEMATOCRIT, BLOOD
HCT: 43.2 % (ref 39.0–52.0)
Hemoglobin: 14 g/dL (ref 13.0–17.0)

## 2021-09-30 LAB — SARS CORONAVIRUS 2 BY RT PCR (HOSPITAL ORDER, PERFORMED IN ~~LOC~~ HOSPITAL LAB): SARS Coronavirus 2: NEGATIVE

## 2021-09-30 SURGERY — ARTHROPLASTY, SHOULDER, TOTAL, REVERSE
Anesthesia: General | Site: Shoulder | Laterality: Left

## 2021-09-30 MED ORDER — ORAL CARE MOUTH RINSE: 15.0000 mL | Freq: Once | OROMUCOSAL | Status: DC

## 2021-09-30 MED ORDER — SERTRALINE HCL 100 MG PO TABS
100.0000 mg | ORAL_TABLET | Freq: Every day | ORAL | Status: DC
  Administered 2021-10-01: 100 mg via ORAL
  Filled 2021-09-30: qty 1

## 2021-09-30 MED ORDER — ETOMIDATE 2 MG/ML IV SOLN
INTRAVENOUS | Status: AC
  Filled 2021-09-30: qty 10

## 2021-09-30 MED ORDER — ROCURONIUM BROMIDE 10 MG/ML (PF) SYRINGE
PREFILLED_SYRINGE | INTRAVENOUS | Status: AC
  Filled 2021-09-30: qty 10

## 2021-09-30 MED ORDER — METOCLOPRAMIDE HCL 5 MG PO TABS: 5.0000 mg | ORAL_TABLET | Freq: Three times a day (TID) | ORAL | Status: DC | PRN

## 2021-09-30 MED ORDER — METOLAZONE 2.5 MG PO TABS
2.5000 mg | ORAL_TABLET | Freq: Every day | ORAL | Status: DC | PRN
  Filled 2021-09-30: qty 1

## 2021-09-30 MED ORDER — SUGAMMADEX SODIUM 500 MG/5ML IV SOLN
INTRAVENOUS | Status: AC
  Filled 2021-09-30: qty 5

## 2021-09-30 MED ORDER — VANCOMYCIN HCL IN DEXTROSE 1-5 GM/200ML-% IV SOLN
1000.0000 mg | Freq: Two times a day (BID) | INTRAVENOUS | Status: AC
Start: 2021-09-30 — End: 2021-09-30
  Administered 2021-09-30: 1000 mg via INTRAVENOUS
  Filled 2021-09-30: qty 200

## 2021-09-30 MED ORDER — METHOCARBAMOL 500 MG IVPB - SIMPLE MED
500.0000 mg | Freq: Four times a day (QID) | INTRAVENOUS | Status: DC | PRN
  Filled 2021-09-30: qty 50

## 2021-09-30 MED ORDER — FENTANYL CITRATE PF 50 MCG/ML IJ SOSY: 25.0000 ug | PREFILLED_SYRINGE | INTRAMUSCULAR | Status: DC | PRN

## 2021-09-30 MED ORDER — PANTOPRAZOLE SODIUM 40 MG PO TBEC
40.0000 mg | DELAYED_RELEASE_TABLET | Freq: Every day | ORAL | Status: DC
  Administered 2021-10-01: 40 mg via ORAL
  Filled 2021-09-30: qty 1

## 2021-09-30 MED ORDER — ACETAMINOPHEN 10 MG/ML IV SOLN
1000.0000 mg | Freq: Once | INTRAVENOUS | Status: DC | PRN
Start: 2021-09-30 — End: 2021-09-30

## 2021-09-30 MED ORDER — MIDAZOLAM HCL 2 MG/2ML IJ SOLN
1.0000 mg | INTRAMUSCULAR | Status: AC
  Administered 2021-09-30: 1 mg via INTRAVENOUS

## 2021-09-30 MED ORDER — LACTATED RINGERS IV SOLN: INTRAVENOUS | Status: DC

## 2021-09-30 MED ORDER — SODIUM CHLORIDE 0.9 % IV SOLN
2.0000 g | INTRAVENOUS | Status: DC
  Administered 2021-09-30: 2 g via INTRAVENOUS
  Filled 2021-09-30: qty 20

## 2021-09-30 MED ORDER — GABAPENTIN 300 MG PO CAPS
300.0000 mg | ORAL_CAPSULE | Freq: Three times a day (TID) | ORAL | Status: DC
  Administered 2021-09-30 – 2021-10-01 (×2): 300 mg via ORAL
  Filled 2021-09-30 (×2): qty 1

## 2021-09-30 MED ORDER — TRANEXAMIC ACID-NACL 1000-0.7 MG/100ML-% IV SOLN
1000.0000 mg | INTRAVENOUS | Status: AC
  Administered 2021-09-30: 1000 mg via INTRAVENOUS
  Filled 2021-09-30: qty 100

## 2021-09-30 MED ORDER — BISACODYL 10 MG RE SUPP: 10.0000 mg | Freq: Every day | RECTAL | Status: DC | PRN

## 2021-09-30 MED ORDER — ONDANSETRON HCL 4 MG/2ML IJ SOLN
INTRAMUSCULAR | Status: AC
  Filled 2021-09-30: qty 2

## 2021-09-30 MED ORDER — HYDROMORPHONE HCL 1 MG/ML IJ SOLN
0.5000 mg | INTRAMUSCULAR | Status: DC | PRN
Start: 2021-09-30 — End: 2021-10-01

## 2021-09-30 MED ORDER — CEFAZOLIN SODIUM-DEXTROSE 2-4 GM/100ML-% IV SOLN
INTRAVENOUS | Status: AC
  Filled 2021-09-30: qty 100

## 2021-09-30 MED ORDER — SODIUM CHLORIDE 0.9 % IR SOLN
Status: DC | PRN
  Administered 2021-09-30: 3000 mL
  Administered 2021-09-30: 1000 mL

## 2021-09-30 MED ORDER — VANCOMYCIN HCL 1000 MG IV SOLR
INTRAVENOUS | Status: AC
  Filled 2021-09-30: qty 20

## 2021-09-30 MED ORDER — STERILE WATER FOR IRRIGATION IR SOLN
Status: DC | PRN
  Administered 2021-09-30: 1000 mL

## 2021-09-30 MED ORDER — PHENOL 1.4 % MT LIQD: 1.0000 | OROMUCOSAL | Status: DC | PRN

## 2021-09-30 MED ORDER — TORSEMIDE 100 MG PO TABS
100.0000 mg | ORAL_TABLET | Freq: Every day | ORAL | Status: DC
  Administered 2021-10-01: 100 mg via ORAL
  Filled 2021-09-30: qty 1

## 2021-09-30 MED ORDER — TOBRAMYCIN SULFATE 1.2 G IJ SOLR
INTRAMUSCULAR | Status: DC | PRN
  Administered 2021-09-30: 1.2 g via TOPICAL

## 2021-09-30 MED ORDER — TOBRAMYCIN SULFATE 1.2 G IJ SOLR
INTRAMUSCULAR | Status: AC
  Filled 2021-09-30: qty 1.2

## 2021-09-30 MED ORDER — FENTANYL CITRATE PF 50 MCG/ML IJ SOSY: 50.0000 ug | PREFILLED_SYRINGE | INTRAMUSCULAR | Status: DC

## 2021-09-30 MED ORDER — SACUBITRIL-VALSARTAN 24-26 MG PO TABS
1.0000 | ORAL_TABLET | Freq: Two times a day (BID) | ORAL | Status: DC
  Administered 2021-09-30 – 2021-10-01 (×2): 1 via ORAL
  Filled 2021-09-30 (×2): qty 1

## 2021-09-30 MED ORDER — OXYCODONE HCL 5 MG PO TABS
10.0000 mg | ORAL_TABLET | ORAL | Status: DC | PRN
  Filled 2021-09-30: qty 2

## 2021-09-30 MED ORDER — DEXAMETHASONE SODIUM PHOSPHATE 10 MG/ML IJ SOLN
INTRAMUSCULAR | Status: DC | PRN
  Administered 2021-09-30: 4 mg via INTRAVENOUS

## 2021-09-30 MED ORDER — DIPHENHYDRAMINE HCL 12.5 MG/5ML PO ELIX: 12.5000 mg | ORAL_SOLUTION | ORAL | Status: DC | PRN

## 2021-09-30 MED ORDER — RIVAROXABAN 10 MG PO TABS: 20.0000 mg | ORAL_TABLET | Freq: Every day | ORAL | Status: DC

## 2021-09-30 MED ORDER — MIDAZOLAM HCL 2 MG/2ML IJ SOLN
INTRAMUSCULAR | Status: AC
  Filled 2021-09-30: qty 2

## 2021-09-30 MED ORDER — ACETAMINOPHEN 500 MG PO TABS
1000.0000 mg | ORAL_TABLET | Freq: Once | ORAL | Status: AC
  Administered 2021-09-30: 1000 mg via ORAL
  Filled 2021-09-30: qty 2

## 2021-09-30 MED ORDER — LIDOCAINE 2% (20 MG/ML) 5 ML SYRINGE
INTRAMUSCULAR | Status: DC | PRN
  Administered 2021-09-30: 80 mg via INTRAVENOUS

## 2021-09-30 MED ORDER — PHENYLEPHRINE HCL-NACL 20-0.9 MG/250ML-% IV SOLN
INTRAVENOUS | Status: DC | PRN
  Administered 2021-09-30: 20 ug/min via INTRAVENOUS

## 2021-09-30 MED ORDER — CHLORHEXIDINE GLUCONATE 0.12 % MT SOLN: 15.0000 mL | Freq: Once | OROMUCOSAL | Status: DC

## 2021-09-30 MED ORDER — ROCURONIUM BROMIDE 10 MG/ML (PF) SYRINGE
PREFILLED_SYRINGE | INTRAVENOUS | Status: DC | PRN
  Administered 2021-09-30: 80 mg via INTRAVENOUS
  Administered 2021-09-30: 20 mg via INTRAVENOUS

## 2021-09-30 MED ORDER — FENTANYL CITRATE (PF) 100 MCG/2ML IJ SOLN
INTRAMUSCULAR | Status: AC
  Filled 2021-09-30: qty 2

## 2021-09-30 MED ORDER — PROPOFOL 10 MG/ML IV BOLUS
INTRAVENOUS | Status: AC
  Filled 2021-09-30: qty 20

## 2021-09-30 MED ORDER — ACETAMINOPHEN 500 MG PO TABS
1000.0000 mg | ORAL_TABLET | Freq: Three times a day (TID) | ORAL | Status: DC
  Administered 2021-09-30: 1000 mg via ORAL
  Filled 2021-09-30 (×2): qty 2

## 2021-09-30 MED ORDER — ALLOPURINOL 100 MG PO TABS
200.0000 mg | ORAL_TABLET | Freq: Two times a day (BID) | ORAL | Status: DC
Start: 2021-09-30 — End: 2021-10-01
  Administered 2021-09-30 – 2021-10-01 (×2): 200 mg via ORAL
  Filled 2021-09-30 (×2): qty 2

## 2021-09-30 MED ORDER — METHOCARBAMOL 500 MG PO TABS
500.0000 mg | ORAL_TABLET | Freq: Four times a day (QID) | ORAL | Status: DC | PRN
  Filled 2021-09-30: qty 1

## 2021-09-30 MED ORDER — FENTANYL CITRATE PF 50 MCG/ML IJ SOSY
PREFILLED_SYRINGE | INTRAMUSCULAR | Status: AC
  Filled 2021-09-30: qty 2

## 2021-09-30 MED ORDER — METOPROLOL SUCCINATE ER 50 MG PO TB24
50.0000 mg | ORAL_TABLET | Freq: Every day | ORAL | Status: DC
  Administered 2021-10-01: 50 mg via ORAL
  Filled 2021-09-30: qty 1

## 2021-09-30 MED ORDER — CEFAZOLIN IN SODIUM CHLORIDE 3-0.9 GM/100ML-% IV SOLN
INTRAVENOUS | Status: AC
  Filled 2021-09-30: qty 100

## 2021-09-30 MED ORDER — POLYETHYLENE GLYCOL 3350 17 G PO PACK: 17.0000 g | PACK | Freq: Every day | ORAL | Status: DC | PRN

## 2021-09-30 MED ORDER — CLONIDINE HCL 0.1 MG PO TABS: 0.1000 mg | ORAL_TABLET | Freq: Every day | ORAL | Status: DC | PRN

## 2021-09-30 MED ORDER — ONDANSETRON HCL 4 MG/2ML IJ SOLN
INTRAMUSCULAR | Status: DC | PRN
  Administered 2021-09-30: 4 mg via INTRAVENOUS

## 2021-09-30 MED ORDER — SPIRONOLACTONE 25 MG PO TABS
25.0000 mg | ORAL_TABLET | Freq: Every day | ORAL | Status: DC
  Administered 2021-10-01: 25 mg via ORAL
  Filled 2021-09-30: qty 1

## 2021-09-30 MED ORDER — SUGAMMADEX SODIUM 200 MG/2ML IV SOLN
INTRAVENOUS | Status: DC | PRN
  Administered 2021-09-30: 260 mg via INTRAVENOUS

## 2021-09-30 MED ORDER — EPHEDRINE SULFATE-NACL 50-0.9 MG/10ML-% IV SOSY
PREFILLED_SYRINGE | INTRAVENOUS | Status: DC | PRN
  Administered 2021-09-30: 5 mg via INTRAVENOUS

## 2021-09-30 MED ORDER — BUPIVACAINE HCL (PF) 0.25 % IJ SOLN
INTRAMUSCULAR | Status: AC
  Filled 2021-09-30: qty 30

## 2021-09-30 MED ORDER — DEXAMETHASONE SODIUM PHOSPHATE 10 MG/ML IJ SOLN
INTRAMUSCULAR | Status: AC
  Filled 2021-09-30: qty 1

## 2021-09-30 MED ORDER — POTASSIUM CHLORIDE CRYS ER 10 MEQ PO TBCR
10.0000 meq | EXTENDED_RELEASE_TABLET | Freq: Once | ORAL | Status: AC
  Administered 2021-09-30: 10 meq via ORAL
  Filled 2021-09-30: qty 1

## 2021-09-30 MED ORDER — FENTANYL CITRATE (PF) 250 MCG/5ML IJ SOLN
INTRAMUSCULAR | Status: DC | PRN
  Administered 2021-09-30: 25 ug via INTRAVENOUS
  Administered 2021-09-30: 50 ug via INTRAVENOUS

## 2021-09-30 MED ORDER — CEFAZOLIN IN SODIUM CHLORIDE 3-0.9 GM/100ML-% IV SOLN
3.0000 g | INTRAVENOUS | Status: AC
  Administered 2021-09-30: 3 g via INTRAVENOUS

## 2021-09-30 MED ORDER — MENTHOL 3 MG MT LOZG: 1.0000 | LOZENGE | OROMUCOSAL | Status: DC | PRN

## 2021-09-30 MED ORDER — DOCUSATE SODIUM 100 MG PO CAPS
100.0000 mg | ORAL_CAPSULE | Freq: Two times a day (BID) | ORAL | Status: DC
  Administered 2021-09-30 – 2021-10-01 (×2): 100 mg via ORAL
  Filled 2021-09-30 (×2): qty 1

## 2021-09-30 MED ORDER — ETOMIDATE 2 MG/ML IV SOLN
INTRAVENOUS | Status: DC | PRN
  Administered 2021-09-30: 16 mg via INTRAVENOUS
  Administered 2021-09-30: 4 mg via INTRAVENOUS

## 2021-09-30 MED ORDER — CHLORHEXIDINE GLUCONATE 0.12 % MT SOLN
15.0000 mL | Freq: Once | OROMUCOSAL | Status: DC
  Administered 2021-09-30: 15 mL via OROMUCOSAL

## 2021-09-30 MED ORDER — ONDANSETRON HCL 4 MG/2ML IJ SOLN: 4.0000 mg | Freq: Once | INTRAMUSCULAR | Status: DC | PRN

## 2021-09-30 MED ORDER — METOCLOPRAMIDE HCL 5 MG/ML IJ SOLN: 5.0000 mg | Freq: Three times a day (TID) | INTRAMUSCULAR | Status: DC | PRN

## 2021-09-30 MED ORDER — 0.9 % SODIUM CHLORIDE (POUR BTL) OPTIME
TOPICAL | Status: DC | PRN
  Administered 2021-09-30: 1000 mL

## 2021-09-30 MED ORDER — OXYCODONE HCL 5 MG PO TABS
5.0000 mg | ORAL_TABLET | ORAL | Status: DC | PRN
  Administered 2021-09-30: 10 mg via ORAL

## 2021-09-30 MED ORDER — VANCOMYCIN HCL 1 G IV SOLR
INTRAVENOUS | Status: DC | PRN
  Administered 2021-09-30: 1000 mg via TOPICAL

## 2021-09-30 SURGICAL SUPPLY — 71 items
BAG COUNTER SPONGE SURGICOUNT (BAG) ×2 IMPLANT
BASEPLATE GLENOID STD REV 42 (Joint) ×2 IMPLANT
BASEPLATE REV SHOULDER 29 OD (Plate) ×2 IMPLANT
BIT DRILL 3.2 PERIPHERAL SCREW (BIT) ×2 IMPLANT
BLADE SAW SAG 73X25 THK (BLADE) ×1
BLADE SAW SGTL 73X25 THK (BLADE) ×1 IMPLANT
CAP LOCKING COCR (Cap) ×2 IMPLANT
CHLORAPREP W/TINT 26 (MISCELLANEOUS) ×4 IMPLANT
CLSR STERI-STRIP ANTIMIC 1/2X4 (GAUZE/BANDAGES/DRESSINGS) ×2 IMPLANT
COOLER ICEMAN CLASSIC (MISCELLANEOUS) ×2 IMPLANT
COVER BACK TABLE 60X90IN (DRAPES) IMPLANT
COVER SURGICAL LIGHT HANDLE (MISCELLANEOUS) ×2 IMPLANT
DRAPE C-ARM 42X120 X-RAY (DRAPES) IMPLANT
DRAPE INCISE IOBAN 66X45 STRL (DRAPES) ×2 IMPLANT
DRAPE ORTHO SPLIT 77X108 STRL (DRAPES) ×2
DRAPE SHEET LG 3/4 BI-LAMINATE (DRAPES) ×4 IMPLANT
DRAPE SURG ORHT 6 SPLT 77X108 (DRAPES) ×2 IMPLANT
DRSG AQUACEL AG ADV 3.5X 6 (GAUZE/BANDAGES/DRESSINGS) ×2 IMPLANT
DRSG AQUACEL AG ADV 3.5X10 (GAUZE/BANDAGES/DRESSINGS) ×2 IMPLANT
ELECT BLADE TIP CTD 4 INCH (ELECTRODE) ×2 IMPLANT
ELECT REM PT RETURN 15FT ADLT (MISCELLANEOUS) ×2 IMPLANT
FACESHIELD WRAPAROUND (MASK) ×2 IMPLANT
GAUZE XEROFORM 1X8 LF (GAUZE/BANDAGES/DRESSINGS) ×2 IMPLANT
GLOVE SRG 8 PF TXTR STRL LF DI (GLOVE) ×1 IMPLANT
GLOVE SURG ENC MOIS LTX SZ6.5 (GLOVE) ×4 IMPLANT
GLOVE SURG NEOPR MICRO LF SZ8 (GLOVE) ×4 IMPLANT
GLOVE SURG UNDER POLY LF SZ6.5 (GLOVE) ×2 IMPLANT
GLOVE SURG UNDER POLY LF SZ8 (GLOVE) ×1
GOWN STRL REUS W/TWL LRG LVL3 (GOWN DISPOSABLE) ×2 IMPLANT
GOWN STRL REUS W/TWL XL LVL3 (GOWN DISPOSABLE) ×2 IMPLANT
GUIDEWIRE GLENOID 2.5X220 (WIRE) ×2 IMPLANT
HANDPIECE INTERPULSE COAX TIP (DISPOSABLE) ×1
HEMOSTAT SURGICEL 2X14 (HEMOSTASIS) IMPLANT
IMPL PROX BODY PTC 15X132 (Stem) ×1 IMPLANT
IMPL REVERSE SHOULDER 0X3.5 (Shoulder) ×1 IMPLANT
IMPLANT PROX BODY PTC 15X132 (Stem) ×2 IMPLANT
IMPLANT REVERSE SHOULDER 0X3.5 (Shoulder) ×2 IMPLANT
INSERT SHLD REV 42X6 ANGLE B (Insert) ×2 IMPLANT
KIT BASIN OR (CUSTOM PROCEDURE TRAY) ×2 IMPLANT
KIT STABILIZATION SHOULDER (MISCELLANEOUS) ×2 IMPLANT
KIT TURNOVER KIT A (KITS) IMPLANT
MANIFOLD NEPTUNE II (INSTRUMENTS) ×2 IMPLANT
NEEDLE MAYO CATGUT SZ4 (NEEDLE) IMPLANT
NS IRRIG 1000ML POUR BTL (IV SOLUTION) ×2 IMPLANT
PACK SHOULDER (CUSTOM PROCEDURE TRAY) ×2 IMPLANT
PAD COLD SHLDR WRAP-ON (PAD) ×2 IMPLANT
RESTRAINT HEAD UNIVERSAL NS (MISCELLANEOUS) ×2 IMPLANT
SCREW 5.0X38 SMALL F/PERFORM (Screw) ×2 IMPLANT
SCREW 5.5X26 (Screw) ×2 IMPLANT
SCREW ASSEMBLY COCR TYPE 0 (Screw) ×2 IMPLANT
SCREW CORT THRD AEQ 9.5X40 (Screw) ×2 IMPLANT
SET HNDPC FAN SPRY TIP SCT (DISPOSABLE) ×1 IMPLANT
SLING ARM FOAM STRAP XLG (SOFTGOODS) ×2 IMPLANT
SLING ARM IMMOBILIZER XL (CAST SUPPLIES) ×2 IMPLANT
SLING ULTRA II L (ORTHOPEDIC SUPPLIES) IMPLANT
SLING ULTRA III MED (ORTHOPEDIC SUPPLIES) ×2 IMPLANT
STEM PTC DISTAL 15X90 (Stem) ×2 IMPLANT
SUCTION FRAZIER HANDLE 12FR (TUBING) ×1
SUCTION TUBE FRAZIER 12FR DISP (TUBING) ×1 IMPLANT
SUT ETHIBOND 2 V 37 (SUTURE) ×2 IMPLANT
SUT ETHIBOND NAB CT1 #1 30IN (SUTURE) ×2 IMPLANT
SUT ETHILON 2 0 PS N (SUTURE) ×6 IMPLANT
SUT FIBERWIRE #5 38 CONV NDL (SUTURE)
SUT MNCRL AB 4-0 PS2 18 (SUTURE) ×2 IMPLANT
SUT VIC AB 0 CT1 36 (SUTURE) IMPLANT
SUT VIC AB 3-0 SH 27 (SUTURE) ×3
SUT VIC AB 3-0 SH 27X BRD (SUTURE) ×3 IMPLANT
SUTURE FIBERWR #5 38 CONV NDL (SUTURE) IMPLANT
TOWEL OR 17X26 10 PK STRL BLUE (TOWEL DISPOSABLE) ×2 IMPLANT
TUBE SUCTION HIGH CAP CLEAR NV (SUCTIONS) ×2 IMPLANT
WATER STERILE IRR 1000ML POUR (IV SOLUTION) ×4 IMPLANT

## 2021-09-30 NOTE — Plan of Care (Signed)
Discussed with patient and wife about plan of care for post-op day 0.   Will continue to monitor patient.    S[place]ittemore, RN  

## 2021-09-30 NOTE — Transfer of Care (Signed)
Immediate Anesthesia Transfer of Care Note  Patient: Willie Pineda  Procedure(s) Performed: REVERSE SHOULDER ARTHROPLASTY (Left: Shoulder)  Patient Location: PACU  Anesthesia Type:General  Level of Consciousness: drowsy and patient cooperative  Airway & Oxygen Therapy: Patient Spontanous Breathing and Patient connected to face mask oxygen  Post-op Assessment: Report given to RN and Post -op Vital signs reviewed and stable  Post vital signs: Reviewed and stable  Last Vitals:  Vitals Value Taken Time  BP 110/53 09/30/21 1417  Temp    Pulse 89 09/30/21 1422  Resp 18 09/30/21 1422  SpO2 97 % 09/30/21 1422  Vitals shown include unvalidated device data.  Last Pain:  Vitals:   09/30/21 0910  TempSrc:   PainSc: 0-No pain      Patients Stated Pain Goal: 3 (10/04/12 1438)  Complications: No notable events documented.

## 2021-09-30 NOTE — Interval H&P Note (Signed)
All questions answered, patient wants to proceed with procedure. ? ?

## 2021-09-30 NOTE — Anesthesia Postprocedure Evaluation (Signed)
Anesthesia Post Note  Patient: Willie Pineda  Procedure(s) Performed: REVERSE SHOULDER ARTHROPLASTY (Left: Shoulder)     Patient location during evaluation: PACU Anesthesia Type: General Level of consciousness: awake and alert Pain management: pain level controlled Vital Signs Assessment: post-procedure vital signs reviewed and stable Respiratory status: spontaneous breathing, nonlabored ventilation, respiratory function stable and patient connected to nasal cannula oxygen Cardiovascular status: blood pressure returned to baseline and stable Postop Assessment: no apparent nausea or vomiting Anesthetic complications: no   No notable events documented.  Last Vitals:  Vitals:   09/30/21 1530 09/30/21 1545  BP: 94/66 102/69  Pulse: 76 76  Resp: (!) 21 16  Temp:    SpO2: 93% 93%    Last Pain:  Vitals:   09/30/21 1500  TempSrc:   PainSc: 0-No pain                 Barnet Glasgow

## 2021-09-30 NOTE — OR Nursing (Signed)
Sterile magnet placed by surgeon, Dr. Adele Dan, over patient's pacemaker at 1212.

## 2021-09-30 NOTE — Op Note (Signed)
Orthopaedic Surgery Operative Note (CSN: [id number])  Willie Pineda  1946-04-28 Date of Surgery: 09/30/2021   Diagnoses:  Left shoulder infection  Procedure: Left revision reverse total Shoulder Arthroplasty from hemiarthroplasty Left shoulder removal of hardware with synovectomy [phone]   Operative Finding Successful completion of planned procedure.  Patient's approach was quite difficult due to his multiple previous open surgeries.  There was no sign of gross purulence.  We were able to perform a synovectomy and remove the hemiarthroplasty implantation.  We able to get good fixation in the glenoid and good stability with mildly higher than normal tension in the implantation.  Patient had no subscapularis or superior cuff to speak of.  Part of his teres minor was still intact.  We were unable to perform a tug test secondary to his significant scarring.  Post-operative plan: The patient will be NWB in sling.  The patient will be will be admitted to observation due to medical complexity, monitoring and pain management.  DVT prophylaxis not indicated as patient already on full dose anticoagulant.  Pain control with PRN pain medication preferring oral medicines.  Follow up plan will be scheduled in approximately 7 days for incision check and XR.  Physical therapy to start after 4 weeks.  Implants: Tornier revive stem size 15 partially coated with 15 proximal body, no spacer.  29 standard baseplate with a 9.5 x 40 mm center screw, 49 glenosphere, 0 high offset tray with a 6 mm polyethylene  Post-Op Diagnosis: Same Surgeons:Primary: [name], MD Assistants:Caroline McBane PA-C Location: [facility] ROOM 06 Anesthesia: General with Exparel Interscalene Antibiotics: Ancef 3g preop, Vancomycin $RemoveBeforeDEI'1000mg'fwbrVJcJYrHSKkbS$  locally, 1.2 g of tobramycin powder locally Tourniquet time: None Estimated Blood Loss: 010 Complications: None Specimens: 2 for culture hold for 3 weeks for P acnes Implants: Implant Name Type Inv.  Item Serial No. Manufacturer Lot No. LRB No. Used Action  BASEPLATE REV SHOULDER 93AT OD - [id number] Plate BASEPLATE REV SHOULDER 42HC OD  [employer] [id number] Left 1 Implanted  BASEPLATE GLENOID STD REV 42 - [id number] Joint BASEPLATE GLENOID STD REV 42  [employer] [id number] Left 1 Implanted  IMPLANT REVERSE SHOULDER 0X3.5 - [id number] Shoulder IMPLANT REVERSE SHOULDER 0X3.[place] [id number] Left 1 Implanted  IMPLANT PROX BODY PTC 15X132 - [id number] Stem IMPLANT PROX BODY PTC 15X132  [employer] [id number] Left 1 Implanted  STEM PTC DISTAL 15X90 - [id number] Stem STEM PTC DISTAL 15X90  [employer] [id number] Left 1 Implanted  CAP LOCKING COCR - [id number] Cap CAP LOCKING COCR  [employer] [id number] Left 1 Implanted  SCREW ASSEMBLY COCR TYPE 0 - [id number] Screw SCREW ASSEMBLY COCR TYPE 0  [employer] [id number] Left 1 Implanted  INSERT SHLD REV 42X6 ANGLE B - [id number] Insert INSERT SHLD REV 42X6 ANGLE B  [employer] [id number] Left 1 Implanted  SCREW BONE 6.5X40 SM - [id number] Screw SCREW BONE 6.5X40 SM  [employer]  Left 1 Implanted  SCREW 5.0X38 SMALL F/PERFORM - [id number] Screw SCREW 5.0X38 SMALL F/PERFORM  [employer]  Left 1 Implanted  SCREW 5.5X26 - [id number] Screw SCREW 5.5X26  [employer]  Left 1 Implanted    Indications for Surgery:   Willie Pineda is a 75 y.o. male with previous native shoulder infection with osteomyelitis of the humerus and the glenoid.  He subsequently went a revision washout and antibiotic spacer placement.  Labs stabilized and he was appropriate for implantation.  Benefits and risks of operative and nonoperative management were discussed prior to surgery  with patient/guardian(s) and informed consent form was completed.  Infection and need for further surgery were discussed as was prosthetic stability and cuff issues.  We additionally specifically discussed risks of axillary nerve injury, infection, periprosthetic fracture, continued pain and longevity of  implants prior to beginning procedure.      Procedure:   The patient was identified in the preoperative holding area where the surgical site was marked. Block placed by anesthesia with exparel.  The patient was taken to the OR where a procedural timeout was called and the above noted anesthesia was induced.  The patient was positioned beachchair on allen table with spider arm positioner.  Preoperative antibiotics were dosed.  The patient's left shoulder was prepped and draped in the usual sterile fashion.  A second preoperative timeout was called.       We open the previous deltopectoral incision sharply obtaining hemostasis we progressed.  We used the previous scar as her guide for where the deltopectoral interval was.  We did neck the cephalic vein which was cauterized.  We continue to approach and stayed relatively lateral to avoid the medial neurovascular structures.  We used the coracoid as a guide for our location.  This was a difficult approach secondary to his previous surgeries.  We were able to expose the joint but there was no subscapularis to speak of.  The capsule was then removed and a synovectomy was performed.  The hemiarthroplasty cement component was removed and we performed a careful synovectomy around the posterior aspect of the joint as well as used a curette and rondure to debride the intramedullary space as well.  The intramedullary space was quite sclerotic and we did not obtain reasonable purchase with our attempts at a flex longstem implant.  We felt that a long stemmed revive implant instead would bypass our site of previous surgery.  We placed a temporary implant and proceeded with the glenoid.  We performed a synovectomy of the area around this glenoid however we did not expose around the axillary nerve is significantly scarred down.  We instead used blunt retractors to protect this area.  We peeled away the labrum and removed any remaining cartilage at the glenoid.  We placed  our pin in the center center location using a 29 guide.  We reamed flat taking some inferior bone.  At that point were able to drill for our vault which was intact.  We did not have appropriate purchase with a 6.5 mm tap and thus selected a 9.5 x 40 mm screw which obtained extraordinary purchase.  2 locking screws were placed superior and inferior.  That point we cleared any peripheral bone and placed a 42 glenosphere after irrigating.  Turned attention back to the humerus.  Our temporary implants were removed and we sized and obtained reasonable trial tension with a 15 mm stem and a 15 proximal body without a spacer.  We completed our synovectomy and irrigated with 3 L of saline.  At that point we were able to place our final implants.  There was no subscapularis for closure and thus we put her implants and slightly tighter than we typically do.  There is good stability of her final construct.  No undue tension on the conjoined tendon.  We irrigated again and placed our local vancomycin and tobramycin powder.  We closed a multilayer fashion closing the skin with a nonabsorbable nylon.  Sterile dressing was placed.  Sling was placed.  Patient was woken taken back in stable condition.  Noemi Chapel, PA-C, present and scrubbed throughout the case, critical for completion in a timely fashion, and for retraction, instrumentation, closure.

## 2021-09-30 NOTE — Anesthesia Procedure Notes (Signed)
Procedure Name: Intubation Date/Time: 09/30/2021 11:49 AM Performed by: Eben Burow, CRNA Pre-anesthesia Checklist: Patient identified, Emergency Drugs available, Suction available, Patient being monitored and Timeout performed Patient Re-evaluated:Patient Re-evaluated prior to induction Oxygen Delivery Method: Circle system utilized Preoxygenation: Pre-oxygenation with 100% oxygen Induction Type: IV induction Ventilation: Mask ventilation without difficulty Laryngoscope Size: Mac and 4 Grade View: Grade I Tube type: Oral Tube size: 7.5 mm Number of attempts: 1 Airway Equipment and Method: Stylet Placement Confirmation: ETT inserted through vocal cords under direct vision, positive ETCO2 and breath sounds checked- equal and bilateral Secured at: 23 cm Tube secured with: Tape Dental Injury: Teeth and Oropharynx as per pre-operative assessment

## 2021-09-30 NOTE — Progress Notes (Signed)
Assisted Dr. Valma Cava with left, ultrasound guided, interscalene  block. Side rails up, monitors on throughout procedure. See vital signs in flow sheet. Tolerated Procedure well.

## 2021-10-01 DIAGNOSIS — I4891 Unspecified atrial fibrillation: Secondary | ICD-10-CM | POA: Diagnosis not present

## 2021-10-01 DIAGNOSIS — Z87891 Personal history of nicotine dependence: Secondary | ICD-10-CM | POA: Diagnosis not present

## 2021-10-01 DIAGNOSIS — Z7901 Long term (current) use of anticoagulants: Secondary | ICD-10-CM | POA: Diagnosis not present

## 2021-10-01 DIAGNOSIS — I110 Hypertensive heart disease with heart failure: Secondary | ICD-10-CM | POA: Diagnosis not present

## 2021-10-01 DIAGNOSIS — Z20822 Contact with and (suspected) exposure to covid-19: Secondary | ICD-10-CM | POA: Diagnosis not present

## 2021-10-01 DIAGNOSIS — J449 Chronic obstructive pulmonary disease, unspecified: Secondary | ICD-10-CM | POA: Diagnosis not present

## 2021-10-01 DIAGNOSIS — Z79899 Other long term (current) drug therapy: Secondary | ICD-10-CM | POA: Diagnosis not present

## 2021-10-01 DIAGNOSIS — Z950 Presence of cardiac pacemaker: Secondary | ICD-10-CM | POA: Diagnosis not present

## 2021-10-01 DIAGNOSIS — M19012 Primary osteoarthritis, left shoulder: Secondary | ICD-10-CM | POA: Diagnosis not present

## 2021-10-01 DIAGNOSIS — I509 Heart failure, unspecified: Secondary | ICD-10-CM | POA: Diagnosis not present

## 2021-10-01 DIAGNOSIS — I2510 Atherosclerotic heart disease of native coronary artery without angina pectoris: Secondary | ICD-10-CM | POA: Diagnosis not present

## 2021-10-01 DIAGNOSIS — Z96612 Presence of left artificial shoulder joint: Secondary | ICD-10-CM | POA: Diagnosis not present

## 2021-10-01 DIAGNOSIS — Z96652 Presence of left artificial knee joint: Secondary | ICD-10-CM | POA: Diagnosis not present

## 2021-10-01 DIAGNOSIS — E119 Type 2 diabetes mellitus without complications: Secondary | ICD-10-CM | POA: Diagnosis not present

## 2021-10-01 LAB — CBC
HCT: 40.2 % (ref 39.0–52.0)
Hemoglobin: 13 g/dL (ref 13.0–17.0)
MCH: 33.2 pg (ref 26.0–34.0)
MCHC: 32.3 g/dL (ref 30.0–36.0)
MCV: 102.8 fL — ABNORMAL HIGH (ref 80.0–100.0)
Platelets: 159 10*3/uL (ref 150–400)
RBC: 3.91 MIL/uL — ABNORMAL LOW (ref 4.22–5.81)
RDW: 14.1 % (ref 11.5–15.5)
WBC: 11.9 10*3/uL — ABNORMAL HIGH (ref 4.0–10.5)
nRBC: 0 % (ref 0.0–0.2)

## 2021-10-01 LAB — BASIC METABOLIC PANEL WITH GFR
Anion gap: 6 (ref 5–15)
BUN: 21 mg/dL (ref 8–23)
CO2: 27 mmol/L (ref 22–32)
Calcium: 8.6 mg/dL — ABNORMAL LOW (ref 8.9–10.3)
Chloride: 100 mmol/L (ref 98–111)
Creatinine, Ser: 0.89 mg/dL (ref 0.61–1.24)
GFR, Estimated: >60 mL/min
Glucose, Bld: 139 mg/dL — ABNORMAL HIGH (ref 70–99)
Potassium: 5 mmol/L (ref 3.5–5.1)
Sodium: 133 mmol/L — ABNORMAL LOW (ref 135–145)

## 2021-10-01 MED ORDER — OXYCODONE HCL 5 MG PO TABS: ORAL_TABLET | ORAL | 0 refills | Status: AC

## 2021-10-01 MED ORDER — ACETAMINOPHEN 500 MG PO TABS: 1000.0000 mg | ORAL_TABLET | Freq: Three times a day (TID) | ORAL | 0 refills | Status: AC

## 2021-10-01 MED ORDER — BUPIVACAINE LIPOSOME 1.3 % IJ SUSP
INTRAMUSCULAR | Status: DC | PRN
  Administered 2021-09-30: 10 mL via PERINEURAL

## 2021-10-01 MED ORDER — CYCLOBENZAPRINE HCL 10 MG PO TABS: 10.0000 mg | ORAL_TABLET | Freq: Three times a day (TID) | ORAL | 0 refills | Status: AC | PRN

## 2021-10-01 MED ORDER — BUPIVACAINE HCL (PF) 0.5 % IJ SOLN
INTRAMUSCULAR | Status: DC | PRN
  Administered 2021-09-30: 15 mL via PERINEURAL

## 2021-10-01 NOTE — Plan of Care (Signed)
Plan of care reviewed and discussed with the patient. 

## 2021-10-01 NOTE — Addendum Note (Signed)
Addendum  created 10/01/21 1434 by Barnet Glasgow, MD   Child order released for a procedure order, Clinical Note Signed, Intraprocedure Blocks edited, Intraprocedure Meds edited, SmartForm saved

## 2021-10-01 NOTE — Progress Notes (Signed)
   ORTHOPAEDIC PROGRESS NOTE  s/p Procedure(s): REVERSE SHOULDER ARTHROPLASTY on 09/30/2021 with Dr. Griffin Basil  SUBJECTIVE: Reports minimal pain about the operative arm. Nerve block still working. No chest pain. No SOB. No nausea/vomiting. No other complaints.  OBJECTIVE: PE: General: resting in hospital bed, NAD LUE: Dressing CDI and sling well fitting,  full and painless ROM throughout hand with DPC of 0.  Axillary nerve sensation/motor altered in setting of block and unable to be fully tested.  Distal motor and sensory altered in setting of block.   Vitals:   10/01/21 0628 10/01/21 0907  BP: 114/81 105/60  Pulse: 80 95  Resp: 18 16  Temp:  97.8 F (36.6 C)  SpO2: 99% 94%     ASSESSMENT: Willie Pineda is a 75 y.o. male doing well postoperatively. POD#1  PLAN: Weightbearing: NWB LUE Insicional and dressing care: Reinforce dressings as needed Orthopedic device(s):  Sling Showering: Post-op day #2 VTE prophylaxis: Resume Xarelto Pain control: PRN pain medications, preferring oral medications Follow - up plan: 1 week in office Contact information: Dr. [name], Noemi Chapel PA-C, After hours and holidays please check [email or web address] for group call information for [facility]  Plan for discharge home with his wife today.   Noemi Chapel, PA-C 10/01/2021

## 2021-10-01 NOTE — Discharge Summary (Signed)
Patient ID: Willie Pineda MRN: [id number] DOB/AGE: 10-Feb-1946 75 y.o.  Admit date: 09/30/2021 Discharge date: 10/01/2021  Admission Diagnoses: Left shoulder infection  Discharge Diagnoses:  Active Problems:   Status post reverse total replacement of left shoulder   Past Medical History:  Diagnosis Date   AICD (automatic cardioverter/defibrillator) present    Arthritis    "hands primarily" (05/05/2016)   Atrial fibrillation (HCC)    Back pain    age related   CHF (congestive heart failure) ([place])    no problemes since 0354   Complication of anesthesia    Bronchial spasms   COPD (chronic obstructive pulmonary disease) (HCC)    inhalers as needed   Coronary artery disease    Depression    takes Zoloft daily   Diabetic peripheral neuropathy ([place]) 01/03/2019   Dysrhythmia    A Fib-takes Xarelto daily   Enlarged prostate    benign   GERD (gastroesophageal reflux disease)    takes Omeprazole daily   Gout    History of blood clots    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)   Hyperlipidemia    takes Atorvastatin daily   Hypertension    takes Metoprolol,HCTZ,and Losartan daily.Takes Clonidine if needed   Hyperuricemia 07/14/2021   Nocturia    Peripheral neuropathy    takes Gabapentin daily   Peripheral vascular disease (HCC)    legs blockage    Pneumonia ?2015   Presence of permanent cardiac pacemaker    Restless leg    Sleep apnea    uses Bipap   Stroke [facility]) 2012   denies residual on 05/05/2016   Procedures Performed:  - Left revision reverse total Shoulder Arthroplasty from hemiarthroplasty - Left shoulder removal of hardware with synovectomy  Discharged Condition: good  Hospital Course: Patient brought to [facility] for scheduled procedure.  He tolerated procedure well.  He was kept for monitoring overnight for pain control, IV antibiotics, and medical monitoring postop. He was found to be stable for DC home the morning after surgery.   Patient was instructed on specific activity restrictions and all questions were answered.  Consults: None  Significant Diagnostic Studies: No additional pertinent studies  Treatments: Surgery  Discharge Exam: General: resting in hospital bed, NAD Cardiac: regular rate Pulmonary: no increased work of breathing GI: non-tender LUE: Dressing CDI and sling well fitting,  full and painless ROM throughout hand with DPC of 0.  Axillary nerve sensation/motor altered in setting of block and unable to be fully tested.  Distal motor and sensory altered in setting of block.  Disposition: Discharge disposition: 01-Home or Self Care       Discharge Instructions     Call MD for:  redness, tenderness, or signs of infection (pain, swelling, redness, odor or green/yellow discharge around incision site)   Complete by: As directed    Call MD for:  severe uncontrolled pain   Complete by: As directed    Call MD for:  temperature >100.4   Complete by: As directed    Diet - low sodium heart healthy   Complete by: As directed       Allergies as of 10/01/2021       Reactions   Penicillins Anaphylaxis   Reports an anaphylactic reaction when he was 13 to IM penicillin.  Has tolerated multiple cephalosporins in the past.  Tolerated Cephalosporin Date: 04/30/21.  Tolerated Cephalosporin Date: 09/30/21.   Alfalfa    Burning in sinus  Dymista [azelastine-fluticasone] Other (See Comments)   Burning sensation    Flonase [fluticasone Propionate] Other (See Comments)   burning   Hydrocodone Other (See Comments)   SWEATING   Warfarin And Related Other (See Comments)   Pt states it makes his INR critical levels        Medication List     STOP taking these medications    oxyCODONE-acetaminophen 5-325 MG tablet Commonly known as: PERCOCET/ROXICET       TAKE these medications    Accu-Chek Guide test strip Generic drug: glucose blood TEST DAILY AS DIRECTED   Accu-Chek Softclix Lancets  lancets daily.   acetaminophen 500 MG tablet Commonly known as: TYLENOL Take 2 tablets (1,000 mg total) by mouth every 8 (eight) hours for 14 days. What changed:  when to take this reasons to take this   allopurinol 100 MG tablet Commonly known as: ZYLOPRIM Take 2 tablets (200 mg total) by mouth 2 (two) times daily.   ARTIFICIAL TEARS PF OP Place 1 drop into both eyes 2 (two) times daily as needed (dry eyes).   atorvastatin 20 MG tablet Commonly known as: LIPITOR Take 20 mg by mouth every other day.   cloNIDine 0.1 MG tablet Commonly known as: CATAPRES Take 0.1 mg by mouth daily as needed (if pt if bp  > 160).   colchicine 0.6 MG tablet Take 1 tablet (0.6 mg total) by mouth daily. As needed for gout pain.   cyclobenzaprine 10 MG tablet Commonly known as: FLEXERIL Take 1 tablet (10 mg total) by mouth 3 (three) times daily as needed for muscle spasms. What changed: when to take this   Entresto 24-26 MG Generic drug: sacubitril-valsartan Take 1 tablet by mouth 2 (two) times daily.   gabapentin 300 MG capsule Commonly known as: NEURONTIN Take 900 mg by mouth 2 (two) times daily.   metolazone 2.5 MG tablet Commonly known as: ZAROXOLYN Take 2.5 mg by mouth daily as needed (fluid retention).   metoprolol succinate 50 MG 24 hr tablet Commonly known as: TOPROL-XL Take 50 mg by mouth in the morning. Take with or immediately following a meal.   minocycline 100 MG capsule Commonly known as: Minocin Take 1 capsule (100 mg total) by mouth 2 (two) times daily.   nitroGLYCERIN 0.2 mg/hr patch Commonly known as: NITRODUR - Dosed in mg/24 hr Place 1 patch (0.2 mg total) onto the skin daily.   omeprazole 20 MG capsule Commonly known as: PRILOSEC Take 20 mg by mouth daily.   oxyCODONE 5 MG immediate release tablet Commonly known as: Oxy IR/ROXICODONE Take 1-2 pills every 6 hrs as needed for severe pain, no more than 6 per day   Ozempic (1 MG/DOSE) 2 MG/1.5ML Sopn Generic  drug: Semaglutide (1 MG/DOSE) Inject 1 mg into the skin once a week.   potassium chloride 10 MEQ CR capsule Commonly known as: MICRO-K Take 10 mEq by mouth in the morning.   rivaroxaban 20 MG Tabs tablet Commonly known as: XARELTO Take 20 mg by mouth in the morning.   sertraline 100 MG tablet Commonly known as: ZOLOFT Take 100 mg by mouth in the morning.   spironolactone 25 MG tablet Commonly known as: ALDACTONE Take 25 mg by mouth in the morning.   torsemide 100 MG tablet Commonly known as: DEMADEX Take 100 mg by mouth in the morning.   Vitamin D 50 MCG (2000 UT) tablet Take 2,000 Units by mouth daily.        Noemi Chapel, PA-C  10/01/2021    

## 2021-10-01 NOTE — Anesthesia Procedure Notes (Addendum)
Anesthesia Regional Block: Interscalene brachial plexus block   Pre-Anesthetic Checklist: , timeout performed,  Correct Patient, Correct Site, Correct Laterality,  Correct Procedure, Correct Position, site marked,  Risks and benefits discussed,  Surgical consent,  Pre-op evaluation,  At surgeon's request and post-op pain management  Laterality: Upper and Left  Prep: Maximum Sterile Barrier Precautions used, chloraprep       Needles:  Injection technique: Single-shot  Needle Type: Echogenic Needle     Needle Length: 5cm  Needle Gauge: 21     Additional Needles:   Procedures:,,,, ultrasound used (permanent image in chart),,    Narrative:  Start time: 09/30/2021 9:05 AM End time: 09/30/2021 9:13 AM Injection made incrementally with aspirations every 5 mL.  Performed by: Personally  Anesthesiologist: Barnet Glasgow, MD  Additional Notes: Block assessed prior to procedure. Patient tolerated procedure well.

## 2021-10-01 NOTE — Discharge Instructions (Signed)
[name] MD, MPH Noemi Chapel, PA-C [place] [address] [phone] (tel)   [phone] (fax)   [place] may leave the operative dressing in place until your follow-up appointment. KEEP THE INCISIONS CLEAN AND DRY. There may be a small amount of fluid/bleeding leaking at the surgical site. This is normal after surgery.  If it fills with liquid or blood please call us immediately to change it for you. Use the provided ice machine or Ice packs as often as possible for the first 3-4 days, then as needed for pain relief.   Keep a layer of cloth or a shirt between your skin and the cooling unit to prevent frost bite as it can get very cold.  SHOWERING: - You may shower on Post-Op Day #2.  - The dressing is water resistant but do not scrub it as it may start to peel up.   - You may remove the sling for showering, but keep a water resistant pillow under the arm to keep both the  elbow and shoulder away from the body (mimicking the abduction sling).  - Gently pat the area dry.  - Do not soak the shoulder in water. Do not go swimming in the pool or ocean until your sutures are removed. - KEEP THE INCISIONS CLEAN AND DRY.  EXERCISES Wear the sling at all times You may remove the sling for showering, but keep the arm across the chest or in a secondary sling.    Accidental/Purposeful External Rotation and shoulder flexion (reaching behind you) is to be avoided at all costs for the first month. Do not lift anything heavier than 1 pound until we discuss it further in clinic.  REGIONAL ANESTHESIA (NERVE BLOCKS) The anesthesia team may have performed a nerve block for you if safe in the setting of your care.  This is a great tool used to minimize pain.  Typically the block may start wearing off overnight but the long acting medicine may last for 3-4 days.  The nerve block wearing off can  be a challenging period but please utilize your as needed pain medications to try and manage this period.    POST-OP MEDICATIONS- Multimodal approach to pain control In general your pain will be controlled with a combination of substances.  Prescriptions unless otherwise discussed are electronically sent to your pharmacy.  This is a carefully made plan we use to minimize narcotic use.     Acetaminophen - Non-narcotic pain medicine taken on a scheduled basis  Oxycodone - This is a strong narcotic, to be used only on an "as needed" basis for severe pain. Flexeril - this is a muscle relaxer, take as needed for muscle spasms   FOLLOW-UP If you develop a Fever (>101.5), Redness or Drainage from the surgical incision site, please call our office to arrange for an evaluation. Please call the office to schedule a follow-up appointment for a wound check, 7-10 days post-operatively.  IF YOU HAVE ANY QUESTIONS, PLEASE FEEL FREE TO CALL OUR OFFICE.  HELPFUL INFORMATION  If you had a block, it will wear off between 8-24 hrs postop typically.  This is period when your pain may go from nearly zero to the pain you would have had post-op without the block.  This is an abrupt transition but nothing dangerous is happening.  You may take an extra dose of narcotic when this happens.  Your arm will be  in a sling following surgery. You will be in this sling for the next 4 weeks.  I will let you know the exact duration at your follow-up visit.  You may be more comfortable sleeping in a semi-seated position the first few nights following surgery.  Keep a pillow propped under the elbow and forearm for comfort.  If you have a recliner type of chair it might be beneficial.  If not that is fine too, but it would be helpful to sleep propped up with pillows behind your operated shoulder as well under your elbow and forearm.  This will reduce pulling on the suture lines.  When dressing, put your operative arm in the sleeve  first.  When getting undressed, take your operative arm out last.  Loose fitting, button-down shirts are recommended.  In most states it is against the law to drive while your arm is in a sling. And certainly against the law to drive while taking narcotics.  You may return to work/school in the next couple of days when you feel up to it. Desk work and typing in the sling is fine.  We suggest you use the pain medication the first night prior to going to bed, in order to ease any pain when the anesthesia wears off. You should avoid taking pain medications on an empty stomach as it will make you nauseous.  Do not drink alcoholic beverages or take illicit drugs when taking pain medications.  Pain medication may make you constipated.  Below are a few solutions to try in this order: Decrease the amount of pain medication if you aren't having pain. Drink lots of decaffeinated fluids. Drink prune juice and/or each dried prunes  If the first 3 don't work start with additional solutions Take Colace - an over-the-counter stool softener Take Senokot - an over-the-counter laxative Take Miralax - a stronger over-the-counter laxative   Dental Antibiotics:  In most cases prophylactic antibiotics for Dental procdeures after total joint surgery are not necessary.  Exceptions are as follows:  1. History of prior total joint infection  2. Severely immunocompromised (Organ Transplant, cancer chemotherapy, Rheumatoid biologic meds such as [place])  3. Poorly controlled diabetes (A1C &gt; 8.0, blood glucose over 200)  If you have one of these conditions, contact your surgeon for an antibiotic prescription, prior to your dental procedure.   For more information including helpful videos and documents visit our website:   [email or web address]

## 2021-10-05 ENCOUNTER — Telehealth: Payer: Self-pay | Admitting: Orthopedic Surgery

## 2021-10-05 NOTE — Telephone Encounter (Signed)
Received call from patient, needs records faxed to [facility]. I advised pt needs to sign records release form. I emailed to pt wife email per his request [email or web address]

## 2021-10-06 ENCOUNTER — Telehealth: Payer: Self-pay | Admitting: Orthopedic Surgery

## 2021-10-06 NOTE — Telephone Encounter (Signed)
[facility]ology called and needs demographics faxed and insurance info. FAX number [phone]  CB [phone] ext [phone]

## 2021-10-07 NOTE — Telephone Encounter (Signed)
I see that you were working on this and did not want to send without knowing release was signed.

## 2021-10-07 NOTE — Telephone Encounter (Signed)
IC, lmvm advised we did not refer there. Advised we faxed records per patients request&auth. I advised should contact patient regarding whom referred him there ,left pts ph number for her to contact patient.

## 2021-10-08 ENCOUNTER — Other Ambulatory Visit: Payer: Self-pay | Admitting: Orthopaedic Surgery

## 2021-10-08 DIAGNOSIS — M25511 Pain in right shoulder: Secondary | ICD-10-CM | POA: Diagnosis not present

## 2021-10-08 DIAGNOSIS — S46001A Unspecified injury of muscle(s) and tendon(s) of the rotator cuff of right shoulder, initial encounter: Secondary | ICD-10-CM

## 2021-10-08 DIAGNOSIS — M19012 Primary osteoarthritis, left shoulder: Secondary | ICD-10-CM | POA: Diagnosis not present

## 2021-10-14 ENCOUNTER — Encounter (HOSPITAL_COMMUNITY): Payer: Self-pay | Admitting: Orthopaedic Surgery

## 2021-10-21 LAB — AEROBIC/ANAEROBIC CULTURE W GRAM STAIN (SURGICAL/DEEP WOUND)
Culture: NO GROWTH
Culture: NO GROWTH

## 2021-10-26 DIAGNOSIS — G4733 Obstructive sleep apnea (adult) (pediatric): Secondary | ICD-10-CM | POA: Diagnosis not present

## 2021-10-26 DIAGNOSIS — I428 Other cardiomyopathies: Secondary | ICD-10-CM | POA: Diagnosis not present

## 2021-10-26 DIAGNOSIS — I482 Chronic atrial fibrillation, unspecified: Secondary | ICD-10-CM | POA: Diagnosis not present

## 2021-10-26 DIAGNOSIS — I4510 Unspecified right bundle-branch block: Secondary | ICD-10-CM | POA: Diagnosis not present

## 2021-10-26 DIAGNOSIS — I5022 Chronic systolic (congestive) heart failure: Secondary | ICD-10-CM | POA: Diagnosis not present

## 2021-10-26 DIAGNOSIS — Z8673 Personal history of transient ischemic attack (TIA), and cerebral infarction without residual deficits: Secondary | ICD-10-CM | POA: Diagnosis not present

## 2021-10-26 DIAGNOSIS — I8392 Asymptomatic varicose veins of left lower extremity: Secondary | ICD-10-CM | POA: Diagnosis not present

## 2021-10-26 DIAGNOSIS — Z9989 Dependence on other enabling machines and devices: Secondary | ICD-10-CM | POA: Diagnosis not present

## 2021-10-26 DIAGNOSIS — I10 Essential (primary) hypertension: Secondary | ICD-10-CM | POA: Diagnosis not present

## 2021-10-27 DIAGNOSIS — M19012 Primary osteoarthritis, left shoulder: Secondary | ICD-10-CM | POA: Diagnosis not present

## 2021-10-27 DIAGNOSIS — G5712 Meralgia paresthetica, left lower limb: Secondary | ICD-10-CM | POA: Diagnosis not present

## 2021-10-28 ENCOUNTER — Encounter: Payer: Self-pay | Admitting: Rehabilitative and Restorative Service Providers"

## 2021-10-28 ENCOUNTER — Other Ambulatory Visit: Payer: Self-pay

## 2021-10-28 ENCOUNTER — Ambulatory Visit
Admission: RE | Admit: 2021-10-28 | Discharge: 2021-10-28 | Disposition: A | Discharge status: Home / Self Care | Payer: Medicare Other | Source: Ambulatory Visit | Attending: Orthopaedic Surgery | Admitting: Orthopaedic Surgery

## 2021-10-28 ENCOUNTER — Ambulatory Visit (INDEPENDENT_AMBULATORY_CARE_PROVIDER_SITE_OTHER): Payer: Medicare Other | Admitting: Rehabilitative and Restorative Service Providers"

## 2021-10-28 DIAGNOSIS — R293 Abnormal posture: Secondary | ICD-10-CM | POA: Diagnosis not present

## 2021-10-28 DIAGNOSIS — G8929 Other chronic pain: Secondary | ICD-10-CM

## 2021-10-28 DIAGNOSIS — R29898 Other symptoms and signs involving the musculoskeletal system: Secondary | ICD-10-CM

## 2021-10-28 DIAGNOSIS — S46001A Unspecified injury of muscle(s) and tendon(s) of the rotator cuff of right shoulder, initial encounter: Secondary | ICD-10-CM

## 2021-10-28 DIAGNOSIS — M6281 Muscle weakness (generalized): Secondary | ICD-10-CM | POA: Diagnosis not present

## 2021-10-28 DIAGNOSIS — M25512 Pain in left shoulder: Secondary | ICD-10-CM

## 2021-10-28 NOTE — Patient Instructions (Signed)
Access Code: [email or web address] URL: [email or web address] Date: 10/28/2021 Prepared by: Gillermo Murdoch  Exercises Seated Scapular Retraction - 2 x daily - 7 x weekly - 1-2 sets - 10 reps - 10 sec hold Circular Shoulder Pendulum with Table Support - 3-4 x daily - 7 x weekly - 1 sets - 20-30 reps Seated Shoulder Flexion Towel Slide at Table Top Full Range of Motion - 2 x daily - 7 x weekly - 1 sets - 5-10 reps - 10sec hold

## 2021-10-28 NOTE — Therapy (Signed)
[facility] [place] [place], [name], [address] Phone: [phone]   Fax:  [phone]  Physical Therapy Evaluation  Patient Details  Name: Willie Pineda MRN: [id number] Date of Birth: Nov 08, 1946 Referring Provider (PT): Dr [name]   Encounter Date: 10/28/2021   PT End of Session - 10/28/21 1246     Visit Number 1    Number of Visits 24    Date for PT Re-Evaluation 01/20/22    PT Start Time 1055    PT Stop Time 1130    PT Time Calculation (min) 35 min    Activity Tolerance Patient tolerated treatment well             Past Medical History:  Diagnosis Date   AICD (automatic cardioverter/defibrillator) present    Arthritis    "hands primarily" (05/05/2016)   Atrial fibrillation ([place])    Back pain    age related   CHF (congestive heart failure) ([place])    no problemes since 0160   Complication of anesthesia    Bronchial spasms   COPD (chronic obstructive pulmonary disease) ([place])    inhalers as needed   Coronary artery disease    Depression    takes Zoloft daily   Diabetic peripheral neuropathy ([place]) 01/03/2019   Dysrhythmia    A Fib-takes Xarelto daily   Enlarged prostate    benign   GERD (gastroesophageal reflux disease)    takes Omeprazole daily   Gout    History of blood clots    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)   Hyperlipidemia    takes Atorvastatin daily   Hypertension    takes Metoprolol,HCTZ,and Losartan daily.Takes Clonidine if needed   Hyperuricemia 07/14/2021   Nocturia    Peripheral neuropathy    takes Gabapentin daily   Peripheral vascular disease (HCC)    legs blockage    Pneumonia ?2015   Presence of permanent cardiac pacemaker    Restless leg    Sleep apnea    uses Bipap   Stroke [facility]) 2012   denies residual on 05/05/2016    Past Surgical History:  Procedure Laterality Date   AMPUTATION Left 04/03/2021   Procedure: LEFT 2ND RAY AMPUTATION;  Surgeon:  Newt Minion, MD;  Location: [place];  Service: Orthopedics;  Laterality: Left;   CARPAL TUNNEL RELEASE     CATARACT EXTRACTION W/ INTRAOCULAR LENS  IMPLANT, BILATERAL Bilateral    COLONOSCOPY WITH PROPOFOL N/A 03/09/2017   Procedure: COLONOSCOPY WITH PROPOFOL;  Surgeon: Wonda Horner, MD;  Location: [facility]COPY;  Service: Endoscopy;  Laterality: N/A;   COLONOSCOPY WITH PROPOFOL N/A 04/03/2020   Procedure: COLONOSCOPY WITH PROPOFOL;  Surgeon: Wonda Horner, MD;  [facility];  Service: Endoscopy;  Laterality: N/A;   I & D KNEE WITH POLY EXCHANGE Right 05/04/2016   IRRIGATION AND DEBRIDEMENT Right KNEE WITH UNICOMPARTMENTAL POLY EXCHANGE   I & D KNEE WITH POLY EXCHANGE Right 05/04/2016   Procedure: IRRIGATION AND DEBRIDEMENT Right KNEE WITH UNICOMPARTMENTAL POLY EXCHANGE;  Surgeon: Renette Butters, MD;  Location: [place];  Service: Orthopedics;  Laterality: Right;   JOINT REPLACEMENT     LAPAROSCOPIC CHOLECYSTECTOMY  1995   PARTIAL KNEE ARTHROPLASTY Right 04/06/2016   Procedure: UNICOMPARTMENTAL RIGHT KNEE;  Surgeon: Renette Butters, MD;  Location: [place];  Service: Orthopedics;  Laterality: Right;   POLYPECTOMY  04/03/2020   Procedure: POLYPECTOMY;  Surgeon: Wonda Horner, MD;  [facility];  Service: Endoscopy;;   REVERSE SHOULDER ARTHROPLASTY Left 04/29/2021   Procedure: Revision antibiotic spacer placement. Incision and drainage.;  Surgeon: [name], MD;  [facility];  Service: Orthopedics;  Laterality: Left;   REVERSE SHOULDER ARTHROPLASTY Left 09/30/2021   Procedure: REVERSE SHOULDER ARTHROPLASTY;  Surgeon: [name], MD;  [facility];  Service: Orthopedics;  Laterality: Left;   STRABISMUS SURGERY Bilateral 06/10/2017   Procedure: REPAIR STRABISMUS BILATERAL;  Surgeon: Everitt Amber, MD;  Location: [place];  Service: Ophthalmology;  Laterality: Bilateral;   TONSILLECTOMY AND ADENOIDECTOMY     TOTAL KNEE ARTHROPLASTY Left 2008    TOTAL SHOULDER ARTHROPLASTY Left 03/04/2021   Procedure: Total Shoulder Arthroplasty;  Surgeon: [name], MD;  [facility];  Service: Orthopedics;  Laterality: Left;   ULNAR NERVE TRANSPOSITION Bilateral 1989    There were no vitals filed for this visit.    Subjective Assessment - 10/28/21 1056     Subjective Patient underwent Lt TSR 03/04/21 and started therapy 4/22 and developed infection in the Lt shoulder. He has undergone two surgeries for I & D and treated for infection. He underwent Lt reverse TSA 09/30/21. He is doing well post op. He saw surgeon yesterday and does not return to MD for 3 months. patient is to work on ROM until he returns to MD    Pertinent History 2007; 2016 TKR with revision; Lt second toe amputation 4/22; arthritis; AODM; HTN; CHF; COPD; sleep apnea; pace maker    Patient Stated Goals gain ROM and active movement Lt shoulder    Currently in Pain? No/denies    Pain Score 0-No pain    Pain Location Shoulder    Pain Orientation Left    Pain Type Surgical pain;Chronic pain    Pain Onset More than a month ago    Pain Frequency Intermittent    Aggravating Factors  moving arm in wrong way    Pain Relieving Factors avoid moving arm                [facility] PT Assessment - 10/28/21 0001       Assessment   Medical Diagnosis Lt reverse TSA    Referring Provider (PT) Dr [name]    Onset Date/Surgical Date 09/30/21   earlier surgery 03/04/21; pain for yrs   Hand Dominance Right    Next MD Visit 3 months    Prior Therapy here for knee and Lt shoulder      Precautions   Precautions Shoulder    Type of Shoulder Precautions per protocol      Balance Screen   Has the patient fallen in the past 6 months No    Has the patient had a decrease in activity level because of a fear of falling?  No    Is the patient reluctant to leave their home because of a fear of falling?  No      Home Ecologist residence    Living  Arrangements Spouse/significant other      Prior Function   Level of Independence Independent    Vocation Retired    Glass blower/designer and ranching    Leisure walking 3 x/wk ~ 20 min; gardening; some help around the house      Observation/Other Assessments   Focus on Therapeutic Outcomes (FOTO)  30      Sensation   Additional Comments neuropathy in bilat feet      Posture/Postural Control  Posture Comments head forward; shoulders rounded and elevated; shoulder anterior in orientation; scapulae abducted and rotated along the thoracic wall; increased thoracic kyphosis      AROM   Right Shoulder Extension 52 Degrees    Right Shoulder Flexion 141 Degrees    Right Shoulder ABduction 132 Degrees    Right Shoulder Internal Rotation --   thumb T12   Right Shoulder External Rotation 35 Degrees   elbow at side bent 90 deg   Left Shoulder Extension 16 Degrees    Left Shoulder Flexion 77 Degrees    Left Shoulder ABduction 84 Degrees    Left Shoulder Internal Rotation --   hand to lateral thigh   Left Shoulder External Rotation 26 Degrees   elbow at side bent 90 deg     Palpation   Palpation comment muscular tightness Lt shoulder girdle - pecs, upper trp, leveator, teres, deltoid                        Objective measurements completed on examination: See above findings.       [facility] Adult PT Treatment/Exercise - 10/28/21 0001       Shoulder Exercises: Standing   Retraction AROM;Strengthening;Both;10 reps   10 sec hold   Retraction Limitations scapular retraction pulling scapulae down and back squeezing around noodle      Shoulder Exercises: ROM/Strengthening   Pendulum 15-20 reps CW/CCW      Shoulder Exercises: Stretch   Table Stretch - Flexion 5 reps;10 seconds    Table Stretch -Flexion Limitations Lt UE supported                     PT Education - 10/28/21 1128     Education Details HEP; POC    Person(s) Educated Patient    Methods  Explanation;Demonstration;Tactile cues;Verbal cues;Handout    Comprehension Verbalized understanding;Returned demonstration;Verbal cues required;Tactile cues required              PT Short Term Goals - 10/28/21 1256       PT SHORT TERM GOAL #1   Title Initiate HEP    Time 6    Period Weeks    Status New    Target Date 12/09/21      PT SHORT TERM GOAL #2   Title Increase AROM Lt shoulder to 90 deg flexion; 30 deg ER arm at side    Time 6    Period Weeks    Status New    Target Date 12/09/21      PT SHORT TERM GOAL #3   Title Patient compliant with post op protocol for reerse TSA    Time 6    Period Weeks    Status New    Target Date 12/09/21      PT SHORT TERM GOAL #4   Title Patient to demonstrate improved posture and alignment with posterior shoulder girdle engaged    Time 6    Period Weeks    Status New    Target Date 12/09/21               PT Long Term Goals - 10/28/21 1259       PT LONG TERM GOAL #1   Title Improve posture and alignment with patient to demonstrate improved upright posture with posterior shoulder girdle engaged    Time 12    Period Weeks    Status New    Target Date 01/20/22  PT LONG TERM GOAL #2   Title PROM to [facility] Lt shoulder as tissue condition indicates per MD protcol    Time 12    Period Weeks    Status New    Target Date 01/20/22      PT LONG TERM GOAL #3   Title AROM to strengthening Lt UE as indicated and MD approves working to improve strength in Lt shoulder girdle to compensate for absent rotator cuff strength    Time 12    Period Weeks    Status New    Target Date 01/20/22      PT LONG TERM GOAL #4   Title Independent in HEP    Time 12    Period Weeks    Status New    Target Date 01/20/22      PT LONG TERM GOAL #5   Title Improve functional limitation score to 56    Time 12    Period Weeks    Status New    Target Date 01/20/22                    Plan - 10/28/21 1247     Clinical  Impression Statement Willie Pineda presents s/p Lt reverse total shoulder replacement 37/34/28 after a complicated history of Lt shoulder problems. He had initial Lt shoulder replacement 03/04/21 with subsequent infection requiring removal of shoulder prosthesis and 2 surgeries for I&D followed by IV treatment for infection. He underwent second shoulder replacement 09/30/21 and has been in a sling until yesterday. Patient has medical problems including AODM; COPD; CHF; pacemaker; gout; amputation of Lt second toe. He presents with poor posture and alignment; limited AROM, strength, function Lt shoulder; decreased ADL's and functional use of Lt UE; pain on an intermittent basis. Patient will benefit from PT to address problems identified.    Personal Factors and Comorbidities Age;Fitness;Comorbidity 1;Comorbidity 2;Comorbidity 3+    Comorbidities AODM; CHF; pacemaker; COPD    Examination-Activity Limitations Bathing;Bed Mobility;Reach Overhead;Caring for Others;Sleep;Dressing;Lift    Stability/Clinical Decision Making Stable/Uncomplicated    Clinical Decision Making Low    Rehab Potential Good    PT Frequency 2x / week    PT Duration 12 weeks    PT Treatment/Interventions ADLs/Self Care Home Management;Aquatic Therapy;Cryotherapy;Electrical Stimulation;Iontophoresis 4mg /ml Dexamethasone;Moist Heat;Ultrasound;Functional mobility training;Therapeutic activities;Therapeutic exercise;Neuromuscular re-education;Patient/family education;Manual techniques;Passive range of motion;Dry needling;Taping;Vasopneumatic Device    PT Next Visit Plan review HEP; add ER to </= 30 deg; PROM and stretching Lt shoulder; pulley per protocol; manua lwork and modalities as indicated    PT Home Exercise Plan [email or web address]    Consulted and Agree with Plan of Care Patient             Patient will benefit from skilled therapeutic intervention in order to improve the following deficits and impairments:  Decreased range of motion, Impaired  UE functional use, Decreased activity tolerance, Pain, Hypomobility, Impaired flexibility, Improper body mechanics, Decreased mobility, Decreased strength, Impaired sensation, Postural dysfunction, Increased edema  Visit Diagnosis: Chronic left shoulder pain  Muscle weakness (generalized)  Abnormal posture  Other symptoms and signs involving the musculoskeletal system     Problem List Patient Active Problem List   Diagnosis Date Noted   Status post reverse total replacement of left shoulder 09/30/2021   Hyperuricemia 07/14/2021   Septic joint of left shoulder region ([place]) 04/29/2021   Osteomyelitis of second toe of left foot (HCC)    Penicillin allergy    Chronic osteomyelitis (HCC)    Diabetic foot  infection [facility])    S/p total knee replacement, bilateral    Septic arthritis of shoulder, left ([place]) 03/03/2021   Atrial fibrillation, chronic ([place]) 03/03/2021   Chronic anticoagulation 03/03/2021   DM (diabetes mellitus), type 2 with neurological complications ([place]) 70/48/8891   Essential hypertension 03/03/2021   Hypokalemia 03/03/2021   Prolonged QT interval 03/03/2021   COPD (chronic obstructive pulmonary disease) ([place]) 03/03/2021   Chronic CHF (congestive heart failure) ([place]) 03/03/2021   Diabetic peripheral neuropathy ([place]) 01/03/2019   Obesity 06/01/2016   Wound dehiscence 05/04/2016   Knee osteoarthritis 04/06/2016    Willie Pineda Nilda Simmer, PT, MPH  10/28/2021, 1:01 PM  [facility] [place], [name], [address] Phone: [phone]   Fax:  [phone]  Name: Willie Pineda MRN: [id number] Date of Birth: 06/27/1946

## 2021-11-02 ENCOUNTER — Other Ambulatory Visit (HOSPITAL_COMMUNITY): Payer: Self-pay | Admitting: Orthopaedic Surgery

## 2021-11-02 DIAGNOSIS — S46001A Unspecified injury of muscle(s) and tendon(s) of the rotator cuff of right shoulder, initial encounter: Secondary | ICD-10-CM

## 2021-11-03 ENCOUNTER — Ambulatory Visit: Payer: Self-pay

## 2021-11-03 ENCOUNTER — Encounter: Payer: Self-pay | Admitting: Orthopedic Surgery

## 2021-11-03 ENCOUNTER — Ambulatory Visit (INDEPENDENT_AMBULATORY_CARE_PROVIDER_SITE_OTHER): Payer: Medicare Other | Admitting: Orthopedic Surgery

## 2021-11-03 ENCOUNTER — Other Ambulatory Visit: Payer: Self-pay

## 2021-11-03 DIAGNOSIS — M14672 Charcot's joint, left ankle and foot: Secondary | ICD-10-CM

## 2021-11-03 DIAGNOSIS — M79672 Pain in left foot: Secondary | ICD-10-CM

## 2021-11-03 NOTE — Progress Notes (Signed)
Office Visit Note   Patient: Willie Pineda           Date of Birth: 12-Dec-1945           MRN: [id number] Visit Date: 11/03/2021              Requested by: Josetta Huddle, MD [address] [place],  [place] [address] PCP: Josetta Huddle, MD  Chief Complaint  Patient presents with   Left Foot - Pain      HPI: Patient is a 75 year old gentleman with a history of gout currently on allopurinol he was started on colchicine 5 weeks ago but he states that this caused some diarrhea and did not change the swelling.  Patient states that he has noticed increased swelling in the left foot states that it feels tight but denies any pain.  Patient has chronic venous insufficiency of both lower extremities.  Assessment & Plan: Visit Diagnoses:  1. Pain in left foot   2. Charcot's joint of foot, left     Plan: Patient seems to have developed a Charcot collapse through the Lisfranc complex with underlying chronic gouty changes.  Patient will continue wearing his compression stockings and will continue with the stiff soled postoperative shoe.  He is traveling to [place] and will follow-up in approximately 5 weeks.  Repeat three-view radiographs of the left foot at follow-up.  Anticipate we will need to proceed with open reduction internal fixation and fusion across the Lisfranc complex.  Follow-Up Instructions: Return in about 4 weeks (around 12/01/2021).   Ortho Exam  Patient is alert, oriented, no adenopathy, well-dressed, normal affect, normal respiratory effort. Examination patient has palpable pulses.  There is no redness or cellulitis there is progressive pes planus with Charcot collapse through the left midfoot.  No tenderness to palpation no redness.  Imaging: XR Foot Complete Left  Result Date: 11/03/2021 Three-view radiographs of the left foot shows chronic periarticular lytic changes consistent with gout across the Lisfranc complex.  Radiographs today show progressive  Charcot collapse across the Lisfranc complex.  No images are attached to the encounter.  Labs: Lab Results  Component Value Date   HGBA1C 6.5 (H) 08/20/2021   HGBA1C 6.5 (H) 04/27/2021   HGBA1C 7.0 (H) 05/04/2016   ESRSEDRATE 25 08/13/2021   ESRSEDRATE 32 (H) 04/30/2021   ESRSEDRATE 51 (H) 04/15/2021   CRP 39 (H) 08/13/2021   CRP 1.1 (H) 04/30/2021   CRP 42.3 (H) 04/15/2021   LABURIC 6.6 09/11/2021   LABURIC 8.5 (H) 07/03/2021   LABURIC 9.5 (H) 04/01/2021   REPTSTATUS 10/21/2021 FINAL 09/30/2021   REPTSTATUS 10/21/2021 FINAL 09/30/2021   GRAMSTAIN  09/30/2021    FEW WBC PRESENT,BOTH PMN AND MONONUCLEAR NO ORGANISMS SEEN    GRAMSTAIN  09/30/2021    FEW WBC PRESENT, PREDOMINANTLY PMN NO ORGANISMS SEEN    CULT  09/30/2021    No growth aerobically or anaerobically. Performed at [facility], [place] [address] [place], [place] [address]    CULT  09/30/2021    No growth aerobically or anaerobically. Performed at [facility], [place] [address] [place], [place] [address]      Lab Results  Component Value Date   ALBUMIN 3.7 03/25/2016    No results found for: MG No results found for: VD25OH  No results found for: PREALBUMIN CBC EXTENDED Latest Ref Rng & Units 10/01/2021 09/30/2021 09/22/2021  WBC 4.0 - 10.5 K/uL 11.9(H) - 9.5  RBC 4.22 - 5.81  MIL/uL 3.91(L) - 4.73  HGB 13.0 - 17.0 g/dL 13.0 14.0 15.8  HCT 39.0 - 52.0 % 40.2 43.2 46.8  PLT 150 - 400 K/uL 159 - 229  NEUTROABS 1.4 - 7.0 x10E3/uL - - -  LYMPHSABS 0.7 - 3.1 x10E3/uL - - -     There is no height or weight on file to calculate BMI.  Orders:  Orders Placed This Encounter  Procedures   XR Foot Complete Left   No orders of the defined types were placed in this encounter.    Procedures: No procedures performed  Clinical Data: No additional findings.  ROS:  All other systems negative, except as noted in the HPI. Review of Systems  Objective: Vital Signs: There were no vitals taken  for this visit.  Specialty Comments:  No specialty comments available.  PMFS History: Patient Active Problem List   Diagnosis Date Noted   Status post reverse total replacement of left shoulder 09/30/2021   Hyperuricemia 07/14/2021   Septic joint of left shoulder region ([place]) 04/29/2021   Osteomyelitis of second toe of left foot (HCC)    Penicillin allergy    Chronic osteomyelitis ([place])    Diabetic foot infection ([place])    S/p total knee replacement, bilateral    Septic arthritis of shoulder, left ([place]) 03/03/2021   Atrial fibrillation, chronic ([place]) 03/03/2021   Chronic anticoagulation 03/03/2021   DM (diabetes mellitus), type 2 with neurological complications ([place]) 85/01/7740   Essential hypertension 03/03/2021   Hypokalemia 03/03/2021   Prolonged QT interval 03/03/2021   COPD (chronic obstructive pulmonary disease) ([place]) 03/03/2021   Chronic CHF (congestive heart failure) ([place]) 03/03/2021   Diabetic peripheral neuropathy ([place]) 01/03/2019   Obesity 06/01/2016   Wound dehiscence 05/04/2016   Knee osteoarthritis 04/06/2016   Past Medical History:  Diagnosis Date   AICD (automatic cardioverter/defibrillator) present    Arthritis    "hands primarily" (05/05/2016)   Atrial fibrillation (HCC)    Back pain    age related   CHF (congestive heart failure) ([place])    no problemes since 2878   Complication of anesthesia    Bronchial spasms   COPD (chronic obstructive pulmonary disease) (HCC)    inhalers as needed   Coronary artery disease    Depression    takes Zoloft daily   Diabetic peripheral neuropathy ([place]) 01/03/2019   Dysrhythmia    A Fib-takes Xarelto daily   Enlarged prostate    benign   GERD (gastroesophageal reflux disease)    takes Omeprazole daily   Gout    History of blood clots    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)   Hyperlipidemia    takes Atorvastatin daily   Hypertension    takes Metoprolol,HCTZ,and Losartan daily.Takes Clonidine if needed    Hyperuricemia 07/14/2021   Nocturia    Peripheral neuropathy    takes Gabapentin daily   Peripheral vascular disease (HCC)    legs blockage    Pneumonia ?2015   Presence of permanent cardiac pacemaker    Restless leg    Sleep apnea    uses Bipap   Stroke [facility]) 2012   denies residual on 05/05/2016    Family History  Problem Relation Age of Onset   CVA Mother    Liver cancer Father     Past Surgical History:  Procedure Laterality Date   AMPUTATION Left 04/03/2021   Procedure: LEFT 2ND RAY AMPUTATION;  Surgeon: Newt Minion, MD;  Location: [place];  Service:  Orthopedics;  Laterality: Left;   CARPAL TUNNEL RELEASE     CATARACT EXTRACTION W/ INTRAOCULAR LENS  IMPLANT, BILATERAL Bilateral    COLONOSCOPY WITH PROPOFOL N/A 03/09/2017   Procedure: COLONOSCOPY WITH PROPOFOL;  Surgeon: Wonda Horner, MD;  Location[facility] ENDOSCOPY;  Service: Endoscopy;  Laterality: N/A;   COLONOSCOPY WITH PROPOFOL N/A 04/03/2020   Procedure: COLONOSCOPY WITH PROPOFOL;  Surgeon: Wonda Horner, MD;  [facility];  Service: Endoscopy;  Laterality: N/A;   I & D KNEE WITH POLY EXCHANGE Right 05/04/2016   IRRIGATION AND DEBRIDEMENT Right KNEE WITH UNICOMPARTMENTAL POLY EXCHANGE   I & D KNEE WITH POLY EXCHANGE Right 05/04/2016   Procedure: IRRIGATION AND DEBRIDEMENT Right KNEE WITH UNICOMPARTMENTAL POLY EXCHANGE;  Surgeon: Renette Butters, MD;  Location: [place];  Service: Orthopedics;  Laterality: Right;   JOINT REPLACEMENT     LAPAROSCOPIC CHOLECYSTECTOMY  1995   PARTIAL KNEE ARTHROPLASTY Right 04/06/2016   Procedure: UNICOMPARTMENTAL RIGHT KNEE;  Surgeon: Renette Butters, MD;  Location: [place];  Service: Orthopedics;  Laterality: Right;   POLYPECTOMY  04/03/2020   Procedure: POLYPECTOMY;  Surgeon: Wonda Horner, MD;  [facility];  Service: Endoscopy;;   REVERSE SHOULDER ARTHROPLASTY Left 04/29/2021   Procedure: Revision antibiotic spacer placement. Incision and drainage.;  Surgeon: [name], MD;  [facility];  Service: Orthopedics;  Laterality: Left;   REVERSE SHOULDER ARTHROPLASTY Left 09/30/2021   Procedure: REVERSE SHOULDER ARTHROPLASTY;  Surgeon: [name], MD;  [facility];  Service: Orthopedics;  Laterality: Left;   STRABISMUS SURGERY Bilateral 06/10/2017   Procedure: REPAIR STRABISMUS BILATERAL;  Surgeon: Everitt Amber, MD;  Location: [place];  Service: Ophthalmology;  Laterality: Bilateral;   TONSILLECTOMY AND ADENOIDECTOMY     TOTAL KNEE ARTHROPLASTY Left 2008   TOTAL SHOULDER ARTHROPLASTY Left 03/04/2021   Procedure: Total Shoulder Arthroplasty;  Surgeon: [name], MD;  [facility];  Service: Orthopedics;  Laterality: Left;   ULNAR NERVE TRANSPOSITION Bilateral 1989   Social History   Occupational History   Occupation: Retired Therapist, sports  Tobacco Use   Smoking status: Former    Packs/day: 0.50    Years: 40.00    Pack years: 20.00    Types: Cigarettes    Quit date: 07/15/2011    Years since quitting: 10.3   Smokeless tobacco: Former    Types: Nurse, children's Use: Never used  Substance and Sexual Activity   Alcohol use: Yes    Comment: 2 beers a month   Drug use: No   Sexual activity: Yes

## 2021-11-04 ENCOUNTER — Encounter: Payer: Medicare Other | Admitting: Rehabilitative and Restorative Service Providers"

## 2021-11-04 DIAGNOSIS — I83819 Varicose veins of unspecified lower extremities with pain: Secondary | ICD-10-CM | POA: Diagnosis not present

## 2021-11-04 DIAGNOSIS — I10 Essential (primary) hypertension: Secondary | ICD-10-CM | POA: Diagnosis not present

## 2021-11-05 ENCOUNTER — Ambulatory Visit (HOSPITAL_COMMUNITY)
Admission: RE | Admit: 2021-11-05 | Discharge: 2021-11-05 | Disposition: A | Discharge status: Home / Self Care | Payer: Medicare Other | Source: Ambulatory Visit | Attending: Orthopaedic Surgery | Admitting: Orthopaedic Surgery

## 2021-11-05 ENCOUNTER — Ambulatory Visit (HOSPITAL_COMMUNITY)
Admission: RE | Admit: 2021-11-05 | Discharge: 2021-11-05 | Disposition: A | Discharge status: Home / Self Care | Payer: Medicare Other | Source: Ambulatory Visit | Attending: Student | Admitting: Student

## 2021-11-05 ENCOUNTER — Other Ambulatory Visit: Payer: Self-pay

## 2021-11-05 DIAGNOSIS — Z950 Presence of cardiac pacemaker: Secondary | ICD-10-CM | POA: Insufficient documentation

## 2021-11-05 DIAGNOSIS — I517 Cardiomegaly: Secondary | ICD-10-CM | POA: Diagnosis not present

## 2021-11-05 DIAGNOSIS — M25511 Pain in right shoulder: Secondary | ICD-10-CM | POA: Diagnosis not present

## 2021-11-05 DIAGNOSIS — S46001A Unspecified injury of muscle(s) and tendon(s) of the rotator cuff of right shoulder, initial encounter: Secondary | ICD-10-CM | POA: Insufficient documentation

## 2021-11-05 NOTE — Progress Notes (Signed)
Informed of MRI for today.   Device system confirmed to be MRI conditional, with implant date > 6 weeks ago, and no evidence of abandoned or epicardial leads in review of most recent CXR Interrogation from today reviewed, pt is currently  AF-BiV pacing at ~78 bpm   Change device settings for MRI to VOO at  15  bpm greater than current HR, not to exceed VOO 95 bpm  Tachy-therapies to off if applicable.  Program device back to pre-MRI settings after completion of exam.  Shirley Friar, PA-C  11/05/2021 1:13 PM

## 2021-11-05 NOTE — Progress Notes (Signed)
[facility] here today at [facility] for [facility] right shoulder without contrast. Patient has medtronic BIV pacer. CLE sent. Orders received VOO 95. Will re-program once scan is completed.

## 2021-11-06 ENCOUNTER — Ambulatory Visit (INDEPENDENT_AMBULATORY_CARE_PROVIDER_SITE_OTHER): Payer: Medicare Other | Admitting: Rehabilitative and Restorative Service Providers"

## 2021-11-06 ENCOUNTER — Encounter: Payer: Self-pay | Admitting: Rehabilitative and Restorative Service Providers"

## 2021-11-06 DIAGNOSIS — R293 Abnormal posture: Secondary | ICD-10-CM

## 2021-11-06 DIAGNOSIS — M6281 Muscle weakness (generalized): Secondary | ICD-10-CM | POA: Diagnosis not present

## 2021-11-06 DIAGNOSIS — G8929 Other chronic pain: Secondary | ICD-10-CM | POA: Diagnosis not present

## 2021-11-06 DIAGNOSIS — R29898 Other symptoms and signs involving the musculoskeletal system: Secondary | ICD-10-CM | POA: Diagnosis not present

## 2021-11-06 DIAGNOSIS — M25512 Pain in left shoulder: Secondary | ICD-10-CM

## 2021-11-06 NOTE — Patient Instructions (Addendum)
Access Code: [email or web address] URL: [email or web address] Date: 11/06/2021 Prepared by: Gillermo Murdoch  Exercises Seated Scapular Retraction - 2 x daily - 7 x weekly - 1-2 sets - 10 reps - 10 sec hold Circular Shoulder Pendulum with Table Support - 3-4 x daily - 7 x weekly - 1 sets - 20-30 reps Seated Shoulder Flexion Towel Slide at Table Top Full Range of Motion - 2 x daily - 7 x weekly - 1 sets - 5-10 reps - 10sec hold Seated Shoulder External Rotation AAROM with Cane and Hand in Neutral - 2 x daily - 7 x weekly - 1 sets - 5-10 reps - 5-10 sec hold Isometric Shoulder Abduction at Wall - 2 x daily - 7 x weekly - 1 sets - 5-10 reps - 5 sec hold Isometric Shoulder External Rotation at Wall - 2 x daily - 7 x weekly - 1 sets - 5-10 reps - 5 sec hold Isometric Shoulder Flexion at Wall - 2 x daily - 7 x weekly - 1 sets - 5-10 reps - 5 sec hold Supine Elbow Extension Stretch in Supination - 2 x daily - 7 x weekly - 1 sets - 3 reps - 20-30 sec hold

## 2021-11-06 NOTE — Therapy (Signed)
[facility] [address], [name], [address] Phone: [phone]   Fax:  [phone]  Physical Therapy Treatment  Patient Details  Name: Willie Pineda MRN: [id number] Date of Birth: 1946/06/19 Referring Provider (PT): Dr [name]   Encounter Date: 11/06/2021   PT End of Session - 11/06/21 0929     Visit Number 2    Number of Visits 24    Date for PT Re-Evaluation 01/20/22    PT Start Time 0925    PT Stop Time 1008    PT Time Calculation (min) 43 min    Activity Tolerance Patient tolerated treatment well             Past Medical History:  Diagnosis Date   AICD (automatic cardioverter/defibrillator) present    Arthritis    "hands primarily" (05/05/2016)   Atrial fibrillation ([place])    Back pain    age related   CHF (congestive heart failure) ([place])    no problemes since 5366   Complication of anesthesia    Bronchial spasms   COPD (chronic obstructive pulmonary disease) ([place])    inhalers as needed   Coronary artery disease    Depression    takes Zoloft daily   Diabetic peripheral neuropathy ([place]) 01/03/2019   Dysrhythmia    A Fib-takes Xarelto daily   Enlarged prostate    benign   GERD (gastroesophageal reflux disease)    takes Omeprazole daily   Gout    History of blood clots    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)   Hyperlipidemia    takes Atorvastatin daily   Hypertension    takes Metoprolol,HCTZ,and Losartan daily.Takes Clonidine if needed   Hyperuricemia 07/14/2021   Nocturia    Peripheral neuropathy    takes Gabapentin daily   Peripheral vascular disease (HCC)    legs blockage    Pneumonia ?2015   Presence of permanent cardiac pacemaker    Restless leg    Sleep apnea    uses Bipap   Stroke [facility]) 2012   denies residual on 05/05/2016    Past Surgical History:  Procedure Laterality Date   AMPUTATION Left 04/03/2021   Procedure: LEFT 2ND RAY AMPUTATION;  Surgeon: Newt Minion, MD;  Location: [place];  Service: Orthopedics;  Laterality: Left;   CARPAL TUNNEL RELEASE     CATARACT EXTRACTION W/ INTRAOCULAR LENS  IMPLANT, BILATERAL Bilateral    COLONOSCOPY WITH PROPOFOL N/A 03/09/2017   Procedure: COLONOSCOPY WITH PROPOFOL;  Surgeon: Wonda Horner, MD;  [facility] ENDOSCOPY;  Service: Endoscopy;  Laterality: N/A;   COLONOSCOPY WITH PROPOFOL N/A 04/03/2020   Procedure: COLONOSCOPY WITH PROPOFOL;  Surgeon: Wonda Horner, MD;  Location: [facility] ENDOSCOPY;  Service: Endoscopy;  Laterality: N/A;   I & D KNEE WITH POLY EXCHANGE Right 05/04/2016   IRRIGATION AND DEBRIDEMENT Right KNEE WITH UNICOMPARTMENTAL POLY EXCHANGE   I & D KNEE WITH POLY EXCHANGE Right 05/04/2016   Procedure: IRRIGATION AND DEBRIDEMENT Right KNEE WITH UNICOMPARTMENTAL POLY EXCHANGE;  Surgeon: Renette Butters, MD;  Location: [place];  Service: Orthopedics;  Laterality: Right;   JOINT REPLACEMENT     LAPAROSCOPIC CHOLECYSTECTOMY  1995   PARTIAL KNEE ARTHROPLASTY Right 04/06/2016   Procedure: UNICOMPARTMENTAL RIGHT KNEE;  Surgeon: Renette Butters, MD;  Location: [place];  Service: Orthopedics;  Laterality: Right;   POLYPECTOMY  04/03/2020   Procedure: POLYPECTOMY;  Surgeon: Wonda Horner, MD;  Location: [facility] ENDOSCOPY;  Service: Endoscopy;;   REVERSE SHOULDER ARTHROPLASTY Left 04/29/2021   Procedure: Revision antibiotic spacer placement. Incision and drainage.;  Surgeon: [name], MD;  Location: [facility];  Service: Orthopedics;  Laterality: Left;   REVERSE SHOULDER ARTHROPLASTY Left 09/30/2021   Procedure: REVERSE SHOULDER ARTHROPLASTY;  Surgeon: [name], MD;  Location: [facility];  Service: Orthopedics;  Laterality: Left;   STRABISMUS SURGERY Bilateral 06/10/2017   Procedure: REPAIR STRABISMUS BILATERAL;  Surgeon: Everitt Amber, MD;  Location: [place];  Service: Ophthalmology;  Laterality: Bilateral;   TONSILLECTOMY AND ADENOIDECTOMY     TOTAL KNEE ARTHROPLASTY Left 2008   TOTAL  SHOULDER ARTHROPLASTY Left 03/04/2021   Procedure: Total Shoulder Arthroplasty;  Surgeon: [name], MD;  Location: [facility];  Service: Orthopedics;  Laterality: Left;   ULNAR NERVE TRANSPOSITION Bilateral 1989    There were no vitals filed for this visit.   Subjective Assessment - 11/06/21 0931     Subjective Patient reports that the shoulder is doing well. No pain in the shoulder. He is having pain in the Lt toes - had amputation of second toe. Now treating    Currently in Pain? No/denies    Pain Score 0-No pain    Pain Location Shoulder    Pain Orientation Left    Pain Type Surgical pain;Chronic pain    Pain Onset More than a month ago    Pain Frequency Intermittent                [facility] PT Assessment - 11/06/21 0001       Assessment   Medical Diagnosis Lt reverse TSA    Referring Provider (PT) Dr [name]    Onset Date/Surgical Date 09/30/21   earlier surgery 03/04/21; pain for yrs   Hand Dominance Right    Next MD Visit 3 months    Prior Therapy here for knee and Lt shoulder      PROM   Left Shoulder Flexion 133 Degrees   no tissue resistance - no pain   Left Shoulder ABduction 114 Degrees   in scapular plane   Left Shoulder External Rotation 30 Degrees   in scapular plane - to tissue limits no pain                          [facility] Adult PT Treatment/Exercise - 11/06/21 0001       Elbow Exercises   Elbow Flexion AAROM;Left;5 reps;Supine   passive stretch for Lt elbow extension     Shoulder Exercises: Isometric Strengthening   Flexion 5X5"    External Rotation 5X5"    ABduction 5X5"      Shoulder Exercises: Stretch   External Rotation Stretch 5 reps;10 seconds   standing with cane - elbow at 90 deg neutral at side   Table Stretch - Flexion 5 reps;10 seconds    Table Stretch -Flexion Limitations Lt UE supported      Manual Therapy   Soft tissue mobilization Lt shoulder girdle - pecs; upper trap; leveator; deltoid; biceps; forearm     Myofascial Release anterior chest    Passive ROM Lt shoulder flexion; abduction in scapution; ER in scapular plane within limits of protocol    Manual Traction gentle manual traction through long arm    Other Manual Therapy scar massage anterior shoulder                     PT Education -  11/06/21 8786     Education Details HEP    Person(s) Educated Patient    Methods Explanation;Demonstration;Tactile cues;Verbal cues;Handout    Comprehension Verbalized understanding;Returned demonstration;Verbal cues required;Tactile cues required              PT Short Term Goals - 10/28/21 1256       PT SHORT TERM GOAL #1   Title Initiate HEP    Time 6    Period Weeks    Status New    Target Date 12/09/21      PT SHORT TERM GOAL #2   Title Increase AROM Lt shoulder to 90 deg flexion; 30 deg ER arm at side    Time 6    Period Weeks    Status New    Target Date 12/09/21      PT SHORT TERM GOAL #3   Title Patient compliant with post op protocol for reerse TSA    Time 6    Period Weeks    Status New    Target Date 12/09/21      PT SHORT TERM GOAL #4   Title Patient to demonstrate improved posture and alignment with posterior shoulder girdle engaged    Time 6    Period Weeks    Status New    Target Date 12/09/21               PT Long Term Goals - 10/28/21 1259       PT LONG TERM GOAL #1   Title Improve posture and alignment with patient to demonstrate improved upright posture with posterior shoulder girdle engaged    Time 12    Period Weeks    Status New    Target Date 01/20/22      PT LONG TERM GOAL #2   Title PROM to [facility] Lt shoulder as tissue condition indicates per MD protcol    Time 12    Period Weeks    Status New    Target Date 01/20/22      PT LONG TERM GOAL #3   Title AROM to strengthening Lt UE as indicated and MD approves working to improve strength in Lt shoulder girdle to compensate for absent rotator cuff strength    Time 12     Period Weeks    Status New    Target Date 01/20/22      PT LONG TERM GOAL #4   Title Independent in HEP    Time 12    Period Weeks    Status New    Target Date 01/20/22      PT LONG TERM GOAL #5   Title Improve functional limitation score to 56    Time 12    Period Weeks    Status New    Target Date 01/20/22                   Plan - 11/06/21 0933     Clinical Impression Statement Out of sling for the past week and doing well. Added isometric exercise; PROM/stretching/scar massage; progressing rehab per MD protocol. Good PROM.    Rehab Potential Good    PT Frequency 2x / week    PT Duration 12 weeks    PT Treatment/Interventions ADLs/Self Care Home Management;Aquatic Therapy;Cryotherapy;Electrical Stimulation;Iontophoresis 4mg /ml Dexamethasone;Moist Heat;Ultrasound;Functional mobility training;Therapeutic activities;Therapeutic exercise;Neuromuscular re-education;Patient/family education;Manual techniques;Passive range of motion;Dry needling;Taping;Vasopneumatic Device    PT Next Visit Plan review HEP; add ER to </= 30 deg; PROM and stretching Lt shoulder;  pulley per protocol; manual work and modalities as indicated    PT Home Exercise Plan [email or web address]    Consulted and Agree with Plan of Care Patient             Patient will benefit from skilled therapeutic intervention in order to improve the following deficits and impairments:     Visit Diagnosis: Chronic left shoulder pain  Muscle weakness (generalized)  Abnormal posture  Other symptoms and signs involving the musculoskeletal system     Problem List Patient Active Problem List   Diagnosis Date Noted   Status post reverse total replacement of left shoulder 09/30/2021   Hyperuricemia 07/14/2021   Septic joint of left shoulder region ([place]) 04/29/2021   Osteomyelitis of second toe of left foot (HCC)    Penicillin allergy    Chronic osteomyelitis ([place])    Diabetic foot infection ([place])    S/p total knee  replacement, bilateral    Septic arthritis of shoulder, left ([place]) 03/03/2021   Atrial fibrillation, chronic ([place]) 03/03/2021   Chronic anticoagulation 03/03/2021   DM (diabetes mellitus), type 2 with neurological complications ([place]) 03/50/0938   Essential hypertension 03/03/2021   Hypokalemia 03/03/2021   Prolonged QT interval 03/03/2021   COPD (chronic obstructive pulmonary disease) ([place]) 03/03/2021   Chronic CHF (congestive heart failure) ([place]) 03/03/2021   Diabetic peripheral neuropathy ([place]) 01/03/2019   Obesity 06/01/2016   Wound dehiscence 05/04/2016   Knee osteoarthritis 04/06/2016    Lauris Keepers Nilda Simmer, PT, MPH  11/06/2021, 10:10 AM  [facility] [place], [name], [address] Phone: [phone]   Fax:  [phone]  Name: Willie Pineda MRN: [id number] Date of Birth: 1946/10/12

## 2021-11-11 ENCOUNTER — Encounter: Payer: Medicare Other | Admitting: Rehabilitative and Restorative Service Providers"

## 2021-11-11 ENCOUNTER — Telehealth: Payer: Self-pay

## 2021-11-11 NOTE — Telephone Encounter (Signed)
Patient stated that he has had recent radiology tests that show his infection has cleared.  Also shared that he has had some discoloration in his lower legs that another provider stated could be related to the minocycline.  He would like to know if he can discontinue that medication.

## 2021-11-12 NOTE — Telephone Encounter (Signed)
Spoke with patient's wife, she says the patient has a stomach virus and will be unable to make an appointment tomorrow. Accepts appointment for 12/12.  Beryle Flock, RN

## 2021-11-16 ENCOUNTER — Encounter: Payer: Self-pay | Admitting: Infectious Disease

## 2021-11-16 ENCOUNTER — Other Ambulatory Visit: Payer: Self-pay

## 2021-11-16 ENCOUNTER — Ambulatory Visit (INDEPENDENT_AMBULATORY_CARE_PROVIDER_SITE_OTHER): Payer: Medicare Other | Admitting: Infectious Disease

## 2021-11-16 VITALS — BP 101/66 | HR 80 | Temp 97.6°F | Wt 284.0 lb

## 2021-11-16 DIAGNOSIS — M869 Osteomyelitis, unspecified: Secondary | ICD-10-CM | POA: Diagnosis not present

## 2021-11-16 DIAGNOSIS — L270 Generalized skin eruption due to drugs and medicaments taken internally: Secondary | ICD-10-CM | POA: Diagnosis not present

## 2021-11-16 DIAGNOSIS — L089 Local infection of the skin and subcutaneous tissue, unspecified: Secondary | ICD-10-CM | POA: Diagnosis not present

## 2021-11-16 DIAGNOSIS — Z96612 Presence of left artificial shoulder joint: Secondary | ICD-10-CM | POA: Diagnosis not present

## 2021-11-16 DIAGNOSIS — E11610 Type 2 diabetes mellitus with diabetic neuropathic arthropathy: Secondary | ICD-10-CM

## 2021-11-16 DIAGNOSIS — M8660 Other chronic osteomyelitis, unspecified site: Secondary | ICD-10-CM | POA: Diagnosis not present

## 2021-11-16 DIAGNOSIS — I482 Chronic atrial fibrillation, unspecified: Secondary | ICD-10-CM | POA: Diagnosis not present

## 2021-11-16 DIAGNOSIS — M009 Pyogenic arthritis, unspecified: Secondary | ICD-10-CM | POA: Diagnosis not present

## 2021-11-16 DIAGNOSIS — E11628 Type 2 diabetes mellitus with other skin complications: Secondary | ICD-10-CM

## 2021-11-16 HISTORY — DX: Generalized skin eruption due to drugs and medicaments taken internally: L27.0

## 2021-11-16 MED ORDER — CEFADROXIL 500 MG PO CAPS
1000.0000 mg | ORAL_CAPSULE | Freq: Two times a day (BID) | ORAL | 11 refills | Status: AC
Start: 2021-11-16 — End: ?

## 2021-11-16 NOTE — Progress Notes (Signed)
Subjective:   Chief Complaint: tetracycline induced rash, left Charot food pain,    Patient ID: Willie Pineda, male    DOB: [birth year], 75 y.o.   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 Baxter Flattery and Dr. Linus Salmons in 2017), who is being prepared for possible shoulder replacement surgery with Dr. Griffin Basil when a CT and MRI scan were done which showed a septic shoulder and abscess.  MRI specifically showed a large complex joint effusion with enhancing synovitis with high-grade partial-thickness cartilage loss of the glenohumeral joint subchondral bone marrow edema in the inferior aspect of the glenoid likely to osteomyelitis, tear of the supraspinatus infraspinatus tendons complete tear of the subscapularis tendon and dislocation long head of biceps tendon and severe synovitis of the long head of the biceps tendon which also thought to possibly represent intra-articular infection.   Of note he had injured this shoulder when he had fallen from his horse a year ago in December and struck the ground quite hard but without clear breaks in the skin but with extensive bruising.  He also told me that when he was younger he had a horse pull him through backwards and injured the same shoulder.   The patient had been seen by February 19 2021  where he had developed a soft tissue infection which was debrided in the podiatry office.  He was given 10 days of doxycycline which he was on during admission to the hospital.   Note this was also a site of prior injury where horse and stepped on his toe.   Taken to the operating room by Dr. Griffin Basil where he underwent left shoulder I&D with debridement of abscess and also removal of bone from an  area of osteomyelitis involving the humeral head incision debridement of open septic shoulder synovectomy I&D of the [facility] joint with resection of part of the distal clavicle where there was osteomyelitis and placement of a left shoulder antibiotic spacer hemiarthroplasty.   Multiple cultures were done including for organisms that are more subacute in presentation such as nontuberculous back mycobacteria and fungi.  He was initially on vancomycin and aztreonam and metronidazole but later discharged on oral doxycycline and levofloxacin he has subsequently grown Propionibacterium acnes on multiple cultures.   We narrowed him to doxycycline.   Shoulder pain had initially improved.Marland Kitchen  His footwas appearing stable but he had not yet seen podiatry.   I obtained plain films which showed bony resorptive change in the distal phalanx of second digit concerning for osteomyelitis.  He also had a regular arthropathy of the TMT joints.   Patient was eval by Meridee Score and underwent left second ray amputation.   No cultures were taken.   He had been doing relatively well though he had some problem with bleeding at the operative site.   He is ollowed very closely by orthopedic surgery.   He then developed significant left-sided shoulder pain that was new in nature before he was experiencing more of a dull pain that he rated about 3 out of 10 in severity exacerbated by movement.   He requested MRI of the shoulder which we ordered.  MRI showed a moderate sized rim-enhancing effusion with thick enhancement posteriorly along the glenohumeral joint, there is also some  enhancement along the humeral stem of the antibiotic spacer consistent with postoperative change versus osteomyelitis which we would formant favored to be the former.   He was taken the operating room by Dr. Griffin Basil.   He performed I&D and did encounter purulent material in the operating room.  This was sent for culture.  He completed resection of  thick synovial capsule with synovectomy.  Capsule bone and purulent and fascia material were debrided antibiotic spacer was removed and the bone in the medullary canal was debrided.  Area was irrigated and local vancomycin powder was placed along with a spacer with vancomycin and gentamicin's powder.  Cultures taken did not yield any organism though there was mention by my partner Dr. Linus Salmons of a gram-negative rod having been seen on gram stain.   Patient was placed on ceftriaxone with plans for 3 weeks of treatment.  His shoulder pain is dramatically improved .  He saw Dr. Sharol Given and sutures were removed.  I double checked with the microbiology lab and there were no gram-negative rod seen on review of the Gram stain when reviewed by the microbiology staff.  Cultures never yielded an organism either.  I had him extend his antibiotics through today so that I could reassess him.  His shoulder pain continued to improve as does his range of motion at the last visit.    He has RBBB, persistent atrial fibrillation and his cardiologist Dimple Nanas is considering biventricular defibrillator versus subcutaneous ICD.   He ultimately was going to undergo reverse shoulder arthroplasty with Dr. Griffin Basil but would like to reduce risk of anesthesiology by optimizing his cardiac function.  I have myself quite comfortable with him undergoing placement of a biventricular pacemaker.   His shoulder infection is under control and I will continue oral abx,  I am not anxious about it spreading to his bloodstream.  I have not seen any evidence of him becoming or having been bacteremic at any point in time.    ICD placement has now been accomplished.  Additionally he is undergone left revision reverse total shoulder arthroplasty from hemiarthroplasty and left shoulder removal of hardware and synovectomy with Dr. Griffin Basil on September 30, 2021.  Patient is continued on doxycycline --> minocycline  He has  developed hyperpigmentation in his lower extremities consistent with a doxycycline induced rash.  He is wondering if he still needs to really truly be on antibiotics or whether his infection is not resolved.  He is also developed painful left foot became that became quite swollen and was also erythematous.  He was seen by Dr. Sharol Given who diagnosed him with Charcot arthropathy.  He is concerned also about why he developed this Charcot arthropathy and if it was in fact a postoperative complication.  He had right shoulder pain and has had an MRI of that shoulder which shows several torn tendons but no evidence of infection    Past Medical History:  Diagnosis Date   AICD (automatic cardioverter/defibrillator) present    Arthritis    "hands primarily" (05/05/2016)   Atrial fibrillation (HCC)    Back pain    age related   CHF (congestive heart failure) ([place])    no problemes since 9390   Complication of anesthesia    Bronchial spasms   COPD (chronic obstructive pulmonary disease) (HCC)    inhalers as needed   Coronary artery disease    Depression    takes Zoloft daily   Diabetic peripheral neuropathy ([place]) 01/03/2019   Dysrhythmia  A Fib-takes Xarelto daily   Enlarged prostate    benign   GERD (gastroesophageal reflux disease)    takes Omeprazole daily   Gout    History of blood clots    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)   Hyperlipidemia    takes Atorvastatin daily   Hypertension    takes Metoprolol,HCTZ,and Losartan daily.Takes Clonidine if needed   Hyperuricemia 07/14/2021   Nocturia    Peripheral neuropathy    takes Gabapentin daily   Peripheral vascular disease (HCC)    legs blockage    Pneumonia ?2015   Presence of permanent cardiac pacemaker    Restless leg    Sleep apnea    uses Bipap   Stroke [facility]) 2012   denies residual on 05/05/2016    Past Surgical History:  Procedure Laterality Date   AMPUTATION Left 04/03/2021   Procedure: LEFT 2ND RAY  AMPUTATION;  Surgeon: Newt Minion, MD;  Location: [place];  Service: Orthopedics;  Laterality: Left;   CARPAL TUNNEL RELEASE     CATARACT EXTRACTION W/ INTRAOCULAR LENS  IMPLANT, BILATERAL Bilateral    COLONOSCOPY WITH PROPOFOL N/A 03/09/2017   Procedure: COLONOSCOPY WITH PROPOFOL;  Surgeon: Wonda Horner, MD;  Location: [facility] ENDOSCOPY;  Service: Endoscopy;  Laterality: N/A;   COLONOSCOPY WITH PROPOFOL N/A 04/03/2020   Procedure: COLONOSCOPY WITH PROPOFOL;  Surgeon: Wonda Horner, MD;  Location: [facility] ENDOSCOPY;  Service: Endoscopy;  Laterality: N/A;   I & D KNEE WITH POLY EXCHANGE Right 05/04/2016   IRRIGATION AND DEBRIDEMENT Right KNEE WITH UNICOMPARTMENTAL POLY EXCHANGE   I & D KNEE WITH POLY EXCHANGE Right 05/04/2016   Procedure: IRRIGATION AND DEBRIDEMENT Right KNEE WITH UNICOMPARTMENTAL POLY EXCHANGE;  Surgeon: Renette Butters, MD;  Location: [place];  Service: Orthopedics;  Laterality: Right;   JOINT REPLACEMENT     LAPAROSCOPIC CHOLECYSTECTOMY  1995   PARTIAL KNEE ARTHROPLASTY Right 04/06/2016   Procedure: UNICOMPARTMENTAL RIGHT KNEE;  Surgeon: Renette Butters, MD;  Location: [place];  Service: Orthopedics;  Laterality: Right;   POLYPECTOMY  04/03/2020   Procedure: POLYPECTOMY;  Surgeon: Wonda Horner, MD;  Location: [facility] ENDOS[facility];  Service: Endoscopy;;   REVERSE SHOULDER ARTHROPLASTY Left 04/29/2021   Procedure: Revision antibiotic spacer placement. Incision and drainage.;  Surgeon: [name], MD;  Location: [facility];  Service: Orthopedics;  Laterality: Left;   REVERSE SHOULDER ARTHROPLASTY Left 09/30/2021   Procedure: REVERSE SHOULDER ARTHROPLASTY;  Surgeon: [name], MD;  Location: [facility];  Service: Orthopedics;  Laterality: Left;   STRABISMUS SURGERY Bilateral 06/10/2017   Procedure: REPAIR STRABISMUS BILATERAL;  Surgeon: Everitt Amber, MD;  Location: [place];  Service: Ophthalmology;  Laterality: Bilateral;   TONSILLECTOMY AND ADENOIDECTOMY     TOTAL KNEE  ARTHROPLASTY Left 2008   TOTAL SHOULDER ARTHROPLASTY Left 03/04/2021   Procedure: Total Shoulder Arthroplasty;  Surgeon: [name], MD;  Location: [facility];  Service: Orthopedics;  Laterality: Left;   ULNAR NERVE TRANSPOSITION Bilateral 1989    Family History  Problem Relation Age of Onset   CVA Mother    Liver cancer Father       Social History   Socioeconomic History   Marital status: Married    Spouse name: Not on file   Number of children: Not on file   Years of education: Not on file   Highest education level: Not on file  Occupational History   Occupation: Retired Therapist, sports  Tobacco Use  Smoking status: Former    Packs/day: 0.50    Years: 40.00    Pack years: 20.00    Types: Cigarettes    Quit date: 07/15/2011    Years since quitting: 10.3   Smokeless tobacco: Former    Types: Nurse, children's Use: Never used  Substance and Sexual Activity   Alcohol use: Yes    Comment: 2 beers a month   Drug use: No   Sexual activity: Yes  Other Topics Concern   Not on file  Social History Narrative   Lives at home with spouse    Caffeine 2 cups of coffee daily    Right handed.    Social Determinants of Health   Financial Resource Strain: Not on file  Food Insecurity: Not on file  Transportation Needs: Not on file  Physical Activity: Not on file  Stress: Not on file  Social Connections: Not on file    Allergies  Allergen Reactions   Penicillins Anaphylaxis    Reports an anaphylactic reaction when he was 13 to IM penicillin.  Has tolerated multiple cephalosporins in the past.   Tolerated Cephalosporin Date: 04/30/21.   Tolerated Cephalosporin Date: 09/30/21.       Alfalfa     Burning in sinus    Dymista [Azelastine-Fluticasone] Other (See Comments)    Burning sensation    Flonase [Fluticasone Propionate] Other (See Comments)    burning   Hydrocodone Other (See Comments)    SWEATING   Warfarin And Related Other (See Comments)    Pt states it makes  his INR critical levels     Current Outpatient Medications:    ACCU-CHEK GUIDE test strip, TEST DAILY AS DIRECTED, Disp: , Rfl:    Accu-Chek Softclix Lancets lancets, daily., Disp: , Rfl:    allopurinol (ZYLOPRIM) 100 MG tablet, Take 2 tablets (200 mg total) by mouth 2 (two) times daily., Disp: 120 tablet, Rfl: 12   atorvastatin (LIPITOR) 20 MG tablet, Take 20 mg by mouth every other day., Disp: , Rfl: 6   Cholecalciferol (VITAMIN D) 50 MCG (2000 UT) tablet, Take 2,000 Units by mouth daily., Disp: , Rfl:    cloNIDine (CATAPRES) 0.1 MG tablet, Take 0.1 mg by mouth daily as needed (if pt if bp  > 160)., Disp: , Rfl:    colchicine 0.6 MG tablet, Take 1 tablet (0.6 mg total) by mouth daily. As needed for gout pain., Disp: 60 tablet, Rfl: 12   cyclobenzaprine (FLEXERIL) 10 MG tablet, Take 1 tablet (10 mg total) by mouth 3 (three) times daily as needed for muscle spasms., Disp: 20 tablet, Rfl: 0   Dextran 70-Hypromellose (ARTIFICIAL TEARS PF OP), Place 1 drop into both eyes 2 (two) times daily as needed (dry eyes)., Disp: , Rfl:    ENTRESTO 24-26 MG, Take 1 tablet by mouth 2 (two) times daily., Disp: , Rfl:    gabapentin (NEURONTIN) 300 MG capsule, Take 900 mg by mouth 2 (two) times daily., Disp: , Rfl:    metolazone (ZAROXOLYN) 2.5 MG tablet, Take 2.5 mg by mouth daily as needed (fluid retention)., Disp: , Rfl:    metoprolol succinate (TOPROL-XL) 50 MG 24 hr tablet, Take 50 mg by mouth in the morning. Take with or immediately following a meal., Disp: , Rfl:    minocycline (MINOCIN) 100 MG capsule, Take 1 capsule (100 mg total) by mouth 2 (two) times daily., Disp: 60 capsule, Rfl: 6   nitroGLYCERIN (NITRODUR - DOSED IN MG/24 HR)  0.2 mg/hr patch, Place 1 patch (0.2 mg total) onto the skin daily., Disp: 30 patch, Rfl: 12   omeprazole (PRILOSEC) 20 MG capsule, Take 20 mg by mouth daily., Disp: , Rfl:    potassium chloride (MICRO-K) 10 MEQ CR capsule, Take 10 mEq by mouth in the morning., Disp: , Rfl:     rivaroxaban (XARELTO) 20 MG TABS tablet, Take 20 mg by mouth in the morning., Disp: , Rfl:    Semaglutide, 1 MG/DOSE, (OZEMPIC, 1 MG/DOSE,) 2 MG/1.5ML SOPN, Inject 1 mg into the skin once a week., Disp: , Rfl:    sertraline (ZOLOFT) 100 MG tablet, Take 100 mg by mouth in the morning., Disp: , Rfl:    spironolactone (ALDACTONE) 25 MG tablet, Take 25 mg by mouth in the morning., Disp: , Rfl:    torsemide (DEMADEX) 100 MG tablet, Take 100 mg by mouth in the morning., Disp: , Rfl:      Review of Systems  Constitutional:  Negative for activity change, appetite change, chills, diaphoresis, fatigue, fever and unexpected weight change.  HENT:  Negative for congestion, rhinorrhea, sinus pressure, sneezing, sore throat and trouble swallowing.   Eyes:  Negative for photophobia and visual disturbance.  Respiratory:  Negative for cough, chest tightness, shortness of breath, wheezing and stridor.   Cardiovascular:  Negative for chest pain, palpitations and leg swelling.  Gastrointestinal:  Negative for abdominal distention, abdominal pain, anal bleeding, blood in stool, constipation, diarrhea, nausea and vomiting.  Genitourinary:  Negative for difficulty urinating, dysuria, flank pain and hematuria.  Musculoskeletal:  Positive for arthralgias, joint swelling and myalgias. Negative for back pain and gait problem.  Skin:  Positive for rash and wound. Negative for color change and pallor.  Neurological:  Negative for dizziness, tremors, weakness and light-headedness.  Hematological:  Negative for adenopathy. Does not bruise/bleed easily.  Psychiatric/Behavioral:  Negative for agitation, behavioral problems, confusion, decreased concentration, dysphoric mood and sleep disturbance.       Objective:   Physical Exam Constitutional:      Appearance: He is well-developed.  HENT:     Head: Normocephalic and atraumatic.  Eyes:     Conjunctiva/sclera: Conjunctivae normal.  Cardiovascular:     Rate and Rhythm:  Normal rate and regular rhythm.  Pulmonary:     Effort: Pulmonary effort is normal. No respiratory distress.     Breath sounds: No wheezing.  Abdominal:     General: There is no distension.     Palpations: Abdomen is soft.  Musculoskeletal:        General: Normal range of motion.     Cervical back: Normal range of motion and neck supple.  Skin:    General: Skin is warm and dry.     Coloration: Skin is not pale.     Findings: Rash present. No erythema.  Neurological:     General: No focal deficit present.     Mental Status: He is alert and oriented to person, place, and time.  Psychiatric:        Mood and Affect: Mood normal.        Behavior: Behavior normal.        Thought Content: Thought content normal.        Judgment: Judgment normal.    Left shoulder is stable  Left foot 06/11/2021:    Left foot 07/14/2021:     Left foot 11/16/2021:      Tetracycline associated hyperpigmented rash 11/16/2021:         Assessment &  Plan:   Left septic with radiographic evidence of osteomyelitis of humerus on imaging  Propionibacterium was isolated previously  He had been on chronic minocycline therapy but now is developed a hyperpigmented rash.  He is eager to be off of antibiotics but I am apprehensive of taking him off prematurely given his multiple surgeries. My preference in these patients is to get to one year of therapy post-surgery and we can do this from his March surgery   Will have her switch him to cefadroxil 2 tablets twice daily.  I will check a sed rate C-reactive protein comprehensive metabolic panel CBC with differential   Diabetic foot infection osteomyelitis now status post ray amputation: Follow-up with Dr. Sharol Given  Charcot arthropathy:   In reading Dr. Jess Barters notes he is stated that he believes the patient has developed a Charcot collapse through the Lisfranc's complex in the context of underlying changes consistent with chronic gout.  Gout: He is  also a bit frustrated by having been get this diagnosis.  I went through my own understanding of how to make the diagnosis that is made based more on what I was taught in medical school and residency.  I understand he has an elevated uric acid check and there are changes on plain films that Dr. Sharol Given believes are consistent with gout.    Persistent atrial fibrillation: now sp   Persistent atrial fibrillation: I am supportive of  Bi-ventricular  device that has been placed  I spent 50  minutes with the patient including than 50% of the time in face to face counseling of the patient regarding all the issues mentioned above, personally reviewing MRI of his right shoulder performed in November along with review of medical records in preparation for the visit and during the visit and in coordination of her care.

## 2021-11-17 ENCOUNTER — Telehealth: Payer: Self-pay

## 2021-11-17 LAB — CBC WITH DIFFERENTIAL/PLATELET
Absolute Monocytes: 773 cells/uL (ref 200–950)
Basophils Absolute: 78 cells/uL (ref 0–200)
Basophils Relative: 0.7 %
Eosinophils Absolute: 101 cells/uL (ref 15–500)
Eosinophils Relative: 0.9 %
HCT: 43.7 % (ref 38.5–50.0)
Hemoglobin: 15.1 g/dL (ref 13.2–17.1)
Lymphs Abs: 2150 cells/uL (ref 850–3900)
MCH: 33.4 pg — ABNORMAL HIGH (ref 27.0–33.0)
MCHC: 34.6 g/dL (ref 32.0–36.0)
MCV: 96.7 fL (ref 80.0–100.0)
MPV: 9.7 fL (ref 7.5–12.5)
Monocytes Relative: 6.9 %
Neutro Abs: 8098 cells/uL — ABNORMAL HIGH (ref 1500–7800)
Neutrophils Relative %: 72.3 %
Platelets: 293 10*3/uL (ref 140–400)
RBC: 4.52 10*6/uL (ref 4.20–5.80)
RDW: 13.4 % (ref 11.0–15.0)
Total Lymphocyte: 19.2 %
WBC: 11.2 10*3/uL — ABNORMAL HIGH (ref 3.8–10.8)

## 2021-11-17 LAB — COMPLETE METABOLIC PANEL WITH GFR
AG Ratio: 1.5 (calc) (ref 1.0–2.5)
ALT: 16 U/L (ref 9–46)
AST: 18 U/L (ref 10–35)
Albumin: 4 g/dL (ref 3.6–5.1)
Alkaline phosphatase (APISO): 122 U/L (ref 35–144)
BUN/Creatinine Ratio: 31 (calc) — ABNORMAL HIGH (ref 6–22)
BUN: 43 mg/dL — ABNORMAL HIGH (ref 7–25)
CO2: 29 mmol/L (ref 20–32)
Calcium: 9.2 mg/dL (ref 8.6–10.3)
Chloride: 100 mmol/L (ref 98–110)
Creat: 1.38 mg/dL — ABNORMAL HIGH (ref 0.70–1.28)
Globulin: 2.7 g/dL (calc) (ref 1.9–3.7)
Glucose, Bld: 126 mg/dL — ABNORMAL HIGH (ref 65–99)
Potassium: 4.8 mmol/L (ref 3.5–5.3)
Sodium: 137 mmol/L (ref 135–146)
Total Bilirubin: 0.6 mg/dL (ref 0.2–1.2)
Total Protein: 6.7 g/dL (ref 6.1–8.1)
eGFR: 53 mL/min/{1.73_m2} — ABNORMAL LOW (ref >=60)

## 2021-11-17 LAB — C-REACTIVE PROTEIN: CRP: 13.4 mg/L — ABNORMAL HIGH (ref <8.0)

## 2021-11-17 LAB — SEDIMENTATION RATE: Sed Rate: 31 mm/h — ABNORMAL HIGH (ref 0–20)

## 2021-11-17 NOTE — Telephone Encounter (Signed)
Message sent to patient via mychart

## 2021-11-17 NOTE — Telephone Encounter (Signed)
-----   Message from Truman Hayward, MD sent at 11/17/2021  8:44 AM EST ----- His renal function is a little bit worse inflammatory markers fairly stable ----- Message ----- From: [place] Adas Lab Results In Sent: 11/16/2021  10:51 PM EST To: Truman Hayward, MD

## 2021-11-19 ENCOUNTER — Other Ambulatory Visit: Payer: Self-pay

## 2021-11-19 ENCOUNTER — Encounter: Payer: Self-pay | Admitting: Rehabilitative and Restorative Service Providers"

## 2021-11-19 ENCOUNTER — Ambulatory Visit: Payer: Medicare Other | Admitting: Rehabilitative and Restorative Service Providers"

## 2021-11-19 DIAGNOSIS — R293 Abnormal posture: Secondary | ICD-10-CM | POA: Diagnosis not present

## 2021-11-19 DIAGNOSIS — M25512 Pain in left shoulder: Secondary | ICD-10-CM | POA: Diagnosis not present

## 2021-11-19 DIAGNOSIS — R29898 Other symptoms and signs involving the musculoskeletal system: Secondary | ICD-10-CM | POA: Diagnosis not present

## 2021-11-19 DIAGNOSIS — G4733 Obstructive sleep apnea (adult) (pediatric): Secondary | ICD-10-CM | POA: Diagnosis not present

## 2021-11-19 DIAGNOSIS — M6281 Muscle weakness (generalized): Secondary | ICD-10-CM

## 2021-11-19 DIAGNOSIS — G8929 Other chronic pain: Secondary | ICD-10-CM | POA: Diagnosis not present

## 2021-11-19 NOTE — Therapy (Signed)
[place] [employer], [name], [address] Phone: [phone]   Fax:  [phone]  Physical Therapy Treatment  Patient Details  Name: Willie Pineda MRN: [id number] Date of Birth: [birth year] Referring Provider (PT): Dr [name]   Encounter Date: 11/19/2021   PT End of Session - 11/19/21 1532     Visit Number 3    Number of Visits 24    Date for PT Re-Evaluation 01/20/22    PT Start Time 1530    PT Stop Time 3474    PT Time Calculation (min) 45 min    Activity Tolerance Patient tolerated treatment well             Past Medical History:  Diagnosis Date   AICD (automatic cardioverter/defibrillator) present    Arthritis    "hands primarily" (05/05/2016)   Atrial fibrillation ([place])    Back pain    age related   CHF (congestive heart failure) ([place])    no problemes since 2595   Complication of anesthesia    Bronchial spasms   COPD (chronic obstructive pulmonary disease) ([place])    inhalers as needed   Coronary artery disease    Depression    takes Zoloft daily   Diabetic peripheral neuropathy ([place]) 01/03/2019   Drug rash 11/16/2021   Dysrhythmia    A Fib-takes Xarelto daily   Enlarged prostate    benign   GERD (gastroesophageal reflux disease)    takes Omeprazole daily   Gout    History of blood clots    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)   Hyperlipidemia    takes Atorvastatin daily   Hypertension    takes Metoprolol,HCTZ,and Losartan daily.Takes Clonidine if needed   Hyperuricemia 07/14/2021   Nocturia    Peripheral neuropathy    takes Gabapentin daily   Peripheral vascular disease (HCC)    legs blockage    Pneumonia ?2015   Presence of permanent cardiac pacemaker    Restless leg    Sleep apnea    uses Bipap   Stroke [facility]) 2012   denies residual on 05/05/2016    Past Surgical History:  Procedure Laterality Date   AMPUTATION Left 04/03/2021   Procedure: LEFT 2ND RAY  AMPUTATION;  Surgeon: Newt Minion, MD;  Location: [place];  Service: Orthopedics;  Laterality: Left;   CARPAL TUNNEL RELEASE     CATARACT EXTRACTION W/ INTRAOCULAR LENS  IMPLANT, BILATERAL Bilateral    COLONOSCOPY WITH PROPOFOL N/A 03/09/2017   Procedure: COLONOSCOPY WITH PROPOFOL;  Surgeon: Wonda Horner, MD;  [facility];  Service: Endoscopy;  Laterality: N/A;   COLONOSCOPY WITH PROPOFOL N/A 04/03/2020   Procedure: COLONOSCOPY WITH PROPOFOL;  Surgeon: Wonda Horner, MD;  [facility];  Service: Endoscopy;  Laterality: N/A;   I & D KNEE WITH POLY EXCHANGE Right 05/04/2016   IRRIGATION AND DEBRIDEMENT Right KNEE WITH UNICOMPARTMENTAL POLY EXCHANGE   I & D KNEE WITH POLY EXCHANGE Right 05/04/2016   Procedure: IRRIGATION AND DEBRIDEMENT Right KNEE WITH UNICOMPARTMENTAL POLY EXCHANGE;  Surgeon: Renette Butters, MD;  Location: [place];  Service: Orthopedics;  Laterality: Right;   JOINT REPLACEMENT     LAPAROSCOPIC CHOLECYSTECTOMY  1995   PARTIAL KNEE ARTHROPLASTY Right 04/06/2016   Procedure: UNICOMPARTMENTAL RIGHT KNEE;  Surgeon: Renette Butters, MD;  Location: [place];  Service: Orthopedics;  Laterality: Right;   POLYPECTOMY  04/03/2020   Procedure: POLYPECTOMY;  Surgeon: Wonda Horner,  MD;  [facility];  Service: Endoscopy;;   REVERSE SHOULDER ARTHROPLASTY Left 04/29/2021   Procedure: Revision antibiotic spacer placement. Incision and drainage.;  Surgeon: [name], MD;  [facility];  Service: Orthopedics;  Laterality: Left;   REVERSE SHOULDER ARTHROPLASTY Left 09/30/2021   Procedure: REVERSE SHOULDER ARTHROPLASTY;  Surgeon: [name], MD;  [facility];  Service: Orthopedics;  Laterality: Left;   STRABISMUS SURGERY Bilateral 06/10/2017   Procedure: REPAIR STRABISMUS BILATERAL;  Surgeon: Everitt Amber, MD;  Location: [place];  Service: Ophthalmology;  Laterality: Bilateral;   TONSILLECTOMY AND ADENOIDECTOMY     TOTAL KNEE  ARTHROPLASTY Left 2008   TOTAL SHOULDER ARTHROPLASTY Left 03/04/2021   Procedure: Total Shoulder Arthroplasty;  Surgeon: [name], MD;  [facility];  Service: Orthopedics;  Laterality: Left;   ULNAR NERVE TRANSPOSITION Bilateral 1989    There were no vitals filed for this visit.   Subjective Assessment - 11/19/21 1534     Subjective Patient reports that he has a tear of the Rt rotator cuff. He will have reverse TSA of Rt shoulder; not scheduled. He has Charot disease of the Lt foot. He is not sure what treatment options he will have to address the Lt foot. He is doing well with the Lt shoulder. No pain in the Lt shoulder.    Currently in Pain? No/denies    Pain Score 0-No pain    Pain Location Shoulder    Pain Orientation Left                [facility] PT Assessment - 11/19/21 0001       Assessment   Medical Diagnosis Lt reverse TSA    Referring Provider (PT) Dr [name]    Onset Date/Surgical Date 09/30/21   earlier surgery 03/04/21; pain for yrs   Hand Dominance Right    Next MD Visit 3 months    Prior Therapy here for knee and Lt shoulder      AROM   Left Shoulder Extension --   neutral   Left Shoulder Flexion 112 Degrees    Left Shoulder ABduction 72 Degrees    Left Shoulder External Rotation 22 Degrees   elbow at side                          [facility] Adult PT Treatment/Exercise - 11/19/21 0001       Shoulder Exercises: Seated   Retraction Strengthening;Both;10 reps    Retraction Limitations scap squeeze    External Rotation Strengthening;Left;10 reps;Theraband    Theraband Level (Shoulder External Rotation) Level 1 (Yellow)    Flexion Strengthening;Left;10 reps;Theraband    Theraband Level (Shoulder Flexion) Level 2 (Red)    Abduction Strengthening;Left;10 reps;Theraband    Theraband Level (Shoulder ABduction) Level 3 (Green)      Shoulder Exercises: Pulleys   Flexion Limitations 10 reps 10 sec hold    Scaption Limitations 10 reps 10  sec hold      Shoulder Exercises: Stretch   External Rotation Stretch 5 reps;10 seconds   standing with cane - elbow at 90 deg neutral at side                    PT Education - 11/19/21 1557     Education Details HEP    Person(s) Educated Patient    Methods Explanation;Demonstration;Tactile cues;Verbal cues;Handout    Comprehension Verbalized understanding;Returned demonstration;Verbal cues required;Tactile cues required  PT Short Term Goals - 10/28/21 1256       PT SHORT TERM GOAL #1   Title Initiate HEP    Time 6    Period Weeks    Status New    Target Date 12/09/21      PT SHORT TERM GOAL #2   Title Increase AROM Lt shoulder to 90 deg flexion; 30 deg ER arm at side    Time 6    Period Weeks    Status New    Target Date 12/09/21      PT SHORT TERM GOAL #3   Title Patient compliant with post op protocol for reerse TSA    Time 6    Period Weeks    Status New    Target Date 12/09/21      PT SHORT TERM GOAL #4   Title Patient to demonstrate improved posture and alignment with posterior shoulder girdle engaged    Time 6    Period Weeks    Status New    Target Date 12/09/21               PT Long Term Goals - 10/28/21 1259       PT LONG TERM GOAL #1   Title Improve posture and alignment with patient to demonstrate improved upright posture with posterior shoulder girdle engaged    Time 12    Period Weeks    Status New    Target Date 01/20/22      PT LONG TERM GOAL #2   Title PROM to [facility] Xxx Lt shoulder as tissue condition indicates per MD protcol    Time 12    Period Weeks    Status New    Target Date 01/20/22      PT LONG TERM GOAL #3   Title AROM to strengthening Lt UE as indicated and MD approves working to improve strength in Lt shoulder girdle to compensate for absent rotator cuff strength    Time 12    Period Weeks    Status New    Target Date 01/20/22      PT LONG TERM GOAL #4   Title Independent in HEP    Time  12    Period Weeks    Status New    Target Date 01/20/22      PT LONG TERM GOAL #5   Title Improve functional limitation score to 56    Time 12    Period Weeks    Status New    Target Date 01/20/22                   Plan - 11/19/21 1539     Clinical Impression Statement Patient returns with report of additional medical problems with Rt shoulder and Lt foot. Lt shoulder is doing well. Good progress with AROM Lt shoulder. Added light resistive exercises Lt UE without difficulty with modification to do exercises in sitting - pt is NWB Lt LE. Note good gains in AROM shoulder flexion. Good improvement in scar tissue mobility anterior Lt shoulder.    Rehab Potential Good    PT Frequency 2x / week    PT Duration 12 weeks    PT Treatment/Interventions ADLs/Self Care Home Management;Aquatic Therapy;Cryotherapy;Electrical Stimulation;Iontophoresis 4mg /ml Dexamethasone;Moist Heat;Ultrasound;Functional mobility training;Therapeutic activities;Therapeutic exercise;Neuromuscular re-education;Patient/family education;Manual techniques;Passive range of motion;Dry needling;Taping;Vasopneumatic Device    PT Next Visit Plan review HEP; add ER to </= 30 deg; PROM to AAROM and stretching Lt shoulder; pulley; light resistive exercise  with TB; manual work and modalities as indicated - NWB Lt LE - continue per protocol    PT [place]             Patient will benefit from skilled therapeutic intervention in order to improve the following deficits and impairments:     Visit Diagnosis: Chronic left shoulder pain  Muscle weakness (generalized)  Abnormal posture  Other symptoms and signs involving the musculoskeletal system  Acute pain of left shoulder     Problem List Patient Active Problem List   Diagnosis Date Noted   Charcot foot due to diabetes mellitus ([place]) 11/16/2021   Drug rash 11/16/2021   Status post reverse total replacement of left shoulder 09/30/2021    Hyperuricemia 07/14/2021   Septic joint of left shoulder region ([place]) 04/29/2021   Osteomyelitis of second toe of left foot (HCC)    Penicillin allergy    Chronic osteomyelitis ([place])    Diabetic foot infection ([place])    S/p total knee replacement, bilateral    Septic arthritis of shoulder, left ([place]) 03/03/2021   Atrial fibrillation, chronic ([place]) 03/03/2021   Chronic anticoagulation 03/03/2021   DM (diabetes mellitus), type 2 with neurological complications ([place]) 92/90/9030   Essential hypertension 03/03/2021   Hypokalemia 03/03/2021   Prolonged QT interval 03/03/2021   COPD (chronic obstructive pulmonary disease) ([place]) 03/03/2021   Chronic CHF (congestive heart failure) ([place]) 03/03/2021   Diabetic peripheral neuropathy ([place]) 01/03/2019   Obesity 06/01/2016   Wound dehiscence 05/04/2016   Knee osteoarthritis 04/06/2016    Kaytlynn Kochan Nilda Simmer, PT, MPH  11/19/2021, [place], [name], [address] Phone: [phone]   Fax:  [phone]  Name: Willie Pineda MRN: [id number] Date of Birth: 1946-10-20

## 2021-11-19 NOTE — Patient Instructions (Signed)
Video on patients's phone of Abd; FF; ER resistively with TB  Therapist providing verbal instruction for exercise on video

## 2021-11-20 DIAGNOSIS — M14672 Charcot's joint, left ankle and foot: Secondary | ICD-10-CM | POA: Diagnosis not present

## 2021-11-20 DIAGNOSIS — M79672 Pain in left foot: Secondary | ICD-10-CM | POA: Diagnosis not present

## 2021-12-07 DIAGNOSIS — I429 Cardiomyopathy, unspecified: Secondary | ICD-10-CM | POA: Diagnosis not present

## 2021-12-08 DIAGNOSIS — M79672 Pain in left foot: Secondary | ICD-10-CM | POA: Diagnosis not present

## 2021-12-08 DIAGNOSIS — M14679 Charcot's joint, unspecified ankle and foot: Secondary | ICD-10-CM | POA: Diagnosis not present

## 2021-12-09 DIAGNOSIS — H0100A Unspecified blepharitis right eye, upper and lower eyelids: Secondary | ICD-10-CM | POA: Diagnosis not present

## 2021-12-09 DIAGNOSIS — R350 Frequency of micturition: Secondary | ICD-10-CM | POA: Diagnosis not present

## 2021-12-09 DIAGNOSIS — H401131 Primary open-angle glaucoma, bilateral, mild stage: Secondary | ICD-10-CM | POA: Diagnosis not present

## 2021-12-09 DIAGNOSIS — H04123 Dry eye syndrome of bilateral lacrimal glands: Secondary | ICD-10-CM | POA: Diagnosis not present

## 2021-12-09 DIAGNOSIS — H0100B Unspecified blepharitis left eye, upper and lower eyelids: Secondary | ICD-10-CM | POA: Diagnosis not present

## 2021-12-10 ENCOUNTER — Ambulatory Visit: Payer: Medicare Other | Admitting: Orthopedic Surgery

## 2021-12-11 ENCOUNTER — Encounter: Payer: Medicare Other | Admitting: Rehabilitative and Restorative Service Providers"

## 2021-12-11 DIAGNOSIS — G5712 Meralgia paresthetica, left lower limb: Secondary | ICD-10-CM | POA: Diagnosis not present

## 2021-12-14 ENCOUNTER — Encounter: Payer: Medicare Other | Admitting: Rehabilitative and Restorative Service Providers"

## 2021-12-14 DIAGNOSIS — Z8673 Personal history of transient ischemic attack (TIA), and cerebral infarction without residual deficits: Secondary | ICD-10-CM | POA: Diagnosis not present

## 2021-12-14 DIAGNOSIS — G4733 Obstructive sleep apnea (adult) (pediatric): Secondary | ICD-10-CM | POA: Diagnosis not present

## 2021-12-14 DIAGNOSIS — I428 Other cardiomyopathies: Secondary | ICD-10-CM | POA: Diagnosis not present

## 2021-12-14 DIAGNOSIS — I4510 Unspecified right bundle-branch block: Secondary | ICD-10-CM | POA: Diagnosis not present

## 2021-12-14 DIAGNOSIS — I482 Chronic atrial fibrillation, unspecified: Secondary | ICD-10-CM | POA: Diagnosis not present

## 2021-12-14 DIAGNOSIS — I10 Essential (primary) hypertension: Secondary | ICD-10-CM | POA: Diagnosis not present

## 2021-12-14 DIAGNOSIS — Z9989 Dependence on other enabling machines and devices: Secondary | ICD-10-CM | POA: Diagnosis not present

## 2021-12-14 DIAGNOSIS — I5022 Chronic systolic (congestive) heart failure: Secondary | ICD-10-CM | POA: Diagnosis not present

## 2021-12-15 DIAGNOSIS — I482 Chronic atrial fibrillation, unspecified: Secondary | ICD-10-CM | POA: Diagnosis not present

## 2021-12-15 DIAGNOSIS — I10 Essential (primary) hypertension: Secondary | ICD-10-CM | POA: Diagnosis not present

## 2021-12-16 ENCOUNTER — Ambulatory Visit: Payer: Medicare Other | Attending: Orthopaedic Surgery | Admitting: Rehabilitative and Restorative Service Providers"

## 2021-12-16 ENCOUNTER — Other Ambulatory Visit: Payer: Self-pay

## 2021-12-16 ENCOUNTER — Encounter: Payer: Self-pay | Admitting: Rehabilitative and Restorative Service Providers"

## 2021-12-16 DIAGNOSIS — G8929 Other chronic pain: Secondary | ICD-10-CM | POA: Insufficient documentation

## 2021-12-16 DIAGNOSIS — M6281 Muscle weakness (generalized): Secondary | ICD-10-CM | POA: Diagnosis not present

## 2021-12-16 DIAGNOSIS — R293 Abnormal posture: Secondary | ICD-10-CM | POA: Insufficient documentation

## 2021-12-16 DIAGNOSIS — M25512 Pain in left shoulder: Secondary | ICD-10-CM | POA: Diagnosis not present

## 2021-12-16 DIAGNOSIS — R29898 Other symptoms and signs involving the musculoskeletal system: Secondary | ICD-10-CM | POA: Diagnosis not present

## 2021-12-16 NOTE — Therapy (Addendum)
Willie [place]ined[place], [name], [address] Phone: [phone]   Fax:  [phone]  Physical Therapy Treatment and Discharge Summary  PHYSICAL THERAPY DISCHARGE SUMMARY  Visits from Start of Care: 4  Current functional level related to goals / functional outcomes: See progress note for discharge status    Remaining deficits: Unknown    Education / Equipment: HEP    Patient agrees to discharge. Patient goals were partially met. Patient is being discharged due to not returning since the last visit.  Willie Pineda P. Helene Kelp PT, MPH 01/26/22 11:12 AM  Patient Details  Name: Willie Pineda MRN: [id number] Date of Birth: 05-28-1946 Referring Provider (PT): Dr [name]   Encounter Date: 12/16/2021   PT End of Session - 12/16/21 1436     Visit Number 4    Number of Visits 24    Date for PT Re-Evaluation 01/20/22    PT Start Time 1434    PT Stop Time 1510    PT Time Calculation (min) 36 min    Activity Tolerance Patient tolerated treatment well             Past Medical History:  Diagnosis Date   AICD (automatic cardioverter/defibrillator) present    Arthritis    "hands primarily" (05/05/2016)   Atrial fibrillation ([place])    Back pain    age related   CHF (congestive heart failure) ([place])    no problemes since 8127   Complication of anesthesia    Bronchial spasms   COPD (chronic obstructive pulmonary disease) ([place])    inhalers as needed   Coronary artery disease    Depression    takes Zoloft daily   Diabetic peripheral neuropathy ([place]) 01/03/2019   Drug rash 11/16/2021   Dysrhythmia    A Fib-takes Xarelto daily   Enlarged prostate    benign   GERD (gastroesophageal reflux disease)    takes Omeprazole daily   Gout    History of blood clots    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)   Hyperlipidemia    takes Atorvastatin daily   Hypertension    takes Metoprolol,HCTZ,and Losartan daily.Takes  Clonidine if needed   Hyperuricemia 07/14/2021   Nocturia    Peripheral neuropathy    takes Gabapentin daily   Peripheral vascular disease (HCC)    legs blockage    Pneumonia ?2015   Presence of permanent cardiac pacemaker    Restless leg    Sleep apnea    uses Bipap   Stroke [facility]) 2012   denies residual on 05/05/2016    Past Surgical History:  Procedure Laterality Date   AMPUTATION Left 04/03/2021   Procedure: LEFT 2ND RAY AMPUTATION;  Surgeon: Newt Minion, MD;  Location: [place];  Service: Orthopedics;  Laterality: Left;   CARPAL TUNNEL RELEASE     CATARACT EXTRACTION W/ INTRAOCULAR LENS  IMPLANT, BILATERAL Bilateral    COLONOSCOPY WITH PROPOFOL N/A 03/09/2017   Procedure: COLONOSCOPY WITH PROPOFOL;  Surgeon: Wonda Horner, MD;  Location: [facility] ENDOSCOPY;  Service: Endoscopy;  Laterality: N/A;   COLONOSCOPY WITH PROPOFOL N/A 04/03/2020   Procedure: COLONOSCOPY WITH PROPOFOL;  Surgeon: Wonda Horner, MD;  [facility];  Service: Endoscopy;  Laterality: N/A;   I & D KNEE WITH POLY EXCHANGE Right 05/04/2016   IRRIGATION AND DEBRIDEMENT Right KNEE WITH UNICOMPARTMENTAL POLY EXCHANGE   I & D KNEE WITH POLY EXCHANGE Right 05/04/2016   Procedure: IRRIGATION AND DEBRIDEMENT  Right KNEE WITH UNICOMPARTMENTAL POLY EXCHANGE;  Surgeon: Renette Butters, MD;  Location: [place];  Service: Orthopedics;  Laterality: Right;   JOINT REPLACEMENT     LAPAROSCOPIC CHOLECYSTECTOMY  1995   PARTIAL KNEE ARTHROPLASTY Right 04/06/2016   Procedure: UNICOMPARTMENTAL RIGHT KNEE;  Surgeon: Renette Butters, MD;  Location: [place];  Service: Orthopedics;  Laterality: Right;   POLYPECTOMY  04/03/2020   Procedure: POLYPECTOMY;  Surgeon: Wonda Horner, MD;  [facility];  Service: Endoscopy;;   REVERSE SHOULDER ARTHROPLASTY Left 04/29/2021   Procedure: Revision antibiotic spacer placement. Incision and drainage.;  Surgeon: [name], MD;  [facility];  Service: Orthopedics;  Laterality:  Left;   REVERSE SHOULDER ARTHROPLASTY Left 09/30/2021   Procedure: REVERSE SHOULDER ARTHROPLASTY;  Surgeon: [name], MD;  [facility];  Service: Orthopedics;  Laterality: Left;   STRABISMUS SURGERY Bilateral 06/10/2017   Procedure: REPAIR STRABISMUS BILATERAL;  Surgeon: Everitt Amber, MD;  Location: [place];  Service: Ophthalmology;  Laterality: Bilateral;   TONSILLECTOMY AND ADENOIDECTOMY     TOTAL KNEE ARTHROPLASTY Left 2008   TOTAL SHOULDER ARTHROPLASTY Left 03/04/2021   Procedure: Total Shoulder Arthroplasty;  Surgeon: [name], MD;  [facility];  Service: Orthopedics;  Laterality: Left;   ULNAR NERVE TRANSPOSITION Bilateral 1989    There were no vitals filed for this visit.   Subjective Assessment - 12/16/21 1436     Subjective Patient reports that he has soreness in both shoulders. May have been overdoing the resistive exercises with theraband. Almost fell when leaving doctor's office yesterday and caught himself with the Lt UE. Sees ortho for Lt foot Friday - remains NWB on knee scooter.    Currently in Pain? Yes    Pain Score 6     Pain Location Shoulder    Pain Orientation Left    Pain Type Surgical pain;Chronic pain    Pain Onset More than a month ago    Pain Frequency Intermittent                [facility] PT Assessment - 12/16/21 0001       Assessment   Medical Diagnosis Lt reverse TSA    Referring Provider (PT) Dr [name]    Onset Date/Surgical Date 09/30/21   earlier surgery 03/04/21; pain for yrs   Hand Dominance Right    Next MD Visit 3 months    Prior Therapy here for knee and Lt shoulder      AROM   Left Shoulder Extension --   neutral   Left Shoulder Flexion 114 Degrees    Left Shoulder ABduction 118 Degrees   in scapular plane   Left Shoulder External Rotation 12 Degrees   elbow at side     PROM   Left Shoulder Flexion 135 Degrees   no pain or tissue resistance   Left Shoulder ABduction 125 Degrees   in  scapular plane   Left Shoulder External Rotation 24 Degrees   elbow at side     Palpation   Palpation comment muscular tightness Lt shoulder girdle - pecs, upper trp, leveator, teres, deltoid                           [facility] Adult PT Treatment/Exercise - 12/16/21 0001       Shoulder Exercises: Seated   Retraction Strengthening;Both;10 reps    Retraction Limitations scap squeeze    External Rotation  Strengthening;Left;10 reps;Theraband    Theraband Level (Shoulder External Rotation) Level 1 (Yellow)    Flexion Strengthening;Left;10 reps;Theraband    Theraband Level (Shoulder Flexion) Level 2 (Red)    Abduction Strengthening;Left;10 reps;Theraband    ABduction Limitations PT resistance      Shoulder Exercises: Pulleys   Flexion Limitations 10 reps 10 sec hold    Scaption Limitations 10 reps 10 sec hold      Shoulder Exercises: Stretch   External Rotation Stretch 5 reps;10 seconds   standing with cane - elbow at 90 deg neutral at side     Manual Therapy   Soft tissue mobilization Lt shoulder girdle - pecs; upper trap; leveator; deltoid; biceps; forearm    Passive ROM Lt shoulder flexion; abduction in scapution; ER in scapular plane within limits of protocol                       PT Short Term Goals - 10/28/21 1256       PT SHORT TERM GOAL #1   Title Initiate HEP    Time 6    Period Weeks    Status New    Target Date 12/09/21      PT SHORT TERM GOAL #2   Title Increase AROM Lt shoulder to 90 deg flexion; 30 deg ER arm at side    Time 6    Period Weeks    Status New    Target Date 12/09/21      PT SHORT TERM GOAL #3   Title Patient compliant with post op protocol for reerse TSA    Time 6    Period Weeks    Status New    Target Date 12/09/21      PT SHORT TERM GOAL #4   Title Patient to demonstrate improved posture and alignment with posterior shoulder girdle engaged    Time 6    Period Weeks    Status New    Target Date 12/09/21                PT Long Term Goals - 10/28/21 1259       PT LONG TERM GOAL #1   Title Improve posture and alignment with patient to demonstrate improved upright posture with posterior shoulder girdle engaged    Time 12    Period Weeks    Status New    Target Date 01/20/22      PT LONG TERM GOAL #2   Title PROM to [facility] Lt shoulder as tissue condition indicates per MD protcol    Time 12    Period Weeks    Status New    Target Date 01/20/22      PT LONG TERM GOAL #3   Title AROM to strengthening Lt UE as indicated and MD approves working to improve strength in Lt shoulder girdle to compensate for absent rotator cuff strength    Time 12    Period Weeks    Status New    Target Date 01/20/22      PT LONG TERM GOAL #4   Title Independent in HEP    Time 12    Period Weeks    Status New    Target Date 01/20/22      PT LONG TERM GOAL #5   Title Improve functional limitation score to 56    Time 12    Period Weeks    Status New    Target Date 01/20/22  Plan - 12/16/21 1504     Clinical Impression Statement Willie Pineda continues to be NWB Lt LE and is now using a knee scooter and wheelchair. He has continued to work on [employer] and reports soreness from overdoing the exercises and falling while on scooter. Patient has muscular tightness and pain or discomfort with AROM Lt shoulder. No new exercises today. Remined patient not to overdo exercises and activities using Lt UE. Should be OK to add light resistance with free weights next week. Instructed in exercise not to exceed 10 reps 3 x/wk.    Rehab Potential Good    PT Frequency 2x / week    PT Duration 12 weeks    PT Treatment/Interventions ADLs/Self Care Home Management;Aquatic Therapy;Cryotherapy;Electrical Stimulation;Iontophoresis 4mg /ml Dexamethasone;Moist Heat;Ultrasound;Functional mobility training;Therapeutic activities;Therapeutic exercise;Neuromuscular re-education;Patient/family education;Manual  techniques;Passive range of motion;Dry needling;Taping;Vasopneumatic Device    PT Next Visit Plan review HEP; add ER to </= 30 deg; PROM to AAROM and stretching Lt shoulder; pulley; light resistive exercise with TB; manual work and modalities as indicated - NWB Lt LE - continue per protocol    PT Home Exercise Plan [email or web address]    Consulted and Agree with Plan of Care Patient             Patient will benefit from skilled therapeutic intervention in order to improve the following deficits and impairments:     Visit Diagnosis: Chronic left shoulder pain  Muscle weakness (generalized)  Abnormal posture  Other symptoms and signs involving the musculoskeletal system  Acute pain of left shoulder     Problem List Patient Active Problem List   Diagnosis Date Noted   Charcot foot due to diabetes mellitus ([place]) 11/16/2021   Drug rash 11/16/2021   Status post reverse total replacement of left shoulder 09/30/2021   Hyperuricemia 07/14/2021   Septic joint of left shoulder region ([place]) 04/29/2021   Osteomyelitis of second toe of left foot (HCC)    Penicillin allergy    Chronic osteomyelitis ([place])    Diabetic foot infection ([place])    S/p total knee replacement, bilateral    Septic arthritis of shoulder, left ([place]) 03/03/2021   Atrial fibrillation, chronic ([place]) 03/03/2021   Chronic anticoagulation 03/03/2021   DM (diabetes mellitus), type 2 with neurological complications ([place]) 76/16/0737   Essential hypertension 03/03/2021   Hypokalemia 03/03/2021   Prolonged QT interval 03/03/2021   COPD (chronic obstructive pulmonary disease) ([place]) 03/03/2021   Chronic CHF (congestive heart failure) ([place]) 03/03/2021   Diabetic peripheral neuropathy ([place]) 01/03/2019   Obesity 06/01/2016   Wound dehiscence 05/04/2016   Knee osteoarthritis 04/06/2016    Aiysha Jillson Nilda Simmer, PT, MPH  12/16/2021, 3:08 PM  [facility] [place] [address] [place],  [name], [address] Phone: [phone]   Fax:  [phone]  Name: Willie Pineda MRN: [id number] Date of Birth: 1946-08-27

## 2021-12-18 DIAGNOSIS — M14672 Charcot's joint, left ankle and foot: Secondary | ICD-10-CM | POA: Diagnosis not present

## 2021-12-29 DIAGNOSIS — M19012 Primary osteoarthritis, left shoulder: Secondary | ICD-10-CM | POA: Diagnosis not present

## 2021-12-29 DIAGNOSIS — M25511 Pain in right shoulder: Secondary | ICD-10-CM | POA: Diagnosis not present

## 2021-12-30 ENCOUNTER — Ambulatory Visit: Payer: Medicare Other | Admitting: Rehabilitative and Restorative Service Providers"

## 2022-01-01 DIAGNOSIS — M79672 Pain in left foot: Secondary | ICD-10-CM | POA: Diagnosis not present

## 2022-01-01 DIAGNOSIS — M14672 Charcot's joint, left ankle and foot: Secondary | ICD-10-CM | POA: Diagnosis not present

## 2022-01-02 DIAGNOSIS — M14672 Charcot's joint, left ankle and foot: Secondary | ICD-10-CM | POA: Diagnosis not present

## 2022-01-12 DIAGNOSIS — M79672 Pain in left foot: Secondary | ICD-10-CM | POA: Diagnosis not present

## 2022-01-12 DIAGNOSIS — M14672 Charcot's joint, left ankle and foot: Secondary | ICD-10-CM | POA: Diagnosis not present

## 2022-01-14 DIAGNOSIS — H401131 Primary open-angle glaucoma, bilateral, mild stage: Secondary | ICD-10-CM | POA: Diagnosis not present

## 2022-01-14 DIAGNOSIS — H04123 Dry eye syndrome of bilateral lacrimal glands: Secondary | ICD-10-CM | POA: Diagnosis not present

## 2022-01-19 DIAGNOSIS — R350 Frequency of micturition: Secondary | ICD-10-CM | POA: Diagnosis not present

## 2022-02-02 DIAGNOSIS — M14672 Charcot's joint, left ankle and foot: Secondary | ICD-10-CM | POA: Diagnosis not present

## 2022-02-17 DIAGNOSIS — I10 Essential (primary) hypertension: Secondary | ICD-10-CM | POA: Diagnosis not present

## 2022-02-17 DIAGNOSIS — E1142 Type 2 diabetes mellitus with diabetic polyneuropathy: Secondary | ICD-10-CM | POA: Diagnosis not present

## 2022-03-02 DIAGNOSIS — M14672 Charcot's joint, left ankle and foot: Secondary | ICD-10-CM | POA: Diagnosis not present

## 2022-03-05 DIAGNOSIS — I428 Other cardiomyopathies: Secondary | ICD-10-CM | POA: Diagnosis not present

## 2022-03-05 DIAGNOSIS — I482 Chronic atrial fibrillation, unspecified: Secondary | ICD-10-CM | POA: Diagnosis not present

## 2022-03-05 DIAGNOSIS — I4510 Unspecified right bundle-branch block: Secondary | ICD-10-CM | POA: Diagnosis not present

## 2022-03-05 DIAGNOSIS — I5022 Chronic systolic (congestive) heart failure: Secondary | ICD-10-CM | POA: Diagnosis not present

## 2022-03-05 DIAGNOSIS — I10 Essential (primary) hypertension: Secondary | ICD-10-CM | POA: Diagnosis not present

## 2022-03-05 DIAGNOSIS — E785 Hyperlipidemia, unspecified: Secondary | ICD-10-CM | POA: Diagnosis not present

## 2022-03-05 DIAGNOSIS — Z8673 Personal history of transient ischemic attack (TIA), and cerebral infarction without residual deficits: Secondary | ICD-10-CM | POA: Diagnosis not present

## 2022-03-05 DIAGNOSIS — I2510 Atherosclerotic heart disease of native coronary artery without angina pectoris: Secondary | ICD-10-CM | POA: Diagnosis not present

## 2022-03-05 DIAGNOSIS — Z7901 Long term (current) use of anticoagulants: Secondary | ICD-10-CM | POA: Diagnosis not present

## 2022-03-08 DIAGNOSIS — I428 Other cardiomyopathies: Secondary | ICD-10-CM | POA: Diagnosis not present

## 2022-03-08 DIAGNOSIS — I10 Essential (primary) hypertension: Secondary | ICD-10-CM | POA: Diagnosis not present

## 2022-03-08 DIAGNOSIS — I4821 Permanent atrial fibrillation: Secondary | ICD-10-CM | POA: Diagnosis not present

## 2022-03-08 DIAGNOSIS — I429 Cardiomyopathy, unspecified: Secondary | ICD-10-CM | POA: Diagnosis not present

## 2022-03-23 DIAGNOSIS — M5412 Radiculopathy, cervical region: Secondary | ICD-10-CM | POA: Diagnosis not present

## 2022-03-23 DIAGNOSIS — M19012 Primary osteoarthritis, left shoulder: Secondary | ICD-10-CM | POA: Diagnosis not present

## 2022-03-24 DIAGNOSIS — E0840 Diabetes mellitus due to underlying condition with diabetic neuropathy, unspecified: Secondary | ICD-10-CM | POA: Diagnosis not present

## 2022-03-24 DIAGNOSIS — D6869 Other thrombophilia: Secondary | ICD-10-CM | POA: Diagnosis not present

## 2022-03-24 DIAGNOSIS — Z79899 Other long term (current) drug therapy: Secondary | ICD-10-CM | POA: Diagnosis not present

## 2022-03-24 DIAGNOSIS — M14672 Charcot's joint, left ankle and foot: Secondary | ICD-10-CM | POA: Diagnosis not present

## 2022-03-24 DIAGNOSIS — I509 Heart failure, unspecified: Secondary | ICD-10-CM | POA: Diagnosis not present

## 2022-03-24 DIAGNOSIS — E559 Vitamin D deficiency, unspecified: Secondary | ICD-10-CM | POA: Diagnosis not present

## 2022-03-24 DIAGNOSIS — M109 Gout, unspecified: Secondary | ICD-10-CM | POA: Diagnosis not present

## 2022-03-24 DIAGNOSIS — Z0000 Encounter for general adult medical examination without abnormal findings: Secondary | ICD-10-CM | POA: Diagnosis not present

## 2022-03-24 DIAGNOSIS — Z1389 Encounter for screening for other disorder: Secondary | ICD-10-CM | POA: Diagnosis not present

## 2022-03-24 DIAGNOSIS — E781 Pure hyperglyceridemia: Secondary | ICD-10-CM | POA: Diagnosis not present

## 2022-03-24 DIAGNOSIS — I10 Essential (primary) hypertension: Secondary | ICD-10-CM | POA: Diagnosis not present

## 2022-03-24 DIAGNOSIS — R209 Unspecified disturbances of skin sensation: Secondary | ICD-10-CM | POA: Diagnosis not present

## 2022-03-24 DIAGNOSIS — E1165 Type 2 diabetes mellitus with hyperglycemia: Secondary | ICD-10-CM | POA: Diagnosis not present

## 2022-03-24 DIAGNOSIS — E11610 Type 2 diabetes mellitus with diabetic neuropathic arthropathy: Secondary | ICD-10-CM | POA: Diagnosis not present

## 2022-03-24 DIAGNOSIS — J449 Chronic obstructive pulmonary disease, unspecified: Secondary | ICD-10-CM | POA: Diagnosis not present

## 2022-03-24 DIAGNOSIS — I739 Peripheral vascular disease, unspecified: Secondary | ICD-10-CM | POA: Diagnosis not present

## 2022-03-24 DIAGNOSIS — I4891 Unspecified atrial fibrillation: Secondary | ICD-10-CM | POA: Diagnosis not present

## 2022-03-24 DIAGNOSIS — E785 Hyperlipidemia, unspecified: Secondary | ICD-10-CM | POA: Diagnosis not present

## 2022-04-01 ENCOUNTER — Encounter: Payer: Self-pay | Admitting: Rehabilitative and Restorative Service Providers"

## 2022-04-01 ENCOUNTER — Ambulatory Visit: Payer: Medicare Other | Attending: Orthopaedic Surgery | Admitting: Rehabilitative and Restorative Service Providers"

## 2022-04-01 DIAGNOSIS — R29898 Other symptoms and signs involving the musculoskeletal system: Secondary | ICD-10-CM

## 2022-04-01 DIAGNOSIS — R293 Abnormal posture: Secondary | ICD-10-CM

## 2022-04-01 DIAGNOSIS — M539 Dorsopathy, unspecified: Secondary | ICD-10-CM

## 2022-04-01 DIAGNOSIS — M5412 Radiculopathy, cervical region: Secondary | ICD-10-CM | POA: Insufficient documentation

## 2022-04-01 DIAGNOSIS — M25512 Pain in left shoulder: Secondary | ICD-10-CM

## 2022-04-01 NOTE — Patient Instructions (Signed)
Access Code: [email or web address] ?URL: [email or web address] ?Date: 04/01/2022 ?Prepared by: Gillermo Murdoch ? ?Exercises ?- Seated Cervical Retraction  - 2 x daily - 7 x weekly - 1-2 sets - 5-10 reps - 10 sec  hold ?- Standing Cervical Retraction with Sidebending  - 2 x daily - 7 x weekly - 1 sets - 5-6 reps - 10-15 sec  hold ?- Seated Cervical Retraction and Rotation  - 2 x daily - 7 x weekly - 1 sets - 5-6 reps - 10-15 sec  hold ?- Seated Scapular Retraction  - 2 x daily - 7 x weekly - 1-2 sets - 10 reps - 10 sec  hold ?- Shoulder External Rotation and Scapular Retraction  - 3 x daily - 7 x weekly - 1 sets - 10 reps - 3-5 sec   hold ?

## 2022-04-01 NOTE — Therapy (Signed)
[place] ?[address] ?[place], [name], [address] ?Phone: [phone]   Fax:  [phone] ? ?Physical Therapy Evaluation ? ?Patient Details  ?Name: Willie Pineda ?MRN: [id number] ?Date of Birth: 27-Jan-1946 ?Referring Provider (PT): Dr [name] ? ? ?Encounter Date: 04/01/2022 ? ? PT End of Session - 04/01/22 1505   ? ? Visit Number 1   ? Number of Visits 12   ? Date for PT Re-Evaluation 05/13/22   ? Progress Note Due on Visit 10   ? PT Start Time 1402   ? PT Stop Time 1500   ? PT Time Calculation (min) 58 min   ? Activity Tolerance Patient tolerated treatment well   ? ?  ?  ? ?  ? ? ?Past Medical History:  ?Diagnosis Date  ? AICD (automatic cardioverter/defibrillator) present   ? Arthritis   ? "hands primarily" (05/05/2016)  ? Atrial fibrillation ([place])   ? Back pain   ? age related  ? CHF (congestive heart failure) ([place])   ? no problemes since 2018  ? Complication of anesthesia   ? Bronchial spasms  ? COPD (chronic obstructive pulmonary disease) ([place])   ? inhalers as needed  ? Coronary artery disease   ? Depression   ? takes Zoloft daily  ? Diabetic peripheral neuropathy ([place]) 01/03/2019  ? Drug rash 11/16/2021  ? Dysrhythmia   ? A Fib-takes Xarelto daily  ? Enlarged prostate   ? benign  ? GERD (gastroesophageal reflux disease)   ? takes Omeprazole daily  ? Gout   ? History of blood clots   ? 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 Hyperlipidemia   ? takes Atorvastatin daily  ? Hypertension   ? takes Metoprolol,HCTZ,and Losartan daily.Takes Clonidine if needed  ? Hyperuricemia 07/14/2021  ? Nocturia   ? Peripheral neuropathy   ? takes Gabapentin daily  ? Peripheral vascular disease ([place])   ? legs blockage   ? Pneumonia ?2015  ? Presence of permanent cardiac pacemaker   ? Restless leg   ? Sleep apnea   ? uses Bipap  ? Stroke [facility]) 2012  ? denies residual on 05/05/2016  ? ? ?Past Surgical History:  ?Procedure Laterality Date  ? AMPUTATION Left  04/03/2021  ? Procedure: LEFT 2ND RAY AMPUTATION;  Surgeon: Newt Minion, MD;  Location: [place];  Service: Orthopedics;  Laterality: Left;  ? CARPAL TUNNEL RELEASE    ? CATARACT EXTRACTION W/ INTRAOCULAR LENS  IMPLANT, BILATERAL Bilateral   ? COLONOSCOPY WITH PROPOFOL N/A 03/09/2017  ? Procedure: COLONOSCOPY WITH PROPOFOL;  Surgeon: Wonda Horner, MD;  Location: [facility]COPY;  Service: Endoscopy;  Laterality: N/A;  ? COLONOSCOPY WITH PROPOFOL N/A 04/03/2020  ? Procedure: COLONOSCOPY WITH PROPOFOL;  Surgeon: Wonda Horner, MD;  Location: [facility]COPY;  Service: Endoscopy;  Laterality: N/A;  ? I & D KNEE WITH POLY EXCHANGE Right 05/04/2016  ? IRRIGATION AND DEBRIDEMENT Right KNEE WITH UNICOMPARTMENTAL POLY EXCHANGE  ? I & D KNEE WITH POLY EXCHANGE Right 05/04/2016  ? Procedure: IRRIGATION AND DEBRIDEMENT Right KNEE WITH UNICOMPARTMENTAL POLY EXCHANGE;  Surgeon: Renette Butters, MD;  Location: [place];  Service: Orthopedics;  Laterality: Right;  ? JOINT REPLACEMENT    ? [place]  ? PARTIAL KNEE ARTHROPLASTY Right 04/06/2016  ? Procedure: UNICOMPARTMENTAL RIGHT KNEE;  Surgeon: Renette Butters, MD;  Location: [place];  Service: Orthopedics;  Laterality: Right;  ? POLYPECTOMY  04/03/2020  ?  Procedure: POLYPECTOMY;  Surgeon: Wonda Horner, MD;  Location: [facility];  Service: Endoscopy;;  ? REVERSE SHOULDER ARTHROPLASTY Left 04/29/2021  ? Procedure: Revision antibiotic spacer placement. Incision and drainage.;  Surgeon: [name], MD;  Location: [facility];  Service: Orthopedics;  Laterality: Left;  ? REVERSE SHOULDER ARTHROPLASTY Left 09/30/2021  ? Procedure: REVERSE SHOULDER ARTHROPLASTY;  Surgeon: [name], MD;  [facility]: [facility];  Service: Orthopedics;  Laterality: Left;  ? STRABISMUS SURGERY Bilateral 06/10/2017  ? Procedure: REPAIR STRABISMUS BILATERAL;  Surgeon: Everitt Amber, MD;  Location: [place];  Service: Ophthalmology;  Laterality: Bilateral;  ? TONSILLECTOMY  AND ADENOIDECTOMY    ? TOTAL KNEE ARTHROPLASTY Left 2008  ? TOTAL SHOULDER ARTHROPLASTY Left 03/04/2021  ? Procedure: Total Shoulder Arthroplasty;  Surgeon: [name], MD;  Location: [facility];  Service: Orthopedics;  Laterality: Left;  ? ULNAR NERVE TRANSPOSITION Bilateral 1989  ? ? ?There were no vitals filed for this visit. ? ? ? Subjective Assessment - 04/01/22 1407   ? ? Subjective Patient reports that he started having painin the Lt shoulder ~ 4 weeks ago with no known injury. He was seen my Dr Griffin Basil who feels the shoulder pain is related to neck.   ? Pertinent History Lt reverse TSA; bilat TKA; pacemaker/defibulator; Lt second toe amputation; HTN; CAD; stroke; AODM; cataract sx; sleep apnea; infection Lt shoulder/knee post joint replacements; tingling Lt hand fingertips, palmar surface following carpal tunnel release and ulnar nerve release ~ 3 years ago tingling continues to improve   ? Patient Stated Goals get rid of the Lt shoulder pain and increase mobliity and use of Lt UE   ? Currently in Pain? Yes   ? Pain Score 4    variable pain intensity from 0-10  ? Pain Location Shoulder   ? Pain Orientation Left   ? Pain Descriptors / Indicators Dull;Aching   can be sharp at times  ? Pain Type Acute pain   ? Pain Radiating Towards Lt shoulder joint to elbow and axilla   ? Pain Onset More than a month ago   ? Pain Frequency Intermittent   ? Aggravating Factors  lifting; lifting overhead; unsure what irritates symptoms   ? Pain Relieving Factors topical NSAID; meds; massage gun; ice at times   ? ?  ?  ? ?  ? ? ? ? ? [facility] PT Assessment - 04/01/22 0001   ? ?  ? Assessment  ? Medical Diagnosis Lt cervical radiculopathy   ? Referring Provider (PT) Dr [name]   ? Onset Date/Surgical Date 02/22/22   ? Hand Dominance Right   ? Next MD Visit 04/19/22   ? Prior Therapy here for knee; shoulder   ?  ? Precautions  ? Precautions Shoulder   ? Type of Shoulder Precautions reverse TSA 09/30/21   ?  ? Balance Screen  ? Has the  patient fallen in the past 6 months Yes   ? How many times? 1   ? Has the patient had a decrease in activity level because of a fear of falling?  No   ? Is the patient reluctant to leave their home because of a fear of falling?  No   ?  ? Home Environment  ? Living Environment Private residence   ? Living Arrangements Spouse/significant other   ?  ? Prior Function  ? Level of Independence Independent   ? Vocation Retired   ? Vocation Requirements RN retired 2016   ?  Leisure exercises for UE's; walking; household repair/chores   ?  ? Observation/Other Assessments  ? Focus on Therapeutic Outcomes (FOTO)  49   ?  ? Sensation  ? Additional Comments gradually resloving tingling in the Lt fingertips palmar surface - present for at least the past 3 years since prior to CT release and ulnar nerve release   ?  ? Posture/Postural Control  ? Posture Comments head forward; shoulders rounded and elevated; head of the humerus anterior in orientation; scapulae abducted and rotated along the thoracic wall   ?  ? AROM  ? Overall AROM Comments pain with all AROM Lt shoulder   ? Right Shoulder Extension 57 Degrees   ? Right Shoulder Flexion 141 Degrees   ? Right Shoulder ABduction 153 Degrees   ? Right Shoulder Internal Rotation --   thumb T11/12  ? Right Shoulder External Rotation 68 Degrees   ? Left Shoulder Extension 26 Degrees   painful  ? Left Shoulder Flexion 68 Degrees   painful  ? Left Shoulder ABduction 124 Degrees   painful arc ~ 40 to 85  ? Left Shoulder Internal Rotation 66 Degrees   thumb to lateral hip  ? Cervical Flexion 42   ? Cervical Extension 18   ? Cervical - Right Side Bend 24   ? Cervical - Left Side Bend 22 tight   ? Cervical - Right Rotation 54   ? Cervical - Left Rotation 55   ? ?  ?  ? ?  ? ? ? ? ? ? ? ? ? ? ? ? ? ?Objective measurements completed on examination: See above findings.  ? ? ? ? ? [facility] Adult PT Treatment/Exercise - 04/01/22 0001   ? ?  ? Therapeutic Activites   ? Therapeutic Activities Other  Therapeutic Activities   ? Other Therapeutic Activities discussed and offered suggestions for improved position for reading/sitting   ?  ? Neck Exercises: Seated  ? Neck Retraction 5 reps;10 secs   ? Cervical

## 2022-04-05 ENCOUNTER — Encounter: Payer: Self-pay | Admitting: Rehabilitative and Restorative Service Providers"

## 2022-04-05 ENCOUNTER — Ambulatory Visit: Payer: Medicare Other | Attending: Orthopaedic Surgery | Admitting: Rehabilitative and Restorative Service Providers"

## 2022-04-05 DIAGNOSIS — R29898 Other symptoms and signs involving the musculoskeletal system: Secondary | ICD-10-CM | POA: Insufficient documentation

## 2022-04-05 DIAGNOSIS — M6281 Muscle weakness (generalized): Secondary | ICD-10-CM | POA: Diagnosis not present

## 2022-04-05 DIAGNOSIS — R293 Abnormal posture: Secondary | ICD-10-CM | POA: Diagnosis not present

## 2022-04-05 DIAGNOSIS — M539 Dorsopathy, unspecified: Secondary | ICD-10-CM | POA: Insufficient documentation

## 2022-04-05 DIAGNOSIS — M25512 Pain in left shoulder: Secondary | ICD-10-CM | POA: Insufficient documentation

## 2022-04-05 DIAGNOSIS — G8929 Other chronic pain: Secondary | ICD-10-CM | POA: Diagnosis not present

## 2022-04-05 NOTE — Patient Instructions (Signed)
Trigger Point Dry Needling ? ?What is Trigger Point Dry Needling (DN)? ?DN is a physical therapy technique used to treat muscle pain and dysfunction. Specifically, DN helps deactivate muscle trigger points (muscle knots).  ?A thin filiform needle is used to penetrate the skin and stimulate the underlying trigger point. The goal is for a local twitch response (LTR) to occur and for the trigger point to relax. No medication of any kind is injected during the procedure.  ? ?What Does Trigger Point Dry Needling Feel Like?  ?The procedure feels different for each individual patient. Some patients report that they do not actually feel the needle enter the skin and overall the process is not painful. Very mild bleeding may occur. However, many patients feel a deep cramping in the muscle in which the needle was inserted. This is the local twitch response.  ? ?How Will I feel after the treatment? ?Soreness is normal, and the onset of soreness may not occur for a few hours. Typically this soreness does not last longer than two days.  ?Bruising is uncommon, however; ice can be used to decrease any possible bruising.  ?In rare cases feeling tired or nauseous after the treatment is normal. In addition, your symptoms may get worse before they get better, this period will typically not last longer than 24 hours.  ? ?What Can I do After My Treatment? ?Increase your hydration by drinking more water for the next 24 hours. ?You may place ice or heat on the areas treated that have become sore, however, do not use heat on inflamed or bruised areas. Heat often brings more relief post needling. ?You can continue your regular activities, but vigorous activity is not recommended initially after the treatment for 24 hours. ?DN is best combined with other physical therapy such as strengthening, stretching, and other therapies.  ?Access Code: [email or web address] ?URL: [email or web address] ?Date: 04/05/2022 ?Prepared by: Willie Pineda ? ?Exercises ?- Seated Cervical Retraction  - 2 x daily - 7 x weekly - 1-2 sets - 5-10 reps - 10 sec  hold ?- Standing Cervical Retraction with Sidebending  - 2 x daily - 7 x weekly - 1 sets - 5-6 reps - 10-15 sec  hold ?- Seated Cervical Retraction and Rotation  - 2 x daily - 7 x weekly - 1 sets - 5-6 reps - 10-15 sec  hold ?- Seated Scapular Retraction  - 2 x daily - 7 x weekly - 1-2 sets - 10 reps - 10 sec  hold ?- Shoulder External Rotation and Scapular Retraction  - 3 x daily - 7 x weekly - 1 sets - 10 reps - 3-5 sec   hold ?- Anterior Shoulder and Biceps Stretch  - 2 x daily - 7 x weekly - 1 sets - 3 reps - 30 sec  hold ?- Standing Overhead Triceps Stretch  - 2 x daily - 7 x weekly - 1 sets - 3 reps - 30 sec  hold ?- Supine Chest Stretch on Foam Roll  - 2 x daily - 7 x weekly - 1 sets - 1 reps - 2-5 min  sec  hold ?

## 2022-04-05 NOTE — Therapy (Signed)
[place] ?[facility] ?[address] ?[place], [name], [address] ?Phone: [phone]   Fax:  [phone] ? ?Physical Therapy Treatment ? ?Patient Details  ?Name: Willie Pineda ?MRN: [id number] ?Date of Birth: [birth year] ?Referring Provider (PT): Dr [name] ? ? ?Encounter Date: 04/05/2022 ? ? PT End of Session - 04/05/22 1022   ? ? Visit Number 2   ? Number of Visits 12   ? Date for PT Re-Evaluation 05/13/22   ? Progress Note Due on Visit 10   ? PT Start Time 1020   ? PT Stop Time 1108   ? PT Time Calculation (min) 48 min   ? Activity Tolerance Patient tolerated treatment well   ? ?  ?  ? ?  ? ? ?Past Medical History:  ?Diagnosis Date  ? AICD (automatic cardioverter/defibrillator) present   ? Arthritis   ? "hands primarily" (05/05/2016)  ? Atrial fibrillation ([place])   ? Back pain   ? age related  ? CHF (congestive heart failure) ([place])   ? no problemes since 2018  ? Complication of anesthesia   ? Bronchial spasms  ? COPD (chronic obstructive pulmonary disease) ([place])   ? inhalers as needed  ? Coronary artery disease   ? Depression   ? takes Zoloft daily  ? Diabetic peripheral neuropathy ([place]) 01/03/2019  ? Drug rash 11/16/2021  ? Dysrhythmia   ? A Fib-takes Xarelto daily  ? Enlarged prostate   ? benign  ? GERD (gastroesophageal reflux disease)   ? takes Omeprazole daily  ? Gout   ? History of blood clots   ? 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 Hyperlipidemia   ? takes Atorvastatin daily  ? Hypertension   ? takes Metoprolol,HCTZ,and Losartan daily.Takes Clonidine if needed  ? Hyperuricemia 07/14/2021  ? Nocturia   ? Peripheral neuropathy   ? takes Gabapentin daily  ? Peripheral vascular disease ([place])   ? legs blockage   ? Pneumonia ?2015  ? Presence of permanent cardiac pacemaker   ? Restless leg   ? Sleep apnea   ? uses Bipap  ? Stroke [facility]) 2012  ? denies residual on 05/05/2016  ? ? ?Past Surgical History:  ?Procedure Laterality Date  ? AMPUTATION Left  04/03/2021  ? Procedure: LEFT 2ND RAY AMPUTATION;  Surgeon: Newt Minion, MD;  Location: [place];  Service: Orthopedics;  Laterality: Left;  ? CARPAL TUNNEL RELEASE    ? CATARACT EXTRACTION W/ INTRAOCULAR LENS  IMPLANT, BILATERAL Bilateral   ? COLONOSCOPY WITH PROPOFOL N/A 03/09/2017  ? Procedure: COLONOSCOPY WITH PROPOFOL;  Surgeon: Wonda Horner, MD;  [facility]COPY;  Service: Endoscopy;  Laterality: N/A;  ? COLONOSCOPY WITH PROPOFOL N/A 04/03/2020  ? Procedure: COLONOSCOPY WITH PROPOFOL;  Surgeon: Wonda Horner, MD;  [facility];  Service: Endoscopy;  Laterality: N/A;  ? I & D KNEE WITH POLY EXCHANGE Right 05/04/2016  ? IRRIGATION AND DEBRIDEMENT Right KNEE WITH UNICOMPARTMENTAL POLY EXCHANGE  ? I & D KNEE WITH POLY EXCHANGE Right 05/04/2016  ? Procedure: IRRIGATION AND DEBRIDEMENT Right KNEE WITH UNICOMPARTMENTAL POLY EXCHANGE;  Surgeon: Renette Butters, MD;  Location: [place];  Service: Orthopedics;  Laterality: Right;  ? JOINT REPLACEMENT    ? [place]  ? PARTIAL KNEE ARTHROPLASTY Right 04/06/2016  ? Procedure: UNICOMPARTMENTAL RIGHT KNEE;  Surgeon: Renette Butters, MD;  Location: [place];  Service: Orthopedics;  Laterality: Right;  ? POLYPECTOMY  04/03/2020  ?  Procedure: POLYPECTOMY;  Surgeon: Wonda Horner, MD;  [facility];  Service: Endoscopy;;  ? REVERSE SHOULDER ARTHROPLASTY Left 04/29/2021  ? Procedure: Revision antibiotic spacer placement. Incision and drainage.;  Surgeon: [name], MD;  [facility];  Service: Orthopedics;  Laterality: Left;  ? REVERSE SHOULDER ARTHROPLASTY Left 09/30/2021  ? Procedure: REVERSE SHOULDER ARTHROPLASTY;  Surgeon: [name], MD;  [facility];  Service: Orthopedics;  Laterality: Left;  ? STRABISMUS SURGERY Bilateral 06/10/2017  ? Procedure: REPAIR STRABISMUS BILATERAL;  Surgeon: Everitt Amber, MD;  Location: [place];  Service: Ophthalmology;  Laterality: Bilateral;  ? TONSILLECTOMY  AND ADENOIDECTOMY    ? TOTAL KNEE ARTHROPLASTY Left 2008  ? TOTAL SHOULDER ARTHROPLASTY Left 03/04/2021  ? Procedure: Total Shoulder Arthroplasty;  Surgeon: [name], MD;  [facility];  Service: Orthopedics;  Laterality: Left;  ? ULNAR NERVE TRANSPOSITION Bilateral 1989  ? ? ?There were no vitals filed for this visit. ? ? Subjective Assessment - 04/05/22 1024   ? ? Subjective Good response to initial treatment. Working on neck exercises and added the biceps stretch which has made the most difference. he has had a couple of days with no pain in the Lt shoulder or very little pain.   ? Currently in Pain? Yes   ? Pain Score 2    ? Pain Location Shoulder   ? Pain Orientation Left   ? Pain Descriptors / Indicators Dull   ? Pain Type Acute pain   ? Pain Onset More than a month ago   ? Pain Frequency Intermittent   ? ?  ?  ? ?  ? ? ? ? ? ? ? ? ? ? ? ? ? ? ? ? ? ? ? ? [place] Adult PT Treatment/Exercise - 04/05/22 0001   ? ?  ? Neck Exercises: Seated  ? Neck Retraction 5 reps;10 secs   ? Cervical Rotation Right;Left;5 reps   ? Cervical Rotation Limitations 5-10 sec hold   ? Lateral Flexion Right;Left;5 reps   ? Lateral Flexion Limitations 5-10 sec hold   ?  ? Shoulder Exercises: Supine  ? Other Supine Exercises T stretch ~ 1 min   ? Other Supine Exercises neural mobilization Lt UE ~ 1 min x 2 reps   ?  ? Shoulder Exercises: Seated  ? Other Seated Exercises scap squeeze 10-15 sec hold x 5 reps   ? Other Seated Exercises L x 5 reps   ?  ? Shoulder Exercises: Stretch  ? Other Shoulder Stretches biceps stretch sitting hand ehind body for gentle stretch 20-30 sec x 3 reps   ? Other Shoulder Stretches triceps stretch sitting 30 sec x 3 reps   ?  ? Moist Heat Therapy  ? Number Minutes Moist Heat 10 Minutes   ? Moist Heat Location Shoulder   ?  ? Manual Therapy  ? Manual therapy comments skilled palpation to assess response to DN and manual work   ? Soft tissue mobilization deep tissue work through the Lt anterior and  lateral shoulder - pecs, biceps, deltoid   ? Myofascial Release anterior shoulder - biceps/deltoid   ? ?  ?  ? ?  ? ? ? Trigger Point Dry Needling - 04/05/22 0001   ? ? Consent Given? Yes   ? Education Handout Provided Yes   ? Dry Needling Comments pec at insertion to humerus avoiding pacemaker placement   ? Electrical Stimulation Performed with Dry Needling Yes   ?  E-stim with Dry Needling Details mAmp x 5 min   ? Pectoralis Major Response Palpable increased muscle length   ? Pectoralis Minor Response Palpable increased muscle length   ? Deltoid Response Palpable increased muscle length   ? Biceps Response Palpable increased muscle length   ? ?  ?  ? ?  ? ? ? ? ? ? ? ? PT Education - 04/05/22 1102   ? ? Education Details HEP DN   ? Person(s) Educated Patient   ? Methods Explanation;Demonstration;Tactile cues;Verbal cues;Handout   ? Comprehension Verbalized understanding;Returned demonstration;Verbal cues required;Tactile cues required   ? ?  ?  ? ?  ? ? ? ? ? ? PT Long Term Goals - 04/01/22 1515   ? ?  ? PT LONG TERM GOAL #1  ? Title Improve posture and alignment with patient to demonstrate improved upright posture with posterior shoulder girdle engaged   ? Time 6   ? Status New   ? Target Date 05/13/22   ?  ? PT LONG TERM GOAL #2  ? Title Increase AROM cervical rotation and alteral flexion by 5-7 degrees to improve functional head turns   ? Time 6   ? Period Weeks   ? Status New   ? Target Date 05/13/22   ?  ? PT LONG TERM GOAL #3  ? Title Patient to demonstrate and/or verbalize proper positions and postures for ADL's to avoid irritation of cervical and shoulder pain/problems   ? Time 6   ? Period Weeks   ? Status New   ? Target Date 05/13/22   ?  ? PT LONG TERM GOAL #4  ? Title Independent in [place]   ? Time 6   ? Period Weeks   ? Status New   ? Target Date 05/13/22   ?  ? PT LONG TERM GOAL #5  ? Title Improve functional limitation score to 64   ? Time 6   ? Period Weeks   ? Status New   ? Target Date 05/13/22   ? ?   ?  ? ?  ? ? ? ? ? ? ? ? Plan - 04/05/22 1027   ? ? Clinical Impression Statement Excellent response to initial treatment and HEP. No radicular pain now; just localized pain in the Lt shoulder. Working on [place]

## 2022-04-07 ENCOUNTER — Encounter: Payer: Self-pay | Admitting: Rehabilitative and Restorative Service Providers"

## 2022-04-07 ENCOUNTER — Ambulatory Visit: Payer: Medicare Other | Admitting: Rehabilitative and Restorative Service Providers"

## 2022-04-07 DIAGNOSIS — M6281 Muscle weakness (generalized): Secondary | ICD-10-CM | POA: Diagnosis not present

## 2022-04-07 DIAGNOSIS — M25512 Pain in left shoulder: Secondary | ICD-10-CM

## 2022-04-07 DIAGNOSIS — R29898 Other symptoms and signs involving the musculoskeletal system: Secondary | ICD-10-CM

## 2022-04-07 DIAGNOSIS — M539 Dorsopathy, unspecified: Secondary | ICD-10-CM

## 2022-04-07 DIAGNOSIS — R293 Abnormal posture: Secondary | ICD-10-CM

## 2022-04-07 DIAGNOSIS — G8929 Other chronic pain: Secondary | ICD-10-CM

## 2022-04-07 NOTE — Therapy (Signed)
[place] [facility] ?[place] ?[place], [name], [address] ?Phone: [phone]   Fax:  [phone] ? ?Physical Therapy Treatment ? ?Patient Details  ?Name: Willie Pineda ?MRN: [id number] ?Date of Birth: 1946-06-14 ?Referring Provider (PT): Dr [name] ? ? ?Encounter Date: 04/07/2022 ? ? PT End of Session - 04/07/22 1024   ? ? Visit Number 3   ? Number of Visits 12   ? Date for PT Re-Evaluation 05/13/22   ? Progress Note Due on Visit 10   ? PT Start Time 1015   ? PT Stop Time 1102   ? PT Time Calculation (min) 47 min   ? Activity Tolerance Patient tolerated treatment well   ? ?  ?  ? ?  ? ? ?Past Medical History:  ?Diagnosis Date  ? AICD (automatic cardioverter/defibrillator) present   ? Arthritis   ? "hands primarily" (05/05/2016)  ? Atrial fibrillation ([place])   ? Back pain   ? age related  ? CHF (congestive heart failure) ([place])   ? no problemes since 2018  ? Complication of anesthesia   ? Bronchial spasms  ? COPD (chronic obstructive pulmonary disease) ([place])   ? inhalers as needed  ? Coronary artery disease   ? Depression   ? takes Zoloft daily  ? Diabetic peripheral neuropathy ([place]) 01/03/2019  ? Drug rash 11/16/2021  ? Dysrhythmia   ? A Fib-takes Xarelto daily  ? Enlarged prostate   ? benign  ? GERD (gastroesophageal reflux disease)   ? takes Omeprazole daily  ? Gout   ? History of blood clots   ? 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 Hyperlipidemia   ? takes Atorvastatin daily  ? Hypertension   ? takes Metoprolol,HCTZ,and Losartan daily.Takes Clonidine if needed  ? Hyperuricemia 07/14/2021  ? Nocturia   ? Peripheral neuropathy   ? takes Gabapentin daily  ? Peripheral vascular disease ([place])   ? legs blockage   ? Pneumonia ?2015  ? Presence of permanent cardiac pacemaker   ? Restless leg   ? Sleep apnea   ? uses Bipap  ? Stroke [facility]) 2012  ? denies residual on 05/05/2016  ? ? ?Past Surgical History:  ?Procedure Laterality Date  ? AMPUTATION Left  04/03/2021  ? Procedure: LEFT 2ND RAY AMPUTATION;  Surgeon: Newt Minion, MD;  Location: [place];  Service: Orthopedics;  Laterality: Left;  ? CARPAL TUNNEL RELEASE    ? CATARACT EXTRACTION W/ INTRAOCULAR LENS  IMPLANT, BILATERAL Bilateral   ? COLONOSCOPY WITH PROPOFOL N/A 03/09/2017  ? Procedure: COLONOSCOPY WITH PROPOFOL;  Surgeon: Wonda Horner, MD;  Location: [facility] END[facility]COPY;  Service: Endoscopy;  Laterality: N/A;  ? COLONOSCOPY WITH PROPOFOL N/A 04/03/2020  ? Procedure: COLONOSCOPY WITH PROPOFOL;  Surgeon: Wonda Horner, MD;  [facility]: [facility] ENDOSCOPY;  Service: Endoscopy;  Laterality: N/A;  ? I & D KNEE WITH POLY EXCHANGE Right 05/04/2016  ? IRRIGATION AND DEBRIDEMENT Right KNEE WITH UNICOMPARTMENTAL POLY EXCHANGE  ? I & D KNEE WITH POLY EXCHANGE Right 05/04/2016  ? Procedure: IRRIGATION AND DEBRIDEMENT Right KNEE WITH UNICOMPARTMENTAL POLY EXCHANGE;  Surgeon: Renette Butters, MD;  Location: [place];  Service: Orthopedics;  Laterality: Right;  ? JOINT REPLACEMENT    ? [place]  ? PARTIAL KNEE ARTHROPLASTY Right 04/06/2016  ? Procedure: UNICOMPARTMENTAL RIGHT KNEE;  Surgeon: Renette Butters, MD;  Location: [place];  Service: Orthopedics;  Laterality: Right;  ? POLYPECTOMY  04/03/2020  ?  Procedure: POLYPECTOMY;  Surgeon: Wonda Horner, MD;  Location: [facility] ENDOS[facility];  Service: Endoscopy;;  ? REVERSE SHOULDER ARTHROPLASTY Left 04/29/2021  ? Procedure: Revision antibiotic spacer placement. Incision and drainage.;  Surgeon: [name], MD;  Location: [facility];  Service: Orthopedics;  Laterality: Left;  ? REVERSE SHOULDER ARTHROPLASTY Left 09/30/2021  ? Procedure: REVERSE SHOULDER ARTHROPLASTY;  Surgeon: [name], MD;  Location: [facility];  Service: Orthopedics;  Laterality: Left;  ? STRABISMUS SURGERY Bilateral 06/10/2017  ? Procedure: REPAIR STRABISMUS BILATERAL;  Surgeon: Everitt Amber, MD;  Location: [place];  Service: Ophthalmology;  Laterality: Bilateral;  ? TONSILLECTOMY  AND ADENOIDECTOMY    ? TOTAL KNEE ARTHROPLASTY Left 2008  ? TOTAL SHOULDER ARTHROPLASTY Left 03/04/2021  ? Procedure: Total Shoulder Arthroplasty;  Surgeon: [name], MD;  Location: [facility];  Service: Orthopedics;  Laterality: Left;  ? ULNAR NERVE TRANSPOSITION Bilateral 1989  ? ? ?There were no vitals filed for this visit. ? ? Subjective Assessment - 04/07/22 1024   ? ? Subjective Patent reports that his triceps pain is gone. He has some tightness in the biceps but is feeling better overall. No pain in the neck area.   ? Currently in Pain? Yes   ? Pain Score 2    ? Pain Location Shoulder   ? Pain Orientation Left   ? Pain Descriptors / Indicators Dull   ? ?  ?  ? ?  ? ? ? ? ? ? ? ? ? ? ? ? ? ? ? ? ? ? ? ? [place] Adult PT Treatment/Exercise - 04/07/22 0001   ? ?  ? Neck Exercises: Seated  ? Neck Retraction 5 reps;10 secs   ? Cervical Rotation Right;Left;5 reps   ? Cervical Rotation Limitations 5-10 sec hold   ? Lateral Flexion Right;Left;5 reps   ? Lateral Flexion Limitations 5-10 sec hold   ?  ? Shoulder Exercises: Supine  ? Other Supine Exercises T stretch ~ 1 min   ? Other Supine Exercises neural mobilization Lt UE ~ 1 min x 2 reps   ?  ? Shoulder Exercises: Seated  ? Other Seated Exercises scap squeeze 10-15 sec hold x 5 reps   ? Other Seated Exercises L x 5 reps   ?  ? Shoulder Exercises: Stretch  ? Other Shoulder Stretches biceps stretch sitting hand ehind body for gentle stretch 20-30 sec x 3 reps   ? Other Shoulder Stretches doorway stretch 30 sec x 2 reps 3 positions; triceps stretch sitting 30 sec x 3 reps   ?  ? Moist Heat Therapy  ? Number Minutes Moist Heat 10 Minutes   ? Moist Heat Location Shoulder   ?  ? Manual Therapy  ? Manual therapy comments skilled palpation to assess response to DN and manual work   ? Soft tissue mobilization deep tissue work through the Lt anterior and lateral shoulder - pecs, biceps, deltoid   ? Myofascial Release anterior shoulder - biceps/deltoid   ? ?  ?  ? ?  ? ? ?  Trigger Point Dry Needling - 04/07/22 0001   ? ? Consent Given? Yes   ? Education Handout Provided Previously provided   ? Dry Needling Comments pec at insertion to humerus avoiding pacemaker placement   ? Pectoralis Major Response Palpable increased muscle length   ? Pectoralis Minor Response Palpable increased muscle length   ? Deltoid Response Palpable increased muscle length   ? Biceps Response Palpable increased  muscle length   ? ?  ?  ? ?  ? ? ? ? ? ? ? ? PT Education - 04/07/22 1029   ? ? Education Details HEP   ? Person(s) Educated Patient   ? Methods Explanation;Demonstration;Tactile cues;Verbal cues;Handout   ? Comprehension Verbalized understanding;Returned demonstration;Verbal cues required;Tactile cues required   ? ?  ?  ? ?  ? ? ? ? ? ? PT Long Term Goals - 04/01/22 1515   ? ?  ? PT LONG TERM GOAL #1  ? Title Improve posture and alignment with patient to demonstrate improved upright posture with posterior shoulder girdle engaged   ? Time 6   ? Status New   ? Target Date 05/13/22   ?  ? PT LONG TERM GOAL #2  ? Title Increase AROM cervical rotation and alteral flexion by 5-7 degrees to improve functional head turns   ? Time 6   ? Period Weeks   ? Status New   ? Target Date 05/13/22   ?  ? PT LONG TERM GOAL #3  ? Title Patient to demonstrate and/or verbalize proper positions and postures for ADL's to avoid irritation of cervical and shoulder pain/problems   ? Time 6   ? Period Weeks   ? Status New   ? Target Date 05/13/22   ?  ? PT LONG TERM GOAL #4  ? Title Independent in [place]   ? Time 6   ? Period Weeks   ? Status New   ? Target Date 05/13/22   ?  ? PT LONG TERM GOAL #5  ? Title Improve functional limitation score to 64   ? Time 6   ? Period Weeks   ? Status New   ? Target Date 05/13/22   ? ?  ?  ? ?  ? ? ? ? ? ? ? ? Plan - 04/07/22 1026   ? ? Clinical Impression Statement Continues to progress well with shoulder rehab with decreased pain and improved mobility. Good response to stretching and dry  needling. Will progress with strengthening as tolerated next week.   ? Rehab Potential Good   ? PT Frequency 2x / week   ? PT Duration 6 weeks   ? PT Treatment/Interventions ADLs/Self Care Home Management;Aquatic Thera

## 2022-04-07 NOTE — Patient Instructions (Signed)
Access Code: [email or web address] ?URL: [email or web address] ?Date: 04/07/2022 ?Prepared by: Gillermo Murdoch ? ?Exercises ?- Seated Cervical Retraction  - 2 x daily - 7 x weekly - 1-2 sets - 5-10 reps - 10 sec  hold ?- Standing Cervical Retraction with Sidebending  - 2 x daily - 7 x weekly - 1 sets - 5-6 reps - 10-15 sec  hold ?- Seated Cervical Retraction and Rotation  - 2 x daily - 7 x weekly - 1 sets - 5-6 reps - 10-15 sec  hold ?- Seated Scapular Retraction  - 2 x daily - 7 x weekly - 1-2 sets - 10 reps - 10 sec  hold ?- Shoulder External Rotation and Scapular Retraction  - 3 x daily - 7 x weekly - 1 sets - 10 reps - 3-5 sec   hold ?- Anterior Shoulder and Biceps Stretch  - 2 x daily - 7 x weekly - 1 sets - 3 reps - 30 sec  hold ?- Standing Overhead Triceps Stretch  - 2 x daily - 7 x weekly - 1 sets - 3 reps - 30 sec  hold ?- Supine Chest Stretch on Foam Roll  - 2 x daily - 7 x weekly - 1 sets - 1 reps - 2-5 min  sec  hold ?- Doorway Pec Stretch at 60 Degrees Abduction  - 3 x daily - 7 x weekly - 1 sets - 3 reps ?- Doorway Pec Stretch at 90 Degrees Abduction  - 3 x daily - 7 x weekly - 1 sets - 3 reps - 30 seconds  hold ?- Doorway Pec Stretch at 120 Degrees Abduction  - 3 x daily - 7 x weekly - 1 sets - 3 reps - 30 second hold  hold ?

## 2022-04-13 ENCOUNTER — Encounter: Payer: Self-pay | Admitting: Rehabilitative and Restorative Service Providers"

## 2022-04-13 ENCOUNTER — Ambulatory Visit: Payer: Medicare Other | Admitting: Rehabilitative and Restorative Service Providers"

## 2022-04-13 DIAGNOSIS — G8929 Other chronic pain: Secondary | ICD-10-CM | POA: Diagnosis not present

## 2022-04-13 DIAGNOSIS — R293 Abnormal posture: Secondary | ICD-10-CM

## 2022-04-13 DIAGNOSIS — M25512 Pain in left shoulder: Secondary | ICD-10-CM | POA: Diagnosis not present

## 2022-04-13 DIAGNOSIS — R29898 Other symptoms and signs involving the musculoskeletal system: Secondary | ICD-10-CM | POA: Diagnosis not present

## 2022-04-13 DIAGNOSIS — M6281 Muscle weakness (generalized): Secondary | ICD-10-CM | POA: Diagnosis not present

## 2022-04-13 DIAGNOSIS — M539 Dorsopathy, unspecified: Secondary | ICD-10-CM | POA: Diagnosis not present

## 2022-04-13 NOTE — Therapy (Signed)
?[facility] ?[place] ?[place], [name], [address] ?Phone: [phone]   Fax:  [phone] ? ?Physical Therapy Treatment ? ?Patient Details  ?Name: Willie Pineda ?MRN: [id number] ?Date of Birth: Aug 09, 1946 ?Referring Provider (PT): Dr [name] ? ? ?Encounter Date: 04/13/2022 ? ? PT End of Session - 04/13/22 1019   ? ? Visit Number 4   ? Number of Visits 12   ? Date for PT Re-Evaluation 05/13/22   ? Progress Note Due on Visit 10   ? PT Start Time 1015   ? PT Stop Time 1105   ? PT Time Calculation (min) 50 min   ? Activity Tolerance Patient tolerated treatment well   ? ?  ?  ? ?  ? ? ?Past Medical History:  ?Diagnosis Date  ? AICD (automatic cardioverter/defibrillator) present   ? Arthritis   ? "hands primarily" (05/05/2016)  ? Atrial fibrillation ([place])   ? Back pain   ? age related  ? CHF (congestive heart failure) ([place])   ? no problemes since 2018  ? Complication of anesthesia   ? Bronchial spasms  ? COPD (chronic obstructive pulmonary disease) ([place])   ? inhalers as needed  ? Coronary artery disease   ? Depression   ? takes Zoloft daily  ? Diabetic peripheral neuropathy ([place]) 01/03/2019  ? Drug rash 11/16/2021  ? Dysrhythmia   ? A Fib-takes Xarelto daily  ? Enlarged prostate   ? benign  ? GERD (gastroesophageal reflux disease)   ? takes Omeprazole daily  ? Gout   ? History of blood clots   ? 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 Hyperlipidemia   ? takes Atorvastatin daily  ? Hypertension   ? takes Metoprolol,HCTZ,and Losartan daily.Takes Clonidine if needed  ? Hyperuricemia 07/14/2021  ? Nocturia   ? Peripheral neuropathy   ? takes Gabapentin daily  ? Peripheral vascular disease ([place])   ? legs blockage   ? Pneumonia ?2015  ? Presence of permanent cardiac pacemaker   ? Restless leg   ? Sleep apnea   ? uses Bipap  ? Stroke [facility]) 2012  ? denies residual on 05/05/2016  ? ? ?Past Surgical History:  ?Procedure Laterality Date  ? AMPUTATION Left  04/03/2021  ? Procedure: LEFT 2ND RAY AMPUTATION;  Surgeon: Newt Minion, MD;  Location: [place];  Service: Orthopedics;  Laterality: Left;  ? CARPAL TUNNEL RELEASE    ? CATARACT EXTRACTION W/ INTRAOCULAR LENS  IMPLANT, BILATERAL Bilateral   ? COLONOSCOPY WITH PROPOFOL N/A 03/09/2017  ? Procedure: COLONOSCOPY WITH PROPOFOL;  Surgeon: Wonda Horner, MD;  Location: [facility] ENDOSCOPY;  Service: Endoscopy;  Laterality: N/A;  ? COLONOSCOPY WITH PROPOFOL N/A 04/03/2020  ? Procedure: COLONOSCOPY WITH PROPOFOL;  Surgeon: Wonda Horner, MD;  [facility];  Service: Endoscopy;  Laterality: N/A;  ? I & D KNEE WITH POLY EXCHANGE Right 05/04/2016  ? IRRIGATION AND DEBRIDEMENT Right KNEE WITH UNICOMPARTMENTAL POLY EXCHANGE  ? I & D KNEE WITH POLY EXCHANGE Right 05/04/2016  ? Procedure: IRRIGATION AND DEBRIDEMENT Right KNEE WITH UNICOMPARTMENTAL POLY EXCHANGE;  Surgeon: Renette Butters, MD;  Location: [place];  Service: Orthopedics;  Laterality: Right;  ? JOINT REPLACEMENT    ? [place]  ? PARTIAL KNEE ARTHROPLASTY Right 04/06/2016  ? Procedure: UNICOMPARTMENTAL RIGHT KNEE;  Surgeon: Renette Butters, MD;  Location: [place];  Service: Orthopedics;  Laterality: Right;  ? POLYPECTOMY  04/03/2020  ?  Procedure: POLYPECTOMY;  Surgeon: Wonda Horner, MD;  [facility];  Service: Endoscopy;;  ? REVERSE SHOULDER ARTHROPLASTY Left 04/29/2021  ? Procedure: Revision antibiotic spacer placement. Incision and drainage.;  Surgeon: [name], MD;  [facility];  Service: Orthopedics;  Laterality: Left;  ? REVERSE SHOULDER ARTHROPLASTY Left 09/30/2021  ? Procedure: REVERSE SHOULDER ARTHROPLASTY;  Surgeon: [name], MD;  [facility];  Service: Orthopedics;  Laterality: Left;  ? STRABISMUS SURGERY Bilateral 06/10/2017  ? Procedure: REPAIR STRABISMUS BILATERAL;  Surgeon: Everitt Amber, MD;  Location: [place];  Service: Ophthalmology;  Laterality: Bilateral;  ? TONSILLECTOMY  AND ADENOIDECTOMY    ? TOTAL KNEE ARTHROPLASTY Left 2008  ? TOTAL SHOULDER ARTHROPLASTY Left 03/04/2021  ? Procedure: Total Shoulder Arthroplasty;  Surgeon: [name], MD;  [facility];  Service: Orthopedics;  Laterality: Left;  ? ULNAR NERVE TRANSPOSITION Bilateral 1989  ? ? ?There were no vitals filed for this visit. ? ? Subjective Assessment - 04/13/22 1020   ? ? Subjective Patient reports that he was doing well until Sunday when he felt severe pain in the Ltshoulderand pain into the Lt arm and some in the Lt hand. Symptoms resolved with rest and topical analgesic cream. Shoulder has felt great the last two days.   ? Currently in Pain? Yes   ? Pain Score 2    ? Pain Location Shoulder   ? Pain Orientation Left   ? Pain Descriptors / Indicators Dull   ? Pain Type Acute pain   ? ?  ?  ? ?  ? ? ? ? ? ? ? ? ? ? ? ? ? ? ? ? ? ? ? ? [place] Adult PT Treatment/Exercise - 04/13/22 0001   ? ?  ? Neck Exercises: Seated  ? Neck Retraction 5 reps;10 secs   ? Cervical Rotation Right;Left;5 reps   ? Cervical Rotation Limitations 5-10 sec hold   ? Lateral Flexion Right;Left;5 reps   ? Lateral Flexion Limitations 5-10 sec hold   ?  ? Shoulder Exercises: Supine  ? Other Supine Exercises T stretch ~ 1 min   ? Other Supine Exercises neural mobilization Lt UE ~ 1 min x 2 reps   ?  ? Shoulder Exercises: Stretch  ? Other Shoulder Stretches biceps stretch sitting hand ehind body for gentle stretch 20-30 sec x 3 reps   ? Other Shoulder Stretches doorway stretch 30 sec x 2 reps 3 positions; triceps stretch sitting 30 sec x 3 reps   ?  ? Moist Heat Therapy  ? Number Minutes Moist Heat 10 Minutes   ? Moist Heat Location Shoulder   ?  ? Manual Therapy  ? Manual therapy comments skilled palpation to assess response to DN and manual work   ? Soft tissue mobilization deep tissue work through the Lt anterior and lateral shoulder - pecs, biceps, deltoid   ? Myofascial Release anterior shoulder - biceps/deltoid   ? ?  ?  ? ?  ? ? ? Trigger  Point Dry Needling - 04/13/22 0001   ? ? Consent Given? Yes   ? Education Handout Provided Previously provided   ? Dry Needling Comments pec at insertion to humerus avoiding pacemaker placement   ? Electrical Stimulation Performed with Dry Needling Yes   ? E-stim with Dry Needling Details mAmp x 5 min   ? Pectoralis Major Response Palpable increased muscle length;Twitch response elicited   ? Pectoralis Minor Response Palpable increased  muscle length;Twitch response elicited   ? Deltoid Response Palpable increased muscle length;Twitch response elicited   ? Biceps Response Palpable increased muscle length;Twitch response elicited   ? ?  ?  ? ?  ? ? ? ? ? ? ? ? ? ? ? ? ? PT Long Term Goals - 04/01/22 1515   ? ?  ? PT LONG TERM GOAL #1  ? Title Improve posture and alignment with patient to demonstrate improved upright posture with posterior shoulder girdle engaged   ? Time 6   ? Status New   ? Target Date 05/13/22   ?  ? PT LONG TERM GOAL #2  ? Title Increase AROM cervical rotation and alteral flexion by 5-7 degrees to improve functional head turns   ? Time 6   ? Period Weeks   ? Status New   ? Target Date 05/13/22   ?  ? PT LONG TERM GOAL #3  ? Title Patient to demonstrate and/or verbalize proper positions and postures for ADL's to avoid irritation of cervical and shoulder pain/problems   ? Time 6   ? Period Weeks   ? Status New   ? Target Date 05/13/22   ?  ? PT LONG TERM GOAL #4  ? Title Independent in [place]   ? Time 6   ? Period Weeks   ? Status New   ? Target Date 05/13/22   ?  ? PT LONG TERM GOAL #5  ? Title Improve functional limitation score to 64   ? Time 6   ? Period Weeks   ? Status New   ? Target Date 05/13/22   ? ?  ?  ? ?  ? ? ? ? ? ? ? ? Plan - 04/13/22 1024   ? ? Clinical Impression Statement Flare up of Lt shoulder pain Sunday likely due to increased stretching by patient. Pain calmed down with rest and OTC topical creams. Patient responds well to DN and manual work. Continued notable decreased tightness  through the anterior Lt shoulder area.   ? Rehab Potential Good   ? PT Frequency 2x / week   ? PT Duration 6 weeks   ? PT Treatment/Interventions ADLs/Self Care Home Management;Aquatic Therapy;Cryotherapy;Ele

## 2022-04-15 ENCOUNTER — Ambulatory Visit: Payer: Medicare Other | Admitting: Rehabilitative and Restorative Service Providers"

## 2022-04-15 ENCOUNTER — Encounter: Payer: Self-pay | Admitting: Rehabilitative and Restorative Service Providers"

## 2022-04-15 DIAGNOSIS — G8929 Other chronic pain: Secondary | ICD-10-CM

## 2022-04-15 DIAGNOSIS — R293 Abnormal posture: Secondary | ICD-10-CM

## 2022-04-15 DIAGNOSIS — M539 Dorsopathy, unspecified: Secondary | ICD-10-CM | POA: Diagnosis not present

## 2022-04-15 DIAGNOSIS — R29898 Other symptoms and signs involving the musculoskeletal system: Secondary | ICD-10-CM

## 2022-04-15 DIAGNOSIS — M25512 Pain in left shoulder: Secondary | ICD-10-CM | POA: Diagnosis not present

## 2022-04-15 DIAGNOSIS — M6281 Muscle weakness (generalized): Secondary | ICD-10-CM | POA: Diagnosis not present

## 2022-04-15 NOTE — Patient Instructions (Signed)
Access Code: [email or web address] ?URL: [email or web address] ?Date: 04/15/2022 ?Prepared by: Gillermo Murdoch ? ?Exercises ?- Seated Cervical Retraction  - 2 x daily - 7 x weekly - 1-2 sets - 5-10 reps - 10 sec  hold ?- Standing Cervical Retraction with Sidebending  - 2 x daily - 7 x weekly - 1 sets - 5-6 reps - 10-15 sec  hold ?- Seated Cervical Retraction and Rotation  - 2 x daily - 7 x weekly - 1 sets - 5-6 reps - 10-15 sec  hold ?- Seated Scapular Retraction  - 2 x daily - 7 x weekly - 1-2 sets - 10 reps - 10 sec  hold ?- Shoulder External Rotation and Scapular Retraction  - 3 x daily - 7 x weekly - 1 sets - 10 reps - 3-5 sec   hold ?- Anterior Shoulder and Biceps Stretch  - 2 x daily - 7 x weekly - 1 sets - 3 reps - 30 sec  hold ?- Standing Overhead Triceps Stretch  - 2 x daily - 7 x weekly - 1 sets - 3 reps - 30 sec  hold ?- Supine Chest Stretch on Foam Roll  - 2 x daily - 7 x weekly - 1 sets - 1 reps - 2-5 min  sec  hold ?- Doorway Pec Stretch at 60 Degrees Abduction  - 3 x daily - 7 x weekly - 1 sets - 3 reps ?- Doorway Pec Stretch at 90 Degrees Abduction  - 3 x daily - 7 x weekly - 1 sets - 3 reps - 30 seconds  hold ?- Doorway Pec Stretch at 120 Degrees Abduction  - 3 x daily - 7 x weekly - 1 sets - 10 reps - 30 second hold  hold ?- Seated Shoulder Row with Anchored Resistance  - 1 x daily - 7 x weekly - 1-3 sets - 10 reps - 3-5 sec  hold ?- Seated High Shoulder Row with Anchored Resistance  - 1 x daily - 7 x weekly - 1-3 sets - 10 reps - 3-5 sec  hold ?- Seated Shoulder Extension and Scapular Retraction with Resistance  - 2 x daily - 7 x weekly - 1-3 sets - 10 reps - 3-5 sec  hold ?- Seated Shoulder Internal Rotation with Anchored Resistance  - 1 x daily - 7 x weekly - 1-3 sets - 10 reps - 3-5 sec  hold ?- Seated Shoulder External Rotation with Resistance  - 1 x daily - 7 x weekly - 1-3 sets - 10 reps - 3-5 sec  hold ?

## 2022-04-15 NOTE — Therapy (Signed)
[place] [facility] ?[place], [name], [address] ?Phone: [phone]   Fax:  [phone] ? ?Physical Therapy Treatment ? ?Patient Details  ?Name: Willie Pineda ?MRN: [id number] ?Date of Birth: 11-03-1946 ?Referring Provider (PT): Dr [name] ? ? ?Encounter Date: 04/15/2022 ? ? PT End of Session - 04/15/22 1155   ? ? Visit Number 5   ? Number of Visits 12   ? Date for PT Re-Evaluation 05/13/22   ? Progress Note Due on Visit 10   ? PT Start Time 1145   ? PT Stop Time 1233   ? PT Time Calculation (min) 48 min   ? Activity Tolerance Patient tolerated treatment well   ? ?  ?  ? ?  ? ? ?Past Medical History:  ?Diagnosis Date  ? AICD (automatic cardioverter/defibrillator) present   ? Arthritis   ? "hands primarily" (05/05/2016)  ? Atrial fibrillation ([place])   ? Back pain   ? age related  ? CHF (congestive heart failure) ([place])   ? no problemes since 2018  ? Complication of anesthesia   ? Bronchial spasms  ? COPD (chronic obstructive pulmonary disease) ([place])   ? inhalers as needed  ? Coronary artery disease   ? Depression   ? takes Zoloft daily  ? Diabetic peripheral neuropathy ([place]) 01/03/2019  ? Drug rash 11/16/2021  ? Dysrhythmia   ? A Fib-takes Xarelto daily  ? Enlarged prostate   ? benign  ? GERD (gastroesophageal reflux disease)   ? takes Omeprazole daily  ? Gout   ? History of blood clots   ? 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 Hyperlipidemia   ? takes Atorvastatin daily  ? Hypertension   ? takes Metoprolol,HCTZ,and Losartan daily.Takes Clonidine if needed  ? Hyperuricemia 07/14/2021  ? Nocturia   ? Peripheral neuropathy   ? takes Gabapentin daily  ? Peripheral vascular disease ([place])   ? legs blockage   ? Pneumonia ?2015  ? Presence of permanent cardiac pacemaker   ? Restless leg   ? Sleep apnea   ? uses Bipap  ? Stroke [facility]) 2012  ? denies residual on 05/05/2016  ? ? ?Past Surgical History:  ?Procedure Laterality Date  ? AMPUTATION Left  04/03/2021  ? Procedure: LEFT 2ND RAY AMPUTATION;  Surgeon: Newt Minion, MD;  Location: [place];  Service: Orthopedics;  Laterality: Left;  ? CARPAL TUNNEL RELEASE    ? CATARACT EXTRACTION W/ INTRAOCULAR LENS  IMPLANT, BILATERAL Bilateral   ? COLONOSCOPY WITH PROPOFOL N/A 03/09/2017  ? Procedure: COLONOSCOPY WITH PROPOFOL;  Surgeon: Wonda Horner, MD;  Location: [facility] ENDOSCOPY;  Service: Endoscopy;  Laterality: N/A;  ? COLONOSCOPY WITH PROPOFOL N/A 04/03/2020  ? Procedure: COLONOSCOPY WITH PROPOFOL;  Surgeon: Wonda Horner, MD;  [facility]: [facility];  Service: Endoscopy;  Laterality: N/A;  ? I & D KNEE WITH POLY EXCHANGE Right 05/04/2016  ? IRRIGATION AND DEBRIDEMENT Right KNEE WITH UNICOMPARTMENTAL POLY EXCHANGE  ? I & D KNEE WITH POLY EXCHANGE Right 05/04/2016  ? Procedure: IRRIGATION AND DEBRIDEMENT Right KNEE WITH UNICOMPARTMENTAL POLY EXCHANGE;  Surgeon: Renette Butters, MD;  Location: [place];  Service: Orthopedics;  Laterality: Right;  ? JOINT REPLACEMENT    ? [place]  ? PARTIAL KNEE ARTHROPLASTY Right 04/06/2016  ? Procedure: UNICOMPARTMENTAL RIGHT KNEE;  Surgeon: Renette Butters, MD;  Location: [place];  Service: Orthopedics;  Laterality: Right;  ? POLYPECTOMY  04/03/2020  ?  Procedure: POLYPECTOMY;  Surgeon: Wonda Horner, MD;  Location: [facility];  Service: Endoscopy;;  ? REVERSE SHOULDER ARTHROPLASTY Left 04/29/2021  ? Procedure: Revision antibiotic spacer placement. Incision and drainage.;  Surgeon: [name], MD;  Location: [facility];  Service: Orthopedics;  Laterality: Left;  ? REVERSE SHOULDER ARTHROPLASTY Left 09/30/2021  ? Procedure: REVERSE SHOULDER ARTHROPLASTY;  Surgeon: [name], MD;  [facility]: [facility];  Service: Orthopedics;  Laterality: Left;  ? STRABISMUS SURGERY Bilateral 06/10/2017  ? Procedure: REPAIR STRABISMUS BILATERAL;  Surgeon: Everitt Amber, MD;  Location: [place];  Service: Ophthalmology;  Laterality: Bilateral;  ? TONSILLECTOMY  AND ADENOIDECTOMY    ? TOTAL KNEE ARTHROPLASTY Left 2008  ? TOTAL SHOULDER ARTHROPLASTY Left 03/04/2021  ? Procedure: Total Shoulder Arthroplasty;  Surgeon: [name], MD;  [facility]: [facility];  Service: Orthopedics;  Laterality: Left;  ? ULNAR NERVE TRANSPOSITION Bilateral 1989  ? ? ?There were no vitals filed for this visit. ? ? Subjective Assessment - 04/15/22 1237   ? ? Subjective Shoulder is feeling better with less pain and more movement. No longer having radicular pain.   ? Currently in Pain? No/denies   ? Pain Score 0-No pain   ? Pain Location Shoulder   ? Pain Orientation Left   ? ?  ?  ? ?  ? ? ? ? ? ? ? ? ? ? ? ? ? ? ? ? ? ? ? ? [place] Adult PT Treatment/Exercise - 04/15/22 0001   ? ?  ? Neck Exercises: Seated  ? Neck Retraction 5 reps;10 secs   ?  ? Shoulder Exercises: Seated  ? Extension Strengthening;Both;10 reps;Theraband   ? Theraband Level (Shoulder Extension) Level 3 (Green)   ? Row Strengthening;Both;10 reps;Theraband   ? Theraband Level (Shoulder Row) Level 3 (Green)   ? Row Limitations repeated Lt UE only high row green TB x 10 reps   ? External Rotation Strengthening;Left;10 reps;Theraband   ? Theraband Level (Shoulder External Rotation) Level 2 (Red)   ? Internal Rotation Strengthening;Left;10 reps;Theraband   ? Theraband Level (Shoulder Internal Rotation) Level 2 (Red)   ?  ? Shoulder Exercises: Pulleys  ? Flexion 3 minutes   ?  ? Shoulder Exercises: Stretch  ? Other Shoulder Stretches biceps stretch sitting hand ehind body for gentle stretch 20-30 sec x 3 reps   ? Other Shoulder Stretches doorway stretch 30 sec x 2 reps 3 positions; triceps stretch sitting 30 sec x 3 reps   ?  ? Moist Heat Therapy  ? Number Minutes Moist Heat 10 Minutes   ? Moist Heat Location Shoulder   ?  ? Manual Therapy  ? Manual therapy comments skilled palpation to assess response to DN and manual work   ? Soft tissue mobilization deep tissue work through the Lt anterior and lateral shoulder - pecs, biceps, deltoid   ?  Myofascial Release anterior shoulder - biceps/deltoid   ? ?  ?  ? ?  ? ? ? Trigger Point Dry Needling - 04/15/22 0001   ? ? Consent Given? Yes   ? Education Handout Provided Previously provided   ? Dry Needling Comments pec at insertion to humerus avoiding pacemaker placement   ? Electrical Stimulation Performed with Dry Needling Yes   ? E-stim with Dry Needling Details mAmp x 5 min   ? Other Dry Needling needle threading for scar anterior Lt shoulder   ? Pectoralis Major Response Palpable increased muscle length;Twitch response elicited   ?  Pectoralis Minor Response Palpable increased muscle length;Twitch response elicited   ? Deltoid Response Palpable increased muscle length;Twitch response elicited   ? Biceps Response Palpable increased muscle length;Twitch response elicited   ? ?  ?  ? ?  ? ? ? ? ? ? ? ? PT Education - 04/15/22 1211   ? ? Education Details HEP   ? Person(s) Educated Patient   ? Methods Explanation;Demonstration;Tactile cues;Verbal cues;Handout   ? Comprehension Verbalized understanding;Returned demonstration;Verbal cues required;Tactile cues required   ? ?  ?  ? ?  ? ? ? ? ? ? PT Long Term Goals - 04/01/22 1515   ? ?  ? PT LONG TERM GOAL #1  ? Title Improve posture and alignment with patient to demonstrate improved upright posture with posterior shoulder girdle engaged   ? Time 6   ? Status New   ? Target Date 05/13/22   ?  ? PT LONG TERM GOAL #2  ? Title Increase AROM cervical rotation and alteral flexion by 5-7 degrees to improve functional head turns   ? Time 6   ? Period Weeks   ? Status New   ? Target Date 05/13/22   ?  ? PT LONG TERM GOAL #3  ? Title Patient to demonstrate and/or verbalize proper positions and postures for ADL's to avoid irritation of cervical and shoulder pain/problems   ? Time 6   ? Period Weeks   ? Status New   ? Target Date 05/13/22   ?  ? PT LONG TERM GOAL #4  ? Title Independent in [place]   ? Time 6   ? Period Weeks   ? Status New   ? Target Date 05/13/22   ?  ? PT LONG  TERM GOAL #5  ? Title Improve functional limitation score to 64   ? Time 6   ? Period Weeks   ? Status New   ? Target Date 05/13/22   ? ?  ?  ? ?  ? ? ? ? ? ? ? ? Plan - 04/15/22 1159   ? ? Clinical Impres

## 2022-04-19 DIAGNOSIS — H04123 Dry eye syndrome of bilateral lacrimal glands: Secondary | ICD-10-CM | POA: Diagnosis not present

## 2022-04-19 DIAGNOSIS — H401131 Primary open-angle glaucoma, bilateral, mild stage: Secondary | ICD-10-CM | POA: Diagnosis not present

## 2022-04-20 ENCOUNTER — Ambulatory Visit: Payer: Medicare Other | Admitting: Rehabilitative and Restorative Service Providers"

## 2022-04-20 ENCOUNTER — Encounter: Payer: Self-pay | Admitting: Rehabilitative and Restorative Service Providers"

## 2022-04-20 DIAGNOSIS — G8929 Other chronic pain: Secondary | ICD-10-CM

## 2022-04-20 DIAGNOSIS — M539 Dorsopathy, unspecified: Secondary | ICD-10-CM | POA: Diagnosis not present

## 2022-04-20 DIAGNOSIS — M6281 Muscle weakness (generalized): Secondary | ICD-10-CM

## 2022-04-20 DIAGNOSIS — M25512 Pain in left shoulder: Secondary | ICD-10-CM | POA: Diagnosis not present

## 2022-04-20 DIAGNOSIS — R293 Abnormal posture: Secondary | ICD-10-CM

## 2022-04-20 DIAGNOSIS — R29898 Other symptoms and signs involving the musculoskeletal system: Secondary | ICD-10-CM

## 2022-04-20 NOTE — Therapy (Signed)
?[facility] ?[place], [name], [address] ?Phone: [phone]   Fax:  [phone] ? ?Physical Therapy Treatment ? ?Patient Details  ?Name: Willie Pineda ?MRN: [id number] ?Date of Birth: 30-Jul-1946 ?Referring Provider (PT): Dr [name] ? ? ?Encounter Date: 04/20/2022 ? ? PT End of Session - 04/20/22 1015   ? ? Visit Number 6   ? Number of Visits 12   ? Date for PT Re-Evaluation 05/13/22   ? Progress Note Due on Visit 10   ? PT Start Time 1015   ? PT Stop Time 1104   ? PT Time Calculation (min) 49 min   ? Activity Tolerance Patient tolerated treatment well   ? ?  ?  ? ?  ? ? ?Past Medical History:  ?Diagnosis Date  ? AICD (automatic cardioverter/defibrillator) present   ? Arthritis   ? "hands primarily" (05/05/2016)  ? Atrial fibrillation ([place])   ? Back pain   ? age related  ? CHF (congestive heart failure) ([place])   ? no problemes since 2018  ? Complication of anesthesia   ? Bronchial spasms  ? COPD (chronic obstructive pulmonary disease) ([place])   ? inhalers as needed  ? Coronary artery disease   ? Depression   ? takes Zoloft daily  ? Diabetic peripheral neuropathy ([place]) 01/03/2019  ? Drug rash 11/16/2021  ? Dysrhythmia   ? A Fib-takes Xarelto daily  ? Enlarged prostate   ? benign  ? GERD (gastroesophageal reflux disease)   ? takes Omeprazole daily  ? Gout   ? History of blood clots   ? 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 Hyperlipidemia   ? takes Atorvastatin daily  ? Hypertension   ? takes Metoprolol,HCTZ,and Losartan daily.Takes Clonidine if needed  ? Hyperuricemia 07/14/2021  ? Nocturia   ? Peripheral neuropathy   ? takes Gabapentin daily  ? Peripheral vascular disease ([place])   ? legs blockage   ? Pneumonia ?2015  ? Presence of permanent cardiac pacemaker   ? Restless leg   ? Sleep apnea   ? uses Bipap  ? Stroke [facility]) 2012  ? denies residual on 05/05/2016  ? ? ?Past Surgical History:  ?Procedure Laterality Date  ? AMPUTATION Left  04/03/2021  ? Procedure: LEFT 2ND RAY AMPUTATION;  Surgeon: Newt Minion, MD;  Location: [place];  Service: Orthopedics;  Laterality: Left;  ? CARPAL TUNNEL RELEASE    ? CATARACT EXTRACTION W/ INTRAOCULAR LENS  IMPLANT, BILATERAL Bilateral   ? COLONOSCOPY WITH PROPOFOL N/A 03/09/2017  ? Procedure: COLONOSCOPY WITH PROPOFOL;  Surgeon: Wonda Horner, MD;  [facility]COPY;  Service: Endoscopy;  Laterality: N/A;  ? COLONOSCOPY WITH PROPOFOL N/A 04/03/2020  ? Procedure: COLONOSCOPY WITH PROPOFOL;  Surgeon: Wonda Horner, MD;  [facility];  Service: Endoscopy;  Laterality: N/A;  ? I & D KNEE WITH POLY EXCHANGE Right 05/04/2016  ? IRRIGATION AND DEBRIDEMENT Right KNEE WITH UNICOMPARTMENTAL POLY EXCHANGE  ? I & D KNEE WITH POLY EXCHANGE Right 05/04/2016  ? Procedure: IRRIGATION AND DEBRIDEMENT Right KNEE WITH UNICOMPARTMENTAL POLY EXCHANGE;  Surgeon: Renette Butters, MD;  Location: [place];  Service: Orthopedics;  Laterality: Right;  ? JOINT REPLACEMENT    ? [place]  ? PARTIAL KNEE ARTHROPLASTY Right 04/06/2016  ? Procedure: UNICOMPARTMENTAL RIGHT KNEE;  Surgeon: Renette Butters, MD;  Location: [place];  Service: Orthopedics;  Laterality: Right;  ? POLYPECTOMY  04/03/2020  ?  Procedure: POLYPECTOMY;  Surgeon: Wonda Horner, MD;  [facility];  Service: Endoscopy;;  ? REVERSE SHOULDER ARTHROPLASTY Left 04/29/2021  ? Procedure: Revision antibiotic spacer placement. Incision and drainage.;  Surgeon: [name], MD;  [facility];  Service: Orthopedics;  Laterality: Left;  ? REVERSE SHOULDER ARTHROPLASTY Left 09/30/2021  ? Procedure: REVERSE SHOULDER ARTHROPLASTY;  Surgeon: [name], MD;  [facility];  Service: Orthopedics;  Laterality: Left;  ? STRABISMUS SURGERY Bilateral 06/10/2017  ? Procedure: REPAIR STRABISMUS BILATERAL;  Surgeon: Everitt Amber, MD;  Location: [place];  Service: Ophthalmology;  Laterality: Bilateral;  ? TONSILLECTOMY  AND ADENOIDECTOMY    ? TOTAL KNEE ARTHROPLASTY Left 2008  ? TOTAL SHOULDER ARTHROPLASTY Left 03/04/2021  ? Procedure: Total Shoulder Arthroplasty;  Surgeon: [name], MD;  [facility];  Service: Orthopedics;  Laterality: Left;  ? ULNAR NERVE TRANSPOSITION Bilateral 1989  ? ? ?There were no vitals filed for this visit. ? ? Subjective Assessment - 04/20/22 1021   ? ? Subjective Continues to feel better. Can tell a big difference in the shoulder and arm from last week to this week. Working on the exercises at home. Can tell he has better range with functional activities.   ? Currently in Pain? No/denies   ? Pain Score 0-No pain   ? ?  ?  ? ?  ? ? ? ? ? ? ? ? ? ? ? ? ? ? ? ? ? ? ? ? [place] Adult PT Treatment/Exercise - 04/20/22 0001   ? ?  ? Shoulder Exercises: Seated  ? Extension Strengthening;Both;10 reps;Theraband   ? Theraband Level (Shoulder Extension) Level 3 (Green)   ? Row Strengthening;Both;10 reps;Theraband   ? Theraband Level (Shoulder Row) Level 4 (Blue)   ? Row Limitations high row green TB x 10 reps   ? External Rotation Strengthening;Left;10 reps;Theraband   ? Theraband Level (Shoulder External Rotation) Level 2 (Red)   ? Internal Rotation Strengthening;Left;10 reps;Theraband   ? Theraband Level (Shoulder Internal Rotation) Level 2 (Red)   ? ABduction Limitations shoulder abd overhead shds 90 deg elbows 90 deg pushing hands overhead   ?  ? Shoulder Exercises: Pulleys  ? Flexion 3 minutes   ? Other Pulley Exercises ER/IR hands even on pulley handles   ?  ? Shoulder Exercises: Stretch  ? Other Shoulder Stretches biceps stretch sitting hand ehind body for gentle stretch 20-30 sec x 3 reps   ? Other Shoulder Stretches doorway stretch 30 sec x 2 reps 3 positions; triceps stretch sitting 30 sec x 3 reps   ?  ? Moist Heat Therapy  ? Number Minutes Moist Heat 10 Minutes   ? Moist Heat Location Shoulder   ?  ? Manual Therapy  ? Manual therapy comments skilled palpation to assess response to DN and manual  work   ? Soft tissue mobilization deep tissue work through the Lt anterior and lateral shoulder - pecs, biceps, deltoid   ? Myofascial Release anterior shoulder - biceps/deltoid   ? ?  ?  ? ?  ? ? ? Trigger Point Dry Needling - 04/20/22 0001   ? ? Consent Given? Yes   ? Education Handout Provided Previously provided   ? Dry Needling Comments pec at insertion to humerus avoiding pacemaker placement   ? Electrical Stimulation Performed with Dry Needling Yes   ? E-stim with Dry Needling Details mAmp x 5 min   ? Other Dry Needling needle threading  for scar anterior Lt shoulder   ? Pectoralis Major Response Palpable increased muscle length;Twitch response elicited   ? Pectoralis Minor Response Palpable increased muscle length;Twitch response elicited   ? Deltoid Response Palpable increased muscle length;Twitch response elicited   ? Biceps Response Palpable increased muscle length;Twitch response elicited   ? ?  ?  ? ?  ? ? ? ? ? ? ? ? PT Education - 04/20/22 1042   ? ? Education Details HEP   ? Person(s) Educated Patient   ? Methods Explanation;Demonstration;Tactile cues;Verbal cues;Handout   ? Comprehension Verbalized understanding;Returned demonstration;Verbal cues required;Tactile cues required   ? ?  ?  ? ?  ? ? ? ? ? ? PT Long Term Goals - 04/01/22 1515   ? ?  ? PT LONG TERM GOAL #1  ? Title Improve posture and alignment with patient to demonstrate improved upright posture with posterior shoulder girdle engaged   ? Time 6   ? Status New   ? Target Date 05/13/22   ?  ? PT LONG TERM GOAL #2  ? Title Increase AROM cervical rotation and alteral flexion by 5-7 degrees to improve functional head turns   ? Time 6   ? Period Weeks   ? Status New   ? Target Date 05/13/22   ?  ? PT LONG TERM GOAL #3  ? Title Patient to demonstrate and/or verbalize proper positions and postures for ADL's to avoid irritation of cervical and shoulder pain/problems   ? Time 6   ? Period Weeks   ? Status New   ? Target Date 05/13/22   ?  ? PT LONG  TERM GOAL #4  ? Title Independent in [place]   ? Time 6   ? Period Weeks   ? Status New   ? Target Date 05/13/22   ?  ? PT LONG TERM GOAL #5  ? Title Improve functional limitation score to 64   ? Time 6   ? Per

## 2022-04-20 NOTE — Patient Instructions (Signed)
Access Code: [email or web address] ?URL: [email or web address] ?Date: 04/20/2022 ?Prepared by: Gillermo Murdoch ? ?Exercises ?- Seated Cervical Retraction  - 2 x daily - 7 x weekly - 1-2 sets - 5-10 reps - 10 sec  hold ?- Standing Cervical Retraction with Sidebending  - 2 x daily - 7 x weekly - 1 sets - 5-6 reps - 10-15 sec  hold ?- Seated Cervical Retraction and Rotation  - 2 x daily - 7 x weekly - 1 sets - 5-6 reps - 10-15 sec  hold ?- Seated Scapular Retraction  - 2 x daily - 7 x weekly - 1-2 sets - 10 reps - 10 sec  hold ?- Shoulder External Rotation and Scapular Retraction  - 3 x daily - 7 x weekly - 1 sets - 10 reps - 3-5 sec   hold ?- Anterior Shoulder and Biceps Stretch  - 2 x daily - 7 x weekly - 1 sets - 3 reps - 30 sec  hold ?- Standing Overhead Triceps Stretch  - 2 x daily - 7 x weekly - 1 sets - 3 reps - 30 sec  hold ?- Supine Chest Stretch on Foam Roll  - 2 x daily - 7 x weekly - 1 sets - 1 reps - 2-5 min  sec  hold ?- Doorway Pec Stretch at 60 Degrees Abduction  - 3 x daily - 7 x weekly - 1 sets - 3 reps ?- Doorway Pec Stretch at 90 Degrees Abduction  - 3 x daily - 7 x weekly - 1 sets - 3 reps - 30 seconds  hold ?- Doorway Pec Stretch at 120 Degrees Abduction  - 3 x daily - 7 x weekly - 1 sets - 10 reps - 30 second hold  hold ?- Seated Shoulder Row with Anchored Resistance  - 1 x daily - 7 x weekly - 1-3 sets - 10 reps - 3-5 sec  hold ?- Seated High Shoulder Row with Anchored Resistance  - 1 x daily - 7 x weekly - 1-3 sets - 10 reps - 3-5 sec  hold ?- Seated Shoulder Extension and Scapular Retraction with Resistance  - 2 x daily - 7 x weekly - 1-3 sets - 10 reps - 3-5 sec  hold ?- Seated Shoulder Internal Rotation with Anchored Resistance  - 1 x daily - 7 x weekly - 1-3 sets - 10 reps - 3-5 sec  hold ?- Seated Shoulder External Rotation with Resistance  - 1 x daily - 7 x weekly - 1-3 sets - 10 reps - 3-5 sec  hold ?- Shoulder External Rotation in Abduction with Anchored Resistance  - 1 x daily - 7 x weekly  - 1-3 sets - 10 reps - 3 sec  hold ?- S[place]  - 1 x daily - 7 x weekly - 1-3 sets - 10 reps - 3 sec  hold ?

## 2022-04-22 ENCOUNTER — Ambulatory Visit: Payer: Medicare Other | Admitting: Rehabilitative and Restorative Service Providers"

## 2022-04-22 ENCOUNTER — Encounter: Payer: Self-pay | Admitting: Rehabilitative and Restorative Service Providers"

## 2022-04-22 DIAGNOSIS — R293 Abnormal posture: Secondary | ICD-10-CM | POA: Diagnosis not present

## 2022-04-22 DIAGNOSIS — M539 Dorsopathy, unspecified: Secondary | ICD-10-CM

## 2022-04-22 DIAGNOSIS — M6281 Muscle weakness (generalized): Secondary | ICD-10-CM | POA: Diagnosis not present

## 2022-04-22 DIAGNOSIS — R29898 Other symptoms and signs involving the musculoskeletal system: Secondary | ICD-10-CM | POA: Diagnosis not present

## 2022-04-22 DIAGNOSIS — G8929 Other chronic pain: Secondary | ICD-10-CM

## 2022-04-22 DIAGNOSIS — M25512 Pain in left shoulder: Secondary | ICD-10-CM

## 2022-04-22 NOTE — Patient Instructions (Signed)
Access Code: [email or web address] URL: [email or web address] Date: 04/22/2022 Prepared by: Gillermo Murdoch  Exercises - Seated Cervical Retraction  - 2 x daily - 7 x weekly - 1-2 sets - 5-10 reps - 10 sec  hold - Standing Cervical Retraction with Sidebending  - 2 x daily - 7 x weekly - 1 sets - 5-6 reps - 10-15 sec  hold - Seated Cervical Retraction and Rotation  - 2 x daily - 7 x weekly - 1 sets - 5-6 reps - 10-15 sec  hold - Seated Scapular Retraction  - 2 x daily - 7 x weekly - 1-2 sets - 10 reps - 10 sec  hold - Shoulder External Rotation and Scapular Retraction  - 3 x daily - 7 x weekly - 1 sets - 10 reps - 3-5 sec   hold - Anterior Shoulder and Biceps Stretch  - 2 x daily - 7 x weekly - 1 sets - 3 reps - 30 sec  hold - Standing Overhead Triceps Stretch  - 2 x daily - 7 x weekly - 1 sets - 3 reps - 30 sec  hold - Supine Chest Stretch on Foam Roll  - 2 x daily - 7 x weekly - 1 sets - 1 reps - 2-5 min  sec  hold - Doorway Pec Stretch at 60 Degrees Abduction  - 3 x daily - 7 x weekly - 1 sets - 3 reps - Doorway Pec Stretch at 90 Degrees Abduction  - 3 x daily - 7 x weekly - 1 sets - 3 reps - 30 seconds  hold - Doorway Pec Stretch at 120 Degrees Abduction  - 3 x daily - 7 x weekly - 1 sets - 10 reps - 30 second hold  hold - Seated Shoulder Row with Anchored Resistance  - 1 x daily - 7 x weekly - 1-3 sets - 10 reps - 3-5 sec  hold - Seated High Shoulder Row with Anchored Resistance  - 1 x daily - 7 x weekly - 1-3 sets - 10 reps - 3-5 sec  hold - Seated Shoulder Extension and Scapular Retraction with Resistance  - 2 x daily - 7 x weekly - 1-3 sets - 10 reps - 3-5 sec  hold - Seated Shoulder Internal Rotation with Anchored Resistance  - 1 x daily - 7 x weekly - 1-3 sets - 10 reps - 3-5 sec  hold - Seated Shoulder External Rotation with Resistance  - 1 x daily - 7 x weekly - 1-3 sets - 10 reps - 3-5 sec  hold - Shoulder External Rotation in Abduction with Anchored Resistance  - 1 x daily - 7 x weekly  - 1-3 sets - 10 reps - 3 sec  hold - [place]  - 1 x daily - 7 x weekly - 1-3 sets - 10 reps - 3 sec  hold - Shoulder W - External Rotation with Resistance  - 1 x daily - 7 x weekly - 1-2 sets - 10 reps - 3 sec  hold

## 2022-04-22 NOTE — Therapy (Signed)
[place], [name], [address] Phone: [phone]   Fax:  [phone]  Physical Therapy Treatment  Patient Details  Name: Willie Pineda MRN: [id number] Date of Birth: 09-05-1946 Referring Provider (PT): Dr [name]   Encounter Date: 04/22/2022   PT End of Session - 04/22/22 1111     Visit Number 7    Number of Visits 12    Date for PT Re-Evaluation 05/13/22    Progress Note Due on Visit 10    PT Start Time 1100    PT Stop Time 1148    PT Time Calculation (min) 48 min    Activity Tolerance Patient tolerated treatment well             Past Medical History:  Diagnosis Date   AICD (automatic cardioverter/defibrillator) present    Arthritis    "hands primarily" (05/05/2016)   Atrial fibrillation ([place])    Back pain    age related   CHF (congestive heart failure) ([place])    no problemes since 1062   Complication of anesthesia    Bronchial spasms   COPD (chronic obstructive pulmonary disease) (HCC)    inhalers as needed   Coronary artery disease    Depression    takes Zoloft daily   Diabetic peripheral neuropathy ([place]) 01/03/2019   Drug rash 11/16/2021   Dysrhythmia    A Fib-takes Xarelto daily   Enlarged prostate    benign   GERD (gastroesophageal reflux disease)    takes Omeprazole daily   Gout    History of blood clots    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)   Hyperlipidemia    takes Atorvastatin daily   Hypertension    takes Metoprolol,HCTZ,and Losartan daily.Takes Clonidine if needed   Hyperuricemia 07/14/2021   Nocturia    Peripheral neuropathy    takes Gabapentin daily   Peripheral vascular disease (HCC)    legs blockage    Pneumonia ?2015   Presence of permanent cardiac pacemaker    Restless leg    Sleep apnea    uses Bipap   Stroke [facility]) 2012   denies residual on 05/05/2016    Past Surgical History:  Procedure Laterality Date   AMPUTATION Left  04/03/2021   Procedure: LEFT 2ND RAY AMPUTATION;  Surgeon: Newt Minion, MD;  Location: [place];  Service: Orthopedics;  Laterality: Left;   CARPAL TUNNEL RELEASE     CATARACT EXTRACTION W/ INTRAOCULAR LENS  IMPLANT, BILATERAL Bilateral    COLONOSCOPY WITH PROPOFOL N/A 03/09/2017   Procedure: COLONOSCOPY WITH PROPOFOL;  Surgeon: Wonda Horner, MD;  Location: [facility] ENDOSCOPY;  Service: Endoscopy;  Laterality: N/A;   COLONOSCOPY WITH PROPOFOL N/A 04/03/2020   Procedure: COLONOSCOPY WITH PROPOFOL;  Surgeon: Wonda Horner, MD;  [facility]OS[facility];  Service: Endoscopy;  Laterality: N/A;   I & D KNEE WITH POLY EXCHANGE Right 05/04/2016   IRRIGATION AND DEBRIDEMENT Right KNEE WITH UNICOMPARTMENTAL POLY EXCHANGE   I & D KNEE WITH POLY EXCHANGE Right 05/04/2016   Procedure: IRRIGATION AND DEBRIDEMENT Right KNEE WITH UNICOMPARTMENTAL POLY EXCHANGE;  Surgeon: Renette Butters, MD;  Location: [place];  Service: Orthopedics;  Laterality: Right;   JOINT REPLACEMENT     LAPAROSCOPIC CHOLECYSTECTOMY  1995   PARTIAL KNEE ARTHROPLASTY Right 04/06/2016   Procedure: UNICOMPARTMENTAL RIGHT KNEE;  Surgeon: Renette Butters, MD;  Location: [place];  Service: Orthopedics;  Laterality: Right;   POLYPECTOMY  04/03/2020  Procedure: POLYPECTOMY;  Surgeon: Wonda Horner, MD;  [facility];  Service: Endoscopy;;   REVERSE SHOULDER ARTHROPLASTY Left 04/29/2021   Procedure: Revision antibiotic spacer placement. Incision and drainage.;  Surgeon: [name], MD;  [facility];  Service: Orthopedics;  Laterality: Left;   REVERSE SHOULDER ARTHROPLASTY Left 09/30/2021   Procedure: REVERSE SHOULDER ARTHROPLASTY;  Surgeon: [name], MD;  [facility];  Service: Orthopedics;  Laterality: Left;   STRABISMUS SURGERY Bilateral 06/10/2017   Procedure: REPAIR STRABISMUS BILATERAL;  Surgeon: Everitt Amber, MD;  Location: [place];  Service: Ophthalmology;  Laterality: Bilateral;   TONSILLECTOMY  AND ADENOIDECTOMY     TOTAL KNEE ARTHROPLASTY Left 2008   TOTAL SHOULDER ARTHROPLASTY Left 03/04/2021   Procedure: Total Shoulder Arthroplasty;  Surgeon: [name], MD;  [facility];  Service: Orthopedics;  Laterality: Left;   ULNAR NERVE TRANSPOSITION Bilateral 1989    There were no vitals filed for this visit.   Subjective Assessment - 04/22/22 1112     Subjective Shoulder feels good. No pain and getting stronger. Working on exercises at home.    Currently in Pain? No/denies    Pain Score 0-No pain    Pain Location Shoulder                               [facility] Adult PT Treatment/Exercise - 04/22/22 0001       Shoulder Exercises: Seated   Extension Strengthening;Both;10 reps;Theraband    Theraband Level (Shoulder Extension) Level 3 (Green)    Row Strengthening;Both;10 reps;Theraband    Theraband Level (Shoulder Row) Level 4 (Blue)    Row Limitations high row green TB x 10 reps    External Rotation Strengthening;Left;10 reps;Theraband    Theraband Level (Shoulder External Rotation) Level 2 (Red)    External Rotation Limitations high row to ER yellow TB x 10 x 2 sets    Internal Rotation Strengthening;Left;10 reps;Theraband    Theraband Level (Shoulder Internal Rotation) Level 2 (Red)    ABduction Limitations shoulder abd overhead shds 90 deg elbows 90 deg pushing hands overhead      Shoulder Exercises: Pulleys   Flexion 3 minutes    Other Pulley Exercises ER/IR hands even on pulley handles      Moist Heat Therapy   Number Minutes Moist Heat 10 Minutes    Moist Heat Location Shoulder      Manual Therapy   Manual therapy comments skilled palpation to assess response to DN and manual work    Soft tissue mobilization deep tissue work through the Lt anterior and lateral shoulder - pecs, biceps, deltoid    Myofascial Release anterior shoulder - biceps/deltoid              Trigger Point Dry Needling - 04/22/22 0001     Consent Given? Yes     Education Handout Provided Previously provided    Dry Needling Comments pec at insertion to humerus avoiding pacemaker placement    Electrical Stimulation Performed with Dry Needling Yes    E-stim with Dry Needling Details mAmp x 5 min    Other Dry Needling needle threading for scar anterior Lt shoulder    Pectoralis Major Response Palpable increased muscle length;Twitch response elicited    Pectoralis Minor Response Palpable increased muscle length;Twitch response elicited    Deltoid Response Palpable increased muscle length;Twitch response elicited    Biceps Response Palpable increased muscle length;Twitch  response elicited                   PT Education - 04/22/22 1124     Education Details HEP    Person(s) Educated Patient    Methods Explanation;Demonstration;Tactile cues;Verbal cues;Handout    Comprehension Verbalized understanding;Returned demonstration;Verbal cues required;Tactile cues required                 PT Long Term Goals - 04/01/22 1515       PT LONG TERM GOAL #1   Title Improve posture and alignment with patient to demonstrate improved upright posture with posterior shoulder girdle engaged    Time 6    Status New    Target Date 05/13/22      PT LONG TERM GOAL #2   Title Increase AROM cervical rotation and alteral flexion by 5-7 degrees to improve functional head turns    Time 6    Period Weeks    Status New    Target Date 05/13/22      PT LONG TERM GOAL #3   Title Patient to demonstrate and/or verbalize proper positions and postures for ADL's to avoid irritation of cervical and shoulder pain/problems    Time 6    Period Weeks    Status New    Target Date 05/13/22      PT LONG TERM GOAL #4   Title Independent in HEP    Time 6    Period Weeks    Status New    Target Date 05/13/22      PT LONG TERM GOAL #5   Title Improve functional limitation score to 64    Time 6    Period Weeks    Status New    Target Date 05/13/22                    Plan - 04/22/22 1113     Clinical Impression Statement Continued improvement in symptoms. No pain; no radicular symptoms; increasd ROM and strength Lt shoulder. Excellent progress.    Rehab Potential Good    PT Frequency 2x / week    PT Duration 6 weeks    PT Treatment/Interventions ADLs/Self Care Home Management;Aquatic Therapy;Cryotherapy;Electrical Stimulation;Iontophoresis '4mg'$ /ml Dexamethasone;Moist Heat;Ultrasound;Gait training;Stair training;Traction;Functional mobility training;Therapeutic activities;Therapeutic exercise;Neuromuscular re-education;Patient/family education;Manual techniques;Dry needling;Taping    PT Next Visit Plan review and progress with stretching and strengthening exercises; postural correction; advice on ADL positions and posture; continue manual therapy and DN; modalities as indicated - add strengthening as tolerated    PT Home Exercise Plan [email or web address]    Consulted and Agree with Plan of Care Patient             Patient will benefit from skilled therapeutic intervention in order to improve the following deficits and impairments:     Visit Diagnosis: Cervical dysfunction  Acute pain of left shoulder  Abnormal posture  Other symptoms and signs involving the musculoskeletal system  Chronic left shoulder pain     Problem List Patient Active Problem List   Diagnosis Date Noted   Charcot foot due to diabetes mellitus ([place]) 11/16/2021   Drug rash 11/16/2021   Status post reverse total replacement of left shoulder 09/30/2021   Hyperuricemia 07/14/2021   Septic joint of left shoulder region ([place]) 04/29/2021   Osteomyelitis of second toe of left foot (HCC)    Penicillin allergy    Chronic osteomyelitis ([place])    Diabetic foot infection ([place])    S/p total knee replacement,  bilateral    Septic arthritis of shoulder, left ([place]) 03/03/2021   Atrial fibrillation, chronic ([place]) 03/03/2021   Chronic anticoagulation 03/03/2021   DM (diabetes  mellitus), type 2 with neurological complications ([place]) 11/57/2620   Essential hypertension 03/03/2021   Hypokalemia 03/03/2021   Prolonged QT interval 03/03/2021   COPD (chronic obstructive pulmonary disease) ([place]) 03/03/2021   Chronic CHF (congestive heart failure) ([place]) 03/03/2021   Diabetic peripheral neuropathy ([place]) 01/03/2019   Obesity 06/01/2016   Wound dehiscence 05/04/2016   Knee osteoarthritis 04/06/2016    Tannia Contino Nilda Simmer, PT, MPH  04/22/2022, 11:47 AM  [facility] [place], [name], [address] Phone: [phone]   Fax:  [phone]  Name: Willie Pineda MRN: [id number] Date of Birth: 16-Mar-1946

## 2022-04-23 DIAGNOSIS — G4733 Obstructive sleep apnea (adult) (pediatric): Secondary | ICD-10-CM | POA: Diagnosis not present

## 2022-04-27 ENCOUNTER — Ambulatory Visit: Payer: Medicare Other | Admitting: Rehabilitative and Restorative Service Providers"

## 2022-04-27 ENCOUNTER — Encounter: Payer: Self-pay | Admitting: Rehabilitative and Restorative Service Providers"

## 2022-04-27 DIAGNOSIS — R29898 Other symptoms and signs involving the musculoskeletal system: Secondary | ICD-10-CM

## 2022-04-27 DIAGNOSIS — M6281 Muscle weakness (generalized): Secondary | ICD-10-CM

## 2022-04-27 DIAGNOSIS — M25512 Pain in left shoulder: Secondary | ICD-10-CM

## 2022-04-27 DIAGNOSIS — M539 Dorsopathy, unspecified: Secondary | ICD-10-CM

## 2022-04-27 DIAGNOSIS — R293 Abnormal posture: Secondary | ICD-10-CM | POA: Diagnosis not present

## 2022-04-27 DIAGNOSIS — G8929 Other chronic pain: Secondary | ICD-10-CM | POA: Diagnosis not present

## 2022-04-27 NOTE — Therapy (Signed)
[place], [name], [address] Phone: [phone]   Fax:  [phone]  Physical Therapy Treatment Rationale for Evaluation and Treatment Rehabilitation  Patient Details  Name: Willie Pineda MRN: [id number] Date of Birth: 07-Jul-1946 Referring Provider (PT): Dr [name]   Encounter Date: 04/27/2022   PT End of Session - 04/27/22 1028     Visit Number 8    Number of Visits 12    Date for PT Re-Evaluation 05/13/22    Progress Note Due on Visit 10    PT Start Time 1015    PT Stop Time 1105    PT Time Calculation (min) 50 min    Activity Tolerance Patient tolerated treatment well             Past Medical History:  Diagnosis Date   AICD (automatic cardioverter/defibrillator) present    Arthritis    "hands primarily" (05/05/2016)   Atrial fibrillation (HCC)    Back pain    age related   CHF (congestive heart failure) ([place])    no problemes since 3875   Complication of anesthesia    Bronchial spasms   COPD (chronic obstructive pulmonary disease) (HCC)    inhalers as needed   Coronary artery disease    Depression    takes Zoloft daily   Diabetic peripheral neuropathy ([place]) 01/03/2019   Drug rash 11/16/2021   Dysrhythmia    A Fib-takes Xarelto daily   Enlarged prostate    benign   GERD (gastroesophageal reflux disease)    takes Omeprazole daily   Gout    History of blood clots    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)   Hyperlipidemia    takes Atorvastatin daily   Hypertension    takes Metoprolol,HCTZ,and Losartan daily.Takes Clonidine if needed   Hyperuricemia 07/14/2021   Nocturia    Peripheral neuropathy    takes Gabapentin daily   Peripheral vascular disease (HCC)    legs blockage    Pneumonia ?2015   Presence of permanent cardiac pacemaker    Restless leg    Sleep apnea    uses Bipap   Stroke [facility]) 2012   denies residual on 05/05/2016    Past Surgical History:   Procedure Laterality Date   AMPUTATION Left 04/03/2021   Procedure: LEFT 2ND RAY AMPUTATION;  Surgeon: Newt Minion, MD;  Location: [place];  Service: Orthopedics;  Laterality: Left;   CARPAL TUNNEL RELEASE     CATARACT EXTRACTION W/ INTRAOCULAR LENS  IMPLANT, BILATERAL Bilateral    COLONOSCOPY WITH PROPOFOL N/A 03/09/2017   Procedure: COLONOSCOPY WITH PROPOFOL;  Surgeon: Wonda Horner, MD;  Location: [facility] ENDOSCOPY;  Service: Endoscopy;  Laterality: N/A;   COLONOSCOPY WITH PROPOFOL N/A 04/03/2020   Procedure: COLONOSCOPY WITH PROPOFOL;  Surgeon: Wonda Horner, MD;  [facility];  Service: Endoscopy;  Laterality: N/A;   I & D KNEE WITH POLY EXCHANGE Right 05/04/2016   IRRIGATION AND DEBRIDEMENT Right KNEE WITH UNICOMPARTMENTAL POLY EXCHANGE   I & D KNEE WITH POLY EXCHANGE Right 05/04/2016   Procedure: IRRIGATION AND DEBRIDEMENT Right KNEE WITH UNICOMPARTMENTAL POLY EXCHANGE;  Surgeon: Renette Butters, MD;  Location: [place];  Service: Orthopedics;  Laterality: Right;   JOINT REPLACEMENT     LAPAROSCOPIC CHOLECYSTECTOMY  1995   PARTIAL KNEE ARTHROPLASTY Right 04/06/2016   Procedure: UNICOMPARTMENTAL RIGHT KNEE;  Surgeon: Renette Butters, MD;  Location: [place];  Service: Orthopedics;  Laterality:  Right;   POLYPECTOMY  04/03/2020   Procedure: POLYPECTOMY;  Surgeon: Wonda Horner, MD;  [facility];  Service: Endoscopy;;   REVERSE SHOULDER ARTHROPLASTY Left 04/29/2021   Procedure: Revision antibiotic spacer placement. Incision and drainage.;  Surgeon: [name], MD;  [facility];  Service: Orthopedics;  Laterality: Left;   REVERSE SHOULDER ARTHROPLASTY Left 09/30/2021   Procedure: REVERSE SHOULDER ARTHROPLASTY;  Surgeon: [name], MD;  [facility];  Service: Orthopedics;  Laterality: Left;   STRABISMUS SURGERY Bilateral 06/10/2017   Procedure: REPAIR STRABISMUS BILATERAL;  Surgeon: Everitt Amber, MD;  Location: [place];  Service:  Ophthalmology;  Laterality: Bilateral;   TONSILLECTOMY AND ADENOIDECTOMY     TOTAL KNEE ARTHROPLASTY Left 2008   TOTAL SHOULDER ARTHROPLASTY Left 03/04/2021   Procedure: Total Shoulder Arthroplasty;  Surgeon: [name], MD;  [facility];  Service: Orthopedics;  Laterality: Left;   ULNAR NERVE TRANSPOSITION Bilateral 1989    There were no vitals filed for this visit.   Subjective Assessment - 04/27/22 1028     Subjective Shoulder feels good. No pain and getting stronger. Working on exercises at home but not as much as he shoulder.    Currently in Pain? No/denies    Pain Score 0-No pain                [facility] PT Assessment - 04/27/22 0001       Assessment   Medical Diagnosis Lt cervical radiculopathy    Referring Provider (PT) Dr [name]    Onset Date/Surgical Date 02/22/22    Hand Dominance Right    Next MD Visit 04/19/22    Prior Therapy here for knee; shoulder      Palpation   Palpation comment muscular tightness improving through the pecs, anterior deltoid; biceps Lt shoulder                           [facility] Adult PT Treatment/Exercise - 04/27/22 0001       Shoulder Exercises: Seated   Extension Strengthening;Both;10 reps;Theraband    Theraband Level (Shoulder Extension) Level 3 (Green)    Row Strengthening;Both;10 reps;Theraband    Theraband Level (Shoulder Row) Level 4 (Blue)    Row Limitations high row green TB x 10 reps    External Rotation Strengthening;Left;10 reps;Theraband    Theraband Level (Shoulder External Rotation) Level 2 (Red)    External Rotation Limitations high row to ER yellow TB x 10 x 2 sets    Internal Rotation Strengthening;Left;10 reps;Theraband    Theraband Level (Shoulder Internal Rotation) Level 2 (Red)    ABduction Limitations shoulder abd overhead shds 90 deg elbows 90 deg pushing hands overhead      Shoulder Exercises: Pulleys   Flexion 3 minutes    Other Pulley Exercises ER/IR hands even on pulley  handles      Moist Heat Therapy   Number Minutes Moist Heat 10 Minutes    Moist Heat Location Shoulder      Manual Therapy   Manual therapy comments skilled palpation to assess response to DN and manual work    Soft tissue mobilization deep tissue work through the Lt anterior and lateral shoulder - pecs, biceps, deltoid    Myofascial Release anterior shoulder - biceps/deltoid              Trigger Point Dry Needling - 04/27/22 0001     Consent Given? Yes  Education Handout Provided Previously provided    Dry Needling Comments pec at insertion to humerus avoiding pacemaker placement    Electrical Stimulation Performed with Dry Needling Yes    E-stim with Dry Needling Details mAmp x 5 min    Other Dry Needling needle threading for scar anterior Lt shoulder    Pectoralis Major Response Palpable increased muscle length;Twitch response elicited    Pectoralis Minor Response Palpable increased muscle length;Twitch response elicited    Deltoid Response Palpable increased muscle length;Twitch response elicited    Biceps Response Palpable increased muscle length;Twitch response elicited                        PT Long Term Goals - 04/01/22 1515       PT LONG TERM GOAL #1   Title Improve posture and alignment with patient to demonstrate improved upright posture with posterior shoulder girdle engaged    Time 6    Status New    Target Date 05/13/22      PT LONG TERM GOAL #2   Title Increase AROM cervical rotation and alteral flexion by 5-7 degrees to improve functional head turns    Time 6    Period Weeks    Status New    Target Date 05/13/22      PT LONG TERM GOAL #3   Title Patient to demonstrate and/or verbalize proper positions and postures for ADL's to avoid irritation of cervical and shoulder pain/problems    Time 6    Period Weeks    Status New    Target Date 05/13/22      PT LONG TERM GOAL #4   Title Independent in HEP    Time 6    Period Weeks     Status New    Target Date 05/13/22      PT LONG TERM GOAL #5   Title Improve functional limitation score to 64    Time 6    Period Weeks    Status New    Target Date 05/13/22                   Plan - 04/27/22 1046     Clinical Impression Statement Continued progress with no pain to speak of and no radicular symptoms. He continues to have positive benefit form DN and manual work as well as progress with strengthening.    Rehab Potential Good    PT Frequency 2x / week    PT Duration 6 weeks    PT Treatment/Interventions ADLs/Self Care Home Management;Aquatic Therapy;Cryotherapy;Electrical Stimulation;Iontophoresis '4mg'$ /ml Dexamethasone;Moist Heat;Ultrasound;Gait training;Stair training;Traction;Functional mobility training;Therapeutic activities;Therapeutic exercise;Neuromuscular re-education;Patient/family education;Manual techniques;Dry needling;Taping    PT Next Visit Plan review and progress with stretching and strengthening exercises; postural correction; advice on ADL positions and posture; continue manual therapy and DN; modalities as indicated - add strengthening as tolerated    PT Home Exercise Plan [email or web address]    Consulted and Agree with Plan of Care Patient             Patient will benefit from skilled therapeutic intervention in order to improve the following deficits and impairments:     Visit Diagnosis: Cervical dysfunction  Acute pain of left shoulder  Abnormal posture  Other symptoms and signs involving the musculoskeletal system  Chronic left shoulder pain  Muscle weakness (generalized)     Problem List Patient Active Problem List   Diagnosis Date Noted   Charcot foot due to diabetes mellitus (  [place]) 11/16/2021   Drug rash 11/16/2021   Status post reverse total replacement of left shoulder 09/30/2021   Hyperuricemia 07/14/2021   Septic joint of left shoulder region ([place]) 04/29/2021   Osteomyelitis of second toe of left foot ([place])    Penicillin  allergy    Chronic osteomyelitis ([place])    Diabetic foot infection ([place])    S/p total knee replacement, bilateral    Septic arthritis of shoulder, left ([place]) 03/03/2021   Atrial fibrillation, chronic ([place]) 03/03/2021   Chronic anticoagulation 03/03/2021   DM (diabetes mellitus), type 2 with neurological complications ([place]) 00/92/3300   Essential hypertension 03/03/2021   Hypokalemia 03/03/2021   Prolonged QT interval 03/03/2021   COPD (chronic obstructive pulmonary disease) ([place]) 03/03/2021   Chronic CHF (congestive heart failure) ([place]) 03/03/2021   Diabetic peripheral neuropathy ([place]) 01/03/2019   Obesity 06/01/2016   Wound dehiscence 05/04/2016   Knee osteoarthritis 04/06/2016    Yoali Conry Nilda Simmer, PT, MPH  04/27/2022, 11:01 AM  [facility] [place] [address] [place], [name], [address] Phone: [phone]   Fax:  [phone]  Name: Willie Pineda MRN: [id number] Date of Birth: [birth year]

## 2022-04-29 ENCOUNTER — Encounter: Payer: Self-pay | Admitting: Rehabilitative and Restorative Service Providers"

## 2022-04-29 ENCOUNTER — Ambulatory Visit: Payer: Medicare Other | Admitting: Rehabilitative and Restorative Service Providers"

## 2022-04-29 DIAGNOSIS — G8929 Other chronic pain: Secondary | ICD-10-CM

## 2022-04-29 DIAGNOSIS — M539 Dorsopathy, unspecified: Secondary | ICD-10-CM | POA: Diagnosis not present

## 2022-04-29 DIAGNOSIS — R29898 Other symptoms and signs involving the musculoskeletal system: Secondary | ICD-10-CM

## 2022-04-29 DIAGNOSIS — M6281 Muscle weakness (generalized): Secondary | ICD-10-CM | POA: Diagnosis not present

## 2022-04-29 DIAGNOSIS — R293 Abnormal posture: Secondary | ICD-10-CM

## 2022-04-29 DIAGNOSIS — M25512 Pain in left shoulder: Secondary | ICD-10-CM | POA: Diagnosis not present

## 2022-04-29 NOTE — Therapy (Signed)
[place], [name], [address] Phone: [phone]   Fax:  [phone]  Physical Therapy Treatment  Rationale for Evaluation and Treatment Rehabilitation   Patient Details  Name: Willie Pineda MRN: [id number] Date of Birth: 02-15-1946 Referring Provider (PT): Dr [name]   Encounter Date: 04/29/2022   PT End of Session - 04/29/22 1017     Visit Number 9    Number of Visits 12    Date for PT Re-Evaluation 05/13/22    Progress Note Due on Visit 10    PT Start Time 1014    PT Stop Time 1103    PT Time Calculation (min) 49 min    Activity Tolerance Patient tolerated treatment well             Past Medical History:  Diagnosis Date   AICD (automatic cardioverter/defibrillator) present    Arthritis    "hands primarily" (05/05/2016)   Atrial fibrillation (HCC)    Back pain    age related   CHF (congestive heart failure) ([place])    no problemes since 1324   Complication of anesthesia    Bronchial spasms   COPD (chronic obstructive pulmonary disease) (HCC)    inhalers as needed   Coronary artery disease    Depression    takes Zoloft daily   Diabetic peripheral neuropathy ([place]) 01/03/2019   Drug rash 11/16/2021   Dysrhythmia    A Fib-takes Xarelto daily   Enlarged prostate    benign   GERD (gastroesophageal reflux disease)    takes Omeprazole daily   Gout    History of blood clots    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)   Hyperlipidemia    takes Atorvastatin daily   Hypertension    takes Metoprolol,HCTZ,and Losartan daily.Takes Clonidine if needed   Hyperuricemia 07/14/2021   Nocturia    Peripheral neuropathy    takes Gabapentin daily   Peripheral vascular disease (HCC)    legs blockage    Pneumonia ?2015   Presence of permanent cardiac pacemaker    Restless leg    Sleep apnea    uses Bipap   Stroke [facility]) 2012   denies residual on 05/05/2016    Past Surgical  History:  Procedure Laterality Date   AMPUTATION Left 04/03/2021   Procedure: LEFT 2ND RAY AMPUTATION;  Surgeon: Newt Minion, MD;  Location: [place];  Service: Orthopedics;  Laterality: Left;   CARPAL TUNNEL RELEASE     CATARACT EXTRACTION W/ INTRAOCULAR LENS  IMPLANT, BILATERAL Bilateral    COLONOSCOPY WITH PROPOFOL N/A 03/09/2017   Procedure: COLONOSCOPY WITH PROPOFOL;  Surgeon: Wonda Horner, MD;  Location: [facility] ENDOSCOPY;  Service: Endoscopy;  Laterality: N/A;   COLONOSCOPY WITH PROPOFOL N/A 04/03/2020   Procedure: COLONOSCOPY WITH PROPOFOL;  Surgeon: Wonda Horner, MD;  [facility];  Service: Endoscopy;  Laterality: N/A;   I & D KNEE WITH POLY EXCHANGE Right 05/04/2016   IRRIGATION AND DEBRIDEMENT Right KNEE WITH UNICOMPARTMENTAL POLY EXCHANGE   I & D KNEE WITH POLY EXCHANGE Right 05/04/2016   Procedure: IRRIGATION AND DEBRIDEMENT Right KNEE WITH UNICOMPARTMENTAL POLY EXCHANGE;  Surgeon: Renette Butters, MD;  Location: [place];  Service: Orthopedics;  Laterality: Right;   JOINT REPLACEMENT     LAPAROSCOPIC CHOLECYSTECTOMY  1995   PARTIAL KNEE ARTHROPLASTY Right 04/06/2016   Procedure: UNICOMPARTMENTAL RIGHT KNEE;  Surgeon: Renette Butters, MD;  Location: [place];  Service: Orthopedics;  Laterality: Right;   POLYPECTOMY  04/03/2020   Procedure: POLYPECTOMY;  Surgeon: Wonda Horner, MD;  [facility];  Service: Endoscopy;;   REVERSE SHOULDER ARTHROPLASTY Left 04/29/2021   Procedure: Revision antibiotic spacer placement. Incision and drainage.;  Surgeon: [name], MD;  [facility];  Service: Orthopedics;  Laterality: Left;   REVERSE SHOULDER ARTHROPLASTY Left 09/30/2021   Procedure: REVERSE SHOULDER ARTHROPLASTY;  Surgeon: [name], MD;  [facility];  Service: Orthopedics;  Laterality: Left;   STRABISMUS SURGERY Bilateral 06/10/2017   Procedure: REPAIR STRABISMUS BILATERAL;  Surgeon: Everitt Amber, MD;  Location: [place];  Service:  Ophthalmology;  Laterality: Bilateral;   TONSILLECTOMY AND ADENOIDECTOMY     TOTAL KNEE ARTHROPLASTY Left 2008   TOTAL SHOULDER ARTHROPLASTY Left 03/04/2021   Procedure: Total Shoulder Arthroplasty;  Surgeon: [name], MD;  [facility];  Service: Orthopedics;  Laterality: Left;   ULNAR NERVE TRANSPOSITION Bilateral 1989    There were no vitals filed for this visit.   Subjective Assessment - 04/29/22 1017     Subjective Shoulder feels good. No pain and getting stronger. Did work on exercises some yesterday.    Currently in Pain? No/denies    Pain Score 0-No pain    Pain Location Shoulder                               [facility] Adult PT Treatment/Exercise - 04/29/22 0001       Shoulder Exercises: Seated   Extension Strengthening;Both;10 reps;Theraband    Theraband Level (Shoulder Extension) Level 4 (Blue)    Row Strengthening;Both;10 reps;Theraband    Theraband Level (Shoulder Row) Level 4 (Blue)    Row Limitations high row blue TB x 10 reps    External Rotation Strengthening;Left;10 reps;Theraband    Theraband Level (Shoulder External Rotation) Level 2 (Red)    External Rotation Limitations high row to ER yellow TB x 10 x 2 sets    Internal Rotation Strengthening;Left;10 reps;Theraband    Theraband Level (Shoulder Internal Rotation) Level 2 (Red)    ABduction Limitations shoulder abd overhead shds 90 deg elbows 90 deg pushing hands overhead      Shoulder Exercises: Pulleys   Flexion 3 minutes    Other Pulley Exercises ER/IR hands even on pulley handles      Shoulder Exercises: Stretch   Other Shoulder Stretches biceps stretch sitting hand ehind body for gentle stretch 20-30 sec x 3 reps    Other Shoulder Stretches doorway stretch 30 sec x 2 reps 3 positions; triceps stretch sitting 30 sec x 3 reps      Moist Heat Therapy   Number Minutes Moist Heat 10 Minutes    Moist Heat Location Shoulder      Manual Therapy   Manual therapy comments skilled  palpation to assess response to DN and manual work    Soft tissue mobilization deep tissue work through the Lt anterior and lateral shoulder - pecs, biceps, deltoid    Myofascial Release anterior shoulder - biceps/deltoid              Trigger Point Dry Needling - 04/29/22 0001     Consent Given? Yes    Education Handout Provided Previously provided    Dry Needling Comments pec at insertion to humerus avoiding pacemaker placement    Electrical Stimulation Performed with Dry Needling Yes    E-stim with Dry Needling Details mAmp  x 5 min    Other Dry Needling needle threading for scar anterior Lt shoulder    Pectoralis Major Response Palpable increased muscle length;Twitch response elicited    Pectoralis Minor Response Palpable increased muscle length;Twitch response elicited    Deltoid Response Palpable increased muscle length;Twitch response elicited    Biceps Response Palpable increased muscle length;Twitch response elicited                        PT Long Term Goals - 04/01/22 1515       PT LONG TERM GOAL #1   Title Improve posture and alignment with patient to demonstrate improved upright posture with posterior shoulder girdle engaged    Time 6    Status New    Target Date 05/13/22      PT LONG TERM GOAL #2   Title Increase AROM cervical rotation and alteral flexion by 5-7 degrees to improve functional head turns    Time 6    Period Weeks    Status New    Target Date 05/13/22      PT LONG TERM GOAL #3   Title Patient to demonstrate and/or verbalize proper positions and postures for ADL's to avoid irritation of cervical and shoulder pain/problems    Time 6    Period Weeks    Status New    Target Date 05/13/22      PT LONG TERM GOAL #4   Title Independent in HEP    Time 6    Period Weeks    Status New    Target Date 05/13/22      PT LONG TERM GOAL #5   Title Improve functional limitation score to 64    Time 6    Period Weeks    Status New     Target Date 05/13/22                   Plan - 04/29/22 1019     Clinical Impression Statement Progressing well with Lt shoulder rehab. No radicular symptoms Lt UE. Continues to work on strengthening and stabilization. Positive response to DN and manual work.    Rehab Potential Good    PT Frequency 2x / week    PT Duration 6 weeks    PT Treatment/Interventions ADLs/Self Care Home Management;Aquatic Therapy;Cryotherapy;Electrical Stimulation;Iontophoresis '4mg'$ /ml Dexamethasone;Moist Heat;Ultrasound;Gait training;Stair training;Traction;Functional mobility training;Therapeutic activities;Therapeutic exercise;Neuromuscular re-education;Patient/family education;Manual techniques;Dry needling;Taping    PT Next Visit Plan review and progress with stretching and strengthening exercises; postural correction; advice on ADL positions and posture; continue manual therapy and DN; modalities as indicated - add strengthening as tolerated    PT Home Exercise Plan [email or web address]    Consulted and Agree with Plan of Care Patient             Patient will benefit from skilled therapeutic intervention in order to improve the following deficits and impairments:     Visit Diagnosis: Cervical dysfunction  Acute pain of left shoulder  Abnormal posture  Other symptoms and signs involving the musculoskeletal system  Chronic left shoulder pain     Problem List Patient Active Problem List   Diagnosis Date Noted   Charcot foot due to diabetes mellitus ([place]) 11/16/2021   Drug rash 11/16/2021   Status post reverse total replacement of left shoulder 09/30/2021   Hyperuricemia 07/14/2021   Septic joint of left shoulder region ([place]) 04/29/2021   Osteomyelitis of second toe of left foot (HCC)    Penicillin  allergy    Chronic osteomyelitis ([place])    Diabetic foot infection ([place])    S/p total knee replacement, bilateral    Septic arthritis of shoulder, left ([place]) 03/03/2021   Atrial fibrillation,  chronic ([place]) 03/03/2021   Chronic anticoagulation 03/03/2021   DM (diabetes mellitus), type 2 with neurological complications ([place]) 70/14/1030   Essential hypertension 03/03/2021   Hypokalemia 03/03/2021   Prolonged QT interval 03/03/2021   COPD (chronic obstructive pulmonary disease) ([place]) 03/03/2021   Chronic CHF (congestive heart failure) ([place]) 03/03/2021   Diabetic peripheral neuropathy ([place]) 01/03/2019   Obesity 06/01/2016   Wound dehiscence 05/04/2016   Knee osteoarthritis 04/06/2016    Natalija Mavis Nilda Simmer, PT, MPH  04/29/2022, 10:58 AM  [facility] [place], [name], [address] Phone: [phone]   Fax:  [phone]  Name: KEDRICK MCNAMEE MRN: [id number] Date of Birth: Dec 12, 1945

## 2022-05-04 DIAGNOSIS — M14672 Charcot's joint, left ankle and foot: Secondary | ICD-10-CM | POA: Diagnosis not present

## 2022-05-06 ENCOUNTER — Ambulatory Visit: Payer: Medicare Other | Attending: Orthopaedic Surgery | Admitting: Rehabilitative and Restorative Service Providers"

## 2022-05-06 ENCOUNTER — Encounter: Payer: Self-pay | Admitting: Rehabilitative and Restorative Service Providers"

## 2022-05-06 DIAGNOSIS — R29898 Other symptoms and signs involving the musculoskeletal system: Secondary | ICD-10-CM

## 2022-05-06 DIAGNOSIS — R293 Abnormal posture: Secondary | ICD-10-CM | POA: Diagnosis not present

## 2022-05-06 DIAGNOSIS — R2689 Other abnormalities of gait and mobility: Secondary | ICD-10-CM | POA: Insufficient documentation

## 2022-05-06 DIAGNOSIS — M539 Dorsopathy, unspecified: Secondary | ICD-10-CM | POA: Diagnosis not present

## 2022-05-06 DIAGNOSIS — R2681 Unsteadiness on feet: Secondary | ICD-10-CM | POA: Diagnosis not present

## 2022-05-06 DIAGNOSIS — M6281 Muscle weakness (generalized): Secondary | ICD-10-CM

## 2022-05-06 DIAGNOSIS — M25512 Pain in left shoulder: Secondary | ICD-10-CM | POA: Diagnosis not present

## 2022-05-06 DIAGNOSIS — G8929 Other chronic pain: Secondary | ICD-10-CM

## 2022-05-06 NOTE — Therapy (Addendum)
[place] [address] [place], [name], [address] Phone: [phone]   Fax:  [phone]  Physical Therapy Treatment and 10th visit Medicare Note  Progress Note Reporting Period 04/01/22 to 05/06/22  See note below for Objective Data and Assessment of Progress/Goals.  PHYSICAL THERAPY DISCHARGE SUMMARY  Visits from Start of Care: 10  Current functional level related to goals / functional outcomes: See progress note for discharge status    Remaining deficits: Needs to continue with HEP and postural correction    Education / Equipment: HEP    Patient agrees to discharge. Patient goals were partially met. Patient is being discharged due to being pleased with the current functional level.  Leah Thornberry P. Helene Kelp PT, MPH 06/03/22 8:43 AM    Rationale for Evaluation and Treatment Rehabilitation  Patient Details  Name: Willie Pineda MRN: [id number] Date of Birth: 1946/03/19 Referring Provider (PT): Dr [name]   Encounter Date: 05/06/2022   PT End of Session - 05/06/22 1409     Visit Number 10    Number of Visits 12    Date for PT Re-Evaluation 05/13/22    Progress Note Due on Visit 10    PT Start Time 1400    PT Stop Time 1448    PT Time Calculation (min) 48 min    Activity Tolerance Patient tolerated treatment well             Past Medical History:  Diagnosis Date   AICD (automatic cardioverter/defibrillator) present    Arthritis    "hands primarily" (05/05/2016)   Atrial fibrillation (HCC)    Back pain    age related   CHF (congestive heart failure) ([place])    no problemes since 7915   Complication of anesthesia    Bronchial spasms   COPD (chronic obstructive pulmonary disease) ([place])    inhalers as needed   Coronary artery disease    Depression    takes Zoloft daily   Diabetic peripheral neuropathy ([place]) 01/03/2019   Drug rash 11/16/2021   Dysrhythmia    A Fib-takes Xarelto daily   Enlarged  prostate    benign   GERD (gastroesophageal reflux disease)    takes Omeprazole daily   Gout    History of blood clots    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)   Hyperlipidemia    takes Atorvastatin daily   Hypertension    takes Metoprolol,HCTZ,and Losartan daily.Takes Clonidine if needed   Hyperuricemia 07/14/2021   Nocturia    Peripheral neuropathy    takes Gabapentin daily   Peripheral vascular disease (HCC)    legs blockage    Pneumonia ?2015   Presence of permanent cardiac pacemaker    Restless leg    Sleep apnea    uses Bipap   Stroke [facility]) 2012   denies residual on 05/05/2016    Past Surgical History:  Procedure Laterality Date   AMPUTATION Left 04/03/2021   Procedure: LEFT 2ND RAY AMPUTATION;  Surgeon: Newt Minion, MD;  Location: [place];  Service: Orthopedics;  Laterality: Left;   CARPAL TUNNEL RELEASE     CATARACT EXTRACTION W/ INTRAOCULAR LENS  IMPLANT, BILATERAL Bilateral    COLONOSCOPY WITH PROPOFOL N/A 03/09/2017   Procedure: COLONOSCOPY WITH PROPOFOL;  Surgeon: Wonda Horner, MD;  Location: [facility] ENDOSCOPY;  Service: Endoscopy;  Laterality: N/A;   COLONOSCOPY WITH PROPOFOL N/A 04/03/2020   Procedure: COLONOSCOPY WITH PROPOFOL;  Surgeon: Wonda Horner, MD;  [facility];  Service: Endoscopy;  Laterality: N/A;   I & D KNEE WITH POLY EXCHANGE Right 05/04/2016   IRRIGATION AND DEBRIDEMENT Right KNEE WITH UNICOMPARTMENTAL POLY EXCHANGE   I & D KNEE WITH POLY EXCHANGE Right 05/04/2016   Procedure: IRRIGATION AND DEBRIDEMENT Right KNEE WITH UNICOMPARTMENTAL POLY EXCHANGE;  Surgeon: Renette Butters, MD;  Location: [place];  Service: Orthopedics;  Laterality: Right;   JOINT REPLACEMENT     LAPAROSCOPIC CHOLECYSTECTOMY  1995   PARTIAL KNEE ARTHROPLASTY Right 04/06/2016   Procedure: UNICOMPARTMENTAL RIGHT KNEE;  Surgeon: Renette Butters, MD;  Location: [facility];  Service: Orthopedics;  Laterality: Right;   POLYPECTOMY  04/03/2020   Procedure: POLYPECTOMY;   Surgeon: Wonda Horner, MD;  [facility];  Service: Endoscopy;;   REVERSE SHOULDER ARTHROPLASTY Left 04/29/2021   Procedure: Revision antibiotic spacer placement. Incision and drainage.;  Surgeon: [name], MD;  [facility];  Service: Orthopedics;  Laterality: Left;   REVERSE SHOULDER ARTHROPLASTY Left 09/30/2021   Procedure: REVERSE SHOULDER ARTHROPLASTY;  Surgeon: [name], MD;  [facility];  Service: Orthopedics;  Laterality: Left;   STRABISMUS SURGERY Bilateral 06/10/2017   Procedure: REPAIR STRABISMUS BILATERAL;  Surgeon: Everitt Amber, MD;  Location: [place];  Service: Ophthalmology;  Laterality: Bilateral;   TONSILLECTOMY AND ADENOIDECTOMY     TOTAL KNEE ARTHROPLASTY Left 2008   TOTAL SHOULDER ARTHROPLASTY Left 03/04/2021   Procedure: Total Shoulder Arthroplasty;  Surgeon: [name], MD;  [facility];  Service: Orthopedics;  Laterality: Left;   ULNAR NERVE TRANSPOSITION Bilateral 1989    There were no vitals filed for this visit.   Subjective Assessment - 05/06/22 1449     Subjective Shoulder feels good. No pain and getting stronger. Will be out of town visiting his daughter for the next few weeks. .    Currently in Pain? No/denies    Pain Score 0-No pain                [facility] PT Assessment - 05/06/22 0001       Assessment   Medical Diagnosis Lt cervical radiculopathy    Referring Provider (PT) Dr [name]    Onset Date/Surgical Date 02/22/22    Hand Dominance Right    Next MD Visit 04/19/22    Prior Therapy here for knee; shoulder      Observation/Other Assessments   Focus on Therapeutic Outcomes (FOTO)  69      Sensation   Additional Comments resloving tingling in the Lt fingertips palmar surface - present for at least the past 3 years since prior to CT release and ulnar nerve release      AROM   Right Shoulder Extension 57 Degrees    Right Shoulder Flexion 141 Degrees    Right Shoulder ABduction 153  Degrees    Right Shoulder Internal Rotation --   thumb T10/11   Right Shoulder External Rotation 68 Degrees    Left Shoulder Extension 47 Degrees    Left Shoulder Flexion 126 Degrees    Left Shoulder ABduction 124 Degrees    Left Shoulder Internal Rotation --   hand to lateral side   Left Shoulder External Rotation 33 Degrees   UE at side elbow bent to 90 deg   Cervical Flexion 47    Cervical Extension 42    Cervical - Right Side Bend 24    Cervical - Left Side Bend 26    Cervical - Right Rotation  54    Cervical - Left Rotation 55      Palpation   Palpation comment muscular tightness improving through the pecs, anterior deltoid; biceps Lt shoulder                           [facility] Adult PT Treatment/Exercise - 05/06/22 0001       Shoulder Exercises: Seated   Extension Strengthening;Both;10 reps;Theraband    Theraband Level (Shoulder Extension) Level 4 (Blue)    Row Strengthening;Both;10 reps;Theraband    Theraband Level (Shoulder Row) Level 4 (Blue)    Row Limitations high row blue TB x 10 reps    External Rotation Strengthening;Left;10 reps;Theraband    Theraband Level (Shoulder External Rotation) Level 2 (Red)    External Rotation Limitations high row to ER yellow TB x 10 x 2 sets    Internal Rotation Strengthening;Left;10 reps;Theraband    Theraband Level (Shoulder Internal Rotation) Level 2 (Red)    ABduction Limitations shoulder abd overhead shds 90 deg elbows 90 deg pushing hands overhead      Shoulder Exercises: Pulleys   Flexion 3 minutes    Other Pulley Exercises ER/IR hands even on pulley handles      Shoulder Exercises: Stretch   Other Shoulder Stretches biceps stretch sitting hand ehind body for gentle stretch 20-30 sec x 3 reps    Other Shoulder Stretches doorway stretch 30 sec x 2 reps 3 positions; triceps stretch sitting 30 sec x 3 reps      Moist Heat Therapy   Number Minutes Moist Heat 10 Minutes    Moist Heat Location Shoulder       Manual Therapy   Manual therapy comments skilled palpation to assess response to DN and manual work    Soft tissue mobilization deep tissue work through the Lt anterior and lateral shoulder - pecs, biceps, deltoid    Myofascial Release anterior shoulder - biceps/deltoid              Trigger Point Dry Needling - 05/06/22 0001     Consent Given? Yes    Education Handout Provided Previously provided    Dry Needling Comments pec at insertion to humerus avoiding pacemaker placement    Electrical Stimulation Performed with Dry Needling Yes    E-stim with Dry Needling Details mAmp x 5 min    Other Dry Needling needle threading for scar anterior Lt shoulder    Pectoralis Major Response Palpable increased muscle length;Twitch response elicited    Pectoralis Minor Response Palpable increased muscle length;Twitch response elicited    Deltoid Response Palpable increased muscle length;Twitch response elicited    Biceps Response Palpable increased muscle length;Twitch response elicited                        PT Long Term Goals - 05/06/22 1450       PT LONG TERM GOAL #1   Title Improve posture and alignment with patient to demonstrate improved upright posture with posterior shoulder girdle engaged    Time 6    Period Weeks    Status On-going    Target Date 05/13/22      PT LONG TERM GOAL #2   Title Increase AROM cervical rotation and alteral flexion by 5-7 degrees to improve functional head turns    Time 6    Period Weeks    Status Achieved    Target Date 05/13/22  PT LONG TERM GOAL #3   Title Patient to demonstrate and/or verbalize proper positions and postures for ADL's to avoid irritation of cervical and shoulder pain/problems    Time 6    Period Weeks    Status On-going      PT LONG TERM GOAL #4   Title Independent in HEP    Time 6    Period Weeks    Status Partially Met    Target Date 05/13/22      PT LONG TERM GOAL #5   Title Improve functional  limitation score to 64    Time 6    Period Weeks    Status Achieved    Target Date 05/13/22                   Plan - 05/06/22 1409     Clinical Impression Statement Excellent progress with resolution of Lt UE radicular symptoms He has increased ROM, strength and function of Lt shoulder. Positive response to DN and manual work through anterior shoulder    Rehab Potential Good    PT Frequency 2x / week    PT Duration 6 weeks    PT Treatment/Interventions ADLs/Self Care Home Management;Aquatic Therapy;Cryotherapy;Electrical Stimulation;Iontophoresis 4mg /ml Dexamethasone;Moist Heat;Ultrasound;Gait training;Stair training;Traction;Functional mobility training;Therapeutic activities;Therapeutic exercise;Neuromuscular re-education;Patient/family education;Manual techniques;Dry needling;Taping    PT Next Visit Plan hold therapy- patient will be out of town on vacation    PT [place] and Agree with Plan of Care Patient             Patient will benefit from skilled therapeutic intervention in order to improve the following deficits and impairments:     Visit Diagnosis: Cervical dysfunction  Acute pain of left shoulder  Abnormal posture  Other symptoms and signs involving the musculoskeletal system  Chronic left shoulder pain  Muscle weakness (generalized)     Problem List Patient Active Problem List   Diagnosis Date Noted   Charcot foot due to diabetes mellitus ([place]) 11/16/2021   Drug rash 11/16/2021   Status post reverse total replacement of left shoulder 09/30/2021   Hyperuricemia 07/14/2021   Septic joint of left shoulder region ([place]) 04/29/2021   Osteomyelitis of second toe of left foot (HCC)    Penicillin allergy    Chronic osteomyelitis ([place])    Diabetic foot infection ([place])    S/p total knee replacement, bilateral    Septic arthritis of shoulder, left ([place]) 03/03/2021   Atrial fibrillation, chronic ([place]) 03/03/2021    Chronic anticoagulation 03/03/2021   DM (diabetes mellitus), type 2 with neurological complications ([place]) 20/35/5974   Essential hypertension 03/03/2021   Hypokalemia 03/03/2021   Prolonged QT interval 03/03/2021   COPD (chronic obstructive pulmonary disease) ([place]) 03/03/2021   Chronic CHF (congestive heart failure) ([place]) 03/03/2021   Diabetic peripheral neuropathy ([place]) 01/03/2019   Obesity 06/01/2016   Wound dehiscence 05/04/2016   Knee osteoarthritis 04/06/2016    Johnrobert Foti Nilda Simmer, PT, MPH  05/06/2022, 4:18 PM  [facility] [place] [address] [place], [name], [address] Phone: [phone]   Fax:  [phone]  Name: Willie Pineda MRN: [id number] Date of Birth: 01/09/1946

## 2022-06-01 DIAGNOSIS — I4821 Permanent atrial fibrillation: Secondary | ICD-10-CM | POA: Diagnosis not present

## 2022-06-01 DIAGNOSIS — I10 Essential (primary) hypertension: Secondary | ICD-10-CM | POA: Diagnosis not present

## 2022-06-03 ENCOUNTER — Ambulatory Visit: Payer: Medicare Other | Admitting: Physical Therapy

## 2022-06-03 DIAGNOSIS — K219 Gastro-esophageal reflux disease without esophagitis: Secondary | ICD-10-CM | POA: Diagnosis not present

## 2022-06-03 DIAGNOSIS — G8929 Other chronic pain: Secondary | ICD-10-CM | POA: Diagnosis not present

## 2022-06-03 DIAGNOSIS — R2681 Unsteadiness on feet: Secondary | ICD-10-CM | POA: Diagnosis not present

## 2022-06-03 DIAGNOSIS — M6281 Muscle weakness (generalized): Secondary | ICD-10-CM

## 2022-06-03 DIAGNOSIS — E1165 Type 2 diabetes mellitus with hyperglycemia: Secondary | ICD-10-CM | POA: Diagnosis not present

## 2022-06-03 DIAGNOSIS — R293 Abnormal posture: Secondary | ICD-10-CM | POA: Diagnosis not present

## 2022-06-03 DIAGNOSIS — R29898 Other symptoms and signs involving the musculoskeletal system: Secondary | ICD-10-CM | POA: Diagnosis not present

## 2022-06-03 DIAGNOSIS — I4891 Unspecified atrial fibrillation: Secondary | ICD-10-CM | POA: Diagnosis not present

## 2022-06-03 DIAGNOSIS — R2689 Other abnormalities of gait and mobility: Secondary | ICD-10-CM | POA: Diagnosis not present

## 2022-06-03 DIAGNOSIS — M25512 Pain in left shoulder: Secondary | ICD-10-CM | POA: Diagnosis not present

## 2022-06-03 DIAGNOSIS — I10 Essential (primary) hypertension: Secondary | ICD-10-CM | POA: Diagnosis not present

## 2022-06-03 DIAGNOSIS — I509 Heart failure, unspecified: Secondary | ICD-10-CM | POA: Diagnosis not present

## 2022-06-03 DIAGNOSIS — E785 Hyperlipidemia, unspecified: Secondary | ICD-10-CM | POA: Diagnosis not present

## 2022-06-03 DIAGNOSIS — M539 Dorsopathy, unspecified: Secondary | ICD-10-CM | POA: Diagnosis not present

## 2022-06-03 NOTE — Therapy (Signed)
[facility] [place], [name], [address] Phone: [phone]   Fax:  [phone]  Physical Therapy Evaluation  Patient Details  Name: Willie Pineda MRN: [id number] Date of Birth: [birth year] Referring Provider (PT): Armond Hang, MD  Rationale for Evaluation and Treatment Rehabilitation  Encounter Date: 06/03/2022   PT End of Session - 06/03/22 1603     Visit Number 1    Number of Visits 12    Date for PT Re-Evaluation 07/15/22    Authorization Type [employer] Medicare    Progress Note Due on Visit 10    PT Start Time 1405    PT Stop Time 1445    PT Time Calculation (min) 40 min    Activity Tolerance Patient tolerated treatment well    Behavior During Therapy [facility] for tasks assessed/performed             Past Medical History:  Diagnosis Date   AICD (automatic cardioverter/defibrillator) present    Arthritis    "hands primarily" (05/05/2016)   Atrial fibrillation ([place])    Back pain    age related   CHF (congestive heart failure) ([place])    no problemes since 7628   Complication of anesthesia    Bronchial spasms   COPD (chronic obstructive pulmonary disease) ([place])    inhalers as needed   Coronary artery disease    Depression    takes Zoloft daily   Diabetic peripheral neuropathy ([place]) 01/03/2019   Drug rash 11/16/2021   Dysrhythmia    A Fib-takes Xarelto daily   Enlarged prostate    benign   GERD (gastroesophageal reflux disease)    takes Omeprazole daily   Gout    History of blood clots    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)   Hyperlipidemia    takes Atorvastatin daily   Hypertension    takes Metoprolol,HCTZ,and Losartan daily.Takes Clonidine if needed   Hyperuricemia 07/14/2021   Nocturia    Peripheral neuropathy    takes Gabapentin daily   Peripheral vascular disease (HCC)    legs blockage    Pneumonia ?2015   Presence of permanent cardiac pacemaker    Restless leg     Sleep apnea    uses Bipap   Stroke [facility]) 2012   denies residual on 05/05/2016    Past Surgical History:  Procedure Laterality Date   AMPUTATION Left 04/03/2021   Procedure: LEFT 2ND RAY AMPUTATION;  Surgeon: Newt Minion, MD;  Location: [facility];  Service: Orthopedics;  Laterality: Left;   CARPAL TUNNEL RELEASE     CATARACT EXTRACTION W/ INTRAOCULAR LENS  IMPLANT, BILATERAL Bilateral    COLONOSCOPY WITH PROPOFOL N/A 03/09/2017   Procedure: COLONOSCOPY WITH PROPOFOL;  Surgeon: Wonda Horner, MD;  Location[facility] ENDOSCOPY;  Service: Endoscopy;  Laterality: N/A;   COLONOSCOPY WITH PROPOFOL N/A 04/03/2020   Procedure: COLONOSCOPY WITH PROPOFOL;  Surgeon: Wonda Horner, MD;  Location: [facility] ENDOSCOPY;  Service: Endoscopy;  Laterality: N/A;   I & D KNEE WITH POLY EXCHANGE Right 05/04/2016   IRRIGATION AND DEBRIDEMENT Right KNEE WITH UNICOMPARTMENTAL POLY EXCHANGE   I & D KNEE WITH POLY EXCHANGE Right 05/04/2016   Procedure: IRRIGATION AND DEBRIDEMENT Right KNEE WITH UNICOMPARTMENTAL POLY EXCHANGE;  Surgeon: Renette Butters, MD;  Location: [facility];  Service: Orthopedics;  Laterality: Right;   JOINT REPLACEMENT     LAPAROSCOPIC CHOLECYSTECTOMY  1995   PARTIAL KNEE ARTHROPLASTY Right 04/06/2016   Procedure: UNICOMPARTMENTAL  RIGHT KNEE;  Surgeon: Renette Butters, MD;  Location: [place];  Service: Orthopedics;  Laterality: Right;   POLYPECTOMY  04/03/2020   Procedure: POLYPECTOMY;  Surgeon: Wonda Horner, MD;  Location: [facility] ENDOSCOPY;  Service: Endoscopy;;   REVERSE SHOULDER ARTHROPLASTY Left 04/29/2021   Procedure: Revision antibiotic spacer placement. Incision and drainage.;  Surgeon: [name], MD;  Location: [facility];  Service: Orthopedics;  Laterality: Left;   REVERSE SHOULDER ARTHROPLASTY Left 09/30/2021   Procedure: REVERSE SHOULDER ARTHROPLASTY;  Surgeon: [name], MD;  Location: [facility];  Service: Orthopedics;  Laterality: Left;   STRABISMUS SURGERY Bilateral 06/10/2017   Procedure: REPAIR  STRABISMUS BILATERAL;  Surgeon: Everitt Amber, MD;  Location: [facility];  Service: Ophthalmology;  Laterality: Bilateral;   TONSILLECTOMY AND ADENOIDECTOMY     TOTAL KNEE ARTHROPLASTY Left 2008   TOTAL SHOULDER ARTHROPLASTY Left 03/04/2021   Procedure: Total Shoulder Arthroplasty;  Surgeon: [name], MD;  Location: [facility];  Service: Orthopedics;  Laterality: Left;   ULNAR NERVE TRANSPOSITION Bilateral 1989    There were no vitals filed for this visit.    Subjective Assessment - 06/03/22 1409     Subjective Pt reports fall yesterday hitting his head -- had difficulty moving and talking. Pt reports he is stiff and sore today. Pt reports when visiting daughter he stepped on uneven ground and hurt L shoulder -- had septic shoulder and needed repair. Tracking for pathogen of septic shoulder, they tracked it down to his L toe. L toe was amputated but then he got charcot foot. Saw Ramanathan for 2nd opinion. Recommended to cast for multiple weeks. Now he is in [employer] -- has to be in the boot all the time. Pt notes biggest problem is balance. Not sure how much is related to foot, sedentary lifestyle, and ears.    Pertinent History Lt reverse TSA; bilat TKA; pacemaker/defibulator; Lt second toe amputation; HTN; CAD; stroke; AODM; cataract sx; sleep apnea; infection Lt shoulder/knee post joint replacements; tingling Lt hand fingertips, palmar surface following carpal tunnel release and ulnar nerve release ~ 3 years ago tingling continues to improve    Patient Stated Goals get rid of the Lt shoulder pain and increase mobliity and use of Lt UE    Currently in Pain? No/denies                [facility] PT Assessment - 06/03/22 0001       Assessment   Medical Diagnosis R26.9 (ICD-10-CM) - Gait difficulty    Referring Provider (PT) Armond Hang, MD    Onset Date/Surgical Date --   Over a year ago   Hand Dominance Right    Prior Therapy knee and shoulder      Precautions    Precautions Fall      Restrictions   Weight Bearing Restrictions No      Balance Screen   Has the patient fallen in the past 6 months Yes    How many times? 1   Yesterday morning -- stepped out of shower   Has the patient had a decrease in activity level because of a fear of falling?  Yes    Is the patient reluctant to leave their home because of a fear of falling?  Yes      [place] residence    Living Arrangements Spouse/significant other    Available Help at Discharge Family    Type of [place]  Prior Function   Vocation Retired    Chief Technology Officer retired 2016    Leisure exercises for [employer]; walking; household repair/chores      Observation/Other Assessments   Focus on Therapeutic Outcomes (FOTO)  n/a      Sensation   Additional Comments reports decreased gross sensation of bilat LEs      Posture/Postural Control   Posture Comments L>R higher iliac crest due to boot      ROM / Strength   AROM / PROM / Strength Strength      Strength   Strength Assessment Site Hip    Right/Left Hip Right;Left    Right Hip Flexion 5/5    Right Hip Extension 4/5    Right Hip ABduction 4/5    Left Hip Flexion 4+/5    Left Hip Extension 4-/5    Left Hip ABduction 4-/5      Transfers   Five time sit to stand comments  14 sec      Ambulation/Gait   Ambulation Distance (Feet) 90 Feet    Assistive device None   crow boot   Gait Pattern Step-through pattern;Antalgic   wide BOS   Ambulation Surface Level;Indoor      Standardized Balance Assessment   Standardized Balance Assessment Berg Balance Test      Berg Balance Test   Sit to Stand Able to stand without using hands and stabilize independently    Standing Unsupported Able to stand safely 2 minutes    Sitting with Back Unsupported but Feet Supported on Floor or Stool Able to sit safely and securely 2 minutes    Stand to Sit Sits safely with minimal use of hands    Transfers  Able to transfer safely, minor use of hands    Standing Unsupported with Eyes Closed Able to stand 10 seconds with supervision    Standing Unsupported with Feet Together Able to place feet together independently and stand 1 minute safely    From Standing, Reach Forward with Outstretched Arm Can reach forward >5 cm safely (2")    From Standing Position, Pick up Object from Floor Able to pick up shoe safely and easily    From Standing Position, Turn to Look Behind Over each Shoulder Looks behind from both sides and weight shifts well    Turn 360 Degrees Able to turn 360 degrees safely one side only in 4 seconds or less    Standing Unsupported, Alternately Place Feet on Step/Stool Able to stand independently and complete 8 steps >20 seconds    Standing Unsupported, One Foot in Front Able to plae foot ahead of the other independently and hold 30 seconds    Standing on One Leg Tries to lift leg/unable to hold 3 seconds but remains standing independently    Total Score 47    Berg comment: 47/56      High Level Balance   High Level Balance Comments Standing on foam: EO 30 sec; EC 30 sec but unsteady with 1 minor LOB; On firm surface: EO 30 sec, EC 30 sec                        Objective measurements completed on examination: See above findings.                PT Education - 06/03/22 1603     Education Details Discussed exam findings and POC    Person(s) Educated Patient    Methods Explanation;Demonstration;Tactile cues;Verbal  cues    Comprehension Verbalized understanding;Returned demonstration;Verbal cues required;Tactile cues required                 PT Long Term Goals - 06/03/22 1610       PT LONG TERM GOAL #1   Title Pt will be independent with his strength/balance HEP    Time 6    Period Weeks    Status New    Target Date 07/15/22      PT LONG TERM GOAL #2   Title Pt will have improved 5x STS to </=13 sec to demo low fall risk    Time 6     Period Weeks    Status New    Target Date 07/15/22      PT LONG TERM GOAL #3   Title Pt will have improved Berg Balance Score to at least 52/56 to demo low fall risk    Baseline 47/56    Time 6    Period Weeks    Status New    Target Date 07/15/22      PT LONG TERM GOAL #4   Title Pt will demo at least 5/5 bilat glute strength    Time 6    Period Weeks    Status New    Target Date 07/15/22                    Plan - 06/03/22 1604     Clinical Impression Statement Mr. Rush Landmark" Dollar is a 76 y/o M presenting to OPPT due to complaints of decreased balance. Pt with recent fall getting out of the bathroom. PMH significant for charcot foot (currently in crow boot), prediabetes with mild bilat foot neuropathy, and prior L rTSA. On assessment, pt demos moderate to high fall risk based on his Berg Balance Score and 5x STS. Pt demos decreased vestibular integration within his balance system. Bilat hips (L>R) are weak in proportion to pt's body habitus. All these factors together are leading into gait instability and imbalance. Pt would benefit from PT to address these issues and improve safe mobility.    Personal Factors and Comorbidities Fitness;Time since onset of injury/illness/exacerbation    Examination-Activity Limitations Locomotion Level;Transfers;Stand    Stability/Clinical Decision Making Evolving/Moderate complexity    Clinical Decision Making Moderate    Rehab Potential Good    PT Frequency 2x / week    PT Duration 6 weeks    PT Treatment/Interventions ADLs/Self Care Home Management;Aquatic Therapy;Cryotherapy;Electrical Stimulation;Iontophoresis '4mg'$ /ml Dexamethasone;Moist Heat;Ultrasound;Gait training;Stair training;Traction;Functional mobility training;Therapeutic activities;Therapeutic exercise;Neuromuscular re-education;Patient/family education;Manual techniques;Dry needling;Taping;Balance training    PT Next Visit Plan Initiate bilat hip strengthening and balance  exercises (focus on narrow BOS and vestibular)    PT Home Exercise Plan [id number] (for shoulder).    Consulted and Agree with Plan of Care Patient             Patient will benefit from skilled therapeutic intervention in order to improve the following deficits and impairments:  Decreased range of motion, Decreased activity tolerance, Decreased mobility, Decreased strength, Postural dysfunction, Abnormal gait, Difficulty walking, Increased fascial restricitons, Decreased balance  Visit Diagnosis: Unsteadiness on feet  Muscle weakness (generalized)  Other abnormalities of gait and mobility     Problem List Patient Active Problem List   Diagnosis Date Noted   Charcot foot due to diabetes mellitus ([place]) 11/16/2021   Drug rash 11/16/2021   Status post reverse total replacement of left shoulder 09/30/2021   Hyperuricemia 07/14/2021   Septic  joint of left shoulder region ([place]) 04/29/2021   Osteomyelitis of second toe of left foot (HCC)    Penicillin allergy    Chronic osteomyelitis ([place])    Diabetic foot infection ([place])    S/p total knee replacement, bilateral    Septic arthritis of shoulder, left ([place]) 03/03/2021   Atrial fibrillation, chronic ([place]) 03/03/2021   Chronic anticoagulation 03/03/2021   DM (diabetes mellitus), type 2 with neurological complications ([place]) 71/05/2693   Essential hypertension 03/03/2021   Hypokalemia 03/03/2021   Prolonged QT interval 03/03/2021   COPD (chronic obstructive pulmonary disease) ([place]) 03/03/2021   Chronic CHF (congestive heart failure) ([place]) 03/03/2021   Diabetic peripheral neuropathy ([place]) 01/03/2019   Obesity 06/01/2016   Wound dehiscence 05/04/2016   Knee osteoarthritis 04/06/2016    Anagha Loseke April Ma L [place], PT, DPT 06/03/2022, [facility] [address] [place], [name], [address] Phone: [phone]   Fax:  [phone]  Name: Willie Pineda MRN:  [id number] Date of Birth: 1946/03/21

## 2022-06-07 ENCOUNTER — Encounter: Payer: Self-pay | Admitting: Rehabilitative and Restorative Service Providers"

## 2022-06-07 ENCOUNTER — Ambulatory Visit: Payer: Medicare Other | Attending: Orthopaedic Surgery | Admitting: Rehabilitative and Restorative Service Providers"

## 2022-06-07 DIAGNOSIS — G8929 Other chronic pain: Secondary | ICD-10-CM | POA: Diagnosis not present

## 2022-06-07 DIAGNOSIS — M6281 Muscle weakness (generalized): Secondary | ICD-10-CM | POA: Diagnosis not present

## 2022-06-07 DIAGNOSIS — M25512 Pain in left shoulder: Secondary | ICD-10-CM | POA: Insufficient documentation

## 2022-06-07 DIAGNOSIS — M539 Dorsopathy, unspecified: Secondary | ICD-10-CM | POA: Insufficient documentation

## 2022-06-07 DIAGNOSIS — R2681 Unsteadiness on feet: Secondary | ICD-10-CM | POA: Insufficient documentation

## 2022-06-07 DIAGNOSIS — R1310 Dysphagia, unspecified: Secondary | ICD-10-CM | POA: Insufficient documentation

## 2022-06-07 DIAGNOSIS — I429 Cardiomyopathy, unspecified: Secondary | ICD-10-CM | POA: Diagnosis not present

## 2022-06-07 DIAGNOSIS — R293 Abnormal posture: Secondary | ICD-10-CM | POA: Insufficient documentation

## 2022-06-07 DIAGNOSIS — R29898 Other symptoms and signs involving the musculoskeletal system: Secondary | ICD-10-CM | POA: Insufficient documentation

## 2022-06-07 DIAGNOSIS — R2689 Other abnormalities of gait and mobility: Secondary | ICD-10-CM | POA: Diagnosis not present

## 2022-06-07 NOTE — Therapy (Signed)
[place], [name], [address] Phone: [phone]   Fax:  [phone]  Physical Therapy Treatment  Patient Details  Name: Willie Pineda MRN: [id number] Date of Birth: Apr 23, 1946 Referring Provider (PT): Armond Hang, MD   Encounter Date: 06/07/2022   PT End of Session - 06/07/22 0937     Visit Number 2    Number of Visits 12    Date for PT Re-Evaluation 07/15/22    Authorization Type UHC Medicare    Authorization - Visit Number 2    Progress Note Due on Visit 10    PT Start Time 0932    PT Stop Time 1015    PT Time Calculation (min) 43 min    Activity Tolerance Patient tolerated treatment well    Behavior During Therapy [facility] for tasks assessed/performed             Past Medical History:  Diagnosis Date   AICD (automatic cardioverter/defibrillator) present    Arthritis    "hands primarily" (05/05/2016)   Atrial fibrillation ([place])    Back pain    age related   CHF (congestive heart failure) ([place])    no problemes since 4196   Complication of anesthesia    Bronchial spasms   COPD (chronic obstructive pulmonary disease) ([place])    inhalers as needed   Coronary artery disease    Depression    takes Zoloft daily   Diabetic peripheral neuropathy ([place]) 01/03/2019   Drug rash 11/16/2021   Dysrhythmia    A Fib-takes Xarelto daily   Enlarged prostate    benign   GERD (gastroesophageal reflux disease)    takes Omeprazole daily   Gout    History of blood clots    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)   Hyperlipidemia    takes Atorvastatin daily   Hypertension    takes Metoprolol,HCTZ,and Losartan daily.Takes Clonidine if needed   Hyperuricemia 07/14/2021   Nocturia    Peripheral neuropathy    takes Gabapentin daily   Peripheral vascular disease (HCC)    legs blockage    Pneumonia ?2015   Presence of permanent cardiac pacemaker    Restless leg    Sleep apnea    uses  Bipap   Stroke [facility]) 2012   denies residual on 05/05/2016    Past Surgical History:  Procedure Laterality Date   AMPUTATION Left 04/03/2021   Procedure: LEFT 2ND RAY AMPUTATION;  Surgeon: Newt Minion, MD;  Location: [place];  Service: Orthopedics;  Laterality: Left;   CARPAL TUNNEL RELEASE     CATARACT EXTRACTION W/ INTRAOCULAR LENS  IMPLANT, BILATERAL Bilateral    COLONOSCOPY WITH PROPOFOL N/A 03/09/2017   Procedure: COLONOSCOPY WITH PROPOFOL;  Surgeon: Wonda Horner, MD;  Location: [facility] ENDOSCOPY;  Service: Endoscopy;  Laterality: N/A;   COLONOSCOPY WITH PROPOFOL N/A 04/03/2020   Procedure: COLONOSCOPY WITH PROPOFOL;  Surgeon: Wonda Horner, MD;  [facility] ENDOSCOPY;  Service: Endoscopy;  Laterality: N/A;   I & D KNEE WITH POLY EXCHANGE Right 05/04/2016   IRRIGATION AND DEBRIDEMENT Right KNEE WITH UNICOMPARTMENTAL POLY EXCHANGE   I & D KNEE WITH POLY EXCHANGE Right 05/04/2016   Procedure: IRRIGATION AND DEBRIDEMENT Right KNEE WITH UNICOMPARTMENTAL POLY EXCHANGE;  Surgeon: Renette Butters, MD;  Location: [place];  Service: Orthopedics;  Laterality: Right;   JOINT REPLACEMENT     LAPAROSCOPIC CHOLECYSTECTOMY  1995   PARTIAL KNEE ARTHROPLASTY Right 04/06/2016  Procedure: UNICOMPARTMENTAL RIGHT KNEE;  Surgeon: Renette Butters, MD;  Location: [place];  Service: Orthopedics;  Laterality: Right;   POLYPECTOMY  04/03/2020   Procedure: POLYPECTOMY;  Surgeon: Wonda Horner, MD;  [facility]OS[facility];  Service: Endoscopy;;   REVERSE SHOULDER ARTHROPLASTY Left 04/29/2021   Procedure: Revision antibiotic spacer placement. Incision and drainage.;  Surgeon: [name], MD;  [facility];  Service: Orthopedics;  Laterality: Left;   REVERSE SHOULDER ARTHROPLASTY Left 09/30/2021   Procedure: REVERSE SHOULDER ARTHROPLASTY;  Surgeon: [name], MD;  [facility];  Service: Orthopedics;  Laterality: Left;   STRABISMUS SURGERY Bilateral 06/10/2017   Procedure: REPAIR STRABISMUS  BILATERAL;  Surgeon: Everitt Amber, MD;  Location: [place];  Service: Ophthalmology;  Laterality: Bilateral;   TONSILLECTOMY AND ADENOIDECTOMY     TOTAL KNEE ARTHROPLASTY Left 2008   TOTAL SHOULDER ARTHROPLASTY Left 03/04/2021   Procedure: Total Shoulder Arthroplasty;  Surgeon: [name], MD;  [facility];  Service: Orthopedics;  Laterality: Left;   ULNAR NERVE TRANSPOSITION Bilateral 1989    There were no vitals filed for this visit.   Subjective Assessment - 06/07/22 0932     Subjective The patient has significant bruising in his posterior neck from a fall he had last week.    Pertinent History Lt reverse TSA; bilat TKA; pacemaker/defibulator; Lt second toe amputation; HTN; CAD; stroke; AODM; cataract sx; sleep apnea; infection Lt shoulder/knee post joint replacements; tingling Lt hand fingertips, palmar surface following carpal tunnel release and ulnar nerve release ~ 3 years ago tingling continues to improve; reports on 7/3-- h/o brainstem stroke in 2012    Patient Stated Goals get rid of the Lt shoulder pain and increase mobliity and use of Lt UE    Currently in Pain? Yes    Pain Score 2     Pain Location Neck    Pain Orientation Posterior    Pain Descriptors / Indicators Sore;Tightness    Pain Type Acute pain    Pain Onset More than a month ago    Pain Frequency Intermittent    Aggravating Factors  from recent fall    Pain Relieving Factors none                [facility] PT Assessment - 06/07/22 0939       Assessment   Medical Diagnosis R26.9 (ICD-10-CM) - Gait difficulty    Referring Provider (PT) Armond Hang, MD    Onset Date/Surgical Date 02/22/22                           [facility] Adult PT Treatment/Exercise - 06/07/22 0939       Ambulation/Gait   Ambulation/Gait Yes    Ambulation Distance (Feet) 160 Feet    Assistive device Straight cane    Ambulation Surface Level;Indoor    Gait Comments has one episode of R  foot catch with gait requiring CGA to recover when using cane      Self-Care   Self-Care Other Self-Care Comments    Other Self-Care Comments  discussed safety with home using lights at night, using device when getting up at night (he uses urinal at night),      Neuro Re-ed    Neuro Re-ed Details  Standing with head turns horiz and vertical, eyes open/ eyes closed with varying foot positions (together, then in stride position).      Exercises   Exercises Knee/Hip;Lumbar  Lumbar Exercises: Seated   Sit to Stand 10 reps   without UE support     Lumbar Exercises: Supine   Clam 10 reps    Clam Limitations one leg at a time    Bent Knee Raise 10 reps    Bent Knee Raise Limitations alternating    Bridge 10 reps      Knee/Hip Exercises: Stretches   Active Hamstring Stretch Right;Left;2 reps;30 seconds    Other Knee/Hip Stretches figure 4 stretch supine x 30 seconds x 2 reps      Knee/Hip Exercises: Standing   Knee Flexion AROM;Strengthening;Right;Left;10 reps    Hip Flexion AROM;Stengthening;Right;Left;10 reps    Hip Abduction AROM;Stengthening;Right;Left;10 reps    SLS with UE support x 3 reps each leg working 5-10 seconds    Gait Training backwards walking for glut activation    Other Standing Knee Exercises standing side stepping without UE support                          PT Long Term Goals - 06/03/22 1610       PT LONG TERM GOAL #1   Title Pt will be independent with his strength/balance HEP    Time 6    Period Weeks    Status New    Target Date 07/15/22      PT LONG TERM GOAL #2   Title Pt will have improved 5x STS to </=13 sec to demo low fall risk    Time 6    Period Weeks    Status New    Target Date 07/15/22      PT LONG TERM GOAL #3   Title Pt will have improved Berg Balance Score to at least 52/56 to demo low fall risk    Baseline 47/56    Time 6    Period Weeks    Status New    Target Date 07/15/22      PT LONG TERM GOAL #4    Title Pt will demo at least 5/5 bilat glute strength    Time 6    Period Weeks    Status New    Target Date 07/15/22                   Plan - 06/07/22 1013     Clinical Impression Statement The patient tolerates standing and supine strengthening well.  He has greater weakness inthe R leg functionally reporting h/o prior injury to R side.  PT discussed fall prevention for home, especially at night and in bathroom.  Plan to ensure he tolerated strengthening well today and will add some of these activities to HEP.    Personal Factors and Comorbidities Fitness;Time since onset of injury/illness/exacerbation    Examination-Activity Limitations Locomotion Level;Transfers;Stand    Stability/Clinical Decision Making Evolving/Moderate complexity    Rehab Potential Good    PT Frequency 2x / week    PT Duration 6 weeks    PT Treatment/Interventions ADLs/Self Care Home Management;Aquatic Therapy;Cryotherapy;Electrical Stimulation;Iontophoresis '4mg'$ /ml Dexamethasone;Moist Heat;Ultrasound;Gait training;Stair training;Traction;Functional mobility training;Therapeutic activities;Therapeutic exercise;Neuromuscular re-education;Patient/family education;Manual techniques;Dry needling;Taping;Balance training    PT Next Visit Plan Initiate bilat hip strengthening and balance exercises (focus on narrow BOS and vestibular);  ADD HEP next session based on tolerance.    PT Home Exercise Plan [id number] (for shoulder).    Consulted and Agree with Plan of Care Patient             Patient will benefit  from skilled therapeutic intervention in order to improve the following deficits and impairments:  Decreased range of motion, Decreased activity tolerance, Decreased mobility, Decreased strength, Postural dysfunction, Abnormal gait, Difficulty walking, Increased fascial restricitons, Decreased balance  Visit Diagnosis: Unsteadiness on feet  Muscle weakness (generalized)  Other abnormalities of gait and  mobility     Problem List Patient Active Problem List   Diagnosis Date Noted   Charcot foot due to diabetes mellitus ([place]) 11/16/2021   Drug rash 11/16/2021   Status post reverse total replacement of left shoulder 09/30/2021   Hyperuricemia 07/14/2021   Septic joint of left shoulder region ([place]) 04/29/2021   Osteomyelitis of second toe of left foot (HCC)    Penicillin allergy    Chronic osteomyelitis ([place])    Diabetic foot infection ([place])    S/p total knee replacement, bilateral    Septic arthritis of shoulder, left ([place]) 03/03/2021   Atrial fibrillation, chronic ([place]) 03/03/2021   Chronic anticoagulation 03/03/2021   DM (diabetes mellitus), type 2 with neurological complications ([place]) 53/29/9242   Essential hypertension 03/03/2021   Hypokalemia 03/03/2021   Prolonged QT interval 03/03/2021   COPD (chronic obstructive pulmonary disease) ([place]) 03/03/2021   Chronic CHF (congestive heart failure) ([place]) 03/03/2021   Diabetic peripheral neuropathy ([place]) 01/03/2019   Obesity 06/01/2016   Wound dehiscence 05/04/2016   Knee osteoarthritis 04/06/2016    Ura Yingling, PT 06/07/2022, 10:15 AM  [facility] [place] [address] [place], [name], [address] Phone: [phone]   Fax:  [phone]  Name: Willie Pineda MRN: [id number] Date of Birth: 1946/11/17

## 2022-06-09 ENCOUNTER — Ambulatory Visit: Payer: Medicare Other | Admitting: Physical Therapy

## 2022-06-09 DIAGNOSIS — R2689 Other abnormalities of gait and mobility: Secondary | ICD-10-CM

## 2022-06-09 DIAGNOSIS — R1310 Dysphagia, unspecified: Secondary | ICD-10-CM | POA: Diagnosis not present

## 2022-06-09 DIAGNOSIS — G8929 Other chronic pain: Secondary | ICD-10-CM | POA: Diagnosis not present

## 2022-06-09 DIAGNOSIS — R29898 Other symptoms and signs involving the musculoskeletal system: Secondary | ICD-10-CM

## 2022-06-09 DIAGNOSIS — R293 Abnormal posture: Secondary | ICD-10-CM | POA: Diagnosis not present

## 2022-06-09 DIAGNOSIS — R2681 Unsteadiness on feet: Secondary | ICD-10-CM

## 2022-06-09 DIAGNOSIS — M6281 Muscle weakness (generalized): Secondary | ICD-10-CM | POA: Diagnosis not present

## 2022-06-09 DIAGNOSIS — M25512 Pain in left shoulder: Secondary | ICD-10-CM | POA: Diagnosis not present

## 2022-06-09 DIAGNOSIS — M539 Dorsopathy, unspecified: Secondary | ICD-10-CM | POA: Diagnosis not present

## 2022-06-09 NOTE — Therapy (Signed)
[place], [name], [address] Phone: [phone]   Fax:  [phone]  Physical Therapy Treatment  Patient Details  Name: Willie Pineda MRN: [id number] Date of Birth: 1946/11/30 Referring Provider (PT): Armond Hang, MD  Rationale for Evaluation and Treatment Rehabilitation  Encounter Date: 06/09/2022   PT End of Session - 06/09/22 1232     Visit Number 3    Number of Visits 12    Date for PT Re-Evaluation 07/15/22    Authorization Type UHC Medicare    Authorization - Visit Number 3    Progress Note Due on Visit 10    PT Start Time 6283    PT Stop Time 1225    PT Time Calculation (min) 40 min    Activity Tolerance Patient tolerated treatment well    Behavior During Therapy [facility] for tasks assessed/performed             Past Medical History:  Diagnosis Date   AICD (automatic cardioverter/defibrillator) present    Arthritis    "hands primarily" (05/05/2016)   Atrial fibrillation ([place])    Back pain    age related   CHF (congestive heart failure) ([place])    no problemes since 1517   Complication of anesthesia    Bronchial spasms   COPD (chronic obstructive pulmonary disease) ([place])    inhalers as needed   Coronary artery disease    Depression    takes Zoloft daily   Diabetic peripheral neuropathy ([place]) 01/03/2019   Drug rash 11/16/2021   Dysrhythmia    A Fib-takes Xarelto daily   Enlarged prostate    benign   GERD (gastroesophageal reflux disease)    takes Omeprazole daily   Gout    History of blood clots    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)   Hyperlipidemia    takes Atorvastatin daily   Hypertension    takes Metoprolol,HCTZ,and Losartan daily.Takes Clonidine if needed   Hyperuricemia 07/14/2021   Nocturia    Peripheral neuropathy    takes Gabapentin daily   Peripheral vascular disease (HCC)    legs blockage    Pneumonia ?2015   Presence of permanent cardiac  pacemaker    Restless leg    Sleep apnea    uses Bipap   Stroke [facility]) 2012   denies residual on 05/05/2016    Past Surgical History:  Procedure Laterality Date   AMPUTATION Left 04/03/2021   Procedure: LEFT 2ND RAY AMPUTATION;  Surgeon: Newt Minion, MD;  Location: [place];  Service: Orthopedics;  Laterality: Left;   CARPAL TUNNEL RELEASE     CATARACT EXTRACTION W/ INTRAOCULAR LENS  IMPLANT, BILATERAL Bilateral    COLONOSCOPY WITH PROPOFOL N/A 03/09/2017   Procedure: COLONOSCOPY WITH PROPOFOL;  Surgeon: Wonda Horner, MD;  Location: [facility] ENDOSCOPY;  Service: Endoscopy;  Laterality: N/A;   COLONOSCOPY WITH PROPOFOL N/A 04/03/2020   Procedure: COLONOSCOPY WITH PROPOFOL;  Surgeon: Wonda Horner, MD;  [facility];  Service: Endoscopy;  Laterality: N/A;   I & D KNEE WITH POLY EXCHANGE Right 05/04/2016   IRRIGATION AND DEBRIDEMENT Right KNEE WITH UNICOMPARTMENTAL POLY EXCHANGE   I & D KNEE WITH POLY EXCHANGE Right 05/04/2016   Procedure: IRRIGATION AND DEBRIDEMENT Right KNEE WITH UNICOMPARTMENTAL POLY EXCHANGE;  Surgeon: Renette Butters, MD;  Location: [place];  Service: Orthopedics;  Laterality: Right;   JOINT REPLACEMENT     LAPAROSCOPIC CHOLECYSTECTOMY  1995   PARTIAL  KNEE ARTHROPLASTY Right 04/06/2016   Procedure: UNICOMPARTMENTAL RIGHT KNEE;  Surgeon: Renette Butters, MD;  Location: [place];  Service: Orthopedics;  Laterality: Right;   POLYPECTOMY  04/03/2020   Procedure: POLYPECTOMY;  Surgeon: Wonda Horner, MD;  [facility];  Service: Endoscopy;;   REVERSE SHOULDER ARTHROPLASTY Left 04/29/2021   Procedure: Revision antibiotic spacer placement. Incision and drainage.;  Surgeon: [name], MD;  [facility];  Service: Orthopedics;  Laterality: Left;   REVERSE SHOULDER ARTHROPLASTY Left 09/30/2021   Procedure: REVERSE SHOULDER ARTHROPLASTY;  Surgeon: [name], MD;  [facility];  Service: Orthopedics;  Laterality: Left;   STRABISMUS SURGERY Bilateral  06/10/2017   Procedure: REPAIR STRABISMUS BILATERAL;  Surgeon: Everitt Amber, MD;  Location: [place];  Service: Ophthalmology;  Laterality: Bilateral;   TONSILLECTOMY AND ADENOIDECTOMY     TOTAL KNEE ARTHROPLASTY Left 2008   TOTAL SHOULDER ARTHROPLASTY Left 03/04/2021   Procedure: Total Shoulder Arthroplasty;  Surgeon: [name], MD;  [facility];  Service: Orthopedics;  Laterality: Left;   ULNAR NERVE TRANSPOSITION Bilateral 1989    There were no vitals filed for this visit.   Subjective Assessment - 06/09/22 1153     Subjective Pt states that he is still feeling stiff and sore from his fall.    Pertinent History Lt reverse TSA; bilat TKA; pacemaker/defibulator; Lt second toe amputation; HTN; CAD; stroke; AODM; cataract sx; sleep apnea; infection Lt shoulder/knee post joint replacements; tingling Lt hand fingertips, palmar surface following carpal tunnel release and ulnar nerve release ~ 3 years ago tingling continues to improve; reports on 7/3-- h/o brainstem stroke in 2012    Patient Stated Goals get rid of the Lt shoulder pain and increase mobliity and use of Lt UE    Pain Onset More than a month ago                [facility] PT Assessment - 06/09/22 0001       Assessment   Medical Diagnosis R26.9 (ICD-10-CM) - Gait difficulty    Referring Provider (PT) Armond Hang, MD    Onset Date/Surgical Date 02/22/22                           [facility] Adult PT Treatment/Exercise - 06/09/22 0001       Lumbar Exercises: Stretches   Passive Hamstring Stretch Right;Left;30 seconds;2 reps    Figure 4 Stretch 2 reps;30 seconds;Seated    Other Lumbar Stretch Exercise "L" stretch at counter 2x30 sec      Knee/Hip Exercises: Standing   Knee Flexion AROM;Strengthening;Right;Left;10 reps    Hip Flexion AROM;Stengthening;Right;Left;10 reps    Hip Abduction AROM;Stengthening;Right;Left;10 reps;2 sets    Hip Extension AROM;Stengthening;Right;Left;2  sets;10 reps;Knee straight                 Balance Exercises - 06/09/22 0001       Balance Exercises: Standing   Standing Eyes Closed Narrow base of support (BOS);30 secs   static standing x30 sec; head nods & head turns 2x30 sec each   Partial Tandem Stance Eyes open;2 reps;30 secs                     PT Long Term Goals - 06/03/22 1610       PT LONG TERM GOAL #1   Title Pt will be independent with his strength/balance HEP    Time 6  Period Weeks    Status New    Target Date 07/15/22      PT LONG TERM GOAL #2   Title Pt will have improved 5x STS to </=13 sec to demo low fall risk    Time 6    Period Weeks    Status New    Target Date 07/15/22      PT LONG TERM GOAL #3   Title Pt will have improved Berg Balance Score to at least 52/56 to demo low fall risk    Baseline 47/56    Time 6    Period Weeks    Status New    Target Date 07/15/22      PT LONG TERM GOAL #4   Title Pt will demo at least 5/5 bilat glute strength    Time 6    Period Weeks    Status New    Target Date 07/15/22                   Plan - 06/09/22 1218     Clinical Impression Statement Pt with continued tightness/soreness from his fall. Provided stretches and initiated balance exercises for HEP. Continued to work on strengthening this session. Pt with noted decreased endurance. Required sitting rest breaks for his back.    Personal Factors and Comorbidities Fitness;Time since onset of injury/illness/exacerbation    Examination-Activity Limitations Locomotion Level;Transfers;Stand    Stability/Clinical Decision Making Evolving/Moderate complexity    Rehab Potential Good    PT Frequency 2x / week    PT Duration 6 weeks    PT Treatment/Interventions ADLs/Self Care Home Management;Aquatic Therapy;Cryotherapy;Electrical Stimulation;Iontophoresis '4mg'$ /ml Dexamethasone;Moist Heat;Ultrasound;Gait training;Stair training;Traction;Functional mobility training;Therapeutic  activities;Therapeutic exercise;Neuromuscular re-education;Patient/family education;Manual techniques;Dry needling;Taping;Balance training    PT Next Visit Plan Initiate bilat hip strengthening and balance exercises (focus on narrow BOS and vestibular);    PT Home Exercise Plan [id number] (for shoulder).  [id number] (for balance and LEs)    Consulted and Agree with Plan of Care Patient             Patient will benefit from skilled therapeutic intervention in order to improve the following deficits and impairments:  Decreased range of motion, Decreased activity tolerance, Decreased mobility, Decreased strength, Postural dysfunction, Abnormal gait, Difficulty walking, Increased fascial restricitons, Decreased balance  Visit Diagnosis: Unsteadiness on feet  Muscle weakness (generalized)  Other abnormalities of gait and mobility  Abnormal posture  Other symptoms and signs involving the musculoskeletal system     Problem List Patient Active Problem List   Diagnosis Date Noted   Charcot foot due to diabetes mellitus ([employer]) 11/16/2021   Drug rash 11/16/2021   Status post reverse total replacement of left shoulder 09/30/2021   Hyperuricemia 07/14/2021   Septic joint of left shoulder region ([place]) 04/29/2021   Osteomyelitis of second toe of left foot (HCC)    Penicillin allergy    Chronic osteomyelitis ([place])    Diabetic foot infection ([place])    S/p total knee replacement, bilateral    Septic arthritis of shoulder, left ([place]) 03/03/2021   Atrial fibrillation, chronic ([place]) 03/03/2021   Chronic anticoagulation 03/03/2021   DM (diabetes mellitus), type 2 with neurological complications ([place]) 53/61/4431   Essential hypertension 03/03/2021   Hypokalemia 03/03/2021   Prolonged QT interval 03/03/2021   COPD (chronic obstructive pulmonary disease) ([place]) 03/03/2021   Chronic CHF (congestive heart failure) ([place]) 03/03/2021   Diabetic peripheral neuropathy ([place]) 01/03/2019   Obesity 06/01/2016    Wound dehiscence 05/04/2016  Knee osteoarthritis 04/06/2016    [facility] April Ma L [place], [place], DPT 06/09/2022, 12:34 PM  [facility] [place], [name], [address] Phone: [phone]   Fax:  [phone]  Name: Willie Pineda MRN: [id number] Date of Birth: February 07, 1946

## 2022-06-10 DIAGNOSIS — I4821 Permanent atrial fibrillation: Secondary | ICD-10-CM | POA: Diagnosis not present

## 2022-06-10 DIAGNOSIS — E785 Hyperlipidemia, unspecified: Secondary | ICD-10-CM | POA: Diagnosis not present

## 2022-06-10 DIAGNOSIS — R9431 Abnormal electrocardiogram [ECG] [EKG]: Secondary | ICD-10-CM | POA: Diagnosis not present

## 2022-06-10 DIAGNOSIS — I5022 Chronic systolic (congestive) heart failure: Secondary | ICD-10-CM | POA: Diagnosis not present

## 2022-06-10 DIAGNOSIS — I110 Hypertensive heart disease with heart failure: Secondary | ICD-10-CM | POA: Diagnosis not present

## 2022-06-10 DIAGNOSIS — I4819 Other persistent atrial fibrillation: Secondary | ICD-10-CM | POA: Diagnosis not present

## 2022-06-10 DIAGNOSIS — E119 Type 2 diabetes mellitus without complications: Secondary | ICD-10-CM | POA: Diagnosis not present

## 2022-06-10 DIAGNOSIS — Z8673 Personal history of transient ischemic attack (TIA), and cerebral infarction without residual deficits: Secondary | ICD-10-CM | POA: Diagnosis not present

## 2022-06-10 DIAGNOSIS — G4733 Obstructive sleep apnea (adult) (pediatric): Secondary | ICD-10-CM | POA: Diagnosis not present

## 2022-06-10 DIAGNOSIS — Z91148 Patient's other noncompliance with medication regimen for other reason: Secondary | ICD-10-CM | POA: Diagnosis not present

## 2022-06-10 DIAGNOSIS — I083 Combined rheumatic disorders of mitral, aortic and tricuspid valves: Secondary | ICD-10-CM | POA: Diagnosis not present

## 2022-06-10 DIAGNOSIS — J449 Chronic obstructive pulmonary disease, unspecified: Secondary | ICD-10-CM | POA: Diagnosis not present

## 2022-06-10 DIAGNOSIS — I4891 Unspecified atrial fibrillation: Secondary | ICD-10-CM | POA: Diagnosis not present

## 2022-06-13 DIAGNOSIS — I4510 Unspecified right bundle-branch block: Secondary | ICD-10-CM | POA: Diagnosis not present

## 2022-06-13 DIAGNOSIS — Z79899 Other long term (current) drug therapy: Secondary | ICD-10-CM | POA: Diagnosis not present

## 2022-06-13 DIAGNOSIS — R404 Transient alteration of awareness: Secondary | ICD-10-CM | POA: Diagnosis not present

## 2022-06-13 DIAGNOSIS — E860 Dehydration: Secondary | ICD-10-CM | POA: Diagnosis not present

## 2022-06-13 DIAGNOSIS — R55 Syncope and collapse: Secondary | ICD-10-CM | POA: Diagnosis not present

## 2022-06-13 DIAGNOSIS — Z743 Need for continuous supervision: Secondary | ICD-10-CM | POA: Diagnosis not present

## 2022-06-13 DIAGNOSIS — R6889 Other general symptoms and signs: Secondary | ICD-10-CM | POA: Diagnosis not present

## 2022-06-13 DIAGNOSIS — Z7901 Long term (current) use of anticoagulants: Secondary | ICD-10-CM | POA: Diagnosis not present

## 2022-06-13 DIAGNOSIS — Z950 Presence of cardiac pacemaker: Secondary | ICD-10-CM | POA: Diagnosis not present

## 2022-06-13 DIAGNOSIS — G4733 Obstructive sleep apnea (adult) (pediatric): Secondary | ICD-10-CM | POA: Diagnosis not present

## 2022-06-13 DIAGNOSIS — Z882 Allergy status to sulfonamides status: Secondary | ICD-10-CM | POA: Diagnosis not present

## 2022-06-13 DIAGNOSIS — R569 Unspecified convulsions: Secondary | ICD-10-CM | POA: Diagnosis not present

## 2022-06-13 DIAGNOSIS — Z885 Allergy status to narcotic agent status: Secondary | ICD-10-CM | POA: Diagnosis not present

## 2022-06-13 DIAGNOSIS — I493 Ventricular premature depolarization: Secondary | ICD-10-CM | POA: Diagnosis not present

## 2022-06-13 DIAGNOSIS — R9082 White matter disease, unspecified: Secondary | ICD-10-CM | POA: Diagnosis not present

## 2022-06-13 DIAGNOSIS — R4020 Unspecified coma: Secondary | ICD-10-CM | POA: Diagnosis not present

## 2022-06-13 DIAGNOSIS — Z888 Allergy status to other drugs, medicaments and biological substances status: Secondary | ICD-10-CM | POA: Diagnosis not present

## 2022-06-13 DIAGNOSIS — E119 Type 2 diabetes mellitus without complications: Secondary | ICD-10-CM | POA: Diagnosis not present

## 2022-06-13 DIAGNOSIS — R930 Abnormal findings on diagnostic imaging of skull and head, not elsewhere classified: Secondary | ICD-10-CM | POA: Diagnosis not present

## 2022-06-13 DIAGNOSIS — R519 Headache, unspecified: Secondary | ICD-10-CM | POA: Diagnosis not present

## 2022-06-13 DIAGNOSIS — Z7985 Long-term (current) use of injectable non-insulin antidiabetic drugs: Secondary | ICD-10-CM | POA: Diagnosis not present

## 2022-06-13 DIAGNOSIS — Z87891 Personal history of nicotine dependence: Secondary | ICD-10-CM | POA: Diagnosis not present

## 2022-06-13 DIAGNOSIS — I110 Hypertensive heart disease with heart failure: Secondary | ICD-10-CM | POA: Diagnosis not present

## 2022-06-13 DIAGNOSIS — Z8673 Personal history of transient ischemic attack (TIA), and cerebral infarction without residual deficits: Secondary | ICD-10-CM | POA: Diagnosis not present

## 2022-06-13 DIAGNOSIS — I428 Other cardiomyopathies: Secondary | ICD-10-CM | POA: Diagnosis not present

## 2022-06-13 DIAGNOSIS — I4821 Permanent atrial fibrillation: Secondary | ICD-10-CM | POA: Diagnosis not present

## 2022-06-13 DIAGNOSIS — I6782 Cerebral ischemia: Secondary | ICD-10-CM | POA: Diagnosis not present

## 2022-06-13 DIAGNOSIS — I517 Cardiomegaly: Secondary | ICD-10-CM | POA: Diagnosis not present

## 2022-06-13 DIAGNOSIS — I5022 Chronic systolic (congestive) heart failure: Secondary | ICD-10-CM | POA: Diagnosis not present

## 2022-06-14 DIAGNOSIS — Z95810 Presence of automatic (implantable) cardiac defibrillator: Secondary | ICD-10-CM | POA: Diagnosis not present

## 2022-06-14 DIAGNOSIS — R569 Unspecified convulsions: Secondary | ICD-10-CM | POA: Diagnosis not present

## 2022-06-15 ENCOUNTER — Ambulatory Visit: Payer: Medicare Other | Admitting: Physical Therapy

## 2022-06-16 DIAGNOSIS — I428 Other cardiomyopathies: Secondary | ICD-10-CM | POA: Diagnosis not present

## 2022-06-16 DIAGNOSIS — I2510 Atherosclerotic heart disease of native coronary artery without angina pectoris: Secondary | ICD-10-CM | POA: Diagnosis not present

## 2022-06-16 DIAGNOSIS — I4510 Unspecified right bundle-branch block: Secondary | ICD-10-CM | POA: Diagnosis not present

## 2022-06-16 DIAGNOSIS — Z8673 Personal history of transient ischemic attack (TIA), and cerebral infarction without residual deficits: Secondary | ICD-10-CM | POA: Diagnosis not present

## 2022-06-16 DIAGNOSIS — I5022 Chronic systolic (congestive) heart failure: Secondary | ICD-10-CM | POA: Diagnosis not present

## 2022-06-16 DIAGNOSIS — I482 Chronic atrial fibrillation, unspecified: Secondary | ICD-10-CM | POA: Diagnosis not present

## 2022-06-16 DIAGNOSIS — I10 Essential (primary) hypertension: Secondary | ICD-10-CM | POA: Diagnosis not present

## 2022-06-18 ENCOUNTER — Encounter: Payer: Medicare Other | Admitting: Physical Therapy

## 2022-06-18 DIAGNOSIS — R569 Unspecified convulsions: Secondary | ICD-10-CM | POA: Diagnosis not present

## 2022-06-18 DIAGNOSIS — I10 Essential (primary) hypertension: Secondary | ICD-10-CM | POA: Diagnosis not present

## 2022-06-21 DIAGNOSIS — S060X9A Concussion with loss of consciousness of unspecified duration, initial encounter: Secondary | ICD-10-CM | POA: Diagnosis not present

## 2022-06-21 DIAGNOSIS — E0840 Diabetes mellitus due to underlying condition with diabetic neuropathy, unspecified: Secondary | ICD-10-CM | POA: Diagnosis not present

## 2022-06-22 ENCOUNTER — Ambulatory Visit: Payer: Medicare Other | Admitting: Physical Therapy

## 2022-06-22 DIAGNOSIS — M6281 Muscle weakness (generalized): Secondary | ICD-10-CM | POA: Diagnosis not present

## 2022-06-22 DIAGNOSIS — R2681 Unsteadiness on feet: Secondary | ICD-10-CM | POA: Diagnosis not present

## 2022-06-22 DIAGNOSIS — M25512 Pain in left shoulder: Secondary | ICD-10-CM | POA: Diagnosis not present

## 2022-06-22 DIAGNOSIS — R29898 Other symptoms and signs involving the musculoskeletal system: Secondary | ICD-10-CM | POA: Diagnosis not present

## 2022-06-22 DIAGNOSIS — G8929 Other chronic pain: Secondary | ICD-10-CM | POA: Diagnosis not present

## 2022-06-22 DIAGNOSIS — R293 Abnormal posture: Secondary | ICD-10-CM | POA: Diagnosis not present

## 2022-06-22 DIAGNOSIS — M539 Dorsopathy, unspecified: Secondary | ICD-10-CM | POA: Diagnosis not present

## 2022-06-22 DIAGNOSIS — R2689 Other abnormalities of gait and mobility: Secondary | ICD-10-CM | POA: Diagnosis not present

## 2022-06-22 DIAGNOSIS — R1310 Dysphagia, unspecified: Secondary | ICD-10-CM | POA: Diagnosis not present

## 2022-06-22 NOTE — Therapy (Signed)
[place], [name], [address] Phone: [phone]   Fax:  [phone]  Physical Therapy Treatment  Patient Details  Name: Willie Pineda MRN: [id number] Date of Birth: Jun 03, 1946 Referring Provider (PT): Armond Hang, MD  Rationale for Evaluation and Treatment Rehabilitation  Encounter Date: 06/22/2022   PT End of Session - 06/22/22 1145     Visit Number 4    Number of Visits 12    Date for PT Re-Evaluation 07/15/22    Authorization Type UHC Medicare    Authorization - Visit Number 4    Progress Note Due on Visit 10    PT Start Time 7672    PT Stop Time 1225    PT Time Calculation (min) 40 min    Activity Tolerance Patient tolerated treatment well    Behavior During Therapy [facility] for tasks assessed/performed             Past Medical History:  Diagnosis Date   AICD (automatic cardioverter/defibrillator) present    Arthritis    "hands primarily" (05/05/2016)   Atrial fibrillation ([place])    Back pain    age related   CHF (congestive heart failure) ([place])    no problemes since 0947   Complication of anesthesia    Bronchial spasms   COPD (chronic obstructive pulmonary disease) ([place])    inhalers as needed   Coronary artery disease    Depression    takes Zoloft daily   Diabetic peripheral neuropathy ([place]) 01/03/2019   Drug rash 11/16/2021   Dysrhythmia    A Fib-takes Xarelto daily   Enlarged prostate    benign   GERD (gastroesophageal reflux disease)    takes Omeprazole daily   Gout    History of blood clots    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)   Hyperlipidemia    takes Atorvastatin daily   Hypertension    takes Metoprolol,HCTZ,and Losartan daily.Takes Clonidine if needed   Hyperuricemia 07/14/2021   Nocturia    Peripheral neuropathy    takes Gabapentin daily   Peripheral vascular disease (HCC)    legs blockage    Pneumonia ?2015   Presence of permanent  cardiac pacemaker    Restless leg    Sleep apnea    uses Bipap   Stroke [facility]) 2012   denies residual on 05/05/2016    Past Surgical History:  Procedure Laterality Date   AMPUTATION Left 04/03/2021   Procedure: LEFT 2ND RAY AMPUTATION;  Surgeon: Newt Minion, MD;  Location: [place];  Service: Orthopedics;  Laterality: Left;   CARPAL TUNNEL RELEASE     CATARACT EXTRACTION W/ INTRAOCULAR LENS  IMPLANT, BILATERAL Bilateral    COLONOSCOPY WITH PROPOFOL N/A 03/09/2017   Procedure: COLONOSCOPY WITH PROPOFOL;  Surgeon: Wonda Horner, MD;  Location: [facility] ENDOSCOPY;  Service: Endoscopy;  Laterality: N/A;   COLONOSCOPY WITH PROPOFOL N/A 04/03/2020   Procedure: COLONOSCOPY WITH PROPOFOL;  Surgeon: Wonda Horner, MD;  Location: [facility] ENDOSCOPY;  Service: Endoscopy;  Laterality: N/A;   I & D KNEE WITH POLY EXCHANGE Right 05/04/2016   IRRIGATION AND DEBRIDEMENT Right KNEE WITH UNICOMPARTMENTAL POLY EXCHANGE   I & D KNEE WITH POLY EXCHANGE Right 05/04/2016   Procedure: IRRIGATION AND DEBRIDEMENT Right KNEE WITH UNICOMPARTMENTAL POLY EXCHANGE;  Surgeon: Renette Butters, MD;  Location: [place];  Service: Orthopedics;  Laterality: Right;   JOINT REPLACEMENT     LAPAROSCOPIC CHOLECYSTECTOMY  1995   PARTIAL  KNEE ARTHROPLASTY Right 04/06/2016   Procedure: UNICOMPARTMENTAL RIGHT KNEE;  Surgeon: Renette Butters, MD;  Location: [place];  Service: Orthopedics;  Laterality: Right;   POLYPECTOMY  04/03/2020   Procedure: POLYPECTOMY;  Surgeon: Wonda Horner, MD;  Location: [facility] ENDOSCOPY;  Service: Endoscopy;;   REVERSE SHOULDER ARTHROPLASTY Left 04/29/2021   Procedure: Revision antibiotic spacer placement. Incision and drainage.;  Surgeon: [name], MD;  Location: [facility];  Service: Orthopedics;  Laterality: Left;   REVERSE SHOULDER ARTHROPLASTY Left 09/30/2021   Procedure: REVERSE SHOULDER ARTHROPLASTY;  Surgeon: [name], MD;  [facility]: [facility];  Service: Orthopedics;  Laterality: Left;   STRABISMUS SURGERY  Bilateral 06/10/2017   Procedure: REPAIR STRABISMUS BILATERAL;  Surgeon: Everitt Amber, MD;  Location: [place];  Service: Ophthalmology;  Laterality: Bilateral;   TONSILLECTOMY AND ADENOIDECTOMY     TOTAL KNEE ARTHROPLASTY Left 2008   TOTAL SHOULDER ARTHROPLASTY Left 03/04/2021   Procedure: Total Shoulder Arthroplasty;  Surgeon: [name], MD;  [facility]: [facility];  Service: Orthopedics;  Laterality: Left;   ULNAR NERVE TRANSPOSITION Bilateral 1989    There were no vitals filed for this visit.   Subjective Assessment - 06/22/22 1151     Subjective Pt states no heart attack or stroke but had a bad headache and went out while sitting on the porch with his wife. Best guess is pt had a seizure. Pt is now on Keppra. Pt states he is feeling less sore.    Pertinent History Lt reverse TSA; bilat TKA; pacemaker/defibulator; Lt second toe amputation; HTN; CAD; stroke; AODM; cataract sx; sleep apnea; infection Lt shoulder/knee post joint replacements; tingling Lt hand fingertips, palmar surface following carpal tunnel release and ulnar nerve release ~ 3 years ago tingling continues to improve; reports on 7/3-- h/o brainstem stroke in 2012    Patient Stated Goals get rid of the Lt shoulder pain and increase mobliity and use of Lt UE    Currently in Pain? No/denies    Pain Onset More than a month ago                [facility] PT Assessment - 06/22/22 0001       Assessment   Medical Diagnosis R26.9 (ICD-10-CM) - Gait difficulty    Referring Provider (PT) Armond Hang, MD    Onset Date/Surgical Date 02/22/22                           [facility] Adult PT Treatment/Exercise - 06/22/22 0001       Knee/Hip Exercises: Aerobic   Nustep L5 x 5 min LEs only      Knee/Hip Exercises: Standing   Other Standing Knee Exercises foot on 6" step 2x10 red medicine ball diagonals                 Balance Exercises - 06/22/22 0001       Balance Exercises:  Standing   Standing Eyes Opened Head turns;Foam/compliant surface;2 reps;30 secs   head turns and then head nods   Standing Eyes Closed Narrow base of support (BOS);30 secs   static standing x30 sec; head nods & head turns x30 sec each; on foam x30 sec static   Tandem Stance Eyes open;30 secs;2 reps    SLS with Vectors Solid surface   tap forward 2x10 L&R on 6" step, tap to the side 2x10 L&R on 6" step   Tandem Gait Forward;4 reps;Upper extremity support  Partial Tandem Stance Eyes open;30 secs                     PT Long Term Goals - 06/03/22 1610       PT LONG TERM GOAL #1   Title Pt will be independent with his strength/balance HEP    Time 6    Period Weeks    Status New    Target Date 07/15/22      PT LONG TERM GOAL #2   Title Pt will have improved 5x STS to </=13 sec to demo low fall risk    Time 6    Period Weeks    Status New    Target Date 07/15/22      PT LONG TERM GOAL #3   Title Pt will have improved Berg Balance Score to at least 52/56 to demo low fall risk    Baseline 47/56    Time 6    Period Weeks    Status New    Target Date 07/15/22      PT LONG TERM GOAL #4   Title Pt will demo at least 5/5 bilat glute strength    Time 6    Period Weeks    Status New    Target Date 07/15/22                   Plan - 06/22/22 1235     Clinical Impression Statement Less soreness from the fall. Able to progress his balance exercises this session. LOBs with weight shifting and stabilizing on R>L LE.    Personal Factors and Comorbidities Fitness;Time since onset of injury/illness/exacerbation    Examination-Activity Limitations Locomotion Level;Transfers;Stand    Stability/Clinical Decision Making Evolving/Moderate complexity    Rehab Potential Good    PT Frequency 2x / week    PT Duration 6 weeks    PT Treatment/Interventions ADLs/Self Care Home Management;Aquatic Therapy;Cryotherapy;Electrical Stimulation;Iontophoresis '4mg'$ /ml Dexamethasone;Moist  Heat;Ultrasound;Gait training;Stair training;Traction;Functional mobility training;Therapeutic activities;Therapeutic exercise;Neuromuscular re-education;Patient/family education;Manual techniques;Dry needling;Taping;Balance training    PT Next Visit Plan Initiate bilat hip strengthening and balance exercises (focus on narrow BOS and vestibular);    PT Home Exercise Plan [id number] (for shoulder).  [id number] (for balance and LEs)    Consulted and Agree with Plan of Care Patient             Patient will benefit from skilled therapeutic intervention in order to improve the following deficits and impairments:  Decreased range of motion, Decreased activity tolerance, Decreased mobility, Decreased strength, Postural dysfunction, Abnormal gait, Difficulty walking, Increased fascial restricitons, Decreased balance  Visit Diagnosis: Unsteadiness on feet  Muscle weakness (generalized)  Other abnormalities of gait and mobility  Abnormal posture  Other symptoms and signs involving the musculoskeletal system     Problem List Patient Active Problem List   Diagnosis Date Noted   Charcot foot due to diabetes mellitus ([place]) 11/16/2021   Drug rash 11/16/2021   Status post reverse total replacement of left shoulder 09/30/2021   Hyperuricemia 07/14/2021   Septic joint of left shoulder region ([place]) 04/29/2021   Osteomyelitis of second toe of left foot (HCC)    Penicillin allergy    Chronic osteomyelitis ([place])    Diabetic foot infection ([place])    S/p total knee replacement, bilateral    Septic arthritis of shoulder, left ([place]) 03/03/2021   Atrial fibrillation, chronic ([place]) 03/03/2021   Chronic anticoagulation 03/03/2021   DM (diabetes mellitus), type 2 with neurological complications ([place]) 47/82/9562  Essential hypertension 03/03/2021   Hypokalemia 03/03/2021   Prolonged QT interval 03/03/2021   COPD (chronic obstructive pulmonary disease) ([place]) 03/03/2021   Chronic CHF (congestive heart  failure) ([place]) 03/03/2021   Diabetic peripheral neuropathy ([place]) 01/03/2019   Obesity 06/01/2016   Wound dehiscence 05/04/2016   Knee osteoarthritis 04/06/2016    Ridhima Golberg April Ma L [place], PT, DPT 06/22/2022, 1:11 PM  [facility] [place], [name], [address] Phone: [phone]   Fax:  [phone]  Name: Willie Pineda MRN: [id number] Date of Birth: [birth year]

## 2022-06-25 ENCOUNTER — Ambulatory Visit: Payer: Medicare Other | Admitting: Physical Therapy

## 2022-06-25 ENCOUNTER — Encounter: Payer: Self-pay | Admitting: Physical Therapy

## 2022-06-25 DIAGNOSIS — R293 Abnormal posture: Secondary | ICD-10-CM

## 2022-06-25 DIAGNOSIS — G8929 Other chronic pain: Secondary | ICD-10-CM | POA: Diagnosis not present

## 2022-06-25 DIAGNOSIS — R2681 Unsteadiness on feet: Secondary | ICD-10-CM | POA: Diagnosis not present

## 2022-06-25 DIAGNOSIS — R29898 Other symptoms and signs involving the musculoskeletal system: Secondary | ICD-10-CM

## 2022-06-25 DIAGNOSIS — R2689 Other abnormalities of gait and mobility: Secondary | ICD-10-CM

## 2022-06-25 DIAGNOSIS — M6281 Muscle weakness (generalized): Secondary | ICD-10-CM

## 2022-06-25 DIAGNOSIS — R1310 Dysphagia, unspecified: Secondary | ICD-10-CM | POA: Diagnosis not present

## 2022-06-25 DIAGNOSIS — M25512 Pain in left shoulder: Secondary | ICD-10-CM | POA: Diagnosis not present

## 2022-06-25 DIAGNOSIS — M539 Dorsopathy, unspecified: Secondary | ICD-10-CM | POA: Diagnosis not present

## 2022-06-25 NOTE — Therapy (Addendum)
OUTPATIENT PHYSICAL THERAPY TREATMENT NOTE   Patient Name: Willie Pineda MRN: [id number] DOB:30-Sep-1946, 76 y.o., male Today's Date: 06/25/2022  PCP: Josetta Huddle REFERRING PROVIDER: Armond Hang   PT End of Session - 06/25/22 1132     Visit Number 5    Number of Visits 12    Date for PT Re-Evaluation 07/15/22    Authorization Type UHC Medicare    Authorization - Visit Number 5    Progress Note Due on Visit 10    PT Start Time 1105    PT Stop Time 1145    PT Time Calculation (min) 40 min    Activity Tolerance Patient tolerated treatment well    Behavior During Therapy WFL for tasks assessed/performed             Past Medical History:  Diagnosis Date   AICD (automatic cardioverter/defibrillator) present    Arthritis    "hands primarily" (05/05/2016)   Atrial fibrillation ([place])    Back pain    age related   CHF (congestive heart failure) ([place])    no problemes since 8502   Complication of anesthesia    Bronchial spasms   COPD (chronic obstructive pulmonary disease) ([place])    inhalers as needed   Coronary artery disease    Depression    takes Zoloft daily   Diabetic peripheral neuropathy ([place]) 01/03/2019   Drug rash 11/16/2021   Dysrhythmia    A Fib-takes Xarelto daily   Enlarged prostate    benign   GERD (gastroesophageal reflux disease)    takes Omeprazole daily   Gout    History of blood clots    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) 2012   denies residual on 05/05/2016   Past Surgical History:  Procedure Laterality Date   AMPUTATION Left  04/03/2021   Procedure: LEFT 2ND RAY AMPUTATION;  Surgeon: Newt Minion, MD;  Location: [place];  Service: Orthopedics;  Laterality: Left;   CARPAL TUNNEL RELEASE     CATARACT EXTRACTION W/ INTRAOCULAR LENS  IMPLANT, BILATERAL Bilateral    COLONOSCOPY WITH PROPOFOL N/A 03/09/2017   Procedure: COLONOSCOPY WITH PROPOFOL;  Surgeon: Wonda Horner, MD;  Location: [facility] ENDOSCOPY;  Service: Endoscopy;  Laterality: N/A;   COLONOSCOPY WITH PROPOFOL N/A 04/03/2020   Procedure: COLONOSCOPY WITH PROPOFOL;  Surgeon: Wonda Horner, MD;  [facility];  Service: Endoscopy;  Laterality: N/A;   I & D KNEE WITH POLY EXCHANGE Right 05/04/2016   IRRIGATION AND DEBRIDEMENT Right KNEE WITH UNICOMPARTMENTAL POLY EXCHANGE   I & D KNEE WITH POLY EXCHANGE Right 05/04/2016   Procedure: IRRIGATION AND DEBRIDEMENT Right KNEE WITH UNICOMPARTMENTAL POLY EXCHANGE;  Surgeon: Renette Butters, MD;  Location: [place];  Service: Orthopedics;  Laterality: Right;   JOINT REPLACEMENT     LAPAROSCOPIC CHOLECYSTECTOMY  1995   PARTIAL KNEE ARTHROPLASTY Right 04/06/2016   Procedure: UNICOMPARTMENTAL RIGHT KNEE;  Surgeon: Renette Butters, MD;  Location: [place];  Service: Orthopedics;  Laterality: Right;  POLYPECTOMY  04/03/2020   Procedure: POLYPECTOMY;  Surgeon: Wonda Horner, MD;  [facility];  Service: Endoscopy;;   REVERSE SHOULDER ARTHROPLASTY Left 04/29/2021   Procedure: Revision antibiotic spacer placement. Incision and drainage.;  Surgeon: [name], MD;  [facility];  Service: Orthopedics;  Laterality: Left;   REVERSE SHOULDER ARTHROPLASTY Left 09/30/2021   Procedure: REVERSE SHOULDER ARTHROPLASTY;  Surgeon: [name], MD;  [facility];  Service: Orthopedics;  Laterality: Left;   STRABISMUS SURGERY Bilateral 06/10/2017   Procedure: REPAIR STRABISMUS BILATERAL;  Surgeon: Everitt Amber, MD;  Location: [place];  Service: Ophthalmology;  Laterality: Bilateral;   TONSILLECTOMY  AND ADENOIDECTOMY     TOTAL KNEE ARTHROPLASTY Left 2008   TOTAL SHOULDER ARTHROPLASTY Left 03/04/2021   Procedure: Total Shoulder Arthroplasty;  Surgeon: [name], MD;  [facility];  Service: Orthopedics;  Laterality: Left;   ULNAR NERVE TRANSPOSITION Bilateral 1989   Patient Active Problem List   Diagnosis Date Noted   Charcot foot due to diabetes mellitus ([place]) 11/16/2021   Drug rash 11/16/2021   Status post reverse total replacement of left shoulder 09/30/2021   Hyperuricemia 07/14/2021   Septic joint of left shoulder region ([place]) 04/29/2021   Osteomyelitis of second toe of left foot (HCC)    Penicillin allergy    Chronic osteomyelitis ([place])    Diabetic foot infection ([place])    S/p total knee replacement, bilateral    Septic arthritis of shoulder, left ([place]) 03/03/2021   Atrial fibrillation, chronic ([place]) 03/03/2021   Chronic anticoagulation 03/03/2021   DM (diabetes mellitus), type 2 with neurological complications ([place]) 82/99/3716   Essential hypertension 03/03/2021   Hypokalemia 03/03/2021   Prolonged QT interval 03/03/2021   COPD (chronic obstructive pulmonary disease) ([place]) 03/03/2021   Chronic CHF (congestive heart failure) ([place]) 03/03/2021   Diabetic peripheral neuropathy ([place]) 01/03/2019   Obesity 06/01/2016   Wound dehiscence 05/04/2016   Knee osteoarthritis 04/06/2016    REFERRING DIAG: R26.9 (ICD-10-CM) - Gait difficulty  THERAPY DIAG:  No diagnosis found.  Rationale for Evaluation and Treatment Rehabilitation  PERTINENT HISTORY: old L cerebellar infarct  PRECAUTIONS: Falls  SUBJECTIVE: Pt reports nothing new or different. Pt states he did well with his head nods. Steps are "pretty decent." Felt that his balance was better yesterday.   PAIN:  Are you having pain? No     TODAY'S TREATMENT:  06/25/22 Seated: LAQ 2x10 Ankle DF 2x10  Standing: Hip flexion 2x10 Hip abduction 2x10 Hip extension 2x10 Mini squat 2x10    Corner balance: EO  feet together on foam: static standing x30 sec, head nods & head turns 2x30 sec each EC feet together on foam: static standing x30 sec  6" steps:  Alternating tap forward 2x10  Tap sideways 2x10          PATIENT EDUCATION: Education details: HEP updates Person educated: Patient Education method: Explanation and Verbal cues Education comprehension: verbalized understanding   HOME EXERCISE PROGRAM: [email or web address]      PT Long Term Goals - 06/25/22 1138       PT LONG TERM GOAL #1   Title Pt will be independent with his strength/balance HEP    Time 6    Period Weeks    Status On-going    Target Date 07/15/22      PT LONG TERM GOAL #2   Title Pt will have improved 5x STS to </=13 sec to demo low fall risk    Time  6    Period Weeks    Status On-going    Target Date 07/15/22      PT LONG TERM GOAL #3   Title Pt will have improved Berg Balance Score to at least 52/56 to demo low fall risk    Baseline 47/56    Time 6    Period Weeks    Status On-going    Target Date 07/15/22      PT LONG TERM GOAL #4   Title Pt will demo at least 5/5 bilat glute strength    Time 6    Period Weeks    Status On-going    Target Date 07/15/22              Plan - 06/25/22 1121     Clinical Impression Statement Continued to work on balance exercises this session and hip strengthening.    Personal Factors and Comorbidities Fitness;Time since onset of injury/illness/exacerbation    Examination-Activity Limitations Locomotion Level;Transfers;Stand    Stability/Clinical Decision Making Evolving/Moderate complexity    Rehab Potential Good    PT Frequency 2x / week    PT Duration 6 weeks    PT Treatment/Interventions ADLs/Self Care Home Management;Aquatic Therapy;Cryotherapy;Electrical Stimulation;Iontophoresis '4mg'$ /ml Dexamethasone;Moist Heat;Ultrasound;Gait training;Stair training;Traction;Functional mobility training;Therapeutic activities;Therapeutic exercise;Neuromuscular  re-education;Patient/family education;Manual techniques;Dry needling;Taping;Balance training    PT Next Visit Plan Initiate bilat hip strengthening and balance exercises (focus on narrow BOS and vestibular);    PT Home Exercise Plan [id number] (for shoulder).  [id number] (for balance and LEs)    Consulted and Agree with Plan of Care Patient               [facility] Beatrix Shipper [place], PT, DPT 06/25/2022, 11:38 AM

## 2022-06-28 NOTE — Therapy (Signed)
OUTPATIENT PHYSICAL THERAPY TREATMENT NOTE   Patient Name: Willie Pineda MRN: [id number] DOB:10-05-1946, 76 y.o., male Today's Date: 06/29/2022  PCP: Josetta Huddle REFERRING PROVIDER: Armond Hang   PT End of Session - 06/29/22 1316     Visit Number 6    Number of Visits 12    Date for PT Re-Evaluation 07/15/22    Authorization Type UHC Medicare    Authorization - Visit Number 6    Progress Note Due on Visit 10    PT Start Time 1316    PT Stop Time 1345    PT Time Calculation (min) 29 min    Activity Tolerance Patient limited by pain    Behavior During Therapy [facility] for tasks assessed/performed              Past Medical History:  Diagnosis Date   AICD (automatic cardioverter/defibrillator) present    Arthritis    "hands primarily" (05/05/2016)   Atrial fibrillation (HCC)    Back pain    age related   CHF (congestive heart failure) (HCC)    no problemes since 5329   Complication of anesthesia    Bronchial spasms   COPD (chronic obstructive pulmonary disease) (HCC)    inhalers as needed   Coronary artery disease    Depression    takes Zoloft daily   Diabetic peripheral neuropathy ([place]) 01/03/2019   Drug rash 11/16/2021   Dysrhythmia    A Fib-takes Xarelto daily   Enlarged prostate    benign   GERD (gastroesophageal reflux disease)    takes Omeprazole daily   Gout    History of blood clots    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)   Hyperlipidemia    takes Atorvastatin daily   Hypertension    takes Metoprolol,HCTZ,and Losartan daily.Takes Clonidine if needed   Hyperuricemia 07/14/2021   Nocturia    Peripheral neuropathy    takes Gabapentin daily   Peripheral vascular disease (HCC)    legs blockage    Pneumonia ?2015   Presence of permanent cardiac pacemaker    Restless leg    Sleep apnea    uses Bipap   Stroke [facility]) 2012   denies residual on 05/05/2016   Past Surgical History:  Procedure Laterality Date   AMPUTATION Left 04/03/2021    Procedure: LEFT 2ND RAY AMPUTATION;  Surgeon: Newt Minion, MD;  Location: [place];  Service: Orthopedics;  Laterality: Left;   CARPAL TUNNEL RELEASE     CATARACT EXTRACTION W/ INTRAOCULAR LENS  IMPLANT, BILATERAL Bilateral    COLONOSCOPY WITH PROPOFOL N/A 03/09/2017   Procedure: COLONOSCOPY WITH PROPOFOL;  Surgeon: Wonda Horner, MD;  Location: [facility] ENDOSCOPY;  Service: Endoscopy;  Laterality: N/A;   COLONOSCOPY WITH PROPOFOL N/A 04/03/2020   Procedure: COLONOSCOPY WITH PROPOFOL;  Surgeon: Wonda Horner, MD;  [facility] ENDOSCOPY;  Service: Endoscopy;  Laterality: N/A;   I & D KNEE WITH POLY EXCHANGE Right 05/04/2016   IRRIGATION AND DEBRIDEMENT Right KNEE WITH UNICOMPARTMENTAL POLY EXCHANGE   I & D KNEE WITH POLY EXCHANGE Right 05/04/2016   Procedure: IRRIGATION AND DEBRIDEMENT Right KNEE WITH UNICOMPARTMENTAL POLY EXCHANGE;  Surgeon: Renette Butters, MD;  Location: [place];  Service: Orthopedics;  Laterality: Right;   JOINT REPLACEMENT     LAPAROSCOPIC CHOLECYSTECTOMY  1995   PARTIAL KNEE ARTHROPLASTY Right 04/06/2016   Procedure: UNICOMPARTMENTAL RIGHT KNEE;  Surgeon: Renette Butters, MD;  Location: [place];  Service: Orthopedics;  Laterality: Right;  POLYPECTOMY  04/03/2020   Procedure: POLYPECTOMY;  Surgeon: Wonda Horner, MD;  [facility] ENDOSCOPY;  Service: Endoscopy;;   REVERSE SHOULDER ARTHROPLASTY Left 04/29/2021   Procedure: Revision antibiotic spacer placement. Incision and drainage.;  Surgeon: [name], MD;  [facility];  Service: Orthopedics;  Laterality: Left;   REVERSE SHOULDER ARTHROPLASTY Left 09/30/2021   Procedure: REVERSE SHOULDER ARTHROPLASTY;  Surgeon: [name], MD;  [facility];  Service: Orthopedics;  Laterality: Left;   STRABISMUS SURGERY Bilateral 06/10/2017   Procedure: REPAIR STRABISMUS BILATERAL;  Surgeon: Everitt Amber, MD;  Location: [place];  Service: Ophthalmology;  Laterality: Bilateral;   TONSILLECTOMY AND  ADENOIDECTOMY     TOTAL KNEE ARTHROPLASTY Left 2008   TOTAL SHOULDER ARTHROPLASTY Left 03/04/2021   Procedure: Total Shoulder Arthroplasty;  Surgeon: [name], MD;  [facility];  Service: Orthopedics;  Laterality: Left;   ULNAR NERVE TRANSPOSITION Bilateral 1989   Patient Active Problem List   Diagnosis Date Noted   Charcot foot due to diabetes mellitus ([place]) 11/16/2021   Drug rash 11/16/2021   Status post reverse total replacement of left shoulder 09/30/2021   Hyperuricemia 07/14/2021   Septic joint of left shoulder region [facility]) 04/29/2021   Osteomyelitis of second toe of left foot (HCC)    Penicillin allergy    Chronic osteomyelitis ([place])    Diabetic foot infection ([place])    S/p total knee replacement, bilateral    Septic arthritis of shoulder, left ([place]) 03/03/2021   Atrial fibrillation, chronic ([place]) 03/03/2021   Chronic anticoagulation 03/03/2021   DM (diabetes mellitus), type 2 with neurological complications ([place]) 22/01/5426   Essential hypertension 03/03/2021   Hypokalemia 03/03/2021   Prolonged QT interval 03/03/2021   COPD (chronic obstructive pulmonary disease) ([place]) 03/03/2021   Chronic CHF (congestive heart failure) ([place]) 03/03/2021   Diabetic peripheral neuropathy ([place]) 01/03/2019   Obesity 06/01/2016   Wound dehiscence 05/04/2016   Knee osteoarthritis 04/06/2016    REFERRING DIAG: R26.9 (ICD-10-CM) - Gait difficulty  THERAPY DIAG:  Unsteadiness on feet  Muscle weakness (generalized)  Other abnormalities of gait and mobility  Abnormal posture  Other symptoms and signs involving the musculoskeletal system  Cervical dysfunction  Acute pain of left shoulder  Chronic left shoulder pain  Dysphagia, unspecified type  Rationale for Evaluation and Treatment Rehabilitation  PERTINENT HISTORY: old L cerebellar infarct  PRECAUTIONS: Falls  SUBJECTIVE: I don't know how much you're going to get out of me today. My back is really hurting.  PAIN:   Are you having pain? No and Yes: NPRS scale: 6/10 Pain location: low back Pain description: sharp and tight Aggravating factors: moving refrigerator Relieving factors: unsure  OBJECTIVE: (objective measurements taken at eval unless otherwise dated)  Strength     Strength Assessment Site Hip     Right/Left Hip Right;Left     Right Hip Flexion 5/5     Right Hip Extension 4/5     Right Hip ABduction 4/5     Left Hip Flexion 4+/5     Left Hip Extension 4-/5     Left Hip ABduction 4-/5          Transfers    Five time sit to stand comments  14 sec          Ambulation/Gait    Ambulation Distance (Feet) 90 Feet     Assistive device None   crow boot    Gait Pattern Step-through pattern;Antalgic   wide BOS  Ambulation Surface Level;Indoor          Standardized Balance Assessment    Standardized Balance Assessment Berg Balance Test          Berg Balance Test    Sit to Stand Able to stand without using hands and stabilize independently     Standing Unsupported Able to stand safely 2 minutes     Sitting with Back Unsupported but Feet Supported on Floor or Stool Able to sit safely and securely 2 minutes     Stand to Sit Sits safely with minimal use of hands     Transfers Able to transfer safely, minor use of hands     Standing Unsupported with Eyes Closed Able to stand 10 seconds with supervision     Standing Unsupported with Feet Together Able to place feet together independently and stand 1 minute safely     From Standing, Reach Forward with Outstretched Arm Can reach forward >5 cm safely (2")     From Standing Position, Pick up Object from Floor Able to pick up shoe safely and easily     From Standing Position, Turn to Look Behind Over each Shoulder Looks behind from both sides and weight shifts well     Turn 360 Degrees Able to turn 360 degrees safely one side only in 4 seconds or less     Standing Unsupported, Alternately Place Feet on Step/Stool Able to stand independently and  complete 8 steps >20 seconds     Standing Unsupported, One Foot in Front Able to plae foot ahead of the other independently and hold 30 seconds     Standing on One Leg Tries to lift leg/unable to hold 3 seconds but remains standing independently     Total Score 47     Berg comment: 47/56          High Level Balance    High Level Balance Comments Standing on foam: EO 30 sec; EC 30 sec but unsteady with 1 minor LOB; On firm surface: EO 30 sec, EC 30 sec     TODAY'S TREATMENT:  06/29/22 Nustep L5 x 6 min Stretches: Doorway stretch 2x30 sec for R QL/lumbar Forward flexion stretch 2x30sec with R and L x 5 sec ea also  Corner balance: EO feet together on foam: static standing x30 sec, head nods & head turns 2x30 sec each EC feet together on foam: static standing x20 sec limited by back pain today    06/25/22 Seated: LAQ 2x10 Ankle DF 2x10  Standing: Hip flexion 2x10 Hip abduction 2x10 Hip extension 2x10 Mini squat 2x10    Corner balance: EO feet together on foam: static standing x30 sec, head nods & head turns 2x30 sec each EC feet together on foam: static standing x30 sec  6" steps:  Alternating tap forward 2x10  Tap sideways 2x10          PATIENT EDUCATION: Education details: HEP updates Person educated: Patient Education method: Explanation and Verbal cues Education comprehension: verbalized understanding   HOME EXERCISE PROGRAM: [email or web address]      PT Long Term Goals - 06/25/22 1138       PT LONG TERM GOAL #1   Title Pt will be independent with his strength/balance HEP    Time 6    Period Weeks    Status On-going    Target Date 07/15/22      PT LONG TERM GOAL #2   Title Pt will have improved 5x STS to </=13 sec to  demo low fall risk    Time 6    Period Weeks    Status On-going    Target Date 07/15/22      PT LONG TERM GOAL #3   Title Pt will have improved Berg Balance Score to at least 52/56 to demo low fall risk    Baseline 47/56    Time 6     Period Weeks    Status On-going    Target Date 07/15/22      PT LONG TERM GOAL #4   Title Pt will demo at least 5/5 bilat glute strength    Time 6    Period Weeks    Status On-going    Target Date 07/15/22              Assessment/Plan -       Clinical Impression Statement Rush Landmark presents today with c/o of R low back pain after moving a refrigerator this past weekend. He was able to tolerate 30 min of TE and balance activities but then had to stop due to pain. Pt presents with wearing the "even up" shoe and demo's improved posture with gait.     Personal Factors and Comorbidities Fitness;Time since onset of injury/illness/exacerbation     Examination-Activity Limitations Locomotion Level;Transfers;Stand     Stability/Clinical Decision Making Evolving/Moderate complexity     Clinical Decision Making Moderate     Rehab Potential Good     PT Frequency 2x / week     PT Duration 6 weeks     PT Treatment/Interventions ADLs/Self Care Home Management;Aquatic Therapy;Cryotherapy;Electrical Stimulation;Iontophoresis '4mg'$ /ml Dexamethasone;Moist Heat;Ultrasound;Gait training;Stair training;Traction;Functional mobility training;Therapeutic activities;Therapeutic exercise;Neuromuscular re-education;Patient/family education;Manual techniques;Dry needling;Taping;Balance training     PT Next Visit Plan Initiate bilat hip strengthening and balance exercises (focus on narrow BOS and vestibular)     PT Home Exercise Plan [email or web address] (for shoulder).     Consulted and Agree with Plan of Care Patient       Ysabel Stankovich, PT,  06/29/2022, 2:00 PM

## 2022-06-29 ENCOUNTER — Encounter: Payer: Self-pay | Admitting: Physical Therapy

## 2022-06-29 ENCOUNTER — Ambulatory Visit: Payer: Medicare Other | Admitting: Physical Therapy

## 2022-06-29 DIAGNOSIS — M6281 Muscle weakness (generalized): Secondary | ICD-10-CM

## 2022-06-29 DIAGNOSIS — R29898 Other symptoms and signs involving the musculoskeletal system: Secondary | ICD-10-CM | POA: Diagnosis not present

## 2022-06-29 DIAGNOSIS — M539 Dorsopathy, unspecified: Secondary | ICD-10-CM

## 2022-06-29 DIAGNOSIS — R2689 Other abnormalities of gait and mobility: Secondary | ICD-10-CM | POA: Diagnosis not present

## 2022-06-29 DIAGNOSIS — R1310 Dysphagia, unspecified: Secondary | ICD-10-CM

## 2022-06-29 DIAGNOSIS — G8929 Other chronic pain: Secondary | ICD-10-CM

## 2022-06-29 DIAGNOSIS — R293 Abnormal posture: Secondary | ICD-10-CM | POA: Diagnosis not present

## 2022-06-29 DIAGNOSIS — M25512 Pain in left shoulder: Secondary | ICD-10-CM

## 2022-06-29 DIAGNOSIS — R2681 Unsteadiness on feet: Secondary | ICD-10-CM | POA: Diagnosis not present

## 2022-06-30 ENCOUNTER — Ambulatory Visit: Payer: Self-pay | Admitting: Licensed Clinical Social Worker

## 2022-06-30 NOTE — Therapy (Signed)
OUTPATIENT PHYSICAL THERAPY TREATMENT NOTE   Patient Name: Willie Pineda MRN: [id number] DOB:February 11, 1946, 76 y.o., male Today's Date: 07/01/2022  PCP: Josetta Huddle REFERRING PROVIDER: Armond Hang   PT End of Session - 07/01/22 1146     Visit Number 7    Number of Visits 12    Date for PT Re-Evaluation 07/15/22    Authorization Type UHC Medicare    Authorization - Visit Number 7    Progress Note Due on Visit 10    PT Start Time 1147    PT Stop Time 1229    PT Time Calculation (min) 42 min    Activity Tolerance Patient limited by pain    Behavior During Therapy [facility] for tasks assessed/performed               Past Medical History:  Diagnosis Date   AICD (automatic cardioverter/defibrillator) present    Arthritis    "hands primarily" (05/05/2016)   Atrial fibrillation ([place])    Back pain    age related   CHF (congestive heart failure) ([place])    no problemes since 9169   Complication of anesthesia    Bronchial spasms   COPD (chronic obstructive pulmonary disease) ([place])    inhalers as needed   Coronary artery disease    Depression    takes Zoloft daily   Diabetic peripheral neuropathy ([place]) 01/03/2019   Drug rash 11/16/2021   Dysrhythmia    A Fib-takes Xarelto daily   Enlarged prostate    benign   GERD (gastroesophageal reflux disease)    takes Omeprazole daily   Gout    History of blood clots    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)   Hyperlipidemia    takes Atorvastatin daily   Hypertension    takes Metoprolol,HCTZ,and Losartan daily.Takes Clonidine if needed   Hyperuricemia 07/14/2021   Nocturia    Peripheral neuropathy    takes Gabapentin daily   Peripheral vascular disease (HCC)    legs blockage    Pneumonia ?2015   Presence of permanent cardiac pacemaker    Restless leg    Sleep apnea    uses Bipap   Stroke [facility]) 2012   denies residual on 05/05/2016   Past Surgical History:  Procedure Laterality Date   AMPUTATION Left  04/03/2021   Procedure: LEFT 2ND RAY AMPUTATION;  Surgeon: Newt Minion, MD;  Location: [place];  Service: Orthopedics;  Laterality: Left;   CARPAL TUNNEL RELEASE     CATARACT EXTRACTION W/ INTRAOCULAR LENS  IMPLANT, BILATERAL Bilateral    COLONOSCOPY WITH PROPOFOL N/A 03/09/2017   Procedure: COLONOSCOPY WITH PROPOFOL;  Surgeon: Wonda Horner, MD;  Location[facility] ENDOSCOPY;  Service: Endoscopy;  Laterality: N/A;   COLONOSCOPY WITH PROPOFOL N/A 04/03/2020   Procedure: COLONOSCOPY WITH PROPOFOL;  Surgeon: Wonda Horner, MD;  [facility]OS[facility];  Service: Endoscopy;  Laterality: N/A;   I & D KNEE WITH POLY EXCHANGE Right 05/04/2016   IRRIGATION AND DEBRIDEMENT Right KNEE WITH UNICOMPARTMENTAL POLY EXCHANGE   I & D KNEE WITH POLY EXCHANGE Right 05/04/2016   Procedure: IRRIGATION AND DEBRIDEMENT Right KNEE WITH UNICOMPARTMENTAL POLY EXCHANGE;  Surgeon: Renette Butters, MD;  Location: [place];  Service: Orthopedics;  Laterality: Right;   JOINT REPLACEMENT     LAPAROSCOPIC CHOLECYSTECTOMY  1995   PARTIAL KNEE ARTHROPLASTY Right 04/06/2016   Procedure: UNICOMPARTMENTAL RIGHT KNEE;  Surgeon: Renette Butters, MD;  Location: [place];  Service: Orthopedics;  Laterality: Right;  POLYPECTOMY  04/03/2020   Procedure: POLYPECTOMY;  Surgeon: Wonda Horner, MD;  [facility];  Service: Endoscopy;;   REVERSE SHOULDER ARTHROPLASTY Left 04/29/2021   Procedure: Revision antibiotic spacer placement. Incision and drainage.;  Surgeon: [name], MD;  [facility];  Service: Orthopedics;  Laterality: Left;   REVERSE SHOULDER ARTHROPLASTY Left 09/30/2021   Procedure: REVERSE SHOULDER ARTHROPLASTY;  Surgeon: [name], MD;  [facility];  Service: Orthopedics;  Laterality: Left;   STRABISMUS SURGERY Bilateral 06/10/2017   Procedure: REPAIR STRABISMUS BILATERAL;  Surgeon: Everitt Amber, MD;  Location: [place];  Service: Ophthalmology;  Laterality: Bilateral;   TONSILLECTOMY  AND ADENOIDECTOMY     TOTAL KNEE ARTHROPLASTY Left 2008   TOTAL SHOULDER ARTHROPLASTY Left 03/04/2021   Procedure: Total Shoulder Arthroplasty;  Surgeon: [name], MD;  [facility];  Service: Orthopedics;  Laterality: Left;   ULNAR NERVE TRANSPOSITION Bilateral 1989   Patient Active Problem List   Diagnosis Date Noted   Charcot foot due to diabetes mellitus ([place]) 11/16/2021   Drug rash 11/16/2021   Status post reverse total replacement of left shoulder 09/30/2021   Hyperuricemia 07/14/2021   Septic joint of left shoulder region ([place]) 04/29/2021   Osteomyelitis of second toe of left foot (HCC)    Penicillin allergy    Chronic osteomyelitis ([place])    Diabetic foot infection ([place])    S/p total knee replacement, bilateral    Septic arthritis of shoulder, left ([place]) 03/03/2021   Atrial fibrillation, chronic ([place]) 03/03/2021   Chronic anticoagulation 03/03/2021   DM (diabetes mellitus), type 2 with neurological complications ([place]) 93/71/6967   Essential hypertension 03/03/2021   Hypokalemia 03/03/2021   Prolonged QT interval 03/03/2021   COPD (chronic obstructive pulmonary disease) ([place]) 03/03/2021   Chronic CHF (congestive heart failure) ([place]) 03/03/2021   Diabetic peripheral neuropathy ([place]) 01/03/2019   Obesity 06/01/2016   Wound dehiscence 05/04/2016   Knee osteoarthritis 04/06/2016    REFERRING DIAG: R26.9 (ICD-10-CM) - Gait difficulty  THERAPY DIAG:  Unsteadiness on feet  Muscle weakness (generalized)  Other abnormalities of gait and mobility  Abnormal posture  Other symptoms and signs involving the musculoskeletal system  Rationale for Evaluation and Treatment Rehabilitation  PERTINENT HISTORY: old L cerebellar infarct  PRECAUTIONS: Falls  SUBJECTIVE: I'm better but still hurting  PAIN:  Are you having pain? No and Yes: NPRS scale: 2-3/10 Pain location: right hip and low back Pain description: sharp and tight Aggravating factors: moving  refrigerator Relieving factors: unsure  OBJECTIVE: (objective measurements taken at eval unless otherwise dated)  Strength     Strength Assessment Site Hip     Right/Left Hip Right;Left     Right Hip Flexion 5/5     Right Hip Extension 4/5     Right Hip ABduction 4/5     Left Hip Flexion 4+/5     Left Hip Extension 4-/5     Left Hip ABduction 4-/5          Transfers    Five time sit to stand comments  14 sec          Ambulation/Gait    Ambulation Distance (Feet) 90 Feet     Assistive device None   crow boot    Gait Pattern Step-through pattern;Antalgic   wide BOS    Ambulation Surface Level;Indoor          Standardized Balance Assessment    Standardized Balance Assessment Berg Balance Test  Berg Balance Test    Sit to Stand Able to stand without using hands and stabilize independently     Standing Unsupported Able to stand safely 2 minutes     Sitting with Back Unsupported but Feet Supported on Floor or Stool Able to sit safely and securely 2 minutes     Stand to Sit Sits safely with minimal use of hands     Transfers Able to transfer safely, minor use of hands     Standing Unsupported with Eyes Closed Able to stand 10 seconds with supervision     Standing Unsupported with Feet Together Able to place feet together independently and stand 1 minute safely     From Standing, Reach Forward with Outstretched Arm Can reach forward >5 cm safely (2")     From Standing Position, Pick up Object from Floor Able to pick up shoe safely and easily     From Standing Position, Turn to Look Behind Over each Shoulder Looks behind from both sides and weight shifts well     Turn 360 Degrees Able to turn 360 degrees safely one side only in 4 seconds or less     Standing Unsupported, Alternately Place Feet on Step/Stool Able to stand independently and complete 8 steps >20 seconds     Standing Unsupported, One Foot in Front Able to plae foot ahead of the other independently and hold 30  seconds     Standing on One Leg Tries to lift leg/unable to hold 3 seconds but remains standing independently     Total Score 47     Berg comment: 47/56          High Level Balance    High Level Balance Comments Standing on foam: EO 30 sec; EC 30 sec but unsteady with 1 minor LOB; On firm surface: EO 30 sec, EC 30 sec    Objective: 07/01/22 Left knee strength 5/5 5x sit to stand 20.5 sec     TODAY'S TREATMENT:  07/01/22 Nustep L5 x 6 min Standing:   Hip flexion 2x10 Hip abduction 2x10 Hip extension 2x10 Mini functional squat 2x10  Corner balance: EO feet together on foam: static standing x30 sec, head nods & head turns 2x30 sec each EC feet together on foam: static standing x25 sec, then x 30 sec EO rotation feet apart and together x 5 ea Semi tandem stance EO, EC multiple reps (max 15 sec with R foot in back), L x 30 sec  Alternating toe taps to 6 inch step x 6 ea no UE support , x 10 ea with one UE support, lateral taps x 10 ea no UE support    06/29/22 Nustep L5 x 6 min Stretches: Doorway stretch 2x30 sec for R QL/lumbar Forward flexion stretch 2x30sec with R and L x 5 sec ea also    Corner balance: EO feet together on foam: static standing 30 sec, head nods & head turns 2x30 sec each EC feet together on foam: static standing x20 sec   06/25/22 Seated: LAQ 2x10 Ankle DF 2x10  Standing: Hip flexion 2x10 Hip abduction 2x10 Hip extension 2x10 Mini squat 2x10    Corner balance: EO feet together on foam: static standing x30 sec, head nods & head turns 2x30 sec each EC feet together on foam: static standing x30 sec          PATIENT EDUCATION: Education details: HEP updates Person educated: Patient Education method: Explanation and Verbal cues Education comprehension: verbalized understanding  HOME EXERCISE PROGRAM: [email or web address]      PT Long Term Goals - 06/25/22 1138       PT LONG TERM GOAL #1   Title Pt will be independent with his  strength/balance HEP    Time 6    Period Weeks    Status On-going    Target Date 07/15/22      PT LONG TERM GOAL #2   Title Pt will have improved 5x STS to </=13 sec to demo low fall risk    Time 6    Period Weeks    Status On-going    Target Date 07/15/22      PT LONG TERM GOAL #3   Title Pt will have improved Berg Balance Score to at least 52/56 to demo low fall risk    Baseline 47/56    Time 6    Period Weeks    Status On-going    Target Date 07/15/22      PT LONG TERM GOAL #4   Title Pt will demo at least 5/5 bilat glute strength    Time 6    Period Weeks    Status On-going    Target Date 07/15/22              Assessment/Plan -       Clinical Impression Statement Bill reports less back pain today. He improved with balance activities vs. Last visit. Toe taps still difficult without UE support. Sit to stand increased to 20 sec today, likely due to back pain.    Personal Factors and Comorbidities Fitness;Time since onset of injury/illness/exacerbation     Examination-Activity Limitations Locomotion Level;Transfers;Stand     Stability/Clinical Decision Making Evolving/Moderate complexity     Clinical Decision Making Moderate     Rehab Potential Good     PT Frequency 2x / week     PT Duration 6 weeks     PT Treatment/Interventions ADLs/Self Care Home Management;Aquatic Therapy;Cryotherapy;Electrical Stimulation;Iontophoresis '4mg'$ /ml Dexamethasone;Moist Heat;Ultrasound;Gait training;Stair training;Traction;Functional mobility training;Therapeutic activities;Therapeutic exercise;Neuromuscular re-education;Patient/family education;Manual techniques;Dry needling;Taping;Balance training     PT Next Visit Plan Initiate bilat hip strengthening and balance exercises (focus on narrow BOS and vestibular)     PT Home Exercise Plan [id number] (for shoulder).     Consulted and Agree with Plan of Care Patient       Levern Kalka, PT,  07/01/2022, 12:31 PM

## 2022-06-30 NOTE — Patient Outreach (Signed)
  Care Coordination  Initial Visit Note   06/30/2022 Name: Willie Pineda MRN: [id number] DOB: 1946/07/28  Willie Pineda is a 76 y.o. year old male who sees Willie Huddle, MD for primary care. I spoke with  Willie Pineda by phone today  What matters to the patients health and wellness today?   : Patient reports no concerns or needs with health and wellness related to physical or mental heath. .   Recommendation: Patient may benefit from, and is in agreement to Continue with physical therapy to address his balance issues   Goals Addressed   None    SDOH assessments and interventions completed:   Yes SDOH Interventions Today    Flowsheet Row Most Recent Value  SDOH Interventions   Food Insecurity Interventions Intervention Not Indicated  Housing Interventions Intervention Not Indicated  Stress Interventions Intervention Not Indicated  Transportation Interventions Intervention Not Indicated      Care Coordination Interventions Activated:  No Care Coordination Interventions:  No, not indicated  Follow up plan: No further intervention required.  Encounter Outcome:  Pt. Visit Completed  Willie Pineda, Willie Pineda [phone]

## 2022-06-30 NOTE — Patient Instructions (Signed)
Visit Information  Thank you for taking time to visit with me today. Please don't hesitate to contact me if I can be of assistance to you. During our call today no needs were identified   Goals Addressed   None    No follow up scheduled, per our conversation you do not desire continued follow up  Casimer Lanius, [place] [phone]   Please call the care guide team at [phone] if you need to schedule an appointment with me.   If you are experiencing a Mental Health or [place] or need someone to talk to, please call [phone]-TALK (toll free, 24 hour hotline)   Patient verbalizes understanding of instructions and care plan provided today and agrees to view in [facility]. Active MyChart status and patient understanding of how to access instructions and care plan via MyChart confirmed with patient.

## 2022-07-01 ENCOUNTER — Ambulatory Visit: Payer: Medicare Other | Admitting: Physical Therapy

## 2022-07-01 ENCOUNTER — Encounter: Payer: Self-pay | Admitting: Physical Therapy

## 2022-07-01 DIAGNOSIS — M25512 Pain in left shoulder: Secondary | ICD-10-CM | POA: Diagnosis not present

## 2022-07-01 DIAGNOSIS — R29898 Other symptoms and signs involving the musculoskeletal system: Secondary | ICD-10-CM

## 2022-07-01 DIAGNOSIS — R1310 Dysphagia, unspecified: Secondary | ICD-10-CM | POA: Diagnosis not present

## 2022-07-01 DIAGNOSIS — G8929 Other chronic pain: Secondary | ICD-10-CM | POA: Diagnosis not present

## 2022-07-01 DIAGNOSIS — R2681 Unsteadiness on feet: Secondary | ICD-10-CM

## 2022-07-01 DIAGNOSIS — M6281 Muscle weakness (generalized): Secondary | ICD-10-CM

## 2022-07-01 DIAGNOSIS — R293 Abnormal posture: Secondary | ICD-10-CM

## 2022-07-01 DIAGNOSIS — R2689 Other abnormalities of gait and mobility: Secondary | ICD-10-CM | POA: Diagnosis not present

## 2022-07-01 DIAGNOSIS — M539 Dorsopathy, unspecified: Secondary | ICD-10-CM | POA: Diagnosis not present

## 2022-07-06 ENCOUNTER — Encounter: Payer: Self-pay | Admitting: Physical Therapy

## 2022-07-06 ENCOUNTER — Ambulatory Visit: Payer: Medicare Other | Attending: Orthopaedic Surgery | Admitting: Physical Therapy

## 2022-07-06 DIAGNOSIS — R2681 Unsteadiness on feet: Secondary | ICD-10-CM | POA: Diagnosis not present

## 2022-07-06 DIAGNOSIS — M6281 Muscle weakness (generalized): Secondary | ICD-10-CM | POA: Insufficient documentation

## 2022-07-06 DIAGNOSIS — R2689 Other abnormalities of gait and mobility: Secondary | ICD-10-CM | POA: Diagnosis not present

## 2022-07-06 DIAGNOSIS — R293 Abnormal posture: Secondary | ICD-10-CM | POA: Diagnosis not present

## 2022-07-06 DIAGNOSIS — R29898 Other symptoms and signs involving the musculoskeletal system: Secondary | ICD-10-CM | POA: Insufficient documentation

## 2022-07-06 NOTE — Therapy (Signed)
OUTPATIENT PHYSICAL THERAPY TREATMENT NOTE   Patient Name: Willie Pineda MRN: [id number] DOB:05-22-1946, 76 y.o., male Today's Date: 07/06/2022  PCP: Josetta Huddle REFERRING PROVIDER: Armond Hang   PT End of Session - 07/06/22 1357     Visit Number 8    Number of Visits 12    Date for PT Re-Evaluation 07/15/22    Authorization Type UHC Medicare    Authorization - Visit Number 8    Progress Note Due on Visit 10    PT Start Time 1315    PT Stop Time 1357    PT Time Calculation (min) 42 min    Activity Tolerance Patient tolerated treatment well    Behavior During Therapy WFL for tasks assessed/performed                Past Medical History:  Diagnosis Date   AICD (automatic cardioverter/defibrillator) present    Arthritis    "hands primarily" (05/05/2016)   Atrial fibrillation ([place])    Back pain    age related   CHF (congestive heart failure) ([place])    no problemes since 1062   Complication of anesthesia    Bronchial spasms   COPD (chronic obstructive pulmonary disease) ([place])    inhalers as needed   Coronary artery disease    Depression    takes Zoloft daily   Diabetic peripheral neuropathy ([place]) 01/03/2019   Drug rash 11/16/2021   Dysrhythmia    A Fib-takes Xarelto daily   Enlarged prostate    benign   GERD (gastroesophageal reflux disease)    takes Omeprazole daily   Gout    History of blood clots    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)   Hyperlipidemia    takes Atorvastatin daily   Hypertension    takes Metoprolol,HCTZ,and Losartan daily.Takes Clonidine if needed   Hyperuricemia 07/14/2021   Nocturia    Peripheral neuropathy    takes Gabapentin daily   Peripheral vascular disease (HCC)    legs blockage    Pneumonia ?2015   Presence of permanent cardiac pacemaker    Restless leg    Sleep apnea    uses Bipap   Stroke [facility]) 2012   denies residual on 05/05/2016   Past Surgical History:  Procedure Laterality Date   AMPUTATION Left  04/03/2021   Procedure: LEFT 2ND RAY AMPUTATION;  Surgeon: Newt Minion, MD;  Location: [place];  Service: Orthopedics;  Laterality: Left;   CARPAL TUNNEL RELEASE     CATARACT EXTRACTION W/ INTRAOCULAR LENS  IMPLANT, BILATERAL Bilateral    COLONOSCOPY WITH PROPOFOL N/A 03/09/2017   Procedure: COLONOSCOPY WITH PROPOFOL;  Surgeon: Wonda Horner, MD;  Location: [facility] ENDOSCOPY;  Service: Endoscopy;  Laterality: N/A;   COLONOSCOPY WITH PROPOFOL N/A 04/03/2020   Procedure: COLONOSCOPY WITH PROPOFOL;  Surgeon: Wonda Horner, MD;  [facility] ENDOS[facility];  Service: Endoscopy;  Laterality: N/A;   I & D KNEE WITH POLY EXCHANGE Right 05/04/2016   IRRIGATION AND DEBRIDEMENT Right KNEE WITH UNICOMPARTMENTAL POLY EXCHANGE   I & D KNEE WITH POLY EXCHANGE Right 05/04/2016   Procedure: IRRIGATION AND DEBRIDEMENT Right KNEE WITH UNICOMPARTMENTAL POLY EXCHANGE;  Surgeon: Renette Butters, MD;  Location: [place];  Service: Orthopedics;  Laterality: Right;   JOINT REPLACEMENT     LAPAROSCOPIC CHOLECYSTECTOMY  1995   PARTIAL KNEE ARTHROPLASTY Right 04/06/2016   Procedure: UNICOMPARTMENTAL RIGHT KNEE;  Surgeon: Renette Butters, MD;  Location: [place];  Service: Orthopedics;  Laterality:  Right;   POLYPECTOMY  04/03/2020   Procedure: POLYPECTOMY;  Surgeon: Wonda Horner, MD;  [facility];  Service: Endoscopy;;   REVERSE SHOULDER ARTHROPLASTY Left 04/29/2021   Procedure: Revision antibiotic spacer placement. Incision and drainage.;  Surgeon: [name], MD;  [facility];  Service: Orthopedics;  Laterality: Left;   REVERSE SHOULDER ARTHROPLASTY Left 09/30/2021   Procedure: REVERSE SHOULDER ARTHROPLASTY;  Surgeon: [name], MD;  [facility];  Service: Orthopedics;  Laterality: Left;   STRABISMUS SURGERY Bilateral 06/10/2017   Procedure: REPAIR STRABISMUS BILATERAL;  Surgeon: Everitt Amber, MD;  Location: [place];  Service: Ophthalmology;  Laterality: Bilateral;   TONSILLECTOMY  AND ADENOIDECTOMY     TOTAL KNEE ARTHROPLASTY Left 2008   TOTAL SHOULDER ARTHROPLASTY Left 03/04/2021   Procedure: Total Shoulder Arthroplasty;  Surgeon: [name], MD;  [facility];  Service: Orthopedics;  Laterality: Left;   ULNAR NERVE TRANSPOSITION Bilateral 1989   Patient Active Problem List   Diagnosis Date Noted   Charcot foot due to diabetes mellitus ([place]) 11/16/2021   Drug rash 11/16/2021   Status post reverse total replacement of left shoulder 09/30/2021   Hyperuricemia 07/14/2021   Septic joint of left shoulder region ([place]) 04/29/2021   Osteomyelitis of second toe of left foot (HCC)    Penicillin allergy    Chronic osteomyelitis ([place])    Diabetic foot infection ([place])    S/p total knee replacement, bilateral    Septic arthritis of shoulder, left ([place]) 03/03/2021   Atrial fibrillation, chronic ([place]) 03/03/2021   Chronic anticoagulation 03/03/2021   DM (diabetes mellitus), type 2 with neurological complications ([place]) 30/86/5784   Essential hypertension 03/03/2021   Hypokalemia 03/03/2021   Prolonged QT interval 03/03/2021   COPD (chronic obstructive pulmonary disease) ([place]) 03/03/2021   Chronic CHF (congestive heart failure) ([place]) 03/03/2021   Diabetic peripheral neuropathy ([place]) 01/03/2019   Obesity 06/01/2016   Wound dehiscence 05/04/2016   Knee osteoarthritis 04/06/2016    REFERRING DIAG: R26.9 (ICD-10-CM) - Gait difficulty  THERAPY DIAG:  Unsteadiness on feet  Muscle weakness (generalized)  Other abnormalities of gait and mobility  Rationale for Evaluation and Treatment Rehabilitation  PERTINENT HISTORY: old L cerebellar infarct  PRECAUTIONS: Falls  SUBJECTIVE: I'm better but still hurting  PAIN:  Are you having pain? Yes: NPRS scale: 2-3/10 Pain location: low back Pain description: sore Aggravating factors: moving refrigerator Relieving factors: unsure  OBJECTIVE: (objective measurements taken at eval unless otherwise dated)  Strength      Strength Assessment Site Hip     Right/Left Hip Right;Left     Right Hip Flexion 5/5     Right Hip Extension 4/5     Right Hip ABduction 4/5     Left Hip Flexion 4+/5     Left Hip Extension 4-/5     Left Hip ABduction 4-/5          Transfers    Five time sit to stand comments  14 sec          Ambulation/Gait    Ambulation Distance (Feet) 90 Feet     Assistive device None   crow boot    Gait Pattern Step-through pattern;Antalgic   wide BOS    Ambulation Surface Level;Indoor          Standardized Balance Assessment    Standardized Balance Assessment Berg Balance Test          Berg Balance Test    Sit to Stand  Able to stand without using hands and stabilize independently     Standing Unsupported Able to stand safely 2 minutes     Sitting with Back Unsupported but Feet Supported on Floor or Stool Able to sit safely and securely 2 minutes     Stand to Sit Sits safely with minimal use of hands     Transfers Able to transfer safely, minor use of hands     Standing Unsupported with Eyes Closed Able to stand 10 seconds with supervision     Standing Unsupported with Feet Together Able to place feet together independently and stand 1 minute safely     From Standing, Reach Forward with Outstretched Arm Can reach forward >5 cm safely (2")     From Standing Position, Pick up Object from Floor Able to pick up shoe safely and easily     From Standing Position, Turn to Look Behind Over each Shoulder Looks behind from both sides and weight shifts well     Turn 360 Degrees Able to turn 360 degrees safely one side only in 4 seconds or less     Standing Unsupported, Alternately Place Feet on Step/Stool Able to stand independently and complete 8 steps >20 seconds     Standing Unsupported, One Foot in Front Able to plae foot ahead of the other independently and hold 30 seconds     Standing on One Leg Tries to lift leg/unable to hold 3 seconds but remains standing independently     Total Score 47      Berg comment: 47/56          High Level Balance    High Level Balance Comments Standing on foam: EO 30 sec; EC 30 sec but unsteady with 1 minor LOB; On firm surface: EO 30 sec, EC 30 sec    Objective: 07/01/22 Left knee strength 5/5 5x sit to stand 20.5 sec     TODAY'S TREATMENT:  07/06/22 Nustep L5 x 5 min  Standing: Hip abd 2 x 10 Hip extension 2 x 10 Mini squat 2 x 10 Hip flex 2 x 10 5x STS Without UE support  Balance: alternating tap ups 2 x 10 intermittent UE support On foam: EO feet together head nods and turns 2 x 30 sec each      EC static stance 2 x 30 sec      Standing feet together trunk rotations 2 x 10 intermittent UE support Semi tandem 2 x 30 sec Side step and backward gait with CGA for backward, focus on fully lifting feet to reduce fall risk Coordination box stepping initially with UE support then without UE support with CGA    07/01/22 Nustep L5 x 6 min Standing:   Hip flexion 2x10 Hip abduction 2x10 Hip extension 2x10 Mini functional squat 2x10  Corner balance: EO feet together on foam: static standing x30 sec, head nods & head turns 2x30 sec each EC feet together on foam: static standing x25 sec, then x 30 sec EO rotation feet apart and together x 5 ea Semi tandem stance EO, EC multiple reps (max 15 sec with R foot in back), L x 30 sec  Alternating toe taps to 6 inch step x 6 ea no UE support , x 10 ea with one UE support, lateral taps x 10 ea no UE support       PATIENT EDUCATION: Education details: HEP updates Person educated: Patient Education method: Explanation and Verbal cues Education comprehension: verbalized understanding   HOME  EXERCISE PROGRAM: [email or web address]      PT Long Term Goals - 06/25/22 1138       PT LONG TERM GOAL #1   Title Pt will be independent with his strength/balance HEP    Time 6    Period Weeks    Status On-going    Target Date 07/15/22      PT LONG TERM GOAL #2   Title Pt will have improved 5x STS to  </=13 sec to demo low fall risk    Time 6    Period Weeks    Status On-going    Target Date 07/15/22      PT LONG TERM GOAL #3   Title Pt will have improved Berg Balance Score to at least 52/56 to demo low fall risk    Baseline 47/56    Time 6    Period Weeks    Status On-going    Target Date 07/15/22      PT LONG TERM GOAL #4   Title Pt will demo at least 5/5 bilat glute strength    Time 6    Period Weeks    Status On-going    Target Date 07/15/22              Assessment/Plan -       Clinical Impression Statement Pt continues to progress well with balance and strength. He is working towards [employer] and Comorbidities Fitness;Time since onset of injury/illness/exacerbation     Examination-Activity Limitations Locomotion Level;Transfers;Stand     Stability/Clinical Decision Making Evolving/Moderate complexity     Clinical Decision Making Moderate     Rehab Potential Good     PT Frequency 2x / week     PT Duration 6 weeks     PT Treatment/Interventions ADLs/Self Care Home Management;Aquatic Therapy;Cryotherapy;Electrical Stimulation;Iontophoresis '4mg'$ /ml Dexamethasone;Moist Heat;Ultrasound;Gait training;Stair training;Traction;Functional mobility training;Therapeutic activities;Therapeutic exercise;Neuromuscular re-education;Patient/family education;Manual techniques;Dry needling;Taping;Balance training     PT Next Visit Plan D/c? Recheck BERG    PT Home Exercise Plan [email or web address] (for shoulder).     Consulted and Agree with Plan of Care Patient       Sueanne Maniaci, PT,  07/06/2022, 1:58 PM

## 2022-07-08 ENCOUNTER — Ambulatory Visit: Payer: Medicare Other | Admitting: Physical Therapy

## 2022-07-09 ENCOUNTER — Ambulatory Visit: Payer: Medicare Other | Admitting: Physical Therapy

## 2022-07-09 ENCOUNTER — Encounter: Payer: Self-pay | Admitting: Physical Therapy

## 2022-07-09 DIAGNOSIS — R29898 Other symptoms and signs involving the musculoskeletal system: Secondary | ICD-10-CM | POA: Diagnosis not present

## 2022-07-09 DIAGNOSIS — R2681 Unsteadiness on feet: Secondary | ICD-10-CM

## 2022-07-09 DIAGNOSIS — R2689 Other abnormalities of gait and mobility: Secondary | ICD-10-CM

## 2022-07-09 DIAGNOSIS — M6281 Muscle weakness (generalized): Secondary | ICD-10-CM

## 2022-07-09 DIAGNOSIS — R293 Abnormal posture: Secondary | ICD-10-CM

## 2022-07-09 NOTE — Therapy (Signed)
OUTPATIENT PHYSICAL THERAPY TREATMENT NOTE   Patient Name: Willie Pineda MRN: [id number] DOB:05-01-1946, 76 y.o., male Today's Date: 07/09/2022  PCP: Shelton Silvas PROVIDER: Armond Hang   PT End of Session - 07/09/22 0850     Visit Number 9    Number of Visits 12    Date for PT Re-Evaluation 07/15/22    Authorization Type UHC Medicare    Authorization - Visit Number 9    Progress Note Due on Visit 10    PT Start Time 0850    PT Stop Time 0930    PT Time Calculation (min) 40 min    Activity Tolerance Patient tolerated treatment well    Behavior During Therapy [facility] for tasks assessed/performed                Past Medical History:  Diagnosis Date   AICD (automatic cardioverter/defibrillator) present    Arthritis    "hands primarily" (05/05/2016)   Atrial fibrillation ([place])    Back pain    age related   CHF (congestive heart failure) ([place])    no problemes since 1751   Complication of anesthesia    Bronchial spasms   COPD (chronic obstructive pulmonary disease) ([place])    inhalers as needed   Coronary artery disease    Depression    takes Zoloft daily   Diabetic peripheral neuropathy ([place]) 01/03/2019   Drug rash 11/16/2021   Dysrhythmia    A Fib-takes Xarelto daily   Enlarged prostate    benign   GERD (gastroesophageal reflux disease)    takes Omeprazole daily   Gout    History of blood clots    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)   Hyperlipidemia    takes Atorvastatin daily   Hypertension    takes Metoprolol,HCTZ,and Losartan daily.Takes Clonidine if needed   Hyperuricemia 07/14/2021   Nocturia    Peripheral neuropathy    takes Gabapentin daily   Peripheral vascular disease (HCC)    legs blockage    Pneumonia ?2015   Presence of permanent cardiac pacemaker    Restless leg    Sleep apnea    uses Bipap   Stroke [facility]) 2012   denies residual on 05/05/2016   Past Surgical History:  Procedure Laterality Date   AMPUTATION Left  04/03/2021   Procedure: LEFT 2ND RAY AMPUTATION;  Surgeon: Newt Minion, MD;  Location: [place];  Service: Orthopedics;  Laterality: Left;   CARPAL TUNNEL RELEASE     CATARACT EXTRACTION W/ INTRAOCULAR LENS  IMPLANT, BILATERAL Bilateral    COLONOSCOPY WITH PROPOFOL N/A 03/09/2017   Procedure: COLONOSCOPY WITH PROPOFOL;  Surgeon: Wonda Horner, MD;  [facility]COPY;  Service: Endoscopy;  Laterality: N/A;   COLONOSCOPY WITH PROPOFOL N/A 04/03/2020   Procedure: COLONOSCOPY WITH PROPOFOL;  Surgeon: Wonda Horner, MD;  [facility];  Service: Endoscopy;  Laterality: N/A;   I & D KNEE WITH POLY EXCHANGE Right 05/04/2016   IRRIGATION AND DEBRIDEMENT Right KNEE WITH UNICOMPARTMENTAL POLY EXCHANGE   I & D KNEE WITH POLY EXCHANGE Right 05/04/2016   Procedure: IRRIGATION AND DEBRIDEMENT Right KNEE WITH UNICOMPARTMENTAL POLY EXCHANGE;  Surgeon: Renette Butters, MD;  Location: [place];  Service: Orthopedics;  Laterality: Right;   JOINT REPLACEMENT     LAPAROSCOPIC CHOLECYSTECTOMY  1995   PARTIAL KNEE ARTHROPLASTY Right 04/06/2016   Procedure: UNICOMPARTMENTAL RIGHT KNEE;  Surgeon: Renette Butters, MD;  Location: [place];  Service: Orthopedics;  Laterality:  Right;   POLYPECTOMY  04/03/2020   Procedure: POLYPECTOMY;  Surgeon: Graylin Shiver, MD;  [facility];  Service: Endoscopy;;   REVERSE SHOULDER ARTHROPLASTY Left 04/29/2021   Procedure: Revision antibiotic spacer placement. Incision and drainage.;  Surgeon: Bjorn Pippin, MD;  [facility];  Service: Orthopedics;  Laterality: Left;   REVERSE SHOULDER ARTHROPLASTY Left 09/30/2021   Procedure: REVERSE SHOULDER ARTHROPLASTY;  Surgeon: Bjorn Pippin, MD;  [facility];  Service: Orthopedics;  Laterality: Left;   STRABISMUS SURGERY Bilateral 06/10/2017   Procedure: REPAIR STRABISMUS BILATERAL;  Surgeon: Verne Carrow, MD;  Location: [facility];  Service: Ophthalmology;  Laterality: Bilateral;   TONSILLECTOMY  AND ADENOIDECTOMY     TOTAL KNEE ARTHROPLASTY Left 2008   TOTAL SHOULDER ARTHROPLASTY Left 03/04/2021   Procedure: Total Shoulder Arthroplasty;  Surgeon: Bjorn Pippin, MD;  [facility];  Service: Orthopedics;  Laterality: Left;   ULNAR NERVE TRANSPOSITION Bilateral 1989   Patient Active Problem List   Diagnosis Date Noted   Charcot foot due to diabetes mellitus (HCC) 11/16/2021   Drug rash 11/16/2021   Status post reverse total replacement of left shoulder 09/30/2021   Hyperuricemia 07/14/2021   Septic joint of left shoulder region (HCC) 04/29/2021   Osteomyelitis of second toe of left foot (HCC)    Penicillin allergy    Chronic osteomyelitis (HCC)    Diabetic foot infection (HCC)    S/p total knee replacement, bilateral    Septic arthritis of shoulder, left (HCC) 03/03/2021   Atrial fibrillation, chronic (HCC) 03/03/2021   Chronic anticoagulation 03/03/2021   DM (diabetes mellitus), type 2 with neurological complications (HCC) 03/03/2021   Essential hypertension 03/03/2021   Hypokalemia 03/03/2021   Prolonged QT interval 03/03/2021   COPD (chronic obstructive pulmonary disease) (HCC) 03/03/2021   Chronic CHF (congestive heart failure) (HCC) 03/03/2021   Diabetic peripheral neuropathy (HCC) 01/03/2019   Obesity 06/01/2016   Wound dehiscence 05/04/2016   Knee osteoarthritis 04/06/2016    REFERRING DIAG: R26.9 (ICD-10-CM) - Gait difficulty  THERAPY DIAG:  Unsteadiness on feet  Muscle weakness (generalized)  Other abnormalities of gait and mobility  Abnormal posture  Other symptoms and signs involving the musculoskeletal system  Rationale for Evaluation and Treatment Rehabilitation  PERTINENT HISTORY: old L cerebellar infarct  PRECAUTIONS: Falls  SUBJECTIVE: Pt states he's been doing well. Had a few good days with his balance. Hopes to see doctor to get modifications in his boot.   PAIN:  Are you having pain? Yes: NPRS scale: 2-3/10 Pain location: low  back Pain description: sore Aggravating factors: moving refrigerator Relieving factors: unsure  OBJECTIVE: (objective measurements taken at eval unless otherwise dated)  07/01/22 Left knee strength 5/5 5x sit to stand 20.5 sec    [facility] PT Assessment - 07/09/22 0001       Assessment   Medical Diagnosis R26.9 (ICD-10-CM) - Gait difficulty    Referring Provider (PT) Netta Cedars, MD    Onset Date/Surgical Date 02/22/22      Transfers   Five time sit to stand comments  11 sec      Berg Balance Test   Sit to Stand Able to stand without using hands and stabilize independently    Standing Unsupported Able to stand safely 2 minutes    Sitting with Back Unsupported but Feet Supported on Floor or Stool Able to sit safely and securely 2 minutes    Stand to Sit Sits safely with minimal use of hands  Transfers Able to transfer safely, minor use of hands    Standing Unsupported with Eyes Closed Able to stand 10 seconds safely    Standing Unsupported with Feet Together Able to place feet together independently and stand 1 minute safely    From Standing, Reach Forward with Outstretched Arm Can reach confidently >25 cm (10")    From Standing Position, Pick up Object from [place] to pick up shoe safely and easily    From Standing Position, Turn to Look Behind Over each Shoulder Looks behind from both sides and weight shifts well    Turn 360 Degrees Able to turn 360 degrees safely one side only in 4 seconds or less    Standing Unsupported, Alternately Place Feet on Step/Stool Able to stand independently and safely and complete 8 steps in 20 seconds    Standing Unsupported, One Foot in Front Able to plae foot ahead of the other independently and hold 30 seconds    Standing on One Leg Tries to lift leg/unable to hold 3 seconds but remains standing independently    Total Score 51    Berg comment: 51/56                TODAY'S TREATMENT:  07/09/22 Nustep L5 x 5 min  Standing: Hip  abd 2x10 red tband Hip ext 2x10 red tband   Marching 2x10 red tband Mini squat 2x10  Balance: SLS with one hand support 2x30 sec On foam:  Feet apart trunk rotations 2x10   Feet together trunk rotations 2x10 intermittent UE support  Feet together EC static balance 2x30 sec  Feet together EC head nods & then head turns 2x30 sec    07/06/22 Nustep L5 x 5 min  Standing: Hip abd 2 x 10 Hip extension 2 x 10 Mini squat 2 x 10 Hip flex 2 x 10 5x STS Without UE support  Balance: alternating tap ups 2 x 10 intermittent UE support On foam: EO feet together head nods and turns 2 x 30 sec each      EC static stance 2 x 30 sec      Standing feet together trunk rotations 2 x 10 intermittent UE support Semi tandem 2 x 30 sec Side step and backward gait with CGA for backward, focus on fully lifting feet to reduce fall risk Coordination box stepping initially with UE support then without UE support with CGA      PATIENT EDUCATION: Education details: HEP updates Person educated: Patient Education method: Explanation and Verbal cues Education comprehension: verbalized understanding   HOME EXERCISE PROGRAM: [email or web address]      PT Long Term Goals - 06/25/22 1138       PT LONG TERM GOAL #1   Title Pt will be independent with his strength/balance HEP    Time 6    Period Weeks    Status On-going    Target Date 07/15/22      PT LONG TERM GOAL #2   Title Pt will have improved 5x STS to </=13 sec to demo low fall risk    Time 6    Period Weeks    Status Met -- 11 sec on 07/09/22    Target Date 07/15/22      PT LONG TERM GOAL #3   Title Pt will have improved Berg Balance Score to at least 52/56 to demo low fall risk    Baseline 51/56 on 07/09/22   Time 6    Period Weeks  Status Partially met   Target Date 07/15/22      PT LONG TERM GOAL #4   Title Pt will demo at least 5/5 bilat glute strength    Time 6    Period Weeks    Status On going   Target Date 07/15/22               Assessment/Plan -       Clinical Impression Statement Rechecked pt's goals -- pt is close to meeting Berg balance score goal. Hip strength is still not fully at 5/5. Discussed doing one more visit to finalize HEP for d/c.     Personal Factors and Comorbidities Fitness;Time since onset of injury/illness/exacerbation     Examination-Activity Limitations Locomotion Level;Transfers;Stand     Stability/Clinical Decision Making Evolving/Moderate complexity     Clinical Decision Making Moderate     Rehab Potential Good     PT Frequency 2x / week     PT Duration 6 weeks     PT Treatment/Interventions ADLs/Self Care Home Management;Aquatic Therapy;Cryotherapy;Electrical Stimulation;Iontophoresis 4mg /ml Dexamethasone;Moist Heat;Ultrasound;Gait training;Stair training;Traction;Functional mobility training;Therapeutic activities;Therapeutic exercise;Neuromuscular re-education;Patient/family education;Manual techniques;Dry needling;Taping;Balance training     PT Next Visit Plan Focus more on hip strengthening and then finalizing HEP for d/c.         Consulted and Agree with Plan of Care Patient       [place]oll[place], PT, DPT 07/09/2022, 9:09 AM

## 2022-07-12 DIAGNOSIS — I10 Essential (primary) hypertension: Secondary | ICD-10-CM | POA: Diagnosis not present

## 2022-07-12 DIAGNOSIS — I428 Other cardiomyopathies: Secondary | ICD-10-CM | POA: Diagnosis not present

## 2022-07-13 ENCOUNTER — Ambulatory Visit: Payer: Medicare Other | Admitting: Physical Therapy

## 2022-07-13 ENCOUNTER — Encounter: Payer: Self-pay | Admitting: Physical Therapy

## 2022-07-13 DIAGNOSIS — R2689 Other abnormalities of gait and mobility: Secondary | ICD-10-CM

## 2022-07-13 DIAGNOSIS — R2681 Unsteadiness on feet: Secondary | ICD-10-CM

## 2022-07-13 DIAGNOSIS — M6281 Muscle weakness (generalized): Secondary | ICD-10-CM

## 2022-07-13 NOTE — Therapy (Signed)
OUTPATIENT PHYSICAL THERAPY TREATMENT NOTE 10th visit and DISCHARGE   Patient Name: Willie Pineda MRN: [id number] DOB:1946-02-22, 76 y.o., male Today's Date: 07/13/2022  PCP: Marden Noble REFERRING PROVIDER: Netta Cedars Dates of service 06/03/22-07/13/22  PT End of Session - 07/13/22 1351     Visit Number 10    Date for PT Re-Evaluation 07/15/22    Authorization Type UHC Medicare    Authorization - Visit Number 10    Progress Note Due on Visit 10    PT Start Time 1315    PT Stop Time 1353    PT Time Calculation (min) 38 min    Activity Tolerance Patient tolerated treatment well    Behavior During Therapy WFL for tasks assessed/performed                 Past Medical History:  Diagnosis Date   AICD (automatic cardioverter/defibrillator) present    Arthritis    "hands primarily" (05/05/2016)   Atrial fibrillation (HCC)    Back pain    age related   CHF (congestive heart failure) (HCC)    no problemes since 2018   Complication of anesthesia    Bronchial spasms   COPD (chronic obstructive pulmonary disease) (HCC)    inhalers as needed   Coronary artery disease    Depression    takes Zoloft daily   Diabetic peripheral neuropathy (HCC) 01/03/2019   Drug rash 11/16/2021   Dysrhythmia    A Fib-takes Xarelto daily   Enlarged prostate    benign   GERD (gastroesophageal reflux disease)    takes Omeprazole daily   Gout    History of blood clots    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)   Hyperlipidemia    takes Atorvastatin daily   Hypertension    takes Metoprolol,HCTZ,and Losartan daily.Takes Clonidine if needed   Hyperuricemia 07/14/2021   Nocturia    Peripheral neuropathy    takes Gabapentin daily   Peripheral vascular disease (HCC)    legs blockage    Pneumonia ?2015   Presence of permanent cardiac pacemaker    Restless leg    Sleep apnea    uses Bipap   Stroke [facility]) 2012   denies residual on 05/05/2016   Past Surgical History:   Procedure Laterality Date   AMPUTATION Left 04/03/2021   Procedure: LEFT 2ND RAY AMPUTATION;  Surgeon: Nadara Mustard, MD;  Location: [facility] OR;  Service: Orthopedics;  Laterality: Left;   CARPAL TUNNEL RELEASE     CATARACT EXTRACTION W/ INTRAOCULAR LENS  IMPLANT, BILATERAL Bilateral    COLONOSCOPY WITH PROPOFOL N/A 03/09/2017   Procedure: COLONOSCOPY WITH PROPOFOL;  Surgeon: Graylin Shiver, MD;  Location: [facility] ENDOSCOPY;  Service: Endoscopy;  Laterality: N/A;   COLONOSCOPY WITH PROPOFOL N/A 04/03/2020   Procedure: COLONOSCOPY WITH PROPOFOL;  Surgeon: Graylin Shiver, MD;  Location: [facility] ENDOSCOPY;  Service: Endoscopy;  Laterality: N/A;   I & D KNEE WITH POLY EXCHANGE Right 05/04/2016   IRRIGATION AND DEBRIDEMENT Right KNEE WITH UNICOMPARTMENTAL POLY EXCHANGE   I & D KNEE WITH POLY EXCHANGE Right 05/04/2016   Procedure: IRRIGATION AND DEBRIDEMENT Right KNEE WITH UNICOMPARTMENTAL POLY EXCHANGE;  Surgeon: Sheral Apley, MD;  Location: [facility] OR;  Service: Orthopedics;  Laterality: Right;   JOINT REPLACEMENT     LAPAROSCOPIC CHOLECYSTECTOMY  1995   PARTIAL KNEE ARTHROPLASTY Right 04/06/2016   Procedure: UNICOMPARTMENTAL RIGHT KNEE;  Surgeon: Sheral Apley, MD;  Location: [facility] OR;  Service: Orthopedics;  Laterality: Right;   POLYPECTOMY  04/03/2020   Procedure: POLYPECTOMY;  Surgeon: Wonda Horner, MD;  Location: [facility] ENDOSCOPY;  Service: Endoscopy;;   REVERSE SHOULDER ARTHROPLASTY Left 04/29/2021   Procedure: Revision antibiotic spacer placement. Incision and drainage.;  Surgeon: [name], MD;  Location: [facility] ORS;  Service: Orthopedics;  Laterality: Left;   REVERSE SHOULDER ARTHROPLASTY Left 09/30/2021   Procedure: REVERSE SHOULDER ARTHROPLASTY;  Surgeon: [name], MD;  Location: [facility] ORS;  Service: Orthopedics;  Laterality: Left;   STRABISMUS SURGERY Bilateral 06/10/2017   Procedure: REPAIR STRABISMUS BILATERAL;  Surgeon: Everitt Amber, MD;  Location: [place];  Service:  Ophthalmology;  Laterality: Bilateral;   TONSILLECTOMY AND ADENOIDECTOMY     TOTAL KNEE ARTHROPLASTY Left 2008   TOTAL SHOULDER ARTHROPLASTY Left 03/04/2021   Procedure: Total Shoulder Arthroplasty;  Surgeon: [name], MD;  Location: [facility] ORS;  Service: Orthopedics;  Laterality: Left;   ULNAR NERVE TRANSPOSITION Bilateral 1989   Patient Active Problem List   Diagnosis Date Noted   Charcot foot due to diabetes mellitus ([place]) 11/16/2021   Drug rash 11/16/2021   Status post reverse total replacement of left shoulder 09/30/2021   Hyperuricemia 07/14/2021   Septic joint of left shoulder region ([place]) 04/29/2021   Osteomyelitis of second toe of left foot (HCC)    Penicillin allergy    Chronic osteomyelitis ([place])    Diabetic foot infection ([place])    S/p total knee replacement, bilateral    Septic arthritis of shoulder, left ([place]) 03/03/2021   Atrial fibrillation, chronic ([place]) 03/03/2021   Chronic anticoagulation 03/03/2021   DM (diabetes mellitus), type 2 with neurological complications ([place]) 50/93/2671   Essential hypertension 03/03/2021   Hypokalemia 03/03/2021   Prolonged QT interval 03/03/2021   COPD (chronic obstructive pulmonary disease) ([place]) 03/03/2021   Chronic CHF (congestive heart failure) ([place]) 03/03/2021   Diabetic peripheral neuropathy ([place]) 01/03/2019   Obesity 06/01/2016   Wound dehiscence 05/04/2016   Knee osteoarthritis 04/06/2016    REFERRING DIAG: R26.9 (ICD-10-CM) - Gait difficulty  THERAPY DIAG:  Unsteadiness on feet  Muscle weakness (generalized)  Other abnormalities of gait and mobility  Rationale for Evaluation and Treatment Rehabilitation  PERTINENT HISTORY: old L cerebellar infarct  PRECAUTIONS: Falls  SUBJECTIVE: Pt states he continues to do well. Feels he is ready for d/c.  PAIN:  Are you having pain? Yes: NPRS scale: 2-3/10 Pain location: low back Pain description: sore Aggravating factors: lifting Relieving factors:  unsure  OBJECTIVE: (objective measurements taken at eval unless otherwise dated)   [facility] PT Assessment - 07/09/22 0001       Assessment   Medical Diagnosis R26.9 (ICD-10-CM) - Gait difficulty    Referring Provider (PT) Armond Hang, MD    Onset Date/Surgical Date 02/22/22      Transfers   Five time sit to stand comments  11 sec      Berg Balance Test   Sit to Stand Able to stand without using hands and stabilize independently    Standing Unsupported Able to stand safely 2 minutes    Sitting with Back Unsupported but Feet Supported on Floor or Stool Able to sit safely and securely 2 minutes    Stand to Sit Sits safely with minimal use of hands    Transfers Able to transfer safely, minor use of hands    Standing Unsupported with Eyes Closed Able to stand 10 seconds safely    Standing Unsupported with Feet Together Able to place feet together  independently and stand 1 minute safely    From Standing, Reach Forward with Outstretched Arm Can reach confidently >25 cm (10")    From Standing Position, Pick up Object from [place] to pick up shoe safely and easily    From Standing Position, Turn to Look Behind Over each Shoulder Looks behind from both sides and weight shifts well    Turn 360 Degrees Able to turn 360 degrees safely one side only in 4 seconds or less    Standing Unsupported, Alternately Place Feet on Step/Stool Able to stand independently and safely and complete 8 steps in 20 seconds    Standing Unsupported, One Foot in [place] to plae foot ahead of the other independently and hold 30 seconds    Standing on One Leg Tries to lift leg/unable to hold 3 seconds but remains standing independently    Total Score 51    Berg comment: 51/56            Objective 07/13/22 Hip strength 4+/5 bilat    TODAY'S TREATMENT:  07/13/22 Nustep L5 x 5 min  Standing: Hip abd 2x10 red tband Hip ext 2x10 red tband Marching 2x10 red tband Mini squat 2x10 Tap ups 6'' step 2 x  10 Backward gait multiple reps with intermittent UE support  On foam: head turns vertically 2 x 30 sec Head turns horizontally 2 x 30 sec EC 2 x 30 sec  07/09/22 Nustep L5 x 5 min  Standing: Hip abd 2x10 red tband Hip ext 2x10 red tband   Marching 2x10 red tband Mini squat 2x10  Balance: SLS with one hand support 2x30 sec On foam:  Feet apart trunk rotations 2x10   Feet together trunk rotations 2x10 intermittent UE support  Feet together EC static balance 2x30 sec  Feet together EC head nods & then head turns 2x30 sec    07/06/22 Nustep L5 x 5 min  Standing: Hip abd 2 x 10 Hip extension 2 x 10 Mini squat 2 x 10 Hip flex 2 x 10 5x STS Without UE support  Balance: alternating tap ups 2 x 10 intermittent UE support On foam: EO feet together head nods and turns 2 x 30 sec each      EC static stance 2 x 30 sec      Standing feet together trunk rotations 2 x 10 intermittent UE support Semi tandem 2 x 30 sec Side step and backward gait with CGA for backward, focus on fully lifting feet to reduce fall risk Coordination box stepping initially with UE support then without UE support with CGA      PATIENT EDUCATION: Education details: HEP updates Person educated: Patient Education method: Explanation and Verbal cues Education comprehension: verbalized understanding   HOME EXERCISE PROGRAM: [email or web address]     PT Long Term Goals - 06/25/22 1138       PT LONG TERM GOAL #1   Title Pt will be independent with his strength/balance HEP    Time 6    Period Weeks    Status MET   Target Date 07/15/22      PT LONG TERM GOAL #2   Title Pt will have improved 5x STS to </=13 sec to demo low fall risk    Time 6    Period Weeks    Status Met -- 11 sec on 07/09/22    Target Date 07/15/22      PT LONG TERM GOAL #3   Title Pt will have improved Merrilee Jansky  Balance Score to at least 52/56 to demo low fall risk    Baseline 51/56 on 07/09/22   Time 6    Period Weeks    Status Partially met    Target Date 07/15/22      PT LONG TERM GOAL #4   Title Pt will demo at least 5/5 bilat glute strength    Time 6    Period Weeks    Status On going   Target Date 07/15/22              Assessment/Plan -       Clinical Impression Statement Pt has achieved goals and has become independent with HEP, ready for d/c    Personal Factors and Comorbidities Fitness;Time since onset of injury/illness/exacerbation     Examination-Activity Limitations Locomotion Level;Transfers;Stand     Stability/Clinical Decision Making Evolving/Moderate complexity     Clinical Decision Making Moderate     Rehab Potential Good     PT Frequency 2x / week     PT Duration 6 weeks     PT Treatment/Interventions ADLs/Self Care Home Management;Aquatic Therapy;Cryotherapy;Electrical Stimulation;Iontophoresis 4mg /ml Dexamethasone;Moist Heat;Ultrasound;Gait training;Stair training;Traction;Functional mobility training;Therapeutic activities;Therapeutic exercise;Neuromuscular re-education;Patient/family education;Manual techniques;Dry needling;Taping;Balance training     PT Next Visit Plan D/c         Consulted and Agree with Plan of Care Patient    PHYSICAL THERAPY DISCHARGE SUMMARY  Visits from Start of Care: 10  Current functional level related to goals / functional outcomes: Improved strength and balance   Remaining deficits: See above   Education / Equipment: HEP   Patient agrees to discharge. Patient goals were partially met. Patient is being discharged due to being pleased with the current functional level.    Tarahji Ramthun, PT, DPT 07/13/2022, 1:52 PM

## 2022-07-15 ENCOUNTER — Encounter: Payer: Medicare Other | Admitting: Rehabilitative and Restorative Service Providers"

## 2022-07-23 DIAGNOSIS — R350 Frequency of micturition: Secondary | ICD-10-CM | POA: Diagnosis not present

## 2022-08-03 DIAGNOSIS — M14672 Charcot's joint, left ankle and foot: Secondary | ICD-10-CM | POA: Diagnosis not present

## 2022-08-05 DIAGNOSIS — M4316 Spondylolisthesis, lumbar region: Secondary | ICD-10-CM | POA: Diagnosis not present

## 2022-08-05 DIAGNOSIS — M47816 Spondylosis without myelopathy or radiculopathy, lumbar region: Secondary | ICD-10-CM | POA: Diagnosis not present

## 2022-08-10 ENCOUNTER — Ambulatory Visit: Payer: Medicare Other | Attending: Neurological Surgery | Admitting: Rehabilitative and Restorative Service Providers"

## 2022-08-10 DIAGNOSIS — R293 Abnormal posture: Secondary | ICD-10-CM

## 2022-08-10 DIAGNOSIS — R2689 Other abnormalities of gait and mobility: Secondary | ICD-10-CM | POA: Diagnosis not present

## 2022-08-10 DIAGNOSIS — M545 Low back pain, unspecified: Secondary | ICD-10-CM

## 2022-08-10 DIAGNOSIS — R2681 Unsteadiness on feet: Secondary | ICD-10-CM

## 2022-08-10 DIAGNOSIS — M6281 Muscle weakness (generalized): Secondary | ICD-10-CM | POA: Diagnosis not present

## 2022-08-10 DIAGNOSIS — R29898 Other symptoms and signs involving the musculoskeletal system: Secondary | ICD-10-CM | POA: Diagnosis not present

## 2022-08-10 NOTE — Therapy (Addendum)
OUTPATIENT PHYSICAL THERAPY THORACOLUMBAR EVALUATION   Patient Name: Willie Pineda MRN: [id number] DOB:1946-08-10, 76 y.o., male Today's Date: 08/10/2022   PT End of Session - 08/10/22 1128     Visit Number 1    Number of Visits 16    Date for PT Re-Evaluation 10/05/22    Authorization Type UHC Medicare    Authorization - Visit Number 10    Progress Note Due on Visit 10    PT Start Time 1017    PT Stop Time 1115    PT Time Calculation (min) 58 min    Activity Tolerance Patient tolerated treatment well    Behavior During Therapy WFL for tasks assessed/performed             Past Medical History:  Diagnosis Date   AICD (automatic cardioverter/defibrillator) present    Arthritis    "hands primarily" (05/05/2016)   Atrial fibrillation (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)   Hyperlipidemia    takes Atorvastatin daily   Hypertension    takes Metoprolol,HCTZ,and Losartan daily.Takes Clonidine if needed   Hyperuricemia 07/14/2021   Nocturia    Peripheral neuropathy    takes Gabapentin daily   Peripheral vascular disease (HCC)    legs blockage    Pneumonia ?2015   Presence of permanent cardiac pacemaker    Restless leg    Sleep apnea    uses Bipap   Stroke [facility]) 2012   denies residual on 05/05/2016   Past Surgical History:  Procedure Laterality Date   AMPUTATION Left 04/03/2021   Procedure: LEFT 2ND RAY AMPUTATION;   Surgeon: Newt Minion, MD;  Location: [place];  Service: Orthopedics;  Laterality: Left;   CARPAL TUNNEL RELEASE     CATARACT EXTRACTION W/ INTRAOCULAR LENS  IMPLANT, BILATERAL Bilateral    COLONOSCOPY WITH PROPOFOL N/A 03/09/2017   Procedure: COLONOSCOPY WITH PROPOFOL;  Surgeon: Wonda Horner, MD;  Location: [facility] ENDOSCOPY;  Service: Endoscopy;  Laterality: N/A;   COLONOSCOPY WITH PROPOFOL N/A 04/03/2020   Procedure: COLONOSCOPY WITH PROPOFOL;  Surgeon: Wonda Horner, MD;  [facility] ENDOSCOPY;  Service: Endoscopy;  Laterality: N/A;   I & D KNEE WITH POLY EXCHANGE Right 05/04/2016   IRRIGATION AND DEBRIDEMENT Right KNEE WITH UNICOMPARTMENTAL POLY EXCHANGE   I & D KNEE WITH POLY EXCHANGE Right 05/04/2016   Procedure: IRRIGATION AND DEBRIDEMENT Right KNEE WITH UNICOMPARTMENTAL POLY EXCHANGE;  Surgeon: Renette Butters, MD;  Location: [place];  Service: Orthopedics;  Laterality: Right;   JOINT REPLACEMENT     LAPAROSCOPIC CHOLECYSTECTOMY  1995   PARTIAL KNEE ARTHROPLASTY Right 04/06/2016   Procedure: UNICOMPARTMENTAL RIGHT KNEE;  Surgeon: Renette Butters, MD;  Location: [place];  Service: Orthopedics;  Laterality: Right;   POLYPECTOMY  04/03/2020   Procedure: POLYPECTOMY;  Surgeon: Wonda Horner, MD;  [facility] ENDOSCOPY;  Service: Endoscopy;;   REVERSE SHOULDER ARTHROPLASTY Left 04/29/2021   Procedure: Revision antibiotic spacer placement. Incision and drainage.;  Surgeon: [name], MD;  Location: [facility] ORS;  Service: Orthopedics;  Laterality: Left;   REVERSE SHOULDER ARTHROPLASTY Left 09/30/2021   Procedure: REVERSE SHOULDER ARTHROPLASTY;  Surgeon: [name], MD;  Location: [facility];  Service: Orthopedics;  Laterality: Left;   STRABISMUS SURGERY Bilateral 06/10/2017   Procedure: REPAIR STRABISMUS BILATERAL;  Surgeon: Everitt Amber, MD;  Location: [place];  Service: Ophthalmology;  Laterality: Bilateral;   TONSILLECTOMY AND ADENOIDECTOMY     TOTAL KNEE ARTHROPLASTY  Left 2008   TOTAL SHOULDER ARTHROPLASTY Left 03/04/2021   Procedure: Total Shoulder Arthroplasty;  Surgeon: [name], MD;  Location: [facility] ORS;  Service: Orthopedics;  Laterality: Left;   ULNAR NERVE TRANSPOSITION Bilateral 1989   Patient Active Problem List   Diagnosis Date Noted   Charcot foot due to diabetes mellitus ([place]) 11/16/2021   Drug rash 11/16/2021   Status post reverse total replacement of left shoulder 09/30/2021   Hyperuricemia 07/14/2021   Septic joint of left shoulder region ([place]) 04/29/2021   Osteomyelitis of second toe of left foot (HCC)    Penicillin allergy    Chronic osteomyelitis (HCC)    Diabetic foot infection ([place])    S/p total knee replacement, bilateral    Septic arthritis of shoulder, left ([place]) 03/03/2021   Atrial fibrillation, chronic ([place]) 03/03/2021   Chronic anticoagulation 03/03/2021   DM (diabetes mellitus), type 2 with neurological complications ([place]) 70/26/3785   Essential hypertension 03/03/2021   Hypokalemia 03/03/2021   Prolonged QT interval 03/03/2021   COPD (chronic obstructive pulmonary disease) ([place]) 03/03/2021   Chronic CHF (congestive heart failure) ([place]) 03/03/2021   Diabetic peripheral neuropathy ([place]) 01/03/2019   Obesity 06/01/2016   Wound dehiscence 05/04/2016   Knee osteoarthritis 04/06/2016    PCP: Dr Josetta Huddle  REFERRING PROVIDER: Dr Sherley Bounds  REFERRING DIAG: Low back pain   Rationale for Evaluation and Treatment Rehabilitation  THERAPY DIAG:  Acute bilateral low back pain without sciatica - Plan: PT plan of care cert/re-cert  Muscle weakness (generalized) - Plan: PT plan of care cert/re-cert  Other abnormalities of gait and mobility - Plan: PT plan of care cert/re-cert  Abnormal posture - Plan: PT plan of care cert/re-cert  Other symptoms and signs involving the musculoskeletal system - Plan: PT plan of care cert/re-cert  Unsteadiness on feet - Plan: PT plan of care cert/re-cert  ONSET DATE:  07/13/49  SUBJECTIVE:  SUBJECTIVE STATEMENT: Patient reports that he experienced significant increase in LBP following a fall at home. He had increased pain in low back and to the Lt making it difficulty to move,stand form chair and walk.  PERTINENT HISTORY:  History of LBP; partial amputation Lt forefoot; Lt reverse TSA; bilat TKA; pacemaker/defibulator; Lt second toe amputation; HTN; CAD; stroke; AODM; cataract sx; sleep apnea; infection Lt shoulder/knee post joint replacements; tingling Lt hand fingertips, palmar surface following carpal tunnel release and ulnar nerve release ~ 3 years ago tingling continues on an intermittent basis  PAIN:  Are you having pain? Yes: NPRS scale: 6/10 Pain location: low back mid to Lt some spasms  Pain description: sharp Aggravating factors: moving Relieving factors: rest; using cane to walk; meds   PRECAUTIONS: None  WEIGHT BEARING RESTRICTIONS No  FALLS:  Has patient fallen in last 6 months? Yes. Number of falls 1  LIVING ENVIRONMENT: Lives with: lives with their family and lives with their spouse Lives in: House/apartment Stairs: No Has following equipment at home: Single point cane  OCCUPATION: retired Marine scientist  PLOF: Independent  PATIENT GOALS decrease pain in the back and improve functional activities    OBJECTIVE:   DIAGNOSTIC FINDINGS:  X-ray - arthritic changes in the lumbar spine   PATIENT SURVEYS:  FOTO 44  SCREENING FOR RED FLAGS: Bowel or bladder incontinence: No Spinal tumors: No Cauda equina syndrome: No Compression fracture: No Abdominal aneurysm: No  COGNITION:  Overall cognitive status: Within functional limits for tasks assessed     SENSATION: Numbness and tingling bilat LE's from [place]: Hamstrings: Right 70 deg;  Left 65 deg Thomas test: Left > Right  POSTURE: rounded shoulders, forward head, decreased lumbar lordosis, increased thoracic kyphosis, flexed trunk , and weight shift right  PALPATION: Muscular tightness Lt > Rt psoas; bilat lumbar paraspinals, QL, piriformis, glut med, glut min   LUMBAR ROM:   Active  A/PROM  eval  Flexion 70% pulling LB  Extension 30%  Right lateral flexion 70% tight Rt LB  Left lateral flexion 70% tight Lt LB  Right rotation 60%  Left rotation 50%   (Blank rows = not tested)  LOWER EXTREMITY ROM:     Tight end range bilat hips in flexion, extension, rotation bilat   LOWER EXTREMITY MMT:    MMT Right eval Left eval  Hip flexion 5/5 5/5  Hip extension 4/5 4-/5  Hip abduction 4+/5 4/5  Hip adduction    Hip internal rotation    Hip external rotation    Knee flexion    Knee extension    Ankle dorsiflexion    Ankle plantarflexion    Ankle inversion    Ankle eversion     (Blank rows = not tested)  LUMBAR SPECIAL TESTS:    SLR - negative bilat   GAIT: Distance walked: 40 Assistive device utilized: Single point cane Level of assistance: Complete Independence Comments: some limp Lt LE due to injury to Lt foot, just out of walking boot     TODAY'S TREATMENT  Exercises; Standing trunk extension Prone press up  Trigger Point Dry-Needling  Treatment instructions: Expect mild to moderate muscle soreness. S/S of pneumothorax if dry needled over a lung field, and to seek immediate medical attention should they occur. Patient verbalized understanding of these instructions and education.  Patient Consent Given: Yes Education handout provided: Previously provided Muscles treated: bilat lumbar pasraspinals; piriformis; glut mas/med Electrical stimulation performed: Yes Parameters:  mAmp current x 7  min  Treatment response/outcome: decreased palpable tightness noted     PATIENT EDUCATION:  Education details: POC; HEP; DN Person educated:  Patient Education method: Explanation, Demonstration, Tactile cues, Verbal cues, and Handouts Education comprehension: verbalized understanding, returned demonstration, verbal cues required, tactile cues required, and needs further education   HOME EXERCISE PROGRAM: Access Code: [email or web address] URL: [email or web address] Date: 08/10/2022 Prepared by: Gillermo Murdoch  Exercises - Prone Press Up  - 2 x daily - 7 x weekly - 1 sets - 10 reps - 2-3 sec  hold - Standing Lumbar Extension  - 2 x daily - 7 x weekly - 1 sets - 2-3 reps - 2-3 sec  hold  ASSESSMENT:  CLINICAL IMPRESSION: Patient is a 76 y.o. make who was seen today for physical therapy evaluation and treatment for low back pain which has increased in the past month. Patient did have a fall at home in the shower that he feels may have caused LBP. He does have a history of intermittent LBP over the past 30 years following an injury when riding a horse. He presents today with abnormal posture and alignment; limited trunk and LE mobility, ROM, strength; decreased core stability/strength. He has increased difficulty with functional activities and gait. Patient will benefit from PT to address problems identified.    OBJECTIVE IMPAIRMENTS Abnormal gait, decreased activity tolerance, decreased balance, decreased endurance, decreased mobility, decreased ROM, decreased strength, impaired flexibility, improper body mechanics, postural dysfunction, obesity, and pain.   ACTIVITY LIMITATIONS carrying, lifting, bending, sitting, standing, squatting, sleeping, stairs, transfers, and bed mobility  PARTICIPATION LIMITATIONS: cleaning, laundry, community activity, and yard work  PERSONAL FACTORS Age, Fitness, Past/current experiences, and 1-2 comorbidities: prior history of LBP; obesity  are also affecting patient's functional outcome.   REHAB POTENTIAL: Good  CLINICAL DECISION MAKING: Stable/uncomplicated  EVALUATION COMPLEXITY:  Low   GOALS: Goals reviewed with patient? Yes  LONG TERM GOALS: Target date: 10/05/2022  Decrease pain in low back and bilat hips by 50-75% allowing patient to increase functional activity level Baseline:  Goal status: INITIAL  2.  Improve posture and alignment with patient to demonstrate improved upright posture with neutral hip extension  Baseline:  Goal status: INITIAL  3.  Increased LE strength to 4+/5 to 5/5 in areas of weakness  Baseline:  Goal status: INITIAL  4.  Improve gait endurance with patient to report ability to ambulate community distances without increased pain Baseline:  Goal status: INITIAL  5.  Independent in HEP  Baseline:  Goal status: INITIAL  6.  Improve functional limitation score to 60 Baseline:  Goal status: INITIAL   PLAN: PT FREQUENCY: 2x/week  PT DURATION: 8 weeks  PLANNED INTERVENTIONS: Therapeutic exercises, Therapeutic activity, Neuromuscular re-education, Balance training, Gait training, Patient/Family education, Self Care, Joint mobilization, Aquatic Therapy, Dry Needling, Electrical stimulation, Cryotherapy, and Moist heat.  PLAN FOR NEXT SESSION: Assess response to DN and manual work; review and progress with exercise; back care and body mechanics education; modalities as indicated.   Everardo All, PT, MPH  08/10/2022, 12:36 PM

## 2022-08-11 ENCOUNTER — Ambulatory Visit: Payer: Medicare Other | Admitting: Rehabilitative and Restorative Service Providers"

## 2022-08-11 ENCOUNTER — Encounter: Payer: Self-pay | Admitting: Rehabilitative and Restorative Service Providers"

## 2022-08-11 DIAGNOSIS — R293 Abnormal posture: Secondary | ICD-10-CM

## 2022-08-11 DIAGNOSIS — M545 Low back pain, unspecified: Secondary | ICD-10-CM | POA: Diagnosis not present

## 2022-08-11 DIAGNOSIS — M6281 Muscle weakness (generalized): Secondary | ICD-10-CM

## 2022-08-11 DIAGNOSIS — R2689 Other abnormalities of gait and mobility: Secondary | ICD-10-CM

## 2022-08-11 DIAGNOSIS — R2681 Unsteadiness on feet: Secondary | ICD-10-CM | POA: Diagnosis not present

## 2022-08-11 DIAGNOSIS — R29898 Other symptoms and signs involving the musculoskeletal system: Secondary | ICD-10-CM

## 2022-08-11 NOTE — Therapy (Signed)
OUTPATIENT PHYSICAL THERAPY THORACOLUMBAR EVALUATION   Patient Name: DEVANTAE BABE MRN: [id number] DOB:[birth year], 76 y.o., male Today's Date: 08/11/2022   PT End of Session - 08/11/22 1026     Visit Number 2    Number of Visits 16    Date for PT Re-Evaluation 10/05/22    Authorization Type [employer]    Authorization - Visit Number 2    Progress Note Due on Visit 10    PT Start Time 1025   pt late for appt   PT Stop Time 1108    PT Time Calculation (min) 43 min    Activity Tolerance Patient tolerated treatment well             Past Medical History:  Diagnosis Date   AICD (automatic cardioverter/defibrillator) present    Arthritis    "hands primarily" (05/05/2016)   Atrial fibrillation (HCC)    Back pain    age related   CHF (congestive heart failure) (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)   Hyperlipidemia    takes Atorvastatin daily   Hypertension    takes Metoprolol,HCTZ,and Losartan daily.Takes Clonidine if needed   Hyperuricemia 07/14/2021   Nocturia    Peripheral neuropathy    takes Gabapentin daily   Peripheral vascular disease (HCC)    legs blockage    Pneumonia ?2015   Presence of permanent cardiac pacemaker    Restless leg    Sleep apnea    uses Bipap   Stroke [facility]) 2012   denies residual on 05/05/2016   Past Surgical History:  Procedure Laterality Date   AMPUTATION Left 04/03/2021   Procedure: LEFT 2ND RAY AMPUTATION;  Surgeon: Newt Minion, MD;  Location: [place];   Service: Orthopedics;  Laterality: Left;   CARPAL TUNNEL RELEASE     CATARACT EXTRACTION W/ INTRAOCULAR LENS  IMPLANT, BILATERAL Bilateral    COLONOSCOPY WITH PROPOFOL N/A 03/09/2017   Procedure: COLONOSCOPY WITH PROPOFOL;  Surgeon: Wonda Horner, MD;  Location: [facility] ENDOSCOPY;  Service: Endoscopy;  Laterality: N/A;   COLONOSCOPY WITH PROPOFOL N/A 04/03/2020   Procedure: COLONOSCOPY WITH PROPOFOL;  Surgeon: Wonda Horner, MD;  Location: [facility] ENDOSCOPY;  Service: Endoscopy;  Laterality: N/A;   I & D KNEE WITH POLY EXCHANGE Right 05/04/2016   IRRIGATION AND DEBRIDEMENT Right KNEE WITH UNICOMPARTMENTAL POLY EXCHANGE   I & D KNEE WITH POLY EXCHANGE Right 05/04/2016   Procedure: IRRIGATION AND DEBRIDEMENT Right KNEE WITH UNICOMPARTMENTAL POLY EXCHANGE;  Surgeon: Renette Butters, MD;  Location: [place];  Service: Orthopedics;  Laterality: Right;   JOINT REPLACEMENT     LAPAROSCOPIC CHOLECYSTECTOMY  1995   PARTIAL KNEE ARTHROPLASTY Right 04/06/2016   Procedure: UNICOMPARTMENTAL RIGHT KNEE;  Surgeon: Renette Butters, MD;  Location: [place];  Service: Orthopedics;  Laterality: Right;   POLYPECTOMY  04/03/2020   Procedure: POLYPECTOMY;  Surgeon: Wonda Horner, MD;  Location: [facility] ENDOSCOPY;  Service: Endoscopy;;   REVERSE SHOULDER ARTHROPLASTY Left 04/29/2021   Procedure: Revision antibiotic spacer placement. Incision and drainage.;  Surgeon: [name], MD;  Location: [facility] ORS;  Service: Orthopedics;  Laterality: Left;   REVERSE SHOULDER ARTHROPLASTY Left 09/30/2021   Procedure: REVERSE SHOULDER ARTHROPLASTY;  Surgeon: [name], MD;  Location: [facility];  Service: Orthopedics;  Laterality: Left;   STRABISMUS SURGERY Bilateral 06/10/2017   Procedure: REPAIR STRABISMUS BILATERAL;  Surgeon: Everitt Amber, MD;  Location: [place];  Service: Ophthalmology;  Laterality: Bilateral;   TONSILLECTOMY AND ADENOIDECTOMY     TOTAL KNEE ARTHROPLASTY Left 2008   TOTAL SHOULDER ARTHROPLASTY Left  03/04/2021   Procedure: Total Shoulder Arthroplasty;  Surgeon: [name], MD;  Location: [facility] OR[facility];  Service: Orthopedics;  Laterality: Left;   ULNAR NERVE TRANSPOSITION Bilateral 1989   Patient Active Problem List   Diagnosis Date Noted   Charcot foot due to diabetes mellitus ([place]) 11/16/2021   Drug rash 11/16/2021   Status post reverse total replacement of left shoulder 09/30/2021   Hyperuricemia 07/14/2021   Septic joint of left shoulder region ([place]) 04/29/2021   Osteomyelitis of second toe of left foot (HCC)    Penicillin allergy    Chronic osteomyelitis ([place])    Diabetic foot infection ([place])    S/p total knee replacement, bilateral    Septic arthritis of shoulder, left ([place]) 03/03/2021   Atrial fibrillation, chronic ([place]) 03/03/2021   Chronic anticoagulation 03/03/2021   DM (diabetes mellitus), type 2 with neurological complications ([place]) 65/68/1275   Essential hypertension 03/03/2021   Hypokalemia 03/03/2021   Prolonged QT interval 03/03/2021   COPD (chronic obstructive pulmonary disease) ([place]) 03/03/2021   Chronic CHF (congestive heart failure) ([place]) 03/03/2021   Diabetic peripheral neuropathy ([place]) 01/03/2019   Obesity 06/01/2016   Wound dehiscence 05/04/2016   Knee osteoarthritis 04/06/2016    PCP: Dr Josetta Huddle  REFERRING PROVIDER: Dr Sherley Bounds  REFERRING DIAG: Low back pain   Rationale for Evaluation and Treatment Rehabilitation  THERAPY DIAG:  Acute bilateral low back pain without sciatica  Muscle weakness (generalized)  Other abnormalities of gait and mobility  Abnormal posture  Other symptoms and signs involving the musculoskeletal system  ONSET DATE: 07/06/22  SUBJECTIVE:                                                                                                                                                                                           SUBJECTIVE STATEMENT:  08/11/22: patient reports that he had no pain for most of the day  yesterday- only a couple of spasms in  the afternoon but they didn't last. Awoke with stiffness this morning but not the usual pain. Pleased with response to DN  Eval: Patient reports that he experienced significant increase in LBP following a fall at home. He had increased pain in low back and to the Lt making it difficulty to move,stand form chair and walk.   PERTINENT HISTORY:  History of LBP; partial amputation Lt forefoot; Lt reverse TSA; bilat TKA; pacemaker/defibulator; Lt second toe amputation; HTN; CAD; stroke; AODM; cataract sx; sleep apnea; infection Lt shoulder/knee post joint replacements; tingling Lt hand fingertips, palmar surface following carpal tunnel release and ulnar nerve release ~ 3 years ago tingling continues on an intermittent basis  PAIN:  Are you having pain? Yes: NPRS scale: 3/10 Pain location: low back mid to Lt some spasms  Pain description: sharp Aggravating factors: moving Relieving factors: rest; using cane to walk; meds   PRECAUTIONS: None  WEIGHT BEARING RESTRICTIONS No  FALLS:  Has patient fallen in last 6 months? Yes. Number of falls 1  LIVING ENVIRONMENT: Lives with: lives with their family and lives with their spouse Lives in: House/apartment Stairs: No Has following equipment at home: Single point cane  OCCUPATION: retired Marine scientist  PLOF: Independent  PATIENT GOALS decrease pain in the back and improve functional activities    OBJECTIVE:   DIAGNOSTIC FINDINGS:  X-ray - arthritic changes in the lumbar spine   PATIENT SURVEYS:  FOTO 44  SCREENING FOR RED FLAGS: Bowel or bladder incontinence: No Spinal tumors: No Cauda equina syndrome: No Compression fracture: No Abdominal aneurysm: No  COGNITION:  Overall cognitive status: Within functional limits for tasks assessed     SENSATION: Numbness and tingling bilat LE's from AODM   MUSCLE LENGTH: Hamstrings: Right 70 deg; Left 65 deg Thomas test: Left > Right  POSTURE: rounded  shoulders, forward head, decreased lumbar lordosis, increased thoracic kyphosis, flexed trunk , and weight shift right  PALPATION: Muscular tightness Lt > Rt psoas; bilat lumbar paraspinals, QL, piriformis, glut med, glut min   LUMBAR ROM:   Active  A/PROM  eval  Flexion 70% pulling LB  Extension 30%  Right lateral flexion 70% tight Rt LB  Left lateral flexion 70% tight Lt LB  Right rotation 60%  Left rotation 50%   (Blank rows = not tested)  LOWER EXTREMITY ROM:     Tight end range bilat hips in flexion, extension, rotation bilat   LOWER EXTREMITY MMT:    MMT Right eval Left eval  Hip flexion 5/5 5/5  Hip extension 4/5 4-/5  Hip abduction 4+/5 4/5  Hip adduction    Hip internal rotation    Hip external rotation    Knee flexion    Knee extension    Ankle dorsiflexion    Ankle plantarflexion    Ankle inversion    Ankle eversion     (Blank rows = not tested)  LUMBAR SPECIAL TESTS:    SLR - negative bilat   GAIT: Distance walked: 40 Assistive device utilized: Single point cane Level of assistance: Complete Independence Comments: some limp Lt LE due to injury to Lt foot, just out of walking boot     TODAY'S TREATMENT  Exercises; Standing trunk extension Prone press up Piriformis stretch supine travell 30 sec x 2  Hip flexor stretch modified thomas 30 sec x 2   Trigger Point Dry-Needling   08/11/22: Treatment instructions: Expect mild to moderate muscle soreness. S/S of pneumothorax if dry needled over a lung field, and to  seek immediate medical attention should they occur. Patient verbalized understanding of these instructions and education.  Patient Consent Given: Yes Education handout provided: Previously provided Muscles treated: bilat lumbar pasraspinals; piriformis; glut max/med Electrical stimulation performed: Yes Parameters:  mAmp current x 5 min  Treatment response/outcome: decreased palpable tightness noted     Modalities    MH lumbar spine x  10 min   PATIENT EDUCATION:  Education details: POC; HEP; DN Person educated: Patient Education method: Explanation, Demonstration, Tactile cues, Verbal cues, and Handouts Education comprehension: verbalized understanding, returned demonstration, verbal cues required, tactile cues required, and needs further education   HOME EXERCISE PROGRAM:  Access Code: [email or web address] URL: [email or web address] Date: 08/11/2022 Prepared by: Gillermo Murdoch  Exercises - Prone Press Up  - 2 x daily - 7 x weekly - 1 sets - 10 reps - 2-3 sec  hold - Standing Lumbar Extension  - 2 x daily - 7 x weekly - 1 sets - 2-3 reps - 2-3 sec  hold - Supine Piriformis Stretch with Leg Straight  - 2 x daily - 7 x weekly - 1 sets - 3 reps - 30 sec  hold - Hip Flexor Stretch at Edge of Bed  - 2 x daily - 7 x weekly - 1 sets - 3 reps - 30 sec  hold - Supine Transversus Abdominis Bracing with Pelvic Floor Contraction  - 2 x daily - 7 x weekly - 1 sets - 10 reps - 10sec  hold  ASSESSMENT:  08/11/22:  Patient reports good response to DN and initial treatment. Reviewed and progressed exercises without difficulty.    CLINICAL IMPRESSION: Patient is a 76 y.o. make who was seen today for physical therapy evaluation and treatment for low back pain which has increased in the past month. Patient did have a fall at home in the shower that he feels may have caused LBP. He does have a history of intermittent LBP over the past 30 years following an injury when riding a horse. He presents today with abnormal posture and alignment; limited trunk and LE mobility, ROM, strength; decreased core stability/strength. He has increased difficulty with functional activities and gait. Patient will benefit from PT to address problems identified.    OBJECTIVE IMPAIRMENTS Abnormal gait, decreased activity tolerance, decreased balance, decreased endurance, decreased mobility, decreased ROM, decreased strength, impaired flexibility, improper body  mechanics, postural dysfunction, obesity, and pain.   ACTIVITY LIMITATIONS carrying, lifting, bending, sitting, standing, squatting, sleeping, stairs, transfers, and bed mobility  PARTICIPATION LIMITATIONS: cleaning, laundry, community activity, and yard work  PERSONAL FACTORS Age, Fitness, Past/current experiences, and 1-2 comorbidities: prior history of LBP; obesity  are also affecting patient's functional outcome.   REHAB POTENTIAL: Good  CLINICAL DECISION MAKING: Stable/uncomplicated  EVALUATION COMPLEXITY: Low   GOALS: Goals reviewed with patient? Yes  LONG TERM GOALS: Target date: 10/05/2022  Decrease pain in low back and bilat hips by 50-75% allowing patient to increase functional activity level Baseline:  Goal status: INITIAL  2.  Improve posture and alignment with patient to demonstrate improved upright posture with neutral hip extension  Baseline:  Goal status: INITIAL  3.  Increased LE strength to 4+/5 to 5/5 in areas of weakness  Baseline:  Goal status: INITIAL  4.  Improve gait endurance with patient to report ability to ambulate community distances without increased pain Baseline:  Goal status: INITIAL  5.  Independent in HEP  Baseline:  Goal status: INITIAL  6.  Improve functional limitation score to 60 Baseline:  Goal status: INITIAL   PLAN: PT FREQUENCY: 2x/week  PT DURATION: 8 weeks  PLANNED INTERVENTIONS: Therapeutic exercises, Therapeutic activity, Neuromuscular re-education, Balance training, Gait training, Patient/Family education, Self Care, Joint mobilization, Aquatic Therapy, Dry Needling, Electrical stimulation, Cryotherapy, and Moist heat.  PLAN FOR NEXT SESSION: continue DN and manual work; review and progress with exercise; back care and body mechanics education; modalities as indicated.   Everardo All, PT, MPH  08/11/2022, 10:53 AM

## 2022-08-16 ENCOUNTER — Ambulatory Visit: Payer: Medicare Other | Admitting: Rehabilitative and Restorative Service Providers"

## 2022-08-17 ENCOUNTER — Ambulatory Visit: Payer: Medicare Other | Admitting: Rehabilitative and Restorative Service Providers"

## 2022-08-18 DIAGNOSIS — Z95810 Presence of automatic (implantable) cardiac defibrillator: Secondary | ICD-10-CM | POA: Diagnosis not present

## 2022-08-18 DIAGNOSIS — I4821 Permanent atrial fibrillation: Secondary | ICD-10-CM | POA: Diagnosis not present

## 2022-08-18 DIAGNOSIS — G4733 Obstructive sleep apnea (adult) (pediatric): Secondary | ICD-10-CM | POA: Diagnosis not present

## 2022-08-18 DIAGNOSIS — I77810 Thoracic aortic ectasia: Secondary | ICD-10-CM | POA: Diagnosis not present

## 2022-08-18 DIAGNOSIS — Z01810 Encounter for preprocedural cardiovascular examination: Secondary | ICD-10-CM | POA: Diagnosis not present

## 2022-08-19 DIAGNOSIS — I4819 Other persistent atrial fibrillation: Secondary | ICD-10-CM | POA: Diagnosis not present

## 2022-08-19 DIAGNOSIS — Z9181 History of falling: Secondary | ICD-10-CM | POA: Diagnosis not present

## 2022-08-19 DIAGNOSIS — I483 Typical atrial flutter: Secondary | ICD-10-CM | POA: Diagnosis not present

## 2022-08-19 DIAGNOSIS — G4730 Sleep apnea, unspecified: Secondary | ICD-10-CM | POA: Diagnosis not present

## 2022-08-19 DIAGNOSIS — I110 Hypertensive heart disease with heart failure: Secondary | ICD-10-CM | POA: Diagnosis not present

## 2022-08-19 DIAGNOSIS — I4892 Unspecified atrial flutter: Secondary | ICD-10-CM | POA: Diagnosis not present

## 2022-08-19 DIAGNOSIS — I371 Nonrheumatic pulmonary valve insufficiency: Secondary | ICD-10-CM | POA: Diagnosis not present

## 2022-08-19 DIAGNOSIS — I4811 Longstanding persistent atrial fibrillation: Secondary | ICD-10-CM | POA: Diagnosis not present

## 2022-08-19 DIAGNOSIS — E119 Type 2 diabetes mellitus without complications: Secondary | ICD-10-CM | POA: Diagnosis not present

## 2022-08-19 DIAGNOSIS — J449 Chronic obstructive pulmonary disease, unspecified: Secondary | ICD-10-CM | POA: Diagnosis not present

## 2022-08-19 DIAGNOSIS — I4821 Permanent atrial fibrillation: Secondary | ICD-10-CM | POA: Diagnosis not present

## 2022-08-19 DIAGNOSIS — E785 Hyperlipidemia, unspecified: Secondary | ICD-10-CM | POA: Diagnosis not present

## 2022-08-19 DIAGNOSIS — I4510 Unspecified right bundle-branch block: Secondary | ICD-10-CM | POA: Diagnosis not present

## 2022-08-19 DIAGNOSIS — I10 Essential (primary) hypertension: Secondary | ICD-10-CM | POA: Diagnosis not present

## 2022-08-19 DIAGNOSIS — G4733 Obstructive sleep apnea (adult) (pediatric): Secondary | ICD-10-CM | POA: Diagnosis not present

## 2022-08-19 DIAGNOSIS — I5022 Chronic systolic (congestive) heart failure: Secondary | ICD-10-CM | POA: Diagnosis not present

## 2022-08-19 DIAGNOSIS — Z8673 Personal history of transient ischemic attack (TIA), and cerebral infarction without residual deficits: Secondary | ICD-10-CM | POA: Diagnosis not present

## 2022-08-19 DIAGNOSIS — I428 Other cardiomyopathies: Secondary | ICD-10-CM | POA: Diagnosis not present

## 2022-08-24 DIAGNOSIS — I739 Peripheral vascular disease, unspecified: Secondary | ICD-10-CM | POA: Diagnosis not present

## 2022-08-24 DIAGNOSIS — Z23 Encounter for immunization: Secondary | ICD-10-CM | POA: Diagnosis not present

## 2022-08-24 DIAGNOSIS — G4733 Obstructive sleep apnea (adult) (pediatric): Secondary | ICD-10-CM | POA: Diagnosis not present

## 2022-08-24 DIAGNOSIS — E1165 Type 2 diabetes mellitus with hyperglycemia: Secondary | ICD-10-CM | POA: Diagnosis not present

## 2022-08-24 DIAGNOSIS — E785 Hyperlipidemia, unspecified: Secondary | ICD-10-CM | POA: Diagnosis not present

## 2022-08-24 DIAGNOSIS — J449 Chronic obstructive pulmonary disease, unspecified: Secondary | ICD-10-CM | POA: Diagnosis not present

## 2022-08-24 DIAGNOSIS — I4891 Unspecified atrial fibrillation: Secondary | ICD-10-CM | POA: Diagnosis not present

## 2022-08-24 DIAGNOSIS — I509 Heart failure, unspecified: Secondary | ICD-10-CM | POA: Diagnosis not present

## 2022-08-24 DIAGNOSIS — I10 Essential (primary) hypertension: Secondary | ICD-10-CM | POA: Diagnosis not present

## 2022-09-01 ENCOUNTER — Encounter: Payer: Self-pay | Admitting: Rehabilitative and Restorative Service Providers"

## 2022-09-01 ENCOUNTER — Ambulatory Visit: Payer: Medicare Other | Admitting: Rehabilitative and Restorative Service Providers"

## 2022-09-01 DIAGNOSIS — M545 Low back pain, unspecified: Secondary | ICD-10-CM

## 2022-09-01 DIAGNOSIS — R2689 Other abnormalities of gait and mobility: Secondary | ICD-10-CM

## 2022-09-01 DIAGNOSIS — R29898 Other symptoms and signs involving the musculoskeletal system: Secondary | ICD-10-CM

## 2022-09-01 DIAGNOSIS — R293 Abnormal posture: Secondary | ICD-10-CM

## 2022-09-01 DIAGNOSIS — R2681 Unsteadiness on feet: Secondary | ICD-10-CM | POA: Diagnosis not present

## 2022-09-01 DIAGNOSIS — M6281 Muscle weakness (generalized): Secondary | ICD-10-CM | POA: Diagnosis not present

## 2022-09-01 NOTE — Therapy (Addendum)
OUTPATIENT PHYSICAL THERAPY THORACOLUMBAR EVALUATION   Patient Name: Willie Pineda MRN: [id number] DOB:[birth year], 76 y.o., male Today's Date: 09/01/2022   PT End of Session - 09/01/22 1108     Visit Number 3    Number of Visits 16    Date for PT Re-Evaluation 10/05/22    Authorization Type [employer] Medicare    Authorization - Visit Number 3    Progress Note Due on Visit 10    PT Start Time 1105    PT Stop Time 1150    PT Time Calculation (min) 45 min    Activity Tolerance Patient tolerated treatment well             Past Medical History:  Diagnosis Date   AICD (automatic cardioverter/defibrillator) present    Arthritis    "hands primarily" (05/05/2016)   Atrial fibrillation (HCC)    Back pain    age related   CHF (congestive heart failure) ([place])    no problemes since 9937   Complication of anesthesia    Bronchial spasms   COPD (chronic obstructive pulmonary disease) (HCC)    inhalers as needed   Coronary artery disease    Depression    takes Zoloft daily   Diabetic peripheral neuropathy ([place]) 01/03/2019   Drug rash 11/16/2021   Dysrhythmia    A Fib-takes Xarelto daily   Enlarged prostate    benign   GERD (gastroesophageal reflux disease)    takes Omeprazole daily   Gout    History of blood clots    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)   Hyperlipidemia    takes Atorvastatin daily   Hypertension    takes Metoprolol,HCTZ,and Losartan daily.Takes Clonidine if needed   Hyperuricemia 07/14/2021   Nocturia    Peripheral neuropathy    takes Gabapentin daily   Peripheral vascular disease (HCC)    legs blockage    Pneumonia ?2015   Presence of permanent cardiac pacemaker    Restless leg    Sleep apnea    uses Bipap   Stroke [facility]) 2012   denies residual on 05/05/2016   Past Surgical History:  Procedure Laterality Date   AMPUTATION Left 04/03/2021   Procedure: LEFT 2ND RAY AMPUTATION;  Surgeon: Newt Minion, MD;  Location: [place];  Service:  Orthopedics;  Laterality: Left;   CARPAL TUNNEL RELEASE     CATARACT EXTRACTION W/ INTRAOCULAR LENS  IMPLANT, BILATERAL Bilateral    COLONOSCOPY WITH PROPOFOL N/A 03/09/2017   Procedure: COLONOSCOPY WITH PROPOFOL;  Surgeon: Wonda Horner, MD;  Location[facility] ENDOSCOPY;  Service: Endoscopy;  Laterality: N/A;   COLONOSCOPY WITH PROPOFOL N/A 04/03/2020   Procedure: COLONOSCOPY WITH PROPOFOL;  Surgeon: Wonda Horner, MD;  Location: [facility] ENDOSCOPY;  Service: Endoscopy;  Laterality: N/A;   I & D KNEE WITH POLY EXCHANGE Right 05/04/2016   IRRIGATION AND DEBRIDEMENT Right KNEE WITH UNICOMPARTMENTAL POLY EXCHANGE   I & D KNEE WITH POLY EXCHANGE Right 05/04/2016   Procedure: IRRIGATION AND DEBRIDEMENT Right KNEE WITH UNICOMPARTMENTAL POLY EXCHANGE;  Surgeon: Renette Butters, MD;  Location: [place];  Service: Orthopedics;  Laterality: Right;   JOINT REPLACEMENT     LAPAROSCOPIC CHOLECYSTECTOMY  1995   PARTIAL KNEE ARTHROPLASTY Right 04/06/2016   Procedure: UNICOMPARTMENTAL RIGHT KNEE;  Surgeon: Renette Butters, MD;  Location: [place];  Service: Orthopedics;  Laterality: Right;   POLYPECTOMY  04/03/2020   Procedure: POLYPECTOMY;  Surgeon: Wonda Horner, MD;  Location: [facility] ENDOSCOPY;  Service: Endoscopy;;   REVERSE SHOULDER ARTHROPLASTY Left 04/29/2021   Procedure: Revision antibiotic spacer placement. Incision and drainage.;  Surgeon: [name], MD;  Location: [facility];  Service: Orthopedics;  Laterality: Left;   REVERSE SHOULDER ARTHROPLASTY Left 09/30/2021   Procedure: REVERSE SHOULDER ARTHROPLASTY;  Surgeon: [name], MD;  Location: [facility];  Service: Orthopedics;  Laterality: Left;   STRABISMUS SURGERY Bilateral 06/10/2017   Procedure: REPAIR STRABISMUS BILATERAL;  Surgeon: Everitt Amber, MD;  Location: [place];  Service: Ophthalmology;  Laterality: Bilateral;   TONSILLECTOMY AND ADENOIDECTOMY     TOTAL KNEE ARTHROPLASTY Left 2008   TOTAL SHOULDER ARTHROPLASTY Left 03/04/2021    Procedure: Total Shoulder Arthroplasty;  Surgeon: [name], MD;  Location: [facility];  Service: Orthopedics;  Laterality: Left;   ULNAR NERVE TRANSPOSITION Bilateral 1989   Patient Active Problem List   Diagnosis Date Noted   Charcot foot due to diabetes mellitus ([place]) 11/16/2021   Drug rash 11/16/2021   Status post reverse total replacement of left shoulder 09/30/2021   Hyperuricemia 07/14/2021   Septic joint of left shoulder region ([place]) 04/29/2021   Osteomyelitis of second toe of left foot (HCC)    Penicillin allergy    Chronic osteomyelitis (HCC)    Diabetic foot infection ([place])    S/p total knee replacement, bilateral    Septic arthritis of shoulder, left ([place] [address]) 03/03/2021   Atrial fibrillation, chronic ([place]) 03/03/2021   Chronic anticoagulation 03/03/2021   DM (diabetes mellitus), type 2 with neurological complications ([place]) 24/26/8341   Essential hypertension 03/03/2021   Hypokalemia 03/03/2021   Prolonged QT interval 03/03/2021   COPD (chronic obstructive pulmonary disease) ([place]) 03/03/2021   Chronic CHF (congestive heart failure) ([place]) 03/03/2021   Diabetic peripheral neuropathy ([place]) 01/03/2019   Obesity 06/01/2016   Wound dehiscence 05/04/2016   Knee osteoarthritis 04/06/2016    PCP: Dr Josetta Huddle  REFERRING PROVIDER: Dr Sherley Bounds  REFERRING DIAG: Low back pain   Rationale for Evaluation and Treatment Rehabilitation  THERAPY DIAG:  Acute bilateral low back pain without sciatica  Muscle weakness (generalized)  Other abnormalities of gait and mobility  Abnormal posture  Other symptoms and signs involving the musculoskeletal system  ONSET DATE: 07/06/22  SUBJECTIVE:                                                                                                                                                                                           SUBJECTIVE STATEMENT:  09/01/22: Patient had a cardiac ablation which went well. Patient reports that he  had some pain in the Rt > Lt LB - some  days better than others. Now out of the walking boot and that has helped not limping so much and lifting the Lt LE. DN really helps. Has not been consistent with exercises due to wife being hospitalized and dealing with health issues in the past 2 weeks.    Eval: Patient reports that he experienced significant increase in LBP following a fall at home. He had increased pain in low back and to the Lt making it difficulty to move,stand form chair and walk.   PERTINENT HISTORY:  History of LBP; partial amputation Lt forefoot; Lt reverse TSA; bilat TKA; pacemaker/defibulator; Lt second toe amputation; HTN; CAD; stroke; AODM; cataract sx; sleep apnea; infection Lt shoulder/knee post joint replacements; tingling Lt hand fingertips, palmar surface following carpal tunnel release and ulnar nerve release ~ 3 years ago tingling continues on an intermittent basis  PAIN:  Are you having pain? Yes: NPRS scale: 6/10 Pain location: low back mid to Lt some spasms  Pain description: sharp Aggravating factors: moving Relieving factors: rest; using cane to walk; meds   PRECAUTIONS: None  WEIGHT BEARING RESTRICTIONS No  FALLS:  Has patient fallen in last 6 months? Yes. Number of falls 1  LIVING ENVIRONMENT: Lives with: lives with their family and lives with their spouse Lives in: House/apartment Stairs: No Has following equipment at home: Single point cane  OCCUPATION: retired Marine scientist  PLOF: Independent  PATIENT GOALS decrease pain in the back and improve functional activities    OBJECTIVE:   DIAGNOSTIC FINDINGS:  X-ray - arthritic changes in the lumbar spine   PATIENT SURVEYS:  FOTO 44  SCREENING FOR RED FLAGS: Bowel or bladder incontinence: No Spinal tumors: No Cauda equina syndrome: No Compression fracture: No Abdominal aneurysm: No  COGNITION:  Overall cognitive status: Within functional limits for tasks assessed     SENSATION: Numbness and  tingling bilat LE's from AODM   MUSCLE LENGTH: Hamstrings: Right 70 deg; Left 65 deg Thomas test: Left > Right  POSTURE: rounded shoulders, forward head, decreased lumbar lordosis, increased thoracic kyphosis, flexed trunk , and weight shift right  PALPATION: Muscular tightness Lt > Rt psoas; bilat lumbar paraspinals, QL, piriformis, glut med, glut min   LUMBAR ROM:   Active  A/PROM  eval  Flexion 70% pulling LB  Extension 30%  Right lateral flexion 70% tight Rt LB  Left lateral flexion 70% tight Lt LB  Right rotation 60%  Left rotation 50%   (Blank rows = not tested)  LOWER EXTREMITY ROM:     Tight end range bilat hips in flexion, extension, rotation bilat   LOWER EXTREMITY MMT:    MMT Right eval Left eval  Hip flexion 5/5 5/5  Hip extension 4/5 4-/5  Hip abduction 4+/5 4/5  Hip adduction    Hip internal rotation    Hip external rotation    Knee flexion    Knee extension    Ankle dorsiflexion    Ankle plantarflexion    Ankle inversion    Ankle eversion     (Blank rows = not tested)  LUMBAR SPECIAL TESTS:    SLR - negative bilat   GAIT: Distance walked: 40 Assistive device utilized: Single point cane Level of assistance: Complete Independence Comments: some limp Lt LE due to injury to Lt foot, just out of walking boot     TODAY'S TREATMENT  09/01/22: Exercises; Standing trunk extension Prone press up Piriformis stretch supine travell 30 sec x 2  Hip flexor stretch modified thomas 30 sec x  2   Trigger Point Dry-Needling   09/01/22: Treatment instructions: Expect mild to moderate muscle soreness. S/S of pneumothorax if dry needled over a lung field, and to seek immediate medical attention should they occur. Patient verbalized understanding of these instructions and education.  Patient Consent Given: Yes Education handout provided: Previously provided Muscles treated: piriformis; glut max/med/min Electrical stimulation performed: Yes Parameters:   mAmp current x 5 min  Treatment response/outcome: decreased palpable tightness noted     Modalities    MH lumbar spine x 10 min   PATIENT EDUCATION:  Education details: POC; HEP; DN Person educated: Patient Education method: Explanation, Demonstration, Tactile cues, Verbal cues, and Handouts Education comprehension: verbalized understanding, returned demonstration, verbal cues required, tactile cues required, and needs further education   HOME EXERCISE PROGRAM:  Access Code: [email or web address] URL: [email or web address] Date: 08/11/2022 Prepared by: Gillermo Murdoch  Exercises - Prone Press Up  - 2 x daily - 7 x weekly - 1 sets - 10 reps - 2-3 sec  hold - Standing Lumbar Extension  - 2 x daily - 7 x weekly - 1 sets - 2-3 reps - 2-3 sec  hold - Supine Piriformis Stretch with Leg Straight  - 2 x daily - 7 x weekly - 1 sets - 3 reps - 30 sec  hold - Hip Flexor Stretch at Edge of Bed  - 2 x daily - 7 x weekly - 1 sets - 3 reps - 30 sec  hold - Supine Transversus Abdominis Bracing with Pelvic Floor Contraction  - 2 x daily - 7 x weekly - 1 sets - 10 reps - 10sec  hold  ASSESSMENT:  09/01/22:  Patient demonstrates muscular tightness in the posterior Rt posterior hip. Good response to DN and treatment. Reviewed exercises performed without difficulty. Encouraged consistent exercises at home.    CLINICAL IMPRESSION: Patient is a 76 y.o. make who was seen today for physical therapy evaluation and treatment for low back pain which has increased in the past month. Patient did have a fall at home in the shower that he feels may have caused LBP. He does have a history of intermittent LBP over the past 30 years following an injury when riding a horse. He presents today with abnormal posture and alignment; limited trunk and LE mobility, ROM, strength; decreased core stability/strength. He has increased difficulty with functional activities and gait. Patient will benefit from PT to address problems  identified.    OBJECTIVE IMPAIRMENTS Abnormal gait, decreased activity tolerance, decreased balance, decreased endurance, decreased mobility, decreased ROM, decreased strength, impaired flexibility, improper body mechanics, postural dysfunction, obesity, and pain.   ACTIVITY LIMITATIONS carrying, lifting, bending, sitting, standing, squatting, sleeping, stairs, transfers, and bed mobility  PARTICIPATION LIMITATIONS: cleaning, laundry, community activity, and yard work  PERSONAL FACTORS Age, Fitness, Past/current experiences, and 1-2 comorbidities: prior history of LBP; obesity  are also affecting patient's functional outcome.   REHAB POTENTIAL: Good  CLINICAL DECISION MAKING: Stable/uncomplicated  EVALUATION COMPLEXITY: Low   GOALS: Goals reviewed with patient? Yes  LONG TERM GOALS: Target date: 10/05/2022  Decrease pain in low back and bilat hips by 50-75% allowing patient to increase functional activity level Baseline:  Goal status: INITIAL  2.  Improve posture and alignment with patient to demonstrate improved upright posture with neutral hip extension  Baseline:  Goal status: INITIAL  3.  Increased LE strength to 4+/5 to 5/5 in areas of weakness  Baseline:  Goal status: INITIAL  4.  Improve gait endurance with patient  to report ability to ambulate community distances without increased pain Baseline:  Goal status: INITIAL  5.  Independent in HEP  Baseline:  Goal status: INITIAL  6.  Improve functional limitation score to 60 Baseline:  Goal status: INITIAL   PLAN: PT FREQUENCY: 2x/week  PT DURATION: 8 weeks  PLANNED INTERVENTIONS: Therapeutic exercises, Therapeutic activity, Neuromuscular re-education, Balance training, Gait training, Patient/Family education, Self Care, Joint mobilization, Aquatic Therapy, Dry Needling, Electrical stimulation, Cryotherapy, and Moist heat.  PLAN FOR NEXT SESSION: continue DN and manual work; review and progress with exercise;  back care and body mechanics education; modalities as indicated.   Everardo All, PT, MPH  09/01/2022, 11:45 AM

## 2022-09-06 ENCOUNTER — Encounter: Payer: Self-pay | Admitting: Rehabilitative and Restorative Service Providers"

## 2022-09-06 ENCOUNTER — Ambulatory Visit: Payer: Medicare Other | Attending: Neurological Surgery | Admitting: Rehabilitative and Restorative Service Providers"

## 2022-09-06 DIAGNOSIS — R293 Abnormal posture: Secondary | ICD-10-CM

## 2022-09-06 DIAGNOSIS — R29898 Other symptoms and signs involving the musculoskeletal system: Secondary | ICD-10-CM

## 2022-09-06 DIAGNOSIS — M545 Low back pain, unspecified: Secondary | ICD-10-CM

## 2022-09-06 DIAGNOSIS — M6281 Muscle weakness (generalized): Secondary | ICD-10-CM

## 2022-09-06 DIAGNOSIS — R2689 Other abnormalities of gait and mobility: Secondary | ICD-10-CM | POA: Diagnosis not present

## 2022-09-06 DIAGNOSIS — R2681 Unsteadiness on feet: Secondary | ICD-10-CM | POA: Diagnosis not present

## 2022-09-06 NOTE — Therapy (Addendum)
OUTPATIENT PHYSICAL THERAPY LUMBAR TREATMENT   Patient Name: Willie Pineda MRN: [id number] DOB:06-Nov-1946, 76 y.o., male Today's Date: 09/06/2022   PT End of Session - 09/06/22 1317     Visit Number 4    Date for PT Re-Evaluation 10/05/22    Authorization Type [employer] Medicare    Authorization - Visit Number 3    Progress Note Due on Visit 10    PT Start Time 4270    PT Stop Time 1400    PT Time Calculation (min) 45 min    Activity Tolerance Patient tolerated treatment well             Past Medical History:  Diagnosis Date   AICD (automatic cardioverter/defibrillator) present    Arthritis    "hands primarily" (05/05/2016)   Atrial fibrillation (HCC)    Back pain    age related   CHF (congestive heart failure) (HCC)    no problemes since 6237   Complication of anesthesia    Bronchial spasms   COPD (chronic obstructive pulmonary disease) (HCC)    inhalers as needed   Coronary artery disease    Depression    takes Zoloft daily   Diabetic peripheral neuropathy ([place]) 01/03/2019   Drug rash 11/16/2021   Dysrhythmia    A Fib-takes Xarelto daily   Enlarged prostate    benign   GERD (gastroesophageal reflux disease)    takes Omeprazole daily   Gout    History of blood clots    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)   Hyperlipidemia    takes Atorvastatin daily   Hypertension    takes Metoprolol,HCTZ,and Losartan daily.Takes Clonidine if needed   Hyperuricemia 07/14/2021   Nocturia    Peripheral neuropathy    takes Gabapentin daily   Peripheral vascular disease (HCC)    legs blockage    Pneumonia ?2015   Presence of permanent cardiac pacemaker    Restless leg    Sleep apnea    uses Bipap   Stroke [facility]) 2012   denies residual on 05/05/2016   Past Surgical History:  Procedure Laterality Date   AMPUTATION Left 04/03/2021   Procedure: LEFT 2ND RAY AMPUTATION;  Surgeon: Newt Minion, MD;  Location: [place];  Service: Orthopedics;  Laterality: Left;   CARPAL  TUNNEL RELEASE     CATARACT EXTRACTION W/ INTRAOCULAR LENS  IMPLANT, BILATERAL Bilateral    COLONOSCOPY WITH PROPOFOL N/A 03/09/2017   Procedure: COLONOSCOPY WITH PROPOFOL;  Surgeon: Wonda Horner, MD;  Location: [facility] ENDOSCOPY;  Service: Endoscopy;  Laterality: N/A;   COLONOSCOPY WITH PROPOFOL N/A 04/03/2020   Procedure: COLONOSCOPY WITH PROPOFOL;  Surgeon: Wonda Horner, MD;  Location: [facility] ENDOSCOPY;  Service: Endoscopy;  Laterality: N/A;   I & D KNEE WITH POLY EXCHANGE Right 05/04/2016   IRRIGATION AND DEBRIDEMENT Right KNEE WITH UNICOMPARTMENTAL POLY EXCHANGE   I & D KNEE WITH POLY EXCHANGE Right 05/04/2016   Procedure: IRRIGATION AND DEBRIDEMENT Right KNEE WITH UNICOMPARTMENTAL POLY EXCHANGE;  Surgeon: Renette Butters, MD;  Location: [place];  Service: Orthopedics;  Laterality: Right;   JOINT REPLACEMENT     LAPAROSCOPIC CHOLECYSTECTOMY  1995   PARTIAL KNEE ARTHROPLASTY Right 04/06/2016   Procedure: UNICOMPARTMENTAL RIGHT KNEE;  Surgeon: Renette Butters, MD;  Location: [place];  Service: Orthopedics;  Laterality: Right;   POLYPECTOMY  04/03/2020   Procedure: POLYPECTOMY;  Surgeon: Wonda Horner, MD;  Location: [facility] ENDOSCOPY;  Service: Endoscopy;;   REVERSE SHOULDER ARTHROPLASTY  Left 04/29/2021   Procedure: Revision antibiotic spacer placement. Incision and drainage.;  Surgeon: [name], MD;  Location: [facility] OR[facility];  Service: Orthopedics;  Laterality: Left;   REVERSE SHOULDER ARTHROPLASTY Left 09/30/2021   Procedure: REVERSE SHOULDER ARTHROPLASTY;  Surgeon: [name], MD;  Location: [facility];  Service: Orthopedics;  Laterality: Left;   STRABISMUS SURGERY Bilateral 06/10/2017   Procedure: REPAIR STRABISMUS BILATERAL;  Surgeon: Everitt Amber, MD;  Location: [place];  Service: Ophthalmology;  Laterality: Bilateral;   TONSILLECTOMY AND ADENOIDECTOMY     TOTAL KNEE ARTHROPLASTY Left 2008   TOTAL SHOULDER ARTHROPLASTY Left 03/04/2021   Procedure: Total Shoulder Arthroplasty;   Surgeon: [name], MD;  Location: [facility];  Service: Orthopedics;  Laterality: Left;   ULNAR NERVE TRANSPOSITION Bilateral 1989   Patient Active Problem List   Diagnosis Date Noted   Charcot foot due to diabetes mellitus ([place]) 11/16/2021   Drug rash 11/16/2021   Status post reverse total replacement of left shoulder 09/30/2021   Hyperuricemia 07/14/2021   Septic joint of left shoulder region ([place]) 04/29/2021   Osteomyelitis of second toe of left foot (HCC)    Penicillin allergy    Chronic osteomyelitis ([place])    Diabetic foot infection ([place])    S/p total knee replacement, bilateral    Septic arthritis of shoulder, left ([place]) 03/03/2021   Atrial fibrillation, chronic ([place]) 03/03/2021   Chronic anticoagulation 03/03/2021   DM (diabetes mellitus), type 2 with neurological complications ([place]) 63/78/5885   Essential hypertension 03/03/2021   Hypokalemia 03/03/2021   Prolonged QT interval 03/03/2021   COPD (chronic obstructive pulmonary disease) ([place]) 03/03/2021   Chronic CHF (congestive heart failure) ([place]) 03/03/2021   Diabetic peripheral neuropathy ([place]) 01/03/2019   Obesity 06/01/2016   Wound dehiscence 05/04/2016   Knee osteoarthritis 04/06/2016    PCP: Dr Josetta Huddle  REFERRING PROVIDER: Dr Sherley Bounds  REFERRING DIAG: Low back pain   Rationale for Evaluation and Treatment Rehabilitation  THERAPY DIAG:  Acute bilateral low back pain without sciatica  Muscle weakness (generalized)  Other abnormalities of gait and mobility  Abnormal posture  Other symptoms and signs involving the musculoskeletal system  Unsteadiness on feet  ONSET DATE: 07/06/22  SUBJECTIVE:                                                                                                                                                                                           SUBJECTIVE STATEMENT:  09/06/22: Back is primarily bothering him on the Rt side with no pain on the Lt side. Pain is come  and go with usually a couple of  days without pain after treatment.  Working on the exercises at home but not been able to do the stretch for the piriformis.    09/01/22: Patient had a cardiac ablation which went well. Patient reports that he had some pain in the Rt > Lt LB - some days better than others. Now out of the walking boot and that has helped not limping so much and lifting the Lt LE. DN really helps. Has not been consistent with exercises due to wife being hospitalized and dealing with health issues in the past 2 weeks.    Eval: Patient reports that he experienced significant increase in LBP following a fall at home. He had increased pain in low back and to the Lt making it difficulty to move,stand form chair and walk.   PERTINENT HISTORY:  History of LBP; partial amputation Lt forefoot; Lt reverse TSA; bilat TKA; pacemaker/defibulator; Lt second toe amputation; HTN; CAD; stroke; AODM; cataract sx; sleep apnea; infection Lt shoulder/knee post joint replacements; tingling Lt hand fingertips, palmar surface following carpal tunnel release and ulnar nerve release ~ 3 years ago tingling continues on an intermittent basis  PAIN:  Are you having pain? Yes: NPRS scale: 3/10 Pain location: low back mid to Lt some spasms  Pain description: sharp Aggravating factors: moving Relieving factors: rest; using cane to walk; meds   PRECAUTIONS: None  WEIGHT BEARING RESTRICTIONS No  FALLS:  Has patient fallen in last 6 months? Yes. Number of falls 1  LIVING ENVIRONMENT: Lives with: lives with their family and lives with their spouse Lives in: House/apartment Stairs: No Has following equipment at home: Single point cane  OCCUPATION: retired Marine scientist  PLOF: Independent  PATIENT GOALS decrease pain in the back and improve functional activities    OBJECTIVE:   DIAGNOSTIC FINDINGS:  X-ray - arthritic changes in the lumbar spine   PATIENT SURVEYS:  FOTO 44  SCREENING FOR RED FLAGS: Bowel  or bladder incontinence: No Spinal tumors: No Cauda equina syndrome: No Compression fracture: No Abdominal aneurysm: No  COGNITION:  Overall cognitive status: Within functional limits for tasks assessed     SENSATION: Numbness and tingling bilat LE's from [place]: Hamstrings: Right 70 deg; Left 65 deg Thomas test: Left > Right  POSTURE: rounded shoulders, forward head, decreased lumbar lordosis, increased thoracic kyphosis, flexed trunk , and weight shift right  PALPATION: Muscular tightness Lt > Rt psoas; bilat lumbar paraspinals, QL, piriformis, glut med, glut min   LUMBAR ROM:   Active  A/PROM  eval  Flexion 70% pulling LB  Extension 30%  Right lateral flexion 70% tight Rt LB  Left lateral flexion 70% tight Lt LB  Right rotation 60%  Left rotation 50%   (Blank rows = not tested)  LOWER EXTREMITY ROM:     Tight end range bilat hips in flexion, extension, rotation bilat   LOWER EXTREMITY MMT:    MMT Right eval Left eval  Hip flexion 5/5 5/5  Hip extension 4/5 4-/5  Hip abduction 4+/5 4/5  Hip adduction    Hip internal rotation    Hip external rotation    Knee flexion    Knee extension    Ankle dorsiflexion    Ankle plantarflexion    Ankle inversion    Ankle eversion     (Blank rows = not tested)  LUMBAR SPECIAL TESTS:    SLR - negative bilat   GAIT: Distance walked: 40 Assistive device utilized: Single point cane Level of assistance:  Complete Independence Comments: some limp Lt LE due to injury to Lt foot, just out of walking boot     TODAY'S TREATMENT  10/2//23: Exercises; Standing trunk extension Prone press up Piriformis stretch supine travell 30 sec x 2  Hip flexor stretch modified thomas 30 sec x 2  Myofacial ball release work standing   Trigger Point Dry-Needling   09/06/22: Treatment instructions: Expect mild to moderate muscle soreness. S/S of pneumothorax if dry needled over a lung field, and to seek immediate  medical attention should they occur. Patient verbalized understanding of these instructions and education.  Patient Consent Given: Yes Education handout provided: Previously provided Muscles treated: piriformis; glut max/med/min Electrical stimulation performed: Yes Parameters:  mAmp current x 5 min  Treatment response/outcome: decreased palpable tightness noted     Modalities    None   PATIENT EDUCATION:  Education details: POC; HEP; DN Person educated: Patient Education method: Explanation, Demonstration, Tactile cues, Verbal cues, and Handouts Education comprehension: verbalized understanding, returned demonstration, verbal cues required, tactile cues required, and needs further education   HOME EXERCISE PROGRAM: Access Code: [email or web address] URL: [email or web address] Date: 09/06/2022 Prepared by: Gillermo Murdoch  Exercises - Prone Press Up  - 2 x daily - 7 x weekly - 1 sets - 10 reps - 2-3 sec  hold - Standing Lumbar Extension  - 2 x daily - 7 x weekly - 1 sets - 2-3 reps - 2-3 sec  hold - Supine Piriformis Stretch with Leg Straight  - 2 x daily - 7 x weekly - 1 sets - 3 reps - 30 sec  hold - Hip Flexor Stretch at Edge of Bed  - 2 x daily - 7 x weekly - 1 sets - 3 reps - 30 sec  hold - Supine Transversus Abdominis Bracing with Pelvic Floor Contraction  - 2 x daily - 7 x weekly - 1 sets - 10 reps - 10sec  hold - Standing Piriformis Release with Ball at Orin  - 2 x daily - 7 x weekly - 30-60 sec  hold   ASSESSMENT:  09/06/22:  Patient has continued muscular tightness in the Rt posterior hip. Little to no pain in the LB. Good response to DN and treatment. Encouraged consistent exercises at home. Some difficulty with stretching for piriformis and psoas. Reviewed today. Added myofacial ball release work in standing working on Rt piriformis and hip abductors.    CLINICAL IMPRESSION: Patient is a 76 y.o. make who was seen today for physical therapy evaluation and treatment for  low back pain which has increased in the past month. Patient did have a fall at home in the shower that he feels may have caused LBP. He does have a history of intermittent LBP over the past 30 years following an injury when riding a horse. He presents today with abnormal posture and alignment; limited trunk and LE mobility, ROM, strength; decreased core stability/strength. He has increased difficulty with functional activities and gait. Patient will benefit from PT to address problems identified.    OBJECTIVE IMPAIRMENTS Abnormal gait, decreased activity tolerance, decreased balance, decreased endurance, decreased mobility, decreased ROM, decreased strength, impaired flexibility, improper body mechanics, postural dysfunction, obesity, and pain.   ACTIVITY LIMITATIONS carrying, lifting, bending, sitting, standing, squatting, sleeping, stairs, transfers, and bed mobility  PARTICIPATION LIMITATIONS: cleaning, laundry, community activity, and yard work  PERSONAL FACTORS Age, Fitness, Past/current experiences, and 1-2 comorbidities: prior history of LBP; obesity  are also affecting patient's functional outcome.   REHAB POTENTIAL: Good  CLINICAL DECISION MAKING: Stable/uncomplicated  EVALUATION COMPLEXITY: Low   GOALS: Goals reviewed with patient? Yes  LONG TERM GOALS: Target date: 10/05/2022  Decrease pain in low back and bilat hips by 50-75% allowing patient to increase functional activity level Baseline:  Goal status: INITIAL  2.  Improve posture and alignment with patient to demonstrate improved upright posture with neutral hip extension  Baseline:  Goal status: INITIAL  3.  Increased LE strength to 4+/5 to 5/5 in areas of weakness  Baseline:  Goal status: INITIAL  4.  Improve gait endurance with patient to report ability to ambulate community distances without increased pain Baseline:  Goal status: INITIAL  5.  Independent in HEP  Baseline:  Goal status: INITIAL  6.   Improve functional limitation score to 60 Baseline:  Goal status: INITIAL   PLAN: PT FREQUENCY: 2x/week  PT DURATION: 8 weeks  PLANNED INTERVENTIONS: Therapeutic exercises, Therapeutic activity, Neuromuscular re-education, Balance training, Gait training, Patient/Family education, Self Care, Joint mobilization, Aquatic Therapy, Dry Needling, Electrical stimulation, Cryotherapy, and Moist heat.  PLAN FOR NEXT SESSION: continue DN and manual work; review and progress with exercise; back care and body mechanics education; modalities as indicated. Add hip abduction straightening    Samanyu Tinnell Nilda Simmer, PT, MPH  09/06/2022, 1:57 PM

## 2022-09-07 DIAGNOSIS — I428 Other cardiomyopathies: Secondary | ICD-10-CM | POA: Diagnosis not present

## 2022-09-07 DIAGNOSIS — I10 Essential (primary) hypertension: Secondary | ICD-10-CM | POA: Diagnosis not present

## 2022-09-07 DIAGNOSIS — I429 Cardiomyopathy, unspecified: Secondary | ICD-10-CM | POA: Diagnosis not present

## 2022-09-07 DIAGNOSIS — I4811 Longstanding persistent atrial fibrillation: Secondary | ICD-10-CM | POA: Diagnosis not present

## 2022-09-07 DIAGNOSIS — I483 Typical atrial flutter: Secondary | ICD-10-CM | POA: Diagnosis not present

## 2022-09-09 ENCOUNTER — Encounter: Payer: Self-pay | Admitting: Rehabilitative and Restorative Service Providers"

## 2022-09-09 ENCOUNTER — Ambulatory Visit: Payer: Medicare Other | Admitting: Rehabilitative and Restorative Service Providers"

## 2022-09-09 DIAGNOSIS — R2681 Unsteadiness on feet: Secondary | ICD-10-CM | POA: Diagnosis not present

## 2022-09-09 DIAGNOSIS — M545 Low back pain, unspecified: Secondary | ICD-10-CM

## 2022-09-09 DIAGNOSIS — R293 Abnormal posture: Secondary | ICD-10-CM

## 2022-09-09 DIAGNOSIS — R29898 Other symptoms and signs involving the musculoskeletal system: Secondary | ICD-10-CM

## 2022-09-09 DIAGNOSIS — R2689 Other abnormalities of gait and mobility: Secondary | ICD-10-CM | POA: Diagnosis not present

## 2022-09-09 DIAGNOSIS — M6281 Muscle weakness (generalized): Secondary | ICD-10-CM | POA: Diagnosis not present

## 2022-09-09 NOTE — Therapy (Signed)
OUTPATIENT PHYSICAL THERAPY LUMBAR TREATMENT   Patient Name: Willie Pineda MRN: [id number] DOB:12-25-1945, 76 y.o., male Today's Date: 09/09/2022   PT End of Session - 09/09/22 1207     Visit Number 5    Number of Visits 16    Date for PT Re-Evaluation 10/05/22    Authorization Type [employer] Medicare    Authorization - Visit Number 5    Progress Note Due on Visit 10    PT Start Time 1140    PT Stop Time 1228    PT Time Calculation (min) 48 min    Activity Tolerance Patient tolerated treatment well             Past Medical History:  Diagnosis Date   AICD (automatic cardioverter/defibrillator) present    Arthritis    "hands primarily" (05/05/2016)   Atrial fibrillation ([place])    Back pain    age related   CHF (congestive heart failure) ([place])    no problemes since 1601   Complication of anesthesia    Bronchial spasms   COPD (chronic obstructive pulmonary disease) (HCC)    inhalers as needed   Coronary artery disease    Depression    takes Zoloft daily   Diabetic peripheral neuropathy ([place]) 01/03/2019   Drug rash 11/16/2021   Dysrhythmia    A Fib-takes Xarelto daily   Enlarged prostate    benign   GERD (gastroesophageal reflux disease)    takes Omeprazole daily   Gout    History of blood clots    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)   Hyperlipidemia    takes Atorvastatin daily   Hypertension    takes Metoprolol,HCTZ,and Losartan daily.Takes Clonidine if needed   Hyperuricemia 07/14/2021   Nocturia    Peripheral neuropathy    takes Gabapentin daily   Peripheral vascular disease (HCC)    legs blockage    Pneumonia ?2015   Presence of permanent cardiac pacemaker    Restless leg    Sleep apnea    uses Bipap   Stroke [facility]) 2012   denies residual on 05/05/2016   Past Surgical History:  Procedure Laterality Date   AMPUTATION Left 04/03/2021   Procedure: LEFT 2ND RAY AMPUTATION;  Surgeon: Newt Minion, MD;  Location: [place];  Service: Orthopedics;   Laterality: Left;   CARPAL TUNNEL RELEASE     CATARACT EXTRACTION W/ INTRAOCULAR LENS  IMPLANT, BILATERAL Bilateral    COLONOSCOPY WITH PROPOFOL N/A 03/09/2017   Procedure: COLONOSCOPY WITH PROPOFOL;  Surgeon: Wonda Horner, MD;  Location[facility] ENDOSCOPY;  Service: Endoscopy;  Laterality: N/A;   COLONOSCOPY WITH PROPOFOL N/A 04/03/2020   Procedure: COLONOSCOPY WITH PROPOFOL;  Surgeon: Wonda Horner, MD;  Location: [facility] ENDOSCOPY;  Service: Endoscopy;  Laterality: N/A;   I & D KNEE WITH POLY EXCHANGE Right 05/04/2016   IRRIGATION AND DEBRIDEMENT Right KNEE WITH UNICOMPARTMENTAL POLY EXCHANGE   I & D KNEE WITH POLY EXCHANGE Right 05/04/2016   Procedure: IRRIGATION AND DEBRIDEMENT Right KNEE WITH UNICOMPARTMENTAL POLY EXCHANGE;  Surgeon: Renette Butters, MD;  Location: [place];  Service: Orthopedics;  Laterality: Right;   JOINT REPLACEMENT     LAPAROSCOPIC CHOLECYSTECTOMY  1995   PARTIAL KNEE ARTHROPLASTY Right 04/06/2016   Procedure: UNICOMPARTMENTAL RIGHT KNEE;  Surgeon: Renette Butters, MD;  Location: [place];  Service: Orthopedics;  Laterality: Right;   POLYPECTOMY  04/03/2020   Procedure: POLYPECTOMY;  Surgeon: Wonda Horner, MD;  Location: [facility] ENDOS[facility];  Service: Endoscopy;;   REVERSE SHOULDER ARTHROPLASTY Left 04/29/2021   Procedure: Revision antibiotic spacer placement. Incision and drainage.;  Surgeon: [name], MD;  Location: [facility];  Service: Orthopedics;  Laterality: Left;   REVERSE SHOULDER ARTHROPLASTY Left 09/30/2021   Procedure: REVERSE SHOULDER ARTHROPLASTY;  Surgeon: [name], MD;  Location: [facility];  Service: Orthopedics;  Laterality: Left;   STRABISMUS SURGERY Bilateral 06/10/2017   Procedure: REPAIR STRABISMUS BILATERAL;  Surgeon: Everitt Amber, MD;  Location: [place];  Service: Ophthalmology;  Laterality: Bilateral;   TONSILLECTOMY AND ADENOIDECTOMY     TOTAL KNEE ARTHROPLASTY Left 2008   TOTAL SHOULDER ARTHROPLASTY Left 03/04/2021   Procedure:  Total Shoulder Arthroplasty;  Surgeon: [name], MD;  Location: [facility];  Service: Orthopedics;  Laterality: Left;   ULNAR NERVE TRANSPOSITION Bilateral 1989   Patient Active Problem List   Diagnosis Date Noted   Charcot foot due to diabetes mellitus ([place]) 11/16/2021   Drug rash 11/16/2021   Status post reverse total replacement of left shoulder 09/30/2021   Hyperuricemia 07/14/2021   Septic joint of left shoulder region ([place]) 04/29/2021   Osteomyelitis of second toe of left foot (HCC)    Penicillin allergy    Chronic osteomyelitis ([place])    Diabetic foot infection ([place])    S/p total knee replacement, bilateral    Septic arthritis of shoulder, left ([place]) 03/03/2021   Atrial fibrillation, chronic ([place]) 03/03/2021   Chronic anticoagulation 03/03/2021   DM (diabetes mellitus), type 2 with neurological complications ([place]) 48/25/0037   Essential hypertension 03/03/2021   Hypokalemia 03/03/2021   Prolonged QT interval 03/03/2021   COPD (chronic obstructive pulmonary disease) ([place]) 03/03/2021   Chronic CHF (congestive heart failure) ([place]) 03/03/2021   Diabetic peripheral neuropathy ([place]) 01/03/2019   Obesity 06/01/2016   Wound dehiscence 05/04/2016   Knee osteoarthritis 04/06/2016    PCP: Dr Josetta Huddle  REFERRING PROVIDER: Dr Sherley Bounds  REFERRING DIAG: Low back pain   Rationale for Evaluation and Treatment Rehabilitation  THERAPY DIAG:  Acute bilateral low back pain without sciatica  Muscle weakness (generalized)  Other abnormalities of gait and mobility  Abnormal posture  Other symptoms and signs involving the musculoskeletal system  ONSET DATE: 07/06/22  SUBJECTIVE:                                                                                                                                                                                           SUBJECTIVE STATEMENT:  09/09/22: Back is not bothering him. He has some soreness in the Rt posterior hip. Pain is  much better. Working on the exercises at  home but has some difficulty with stretch for the piriformis. Thinks he has irritated his knee with that stretch.    09/01/22: Patient had a cardiac ablation which went well. Patient reports that he had some pain in the Rt > Lt LB - some days better than others. Now out of the walking boot and that has helped not limping so much and lifting the Lt LE. DN really helps. Has not been consistent with exercises due to wife being hospitalized and dealing with health issues in the past 2 weeks.    Eval: Patient reports that he experienced significant increase in LBP following a fall at home. He had increased pain in low back and to the Lt making it difficulty to move,stand form chair and walk.   PERTINENT HISTORY:  History of LBP; partial amputation Lt forefoot; Lt reverse TSA; bilat TKA; pacemaker/defibulator; Lt second toe amputation; HTN; CAD; stroke; AODM; cataract sx; sleep apnea; infection Lt shoulder/knee post joint replacements; tingling Lt hand fingertips, palmar surface following carpal tunnel release and ulnar nerve release ~ 3 years ago tingling continues on an intermittent basis  PAIN:  Are you having pain? Yes: NPRS scale: 3/10 Pain location: low back mid to Lt some spasms  Pain description: sharp Aggravating factors: moving Relieving factors: rest; using cane to walk; meds   PRECAUTIONS: None  WEIGHT BEARING RESTRICTIONS No  FALLS:  Has patient fallen in last 6 months? Yes. Number of falls 1  LIVING ENVIRONMENT: Lives with: lives with their family and lives with their spouse Lives in: House/apartment Stairs: No Has following equipment at home: Single point cane  OCCUPATION: retired Marine scientist  PLOF: Independent  PATIENT GOALS decrease pain in the back and improve functional activities    OBJECTIVE:   DIAGNOSTIC FINDINGS:  X-ray - arthritic changes in the lumbar spine   PATIENT SURVEYS:  FOTO 44  SCREENING FOR RED FLAGS: Bowel or  bladder incontinence: No Spinal tumors: No Cauda equina syndrome: No Compression fracture: No Abdominal aneurysm: No  COGNITION:  Overall cognitive status: Within functional limits for tasks assessed     SENSATION: Numbness and tingling bilat LE's from [place]: Hamstrings: Right 70 deg; Left 65 deg Thomas test: Left > Right  POSTURE: rounded shoulders, forward head, decreased lumbar lordosis, increased thoracic kyphosis, flexed trunk , and weight shift right  PALPATION: Muscular tightness Lt > Rt psoas; bilat lumbar paraspinals, QL, piriformis, glut med, glut min   LUMBAR ROM:   Active  A/PROM  eval  Flexion 70% pulling LB  Extension 30%  Right lateral flexion 70% tight Rt LB  Left lateral flexion 70% tight Lt LB  Right rotation 60%  Left rotation 50%   (Blank rows = not tested)  LOWER EXTREMITY ROM:     Tight end range bilat hips in flexion, extension, rotation bilat   LOWER EXTREMITY MMT:    MMT Right eval Left eval  Hip flexion 5/5 5/5  Hip extension 4/5 4-/5  Hip abduction 4+/5 4/5  Hip adduction    Hip internal rotation    Hip external rotation    Knee flexion    Knee extension    Ankle dorsiflexion    Ankle plantarflexion    Ankle inversion    Ankle eversion     (Blank rows = not tested)  LUMBAR SPECIAL TESTS:    SLR - negative bilat   GAIT: Distance walked: 40 Assistive device utilized: Single point cane Level of assistance: Complete Independence Comments: some  limp Lt LE due to injury to Lt foot, just out of walking boot     TODAY'S TREATMENT  10/5//23: Exercises; Nustep L5 x 10 min Standing trunk extension Lateral side steps green TB x 8 ft x 6 each side Prone press up Piriformis stretch supine travell 30 sec x 2  Hip flexor stretch modified thomas 30 sec x 2  Myofacial ball release work standing   Trigger Point Dry-Needling   09/09/22: Treatment instructions: Expect mild to moderate muscle soreness. S/S of  pneumothorax if dry needled over a lung field, and to seek immediate medical attention should they occur. Patient verbalized understanding of these instructions and education.  Patient Consent Given: Yes Education handout provided: Previously provided Muscles treated: piriformis; glut max/med/min Electrical stimulation performed: Yes Parameters:  mAmp current x 5 min  Treatment response/outcome: decreased palpable tightness noted     Modalities    None   PATIENT EDUCATION:  Education details: POC; HEP; DN Person educated: Patient Education method: Explanation, Demonstration, Tactile cues, Verbal cues, and Handouts Education comprehension: verbalized understanding, returned demonstration, verbal cues required, tactile cues required, and needs further education   HOME EXERCISE PROGRAM: Access Code: [email or web address] URL: [email or web address] Date: 09/09/2022 Prepared by: Gillermo Murdoch  Exercises - Prone Press Up  - 2 x daily - 7 x weekly - 1 sets - 10 reps - 2-3 sec  hold - Standing Lumbar Extension  - 2 x daily - 7 x weekly - 1 sets - 2-3 reps - 2-3 sec  hold - Supine Piriformis Stretch with Leg Straight  - 2 x daily - 7 x weekly - 1 sets - 3 reps - 30 sec  hold - Hip Flexor Stretch at Edge of Bed  - 2 x daily - 7 x weekly - 1 sets - 3 reps - 30 sec  hold - Supine Transversus Abdominis Bracing with Pelvic Floor Contraction  - 2 x daily - 7 x weekly - 1 sets - 10 reps - 10sec  hold - Standing Piriformis Release with Ball at [place]  - 2 x daily - 7 x weekly - 30-60 sec  hold - Side Stepping with Resistance at Thighs  - 1 x daily - 7 x weekly   ASSESSMENT:  09/09/22:  Patient has continued muscular tightness in the Rt posterior hip but no pain. Some soreness in the Rt knee from piriformis stretch with strap below knee. Corrected stretch to place strap above knee to avoid irritation to the knee. Continued positive response to DN and manual work. Encouraged consistent exercises at  home. Some difficulty with stretching for piriformis and psoas due to patients physical condition and weight. Continued myofacial ball release work in standing working on Rt piriformis and hip abductors.    CLINICAL IMPRESSION: Patient is a 76 y.o. make who was seen today for physical therapy evaluation and treatment for low back pain which has increased in the past month. Patient did have a fall at home in the shower that he feels may have caused LBP. He does have a history of intermittent LBP over the past 30 years following an injury when riding a horse. He presents today with abnormal posture and alignment; limited trunk and LE mobility, ROM, strength; decreased core stability/strength. He has increased difficulty with functional activities and gait. Patient will benefit from PT to address problems identified.    OBJECTIVE IMPAIRMENTS Abnormal gait, decreased activity tolerance, decreased balance, decreased endurance, decreased mobility, decreased ROM, decreased strength, impaired flexibility, improper body mechanics,  postural dysfunction, obesity, and pain.   ACTIVITY LIMITATIONS carrying, lifting, bending, sitting, standing, squatting, sleeping, stairs, transfers, and bed mobility  PARTICIPATION LIMITATIONS: cleaning, laundry, community activity, and yard work  PERSONAL FACTORS Age, Fitness, Past/current experiences, and 1-2 comorbidities: prior history of LBP; obesity  are also affecting patient's functional outcome.   REHAB POTENTIAL: Good  CLINICAL DECISION MAKING: Stable/uncomplicated  EVALUATION COMPLEXITY: Low   GOALS: Goals reviewed with patient? Yes  LONG TERM GOALS: Target date: 10/05/2022  Decrease pain in low back and bilat hips by 50-75% allowing patient to increase functional activity level Baseline:  Goal status: INITIAL  2.  Improve posture and alignment with patient to demonstrate improved upright posture with neutral hip extension  Baseline:  Goal status:  INITIAL  3.  Increased LE strength to 4+/5 to 5/5 in areas of weakness  Baseline:  Goal status: INITIAL  4.  Improve gait endurance with patient to report ability to ambulate community distances without increased pain Baseline:  Goal status: INITIAL  5.  Independent in HEP  Baseline:  Goal status: INITIAL  6.  Improve functional limitation score to 60 Baseline:  Goal status: INITIAL   PLAN: PT FREQUENCY: 2x/week  PT DURATION: 8 weeks  PLANNED INTERVENTIONS: Therapeutic exercises, Therapeutic activity, Neuromuscular re-education, Balance training, Gait training, Patient/Family education, Self Care, Joint mobilization, Aquatic Therapy, Dry Needling, Electrical stimulation, Cryotherapy, and Moist heat.  PLAN FOR NEXT SESSION: continue DN and manual work; review and progress with exercise; back care and body mechanics education; modalities as indicated. Add hip abduction straightening    Elisse Pennick Nilda Simmer, PT, MPH  09/09/2022, 12:38 PM

## 2022-09-14 ENCOUNTER — Ambulatory Visit: Payer: Medicare Other | Admitting: Rehabilitative and Restorative Service Providers"

## 2022-09-14 ENCOUNTER — Other Ambulatory Visit: Payer: Self-pay | Admitting: Family

## 2022-09-15 DIAGNOSIS — I10 Essential (primary) hypertension: Secondary | ICD-10-CM | POA: Diagnosis not present

## 2022-09-15 DIAGNOSIS — E1142 Type 2 diabetes mellitus with diabetic polyneuropathy: Secondary | ICD-10-CM | POA: Diagnosis not present

## 2022-09-16 ENCOUNTER — Ambulatory Visit: Payer: Medicare Other | Admitting: Rehabilitative and Restorative Service Providers"

## 2022-09-16 ENCOUNTER — Encounter: Payer: Self-pay | Admitting: Rehabilitative and Restorative Service Providers"

## 2022-09-16 DIAGNOSIS — R293 Abnormal posture: Secondary | ICD-10-CM

## 2022-09-16 DIAGNOSIS — M6281 Muscle weakness (generalized): Secondary | ICD-10-CM | POA: Diagnosis not present

## 2022-09-16 DIAGNOSIS — R2689 Other abnormalities of gait and mobility: Secondary | ICD-10-CM

## 2022-09-16 DIAGNOSIS — R2681 Unsteadiness on feet: Secondary | ICD-10-CM | POA: Diagnosis not present

## 2022-09-16 DIAGNOSIS — R29898 Other symptoms and signs involving the musculoskeletal system: Secondary | ICD-10-CM | POA: Diagnosis not present

## 2022-09-16 DIAGNOSIS — M545 Low back pain, unspecified: Secondary | ICD-10-CM

## 2022-09-16 NOTE — Therapy (Signed)
OUTPATIENT PHYSICAL THERAPY LUMBAR TREATMENT   Patient Name: Willie Pineda MRN: [id number] DOB:[birth year], 76 y.o., male Today's Date: 09/16/2022   PT End of Session - 09/16/22 1204     Visit Number 6    Number of Visits 16    Date for PT Re-Evaluation 10/05/22    Authorization Type [employer]    Authorization - Visit Number 6    Progress Note Due on Visit 10    PT Start Time 7829    PT Stop Time 1233    PT Time Calculation (min) 48 min    Activity Tolerance Patient tolerated treatment well    Behavior During Therapy WFL for tasks assessed/performed             Past Medical History:  Diagnosis Date   AICD (automatic cardioverter/defibrillator) present    Arthritis    "hands primarily" (05/05/2016)   Atrial fibrillation (HCC)    Back pain    age related   CHF (congestive heart failure) (HCC)    no problemes since 5621   Complication of anesthesia    Bronchial spasms   COPD (chronic obstructive pulmonary disease) (HCC)    inhalers as needed   Coronary artery disease    Depression    takes Zoloft daily   Diabetic peripheral neuropathy ([place]) 01/03/2019   Drug rash 11/16/2021   Dysrhythmia    A Fib-takes Xarelto daily   Enlarged prostate    benign   GERD (gastroesophageal reflux disease)    takes Omeprazole daily   Gout    History of blood clots    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)   Hyperlipidemia    takes Atorvastatin daily   Hypertension    takes Metoprolol,HCTZ,and Losartan daily.Takes Clonidine if needed   Hyperuricemia 07/14/2021   Nocturia    Peripheral neuropathy    takes Gabapentin daily   Peripheral vascular disease (HCC)    legs blockage    Pneumonia ?2015   Presence of permanent cardiac pacemaker    Restless leg    Sleep apnea    uses Bipap   Stroke [facility]) 2012   denies residual on 05/05/2016   Past Surgical History:  Procedure Laterality Date   AMPUTATION Left 04/03/2021   Procedure: LEFT 2ND RAY AMPUTATION;  Surgeon:  Newt Minion, MD;  Location: [facility];  Service: Orthopedics;  Laterality: Left;   CARPAL TUNNEL RELEASE     CATARACT EXTRACTION W/ INTRAOCULAR LENS  IMPLANT, BILATERAL Bilateral    COLONOSCOPY WITH PROPOFOL N/A 03/09/2017   Procedure: COLONOSCOPY WITH PROPOFOL;  Surgeon: Wonda Horner, MD;  Location: [facility];  Service: Endoscopy;  Laterality: N/A;   COLONOSCOPY WITH PROPOFOL N/A 04/03/2020   Procedure: COLONOSCOPY WITH PROPOFOL;  Surgeon: Wonda Horner, MD;  Location: [facility];  Service: Endoscopy;  Laterality: N/A;   I & D KNEE WITH POLY EXCHANGE Right 05/04/2016   IRRIGATION AND DEBRIDEMENT Right KNEE WITH UNICOMPARTMENTAL POLY EXCHANGE   I & D KNEE WITH POLY EXCHANGE Right 05/04/2016   Procedure: IRRIGATION AND DEBRIDEMENT Right KNEE WITH UNICOMPARTMENTAL POLY EXCHANGE;  Surgeon: Renette Butters, MD;  Location: [facility];  Service: Orthopedics;  Laterality: Right;   JOINT REPLACEMENT     LAPAROSCOPIC CHOLECYSTECTOMY  1995   PARTIAL KNEE ARTHROPLASTY Right 04/06/2016   Procedure: UNICOMPARTMENTAL RIGHT KNEE;  Surgeon: Renette Butters, MD;  Location: [facility];  Service: Orthopedics;  Laterality: Right;   POLYPECTOMY  04/03/2020   Procedure: POLYPECTOMY;  Surgeon: Wonda Horner, MD;  Location: [facility];  Service: Endoscopy;;   REVERSE SHOULDER ARTHROPLASTY Left 04/29/2021   Procedure: Revision antibiotic spacer placement. Incision and drainage.;  Surgeon: [name], MD;  Location: [facility];  Service: Orthopedics;  Laterality: Left;   REVERSE SHOULDER ARTHROPLASTY Left 09/30/2021   Procedure: REVERSE SHOULDER ARTHROPLASTY;  Surgeon: [name], MD;  Location: [facility];  Service: Orthopedics;  Laterality: Left;   STRABISMUS SURGERY Bilateral 06/10/2017   Procedure: REPAIR STRABISMUS BILATERAL;  Surgeon: Everitt Amber, MD;  Location: [facility];  Service: Ophthalmology;  Laterality: Bilateral;   TONSILLECTOMY AND ADENOIDECTOMY     TOTAL KNEE ARTHROPLASTY Left 2008    TOTAL SHOULDER ARTHROPLASTY Left 03/04/2021   Procedure: Total Shoulder Arthroplasty;  Surgeon: [name], MD;  Location: [facility];  Service: Orthopedics;  Laterality: Left;   ULNAR NERVE TRANSPOSITION Bilateral 1989   Patient Active Problem List   Diagnosis Date Noted   Charcot foot due to diabetes mellitus ([place]) 11/16/2021   Drug rash 11/16/2021   Status post reverse total replacement of left shoulder 09/30/2021   Hyperuricemia 07/14/2021   Septic joint of left shoulder region ([place]) 04/29/2021   Osteomyelitis of second toe of left foot (HCC)    Penicillin allergy    Chronic osteomyelitis (HCC)    Diabetic foot infection ([place])    S/p total knee replacement, bilateral    Septic arthritis of shoulder, left ([place]) 03/03/2021   Atrial fibrillation, chronic ([place]) 03/03/2021   Chronic anticoagulation 03/03/2021   DM (diabetes mellitus), type 2 with neurological complications ([place]) 63/87/5643   Essential hypertension 03/03/2021   Hypokalemia 03/03/2021   Prolonged QT interval 03/03/2021   COPD (chronic obstructive pulmonary disease) ([place]) 03/03/2021   Chronic CHF (congestive heart failure) ([place]) 03/03/2021   Diabetic peripheral neuropathy ([place]) 01/03/2019   Obesity 06/01/2016   Wound dehiscence 05/04/2016   Knee osteoarthritis 04/06/2016    PCP: Dr Josetta Huddle  REFERRING PROVIDER: Dr Sherley Bounds  REFERRING DIAG: Low back pain   Rationale for Evaluation and Treatment Rehabilitation  THERAPY DIAG:  Acute bilateral low back pain without sciatica  Muscle weakness (generalized)  Other abnormalities of gait and mobility  Abnormal posture  Other symptoms and signs involving the musculoskeletal system  ONSET DATE: 07/06/22  SUBJECTIVE:                                                                                                                                                                                           SUBJECTIVE STATEMENT:  1012/23: Back pain continues to  improve. He has some tightness and soreness in the  Rt posterior hip. Pain is much minimal. Working on the exercises at home. Will get his custom shoe insert for Lt foot 09/20/22.  09/01/22: Patient had a cardiac ablation which went well. Patient reports that he had some pain in the Rt > Lt LB - some days better than others. Now out of the walking boot and that has helped not limping so much and lifting the Lt LE. DN really helps. Has not been consistent with exercises due to wife being hospitalized and dealing with health issues in the past 2 weeks.    Eval: Patient reports that he experienced significant increase in LBP following a fall at home. He had increased pain in low back and to the Lt making it difficulty to move,stand form chair and walk.   PERTINENT HISTORY:  History of LBP; partial amputation Lt forefoot; Lt reverse TSA; bilat TKA; pacemaker/defibulator; Lt second toe amputation; HTN; CAD; stroke; AODM; cataract sx; sleep apnea; infection Lt shoulder/knee post joint replacements; tingling Lt hand fingertips, palmar surface following carpal tunnel release and ulnar nerve release ~ 3 years ago tingling continues on an intermittent basis  PAIN:  Are you having pain? Yes: NPRS scale: 0/10 Pain location: low back mid to Lt some spasms  Pain description: sharp Aggravating factors: moving Relieving factors: rest; using cane to walk; meds   PRECAUTIONS: None  WEIGHT BEARING RESTRICTIONS No  FALLS:  Has patient fallen in last 6 months? Yes. Number of falls 1  LIVING ENVIRONMENT: Lives with: lives with their family and lives with their spouse Lives in: House/apartment Stairs: No Has following equipment at home: Single point cane  OCCUPATION: retired Marine scientist  PLOF: Independent  PATIENT GOALS decrease pain in the back and improve functional activities    OBJECTIVE:   DIAGNOSTIC FINDINGS:  X-ray - arthritic changes in the lumbar spine   PATIENT SURVEYS:  FOTO 44  SCREENING  FOR RED FLAGS: Bowel or bladder incontinence: No Spinal tumors: No Cauda equina syndrome: No Compression fracture: No Abdominal aneurysm: No  COGNITION:  Overall cognitive status: Within functional limits for tasks assessed     SENSATION: Numbness and tingling bilat LE's from [place]: Hamstrings: Right 70 deg; Left 65 deg Thomas test: Left > Right  POSTURE: rounded shoulders, forward head, decreased lumbar lordosis, increased thoracic kyphosis, flexed trunk , and weight shift right  PALPATION: Muscular tightness Lt > Rt psoas; bilat lumbar paraspinals, QL, piriformis, glut med, glut min   LUMBAR ROM:   Active  A/PROM  eval  Flexion 70% pulling LB  Extension 30%  Right lateral flexion 70% tight Rt LB  Left lateral flexion 70% tight Lt LB  Right rotation 60%  Left rotation 50%   (Blank rows = not tested)  LOWER EXTREMITY ROM:     Tight end range bilat hips in flexion, extension, rotation bilat   LOWER EXTREMITY MMT:    MMT Right eval Left eval  Hip flexion 5/5 5/5  Hip extension 4/5 4-/5  Hip abduction 4+/5 4/5  Hip adduction    Hip internal rotation    Hip external rotation    Knee flexion    Knee extension    Ankle dorsiflexion    Ankle plantarflexion    Ankle inversion    Ankle eversion     (Blank rows = not tested)  LUMBAR SPECIAL TESTS:    SLR - negative bilat   GAIT: Distance walked: 40 Assistive device utilized: Single point cane Level of assistance: Complete Independence  Comments: some limp Lt LE due to injury to Lt foot, just out of walking boot     TODAY'S TREATMENT  09/16/22: Exercises; Nustep L5 x 10 min Sit to stand VC to engage core x 10  Standing trunk extension Lateral side steps green TB x 8 ft x 6 each side Prone press up Piriformis stretch supine travell 30 sec x 2  Hip flexor stretch modified thomas 30 sec x 2  Myofacial ball release work standing   Trigger Point Dry-Needling   09/16/22: Treatment  instructions: Expect mild to moderate muscle soreness. S/S of pneumothorax if dry needled over a lung field, and to seek immediate medical attention should they occur. Patient verbalized understanding of these instructions and education.  Patient Consent Given: Yes Education handout provided: Previously provided Muscles treated: piriformis; glut max/med/min Electrical stimulation performed: Yes Parameters:  mAmp current x 5 min  Treatment response/outcome: decreased palpable tightness noted     Modalities    None   PATIENT EDUCATION:  Education details: POC; HEP; DN Person educated: Patient Education method: Explanation, Demonstration, Tactile cues, Verbal cues, and Handouts Education comprehension: verbalized understanding, returned demonstration, verbal cues required, tactile cues required, and needs further education   HOME EXERCISE PROGRAM: Access Code: [email or web address] URL: [email or web address] Date: 09/09/2022 Prepared by: Gillermo Murdoch  Exercises - Prone Press Up  - 2 x daily - 7 x weekly - 1 sets - 10 reps - 2-3 sec  hold - Standing Lumbar Extension  - 2 x daily - 7 x weekly - 1 sets - 2-3 reps - 2-3 sec  hold - Supine Piriformis Stretch with Leg Straight  - 2 x daily - 7 x weekly - 1 sets - 3 reps - 30 sec  hold - Hip Flexor Stretch at Edge of Bed  - 2 x daily - 7 x weekly - 1 sets - 3 reps - 30 sec  hold - Supine Transversus Abdominis Bracing with Pelvic Floor Contraction  - 2 x daily - 7 x weekly - 1 sets - 10 reps - 10sec  hold - Standing Piriformis Release with Ball at [place]  - 2 x daily - 7 x weekly - 30-60 sec  hold - Side Stepping with Resistance at Thighs  - 1 x daily - 7 x weekly   ASSESSMENT:  09/16/22:  Patient has continued muscular tightness in the Rt posterior hip but no pain. Continued positive response to DN and manual work. Encouraged consistent exercises at home. Some difficulty with stretching for piriformis and psoas due to patients physical  condition and weight. Continued myofacial ball release work in standing working on Rt piriformis and hip abductors.    CLINICAL IMPRESSION: Patient is a 76 y.o. make who was seen today for physical therapy evaluation and treatment for low back pain which has increased in the past month. Patient did have a fall at home in the shower that he feels may have caused LBP. He does have a history of intermittent LBP over the past 30 years following an injury when riding a horse. He presents today with abnormal posture and alignment; limited trunk and LE mobility, ROM, strength; decreased core stability/strength. He has increased difficulty with functional activities and gait. Patient will benefit from PT to address problems identified.    OBJECTIVE IMPAIRMENTS Abnormal gait, decreased activity tolerance, decreased balance, decreased endurance, decreased mobility, decreased ROM, decreased strength, impaired flexibility, improper body mechanics, postural dysfunction, obesity, and pain.   ACTIVITY LIMITATIONS carrying, lifting, bending, sitting, standing,  squatting, sleeping, stairs, transfers, and bed mobility  PARTICIPATION LIMITATIONS: cleaning, laundry, community activity, and yard work  PERSONAL FACTORS Age, Fitness, Past/current experiences, and 1-2 comorbidities: prior history of LBP; obesity  are also affecting patient's functional outcome.   REHAB POTENTIAL: Good  CLINICAL DECISION MAKING: Stable/uncomplicated  EVALUATION COMPLEXITY: Low   GOALS: Goals reviewed with patient? Yes  LONG TERM GOALS: Target date: 10/05/2022  Decrease pain in low back and bilat hips by 50-75% allowing patient to increase functional activity level Baseline:  Goal status: INITIAL  2.  Improve posture and alignment with patient to demonstrate improved upright posture with neutral hip extension  Baseline:  Goal status: INITIAL  3.  Increased LE strength to 4+/5 to 5/5 in areas of weakness  Baseline:  Goal  status: INITIAL  4.  Improve gait endurance with patient to report ability to ambulate community distances without increased pain Baseline:  Goal status: INITIAL  5.  Independent in HEP  Baseline:  Goal status: INITIAL  6.  Improve functional limitation score to 60 Baseline:  Goal status: INITIAL   PLAN: PT FREQUENCY: 2x/week  PT DURATION: 8 weeks  PLANNED INTERVENTIONS: Therapeutic exercises, Therapeutic activity, Neuromuscular re-education, Balance training, Gait training, Patient/Family education, Self Care, Joint mobilization, Aquatic Therapy, Dry Needling, Electrical stimulation, Cryotherapy, and Moist heat.  PLAN FOR NEXT SESSION: continue DN and manual work; review and progress with exercise; back care and body mechanics education; modalities as indicated. Add hip abduction straightening    Amahd Morino Nilda Simmer, PT, MPH  09/16/2022, 12:07 PM

## 2022-09-20 ENCOUNTER — Encounter: Payer: Self-pay | Admitting: Rehabilitative and Restorative Service Providers"

## 2022-09-20 ENCOUNTER — Ambulatory Visit: Payer: Medicare Other | Admitting: Rehabilitative and Restorative Service Providers"

## 2022-09-20 DIAGNOSIS — H02834 Dermatochalasis of left upper eyelid: Secondary | ICD-10-CM | POA: Diagnosis not present

## 2022-09-20 DIAGNOSIS — M545 Low back pain, unspecified: Secondary | ICD-10-CM | POA: Diagnosis not present

## 2022-09-20 DIAGNOSIS — R29898 Other symptoms and signs involving the musculoskeletal system: Secondary | ICD-10-CM

## 2022-09-20 DIAGNOSIS — H04123 Dry eye syndrome of bilateral lacrimal glands: Secondary | ICD-10-CM | POA: Diagnosis not present

## 2022-09-20 DIAGNOSIS — M6281 Muscle weakness (generalized): Secondary | ICD-10-CM

## 2022-09-20 DIAGNOSIS — H401131 Primary open-angle glaucoma, bilateral, mild stage: Secondary | ICD-10-CM | POA: Diagnosis not present

## 2022-09-20 DIAGNOSIS — R2689 Other abnormalities of gait and mobility: Secondary | ICD-10-CM

## 2022-09-20 DIAGNOSIS — H538 Other visual disturbances: Secondary | ICD-10-CM | POA: Diagnosis not present

## 2022-09-20 DIAGNOSIS — R293 Abnormal posture: Secondary | ICD-10-CM

## 2022-09-20 DIAGNOSIS — H02831 Dermatochalasis of right upper eyelid: Secondary | ICD-10-CM | POA: Diagnosis not present

## 2022-09-20 DIAGNOSIS — R2681 Unsteadiness on feet: Secondary | ICD-10-CM | POA: Diagnosis not present

## 2022-09-20 NOTE — Therapy (Addendum)
OUTPATIENT PHYSICAL THERAPY LUMBAR TREATMENT AND DISCHARGE SUMMARY PHYSICAL THERAPY DISCHARGE SUMMARY  Visits from Start of Care: 7  Current functional level related to goals / functional outcomes: See progress note for discharge status   Remaining deficits: Unknown    Education / Equipment: HEP    Patient agrees to discharge. Patient goals were partially met. Patient is being discharged due to being pleased with the current functional level. Willie Pineda P. Helene Kelp PT, MPH 11/24/22 8:26 AM   Patient Name: Willie Pineda MRN: [id number] DOB:1946-05-02, 76 y.o., male Today's Date: 09/20/2022   PT End of Session - 09/20/22 1312     Visit Number 7    Number of Visits 16    Date for PT Re-Evaluation 10/05/22    Authorization Type [employer] Medicare    Authorization - Visit Number 7    Progress Note Due on Visit 10    PT Start Time 2876    PT Stop Time 1600    PT Time Calculation (min) 45 min    Activity Tolerance Patient tolerated treatment well             Past Medical History:  Diagnosis Date   AICD (automatic cardioverter/defibrillator) present    Arthritis    "hands primarily" (05/05/2016)   Atrial fibrillation ([place])    Back pain    age related   CHF (congestive heart failure) ([place])    no problemes since 8115   Complication of anesthesia    Bronchial spasms   COPD (chronic obstructive pulmonary disease) (HCC)    inhalers as needed   Coronary artery disease    Depression    takes Zoloft daily   Diabetic peripheral neuropathy ([place]) 01/03/2019   Drug rash 11/16/2021   Dysrhythmia    A Fib-takes Xarelto daily   Enlarged prostate    benign   GERD (gastroesophageal reflux disease)    takes Omeprazole daily   Gout    History of blood clots    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)   Hyperlipidemia    takes Atorvastatin daily   Hypertension    takes Metoprolol,HCTZ,and Losartan daily.Takes Clonidine if needed   Hyperuricemia 07/14/2021   Nocturia     Peripheral neuropathy    takes Gabapentin daily   Peripheral vascular disease (HCC)    legs blockage    Pneumonia ?2015   Presence of permanent cardiac pacemaker    Restless leg    Sleep apnea    uses Bipap   Stroke [facility]) 2012   denies residual on 05/05/2016   Past Surgical History:  Procedure Laterality Date   AMPUTATION Left 04/03/2021   Procedure: LEFT 2ND RAY AMPUTATION;  Surgeon: Newt Minion, MD;  Location: [place];  Service: Orthopedics;  Laterality: Left;   CARPAL TUNNEL RELEASE     CATARACT EXTRACTION W/ INTRAOCULAR LENS  IMPLANT, BILATERAL Bilateral    COLONOSCOPY WITH PROPOFOL N/A 03/09/2017   Procedure: COLONOSCOPY WITH PROPOFOL;  Surgeon: Wonda Horner, MD;  Location[facility] ENDOSCOPY;  Service: Endoscopy;  Laterality: N/A;   COLONOSCOPY WITH PROPOFOL N/A 04/03/2020   Procedure: COLONOSCOPY WITH PROPOFOL;  Surgeon: Wonda Horner, MD;  Location: [facility] ENDOSCOPY;  Service: Endoscopy;  Laterality: N/A;   I & D KNEE WITH POLY EXCHANGE Right 05/04/2016   IRRIGATION AND DEBRIDEMENT Right KNEE WITH UNICOMPARTMENTAL POLY EXCHANGE   I & D KNEE WITH POLY EXCHANGE Right 05/04/2016   Procedure: IRRIGATION AND DEBRIDEMENT Right KNEE WITH UNICOMPARTMENTAL POLY EXCHANGE;  Surgeon:  Renette Butters, MD;  Location: [place];  Service: Orthopedics;  Laterality: Right;   JOINT REPLACEMENT     LAPAROSCOPIC CHOLECYSTECTOMY  1995   PARTIAL KNEE ARTHROPLASTY Right 04/06/2016   Procedure: UNICOMPARTMENTAL RIGHT KNEE;  Surgeon: Renette Butters, MD;  Location: [place];  Service: Orthopedics;  Laterality: Right;   POLYPECTOMY  04/03/2020   Procedure: POLYPECTOMY;  Surgeon: Wonda Horner, MD;  Location: [facility] ENDOSCOPY;  Service: Endoscopy;;   REVERSE SHOULDER ARTHROPLASTY Left 04/29/2021   Procedure: Revision antibiotic spacer placement. Incision and drainage.;  Surgeon: [name], MD;  Location: [facility];  Service: Orthopedics;  Laterality: Left;   REVERSE SHOULDER ARTHROPLASTY Left 09/30/2021    Procedure: REVERSE SHOULDER ARTHROPLASTY;  Surgeon: [name], MD;  Location: [facility];  Service: Orthopedics;  Laterality: Left;   STRABISMUS SURGERY Bilateral 06/10/2017   Procedure: REPAIR STRABISMUS BILATERAL;  Surgeon: Everitt Amber, MD;  Location: [place];  Service: Ophthalmology;  Laterality: Bilateral;   TONSILLECTOMY AND ADENOIDECTOMY     TOTAL KNEE ARTHROPLASTY Left 2008   TOTAL SHOULDER ARTHROPLASTY Left 03/04/2021   Procedure: Total Shoulder Arthroplasty;  Surgeon: [name], MD;  Location: [facility];  Service: Orthopedics;  Laterality: Left;   ULNAR NERVE TRANSPOSITION Bilateral 1989   Patient Active Problem List   Diagnosis Date Noted   Charcot foot due to diabetes mellitus ([place]) 11/16/2021   Drug rash 11/16/2021   Status post reverse total replacement of left shoulder 09/30/2021   Hyperuricemia 07/14/2021   Septic joint of left shoulder region ([place]) 04/29/2021   Osteomyelitis of second toe of left foot (HCC)    Penicillin allergy    Chronic osteomyelitis (HCC)    Diabetic foot infection ([place])    S/p total knee replacement, bilateral    Septic arthritis of shoulder, left ([place]) 03/03/2021   Atrial fibrillation, chronic ([place]) 03/03/2021   Chronic anticoagulation 03/03/2021   DM (diabetes mellitus), type 2 with neurological complications ([place]) 79/81/0254   Essential hypertension 03/03/2021   Hypokalemia 03/03/2021   Prolonged QT interval 03/03/2021   COPD (chronic obstructive pulmonary disease) ([place]) 03/03/2021   Chronic CHF (congestive heart failure) ([place]) 03/03/2021   Diabetic peripheral neuropathy ([place]) 01/03/2019   Obesity 06/01/2016   Wound dehiscence 05/04/2016   Knee osteoarthritis 04/06/2016    PCP: Dr Josetta Huddle  REFERRING PROVIDER: Dr Sherley Bounds  REFERRING DIAG: Low back pain   Rationale for Evaluation and Treatment Rehabilitation  THERAPY DIAG:  Acute bilateral low back pain without sciatica  Muscle weakness  (generalized)  Other abnormalities of gait and mobility  Abnormal posture  Other symptoms and signs involving the musculoskeletal system  ONSET DATE: 07/06/22  SUBJECTIVE:  SUBJECTIVE STATEMENT:  09/20/22: Back was doing great until today. Not sure what irritated the back. He woke up with back tightness and pain this morning but it resolved lying flat on the bed and stretching the hip (piriformis stretch). He is working on his exercises at home. Did get his insert today and they feel "really good".   09/16/22: Back pain continues to improve. He has some tightness and soreness in the Rt posterior hip. Pain is much minimal. Working on the exercises at home. Will get his custom shoe insert for Lt foot 09/20/22.  Eval: Patient reports that he experienced significant increase in LBP following a fall at home. He had increased pain in low back and to the Lt making it difficulty to move,stand form chair and walk.   PERTINENT HISTORY:  History of LBP; partial amputation Lt forefoot; Lt reverse TSA; bilat TKA; pacemaker/defibulator; Lt second toe amputation; HTN; CAD; stroke; AODM; cataract sx; sleep apnea; infection Lt shoulder/knee post joint replacements; tingling Lt hand fingertips, palmar surface following carpal tunnel release and ulnar nerve release ~ 3 years ago tingling continues on an intermittent basis  PAIN:  Are you having pain? Yes: NPRS scale: 0-1/10 Pain location: low back mid to Lt some spasms  Pain description: dull tightness in Rt posterior hip Aggravating factors: moving Relieving factors: rest; using cane to walk; meds   PRECAUTIONS: None  WEIGHT BEARING RESTRICTIONS No  FALLS:  Has patient fallen in last 6 months? Yes. Number of falls 1  LIVING ENVIRONMENT: Lives with: lives with their  family and lives with their spouse Lives in: House/apartment Stairs: No Has following equipment at home: Single point cane  OCCUPATION: retired Marine scientist  PLOF: Independent  PATIENT GOALS decrease pain in the back and improve functional activities    OBJECTIVE:   DIAGNOSTIC FINDINGS:  X-ray - arthritic changes in the lumbar spine   PATIENT SURVEYS:  FOTO 44  SCREENING FOR RED FLAGS: Bowel or bladder incontinence: No Spinal tumors: No Cauda equina syndrome: No Compression fracture: No Abdominal aneurysm: No  COGNITION:  Overall cognitive status: Within functional limits for tasks assessed     SENSATION: Numbness and tingling bilat LE's from [place]: Hamstrings: Right 70 deg; Left 65 deg Thomas test: Left > Right  POSTURE: rounded shoulders, forward head, decreased lumbar lordosis, increased thoracic kyphosis, flexed trunk , and weight shift right  PALPATION: Muscular tightness Lt > Rt psoas; bilat lumbar paraspinals, QL, piriformis, glut med, glut min   LUMBAR ROM:   Active  A/PROM  eval  Flexion 70% pulling LB  Extension 30%  Right lateral flexion 70% tight Rt LB  Left lateral flexion 70% tight Lt LB  Right rotation 60%  Left rotation 50%   (Blank rows = not tested)  LOWER EXTREMITY ROM:     Tight end range bilat hips in flexion, extension, rotation bilat   LOWER EXTREMITY MMT:    MMT Right eval Left eval  Hip flexion 5/5 5/5  Hip extension 4/5 4-/5  Hip abduction 4+/5 4/5  Hip adduction    Hip internal rotation    Hip external rotation    Knee flexion    Knee extension    Ankle dorsiflexion    Ankle plantarflexion    Ankle inversion    Ankle eversion     (Blank rows = not tested)  LUMBAR SPECIAL TESTS:    SLR - negative bilat   GAIT: Distance walked: 40 Assistive device utilized: Single point cane  Level of assistance: Complete Independence Comments: some limp Lt LE due to injury to Lt foot, just out of walking boot      TODAY'S TREATMENT  09/20/22:  Exercises; Nustep L5 x 10 min Row blue TB bilat x 20 Shoulder extension blue TB bilat x 20  Sit to stand VC to engage core x 10  Standing trunk extension Lateral side steps blue TB x 4 ft each side Antirotation blue one strap x 10 each side  Counter plank 30-60 sec x 3 reps  Prone press up Piriformis stretch supine travell 30 sec x 2    09/16/22: Exercises; Nustep L5 x 10 min Sit to stand VC to engage core x 10  Standing trunk extension Lateral side steps green TB x 8 ft x 6 each side Prone press up Piriformis stretch supine travell 30 sec x 2  Hip flexor stretch modified thomas 30 sec x 2  Myofacial ball release work standing   Trigger Point Dry-Needling   09/20/22: Treatment instructions: Expect mild to moderate muscle soreness. S/S of pneumothorax if dry needled over a lung field, and to seek immediate medical attention should they occur. Patient verbalized understanding of these instructions and education.  Patient Consent Given: Yes Education handout provided: Previously provided Muscles treated: piriformis; glut max/med/min Electrical stimulation performed: Yes Parameters:  mAmp current x 5 min  Treatment response/outcome: decreased palpable tightness noted     Modalities    None   PATIENT EDUCATION:  Education details: POC; HEP; DN Person educated: Patient Education method: Explanation, Demonstration, Tactile cues, Verbal cues, and Handouts Education comprehension: verbalized understanding, returned demonstration, verbal cues required, tactile cues required, and needs further education   HOME EXERCISE PROGRAM:  Access Code: [email or web address] URL: [email or web address] Date: 09/20/2022 Prepared by: Gillermo Murdoch  Exercises - Prone Press Up  - 2 x daily - 7 x weekly - 1 sets - 10 reps - 2-3 sec  hold - Standing Lumbar Extension  - 2 x daily - 7 x weekly - 1 sets - 2-3 reps - 2-3 sec  hold - Supine Piriformis Stretch  with Leg Straight  - 2 x daily - 7 x weekly - 1 sets - 3 reps - 30 sec  hold - Hip Flexor Stretch at Edge of Bed  - 2 x daily - 7 x weekly - 1 sets - 3 reps - 30 sec  hold - Supine Transversus Abdominis Bracing with Pelvic Floor Contraction  - 2 x daily - 7 x weekly - 1 sets - 10 reps - 10sec  hold - Standing Piriformis Release with Ball at [place]  - 2 x daily - 7 x weekly - 30-60 sec  hold - Side Stepping with Resistance at Thighs  - 1 x daily - 7 x weekly - Anti-Rotation Lateral Stepping with Press  - 2 x daily - 7 x weekly - 1-2 sets - 10 reps - 2-3 sec  hold - Plank on Counter  - 2 x daily - 7 x weekly - 1 sets - 3 reps - 30 sec  hold  ASSESSMENT:  09/20/22:  Patient with improving back and Rt posterior hip. He has some flare up of tightness at times likely related to activity of positioning. He has continued muscular tightness in the Rt posterior hip but no pain to palpation. Continued positive response to DN and manual work. Encouraged consistent exercises at home. Added antirotation and counter plank for core stabilization. Some difficulty with stretching for piriformis and psoas due  to patients physical condition and weight. Continued myofacial ball release work in standing working on Rt piriformis and hip abductors for HEP.    CLINICAL IMPRESSION: Patient is a 76 y.o. make who was seen today for physical therapy evaluation and treatment for low back pain which has increased in the past month. Patient did have a fall at home in the shower that he feels may have caused LBP. He does have a history of intermittent LBP over the past 30 years following an injury when riding a horse. He presents today with abnormal posture and alignment; limited trunk and LE mobility, ROM, strength; decreased core stability/strength. He has increased difficulty with functional activities and gait. Patient will benefit from PT to address problems identified.    OBJECTIVE IMPAIRMENTS Abnormal gait, decreased activity  tolerance, decreased balance, decreased endurance, decreased mobility, decreased ROM, decreased strength, impaired flexibility, improper body mechanics, postural dysfunction, obesity, and pain.   ACTIVITY LIMITATIONS carrying, lifting, bending, sitting, standing, squatting, sleeping, stairs, transfers, and bed mobility  PARTICIPATION LIMITATIONS: cleaning, laundry, community activity, and yard work  PERSONAL FACTORS Age, Fitness, Past/current experiences, and 1-2 comorbidities: prior history of LBP; obesity  are also affecting patient's functional outcome.   REHAB POTENTIAL: Good  CLINICAL DECISION MAKING: Stable/uncomplicated  EVALUATION COMPLEXITY: Low   GOALS: Goals reviewed with patient? Yes  LONG TERM GOALS: Target date: 10/05/2022  Decrease pain in low back and bilat hips by 50-75% allowing patient to increase functional activity level Baseline:  Goal status: INITIAL  2.  Improve posture and alignment with patient to demonstrate improved upright posture with neutral hip extension  Baseline:  Goal status: INITIAL  3.  Increased LE strength to 4+/5 to 5/5 in areas of weakness  Baseline:  Goal status: INITIAL  4.  Improve gait endurance with patient to report ability to ambulate community distances without increased pain Baseline:  Goal status: INITIAL  5.  Independent in HEP  Baseline:  Goal status: INITIAL  6.  Improve functional limitation score to 60 Baseline:  Goal status: INITIAL   PLAN: PT FREQUENCY: 2x/week  PT DURATION: 8 weeks  PLANNED INTERVENTIONS: Therapeutic exercises, Therapeutic activity, Neuromuscular re-education, Balance training, Gait training, Patient/Family education, Self Care, Joint mobilization, Aquatic Therapy, Dry Needling, Electrical stimulation, Cryotherapy, and Moist heat.  PLAN FOR NEXT SESSION: continue DN and manual work as needed; review and progress with exercise; back care and body mechanics education; modalities as indicated.  Continue to progress core stabilization and hip abduction straightening    Demont Linford Nilda Simmer, PT, MPH  09/20/2022, 1:49 PM

## 2022-09-23 ENCOUNTER — Ambulatory Visit: Payer: Medicare Other | Admitting: Physical Therapy

## 2022-09-27 NOTE — Therapy (Incomplete)
OUTPATIENT PHYSICAL THERAPY LUMBAR TREATMENT   Patient Name: Willie Pineda MRN: [id number] DOB:August 28, 1946, 76 y.o., male Today's Date: 09/27/2022     Past Medical History:  Diagnosis Date   AICD (automatic cardioverter/defibrillator) present    Arthritis    "hands primarily" (05/05/2016)   Atrial fibrillation (HCC)    Back pain    age related   CHF (congestive heart failure) ([place])    no problemes since 3016   Complication of anesthesia    Bronchial spasms   COPD (chronic obstructive pulmonary disease) (HCC)    inhalers as needed   Coronary artery disease    Depression    takes Zoloft daily   Diabetic peripheral neuropathy ([place]) 01/03/2019   Drug rash 11/16/2021   Dysrhythmia    A Fib-takes Xarelto daily   Enlarged prostate    benign   GERD (gastroesophageal reflux disease)    takes Omeprazole daily   Gout    History of blood clots    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)   Hyperlipidemia    takes Atorvastatin daily   Hypertension    takes Metoprolol,HCTZ,and Losartan daily.Takes Clonidine if needed   Hyperuricemia 07/14/2021   Nocturia    Peripheral neuropathy    takes Gabapentin daily   Peripheral vascular disease (HCC)    legs blockage    Pneumonia ?2015   Presence of permanent cardiac pacemaker    Restless leg    Sleep apnea    uses Bipap   Stroke [facility]) 2012   denies residual on 05/05/2016   Past Surgical History:  Procedure Laterality Date   AMPUTATION Left 04/03/2021   Procedure: LEFT 2ND RAY AMPUTATION;  Surgeon: Newt Minion, MD;  Location: [place];  Service: Orthopedics;  Laterality: Left;   CARPAL TUNNEL RELEASE     CATARACT EXTRACTION W/ INTRAOCULAR LENS  IMPLANT, BILATERAL Bilateral    COLONOSCOPY WITH PROPOFOL N/A 03/09/2017   Procedure: COLONOSCOPY WITH PROPOFOL;  Surgeon: Wonda Horner, MD;  [facility] ENDOSCOPY;  Service: Endoscopy;  Laterality: N/A;   COLONOSCOPY WITH PROPOFOL N/A 04/03/2020   Procedure: COLONOSCOPY WITH  PROPOFOL;  Surgeon: Wonda Horner, MD;  Location: [facility] ENDOSCOPY;  Service: Endoscopy;  Laterality: N/A;   I & D KNEE WITH POLY EXCHANGE Right 05/04/2016   IRRIGATION AND DEBRIDEMENT Right KNEE WITH UNICOMPARTMENTAL POLY EXCHANGE   I & D KNEE WITH POLY EXCHANGE Right 05/04/2016   Procedure: IRRIGATION AND DEBRIDEMENT Right KNEE WITH UNICOMPARTMENTAL POLY EXCHANGE;  Surgeon: Renette Butters, MD;  Location: [place];  Service: Orthopedics;  Laterality: Right;   JOINT REPLACEMENT     LAPAROSCOPIC CHOLECYSTECTOMY  1995   PARTIAL KNEE ARTHROPLASTY Right 04/06/2016   Procedure: UNICOMPARTMENTAL RIGHT KNEE;  Surgeon: Renette Butters, MD;  Location: [place];  Service: Orthopedics;  Laterality: Right;   POLYPECTOMY  04/03/2020   Procedure: POLYPECTOMY;  Surgeon: Wonda Horner, MD;  Location: [facility] ENDOSCOPY;  Service: Endoscopy;;   REVERSE SHOULDER ARTHROPLASTY Left 04/29/2021   Procedure: Revision antibiotic spacer placement. Incision and drainage.;  Surgeon: [name], MD;  Location: [facility];  Service: Orthopedics;  Laterality: Left;   REVERSE SHOULDER ARTHROPLASTY Left 09/30/2021   Procedure: REVERSE SHOULDER ARTHROPLASTY;  Surgeon: [name], MD;  Location: [facility];  Service: Orthopedics;  Laterality: Left;   STRABISMUS SURGERY Bilateral 06/10/2017   Procedure: REPAIR STRABISMUS BILATERAL;  Surgeon: Everitt Amber, MD;  Location: [place];  Service: Ophthalmology;  Laterality: Bilateral;  TONSILLECTOMY AND ADENOIDECTOMY     TOTAL KNEE ARTHROPLASTY Left 2008   TOTAL SHOULDER ARTHROPLASTY Left 03/04/2021   Procedure: Total Shoulder Arthroplasty;  Surgeon: [name], MD;  Location: [facility];  Service: Orthopedics;  Laterality: Left;   ULNAR NERVE TRANSPOSITION Bilateral 1989   Patient Active Problem List   Diagnosis Date Noted   Charcot foot due to diabetes mellitus ([place]) 11/16/2021   Drug rash 11/16/2021   Status post reverse total replacement of left shoulder 09/30/2021    Hyperuricemia 07/14/2021   Septic joint of left shoulder region ([place]) 04/29/2021   Osteomyelitis of second toe of left foot (HCC)    Penicillin allergy    Chronic osteomyelitis (HCC)    Diabetic foot infection ([place])    S/p total knee replacement, bilateral    Septic arthritis of shoulder, left ([place]) 03/03/2021   Atrial fibrillation, chronic ([place]) 03/03/2021   Chronic anticoagulation 03/03/2021   DM (diabetes mellitus), type 2 with neurological complications ([place]) 16/09/9603   Essential hypertension 03/03/2021   Hypokalemia 03/03/2021   Prolonged QT interval 03/03/2021   COPD (chronic obstructive pulmonary disease) ([place]) 03/03/2021   Chronic CHF (congestive heart failure) ([place]) 03/03/2021   Diabetic peripheral neuropathy ([place]) 01/03/2019   Obesity 06/01/2016   Wound dehiscence 05/04/2016   Knee osteoarthritis 04/06/2016    PCP: Dr Josetta Huddle  REFERRING PROVIDER: Dr Sherley Bounds  REFERRING DIAG: Low back pain   Rationale for Evaluation and Treatment Rehabilitation  THERAPY DIAG:  No diagnosis found.  ONSET DATE: 07/06/22  SUBJECTIVE:                                                                                                                                                                                           SUBJECTIVE STATEMENT: 09/28/22: ***   09/20/22: Back was doing great until today. Not sure what irritated the back. He woke up with back tightness and pain this morning but it resolved lying flat on the bed and stretching the hip (piriformis stretch). He is working on his exercises at home. Did get his insert today and they feel "really good".   PERTINENT HISTORY:  History of LBP; partial amputation Lt forefoot; Lt reverse TSA; bilat TKA; pacemaker/defibulator; Lt second toe amputation; HTN; CAD; stroke; AODM; cataract sx; sleep apnea; infection Lt shoulder/knee post joint replacements; tingling Lt hand fingertips, palmar surface following carpal tunnel release and  ulnar nerve release ~ 3 years ago tingling continues on an intermittent basis  PAIN:  *** Are you having pain? Yes: NPRS scale: 0-1/10 Pain location: low back mid to Lt some spasms  Pain description: dull tightness in Rt  posterior hip Aggravating factors: moving Relieving factors: rest; using cane to walk; meds   PRECAUTIONS: None  WEIGHT BEARING RESTRICTIONS No  FALLS:  Has patient fallen in last 6 months? Yes. Number of falls 1  LIVING ENVIRONMENT: Lives with: lives with their family and lives with their spouse Lives in: House/apartment Stairs: No Has following equipment at home: Single point cane  OCCUPATION: retired Marine scientist  PLOF: Independent  PATIENT GOALS decrease pain in the back and improve functional activities    OBJECTIVE:   DIAGNOSTIC FINDINGS:  X-ray - arthritic changes in the lumbar spine   PATIENT SURVEYS:  FOTO 44  SCREENING FOR RED FLAGS: Bowel or bladder incontinence: No Spinal tumors: No Cauda equina syndrome: No Compression fracture: No Abdominal aneurysm: No  COGNITION:  Overall cognitive status: Within functional limits for tasks assessed     SENSATION: Numbness and tingling bilat LE's from [place]: Hamstrings: Right 70 deg; Left 65 deg Thomas test: Left > Right  POSTURE: rounded shoulders, forward head, decreased lumbar lordosis, increased thoracic kyphosis, flexed trunk , and weight shift right  PALPATION: Muscular tightness Lt > Rt psoas; bilat lumbar paraspinals, QL, piriformis, glut med, glut min   LUMBAR ROM:   Active  A/PROM  eval  Flexion 70% pulling LB  Extension 30%  Right lateral flexion 70% tight Rt LB  Left lateral flexion 70% tight Lt LB  Right rotation 60%  Left rotation 50%   (Blank rows = not tested)  LOWER EXTREMITY ROM:     Tight end range bilat hips in flexion, extension, rotation bilat   LOWER EXTREMITY MMT:    MMT Right eval Left eval  Hip flexion 5/5 5/5  Hip extension 4/5 4-/5   Hip abduction 4+/5 4/5  Hip adduction    Hip internal rotation    Hip external rotation    Knee flexion    Knee extension    Ankle dorsiflexion    Ankle plantarflexion    Ankle inversion    Ankle eversion     (Blank rows = not tested)  LUMBAR SPECIAL TESTS:    SLR - negative bilat   GAIT: Distance walked: 40 Assistive device utilized: Single point cane Level of assistance: Complete Independence Comments: some limp Lt LE due to injury to Lt foot, just out of walking boot     TODAY'S TREATMENT  [facility] Adult PT Treatment:                                                DATE: 09/28/22 Therapeutic Exercise: *** Manual Therapy: *** Neuromuscular re-ed: *** Therapeutic Activity: *** Modalities: *** Self Care: ***   09/20/22:  Exercises; Nustep L5 x 10 min Row blue TB bilat x 20 Shoulder extension blue TB bilat x 20  Sit to stand VC to engage core x 10  Standing trunk extension Lateral side steps blue TB x 4 ft each side Antirotation blue one strap x 10 each side  Counter plank 30-60 sec x 3 reps  Prone press up Piriformis stretch supine travell 30 sec x 2    09/16/22: Exercises; Nustep L5 x 10 min Sit to stand VC to engage core x 10  Standing trunk extension Lateral side steps green TB x 8 ft x 6 each side Prone press up Piriformis stretch supine travell 30 sec x 2  Hip flexor stretch modified thomas 30 sec x 2  Myofacial ball release work standing   Trigger Point Dry-Needling   09/20/22: Treatment instructions: Expect mild to moderate muscle soreness. S/S of pneumothorax if dry needled over a lung field, and to seek immediate medical attention should they occur. Patient verbalized understanding of these instructions and education.  Patient Consent Given: Yes Education handout provided: Previously provided Muscles treated: piriformis; glut max/med/min Electrical stimulation performed: Yes Parameters:  mAmp current x 5 min  Treatment response/outcome:  decreased palpable tightness noted     Modalities    None   PATIENT EDUCATION:  Education details: rationale for interventions, PT POC Person educated: Patient Education method: Explanation, Demonstration, Tactile cues, Verbal cues, and Handouts Education comprehension: verbalized understanding, returned demonstration, verbal cues required, tactile cues required, and needs further education   HOME EXERCISE PROGRAM:  Access Code: [email or web address] URL: [email or web address] Date: 09/20/2022 Prepared by: Gillermo Murdoch  Exercises - Prone Press Up  - 2 x daily - 7 x weekly - 1 sets - 10 reps - 2-3 sec  hold - Standing Lumbar Extension  - 2 x daily - 7 x weekly - 1 sets - 2-3 reps - 2-3 sec  hold - Supine Piriformis Stretch with Leg Straight  - 2 x daily - 7 x weekly - 1 sets - 3 reps - 30 sec  hold - Hip Flexor Stretch at Edge of Bed  - 2 x daily - 7 x weekly - 1 sets - 3 reps - 30 sec  hold - Supine Transversus Abdominis Bracing with Pelvic Floor Contraction  - 2 x daily - 7 x weekly - 1 sets - 10 reps - 10sec  hold - Standing Piriformis Release with Ball at [place]  - 2 x daily - 7 x weekly - 30-60 sec  hold - Side Stepping with Resistance at Thighs  - 1 x daily - 7 x weekly - Anti-Rotation Lateral Stepping with Press  - 2 x daily - 7 x weekly - 1-2 sets - 10 reps - 2-3 sec  hold - Plank on Counter  - 2 x daily - 7 x weekly - 1 sets - 3 reps - 30 sec  hold  ASSESSMENT: 09/28/22: ***   09/20/22:  Patient with improving back and Rt posterior hip. He has some flare up of tightness at times likely related to activity of positioning. He has continued muscular tightness in the Rt posterior hip but no pain to palpation. Continued positive response to DN and manual work. Encouraged consistent exercises at home. Added antirotation and counter plank for core stabilization. Some difficulty with stretching for piriformis and psoas due to patients physical condition and weight. Continued  myofacial ball release work in standing working on Rt piriformis and hip abductors for HEP.    CLINICAL IMPRESSION (evaluation): Patient is a 76 y.o. make who was seen today for physical therapy evaluation and treatment for low back pain which has increased in the past month. Patient did have a fall at home in the shower that he feels may have caused LBP. He does have a history of intermittent LBP over the past 30 years following an injury when riding a horse. He presents today with abnormal posture and alignment; limited trunk and LE mobility, ROM, strength; decreased core stability/strength. He has increased difficulty with functional activities and gait. Patient will benefit from PT to address problems identified.    OBJECTIVE IMPAIRMENTS Abnormal gait, decreased activity tolerance, decreased balance, decreased endurance, decreased  mobility, decreased ROM, decreased strength, impaired flexibility, improper body mechanics, postural dysfunction, obesity, and pain.   ACTIVITY LIMITATIONS carrying, lifting, bending, sitting, standing, squatting, sleeping, stairs, transfers, and bed mobility  PARTICIPATION LIMITATIONS: cleaning, laundry, community activity, and yard work  PERSONAL FACTORS Age, Fitness, Past/current experiences, and 1-2 comorbidities: prior history of LBP; obesity  are also affecting patient's functional outcome.   REHAB POTENTIAL: Good  CLINICAL DECISION MAKING: Stable/uncomplicated  EVALUATION COMPLEXITY: Low   GOALS: Goals reviewed with patient? Yes  LONG TERM GOALS: Target date: 10/05/2022  Decrease pain in low back and bilat hips by 50-75% allowing patient to increase functional activity level Baseline:  Goal status: INITIAL  2.  Improve posture and alignment with patient to demonstrate improved upright posture with neutral hip extension  Baseline:  Goal status: INITIAL  3.  Increased LE strength to 4+/5 to 5/5 in areas of weakness  Baseline:  Goal status:  INITIAL  4.  Improve gait endurance with patient to report ability to ambulate community distances without increased pain Baseline:  Goal status: INITIAL  5.  Independent in HEP  Baseline:  Goal status: INITIAL  6.  Improve functional limitation score to 60 Baseline:  Goal status: INITIAL   PLAN: PT FREQUENCY: 2x/week  PT DURATION: 8 weeks  PLANNED INTERVENTIONS: Therapeutic exercises, Therapeutic activity, Neuromuscular re-education, Balance training, Gait training, Patient/Family education, Self Care, Joint mobilization, Aquatic Therapy, Dry Needling, Electrical stimulation, Cryotherapy, and Moist heat.  PLAN FOR NEXT SESSION: *** continue DN and manual work as needed; review and progress with exercise; back care and body mechanics education; modalities as indicated. Continue to progress core stabilization and hip abduction straightening    Leeroy Cha PT, DPT 09/27/2022 9:58 AM

## 2022-09-28 ENCOUNTER — Ambulatory Visit: Payer: Medicare Other | Admitting: Physical Therapy

## 2022-09-28 DIAGNOSIS — M25511 Pain in right shoulder: Secondary | ICD-10-CM | POA: Diagnosis not present

## 2022-09-28 DIAGNOSIS — M19012 Primary osteoarthritis, left shoulder: Secondary | ICD-10-CM | POA: Diagnosis not present

## 2022-09-28 DIAGNOSIS — M542 Cervicalgia: Secondary | ICD-10-CM | POA: Diagnosis not present

## 2022-09-30 ENCOUNTER — Ambulatory Visit: Payer: Medicare Other | Admitting: Physical Therapy

## 2022-10-01 ENCOUNTER — Other Ambulatory Visit: Payer: Self-pay | Admitting: Orthopaedic Surgery

## 2022-10-01 DIAGNOSIS — M50122 Cervical disc disorder at C5-C6 level with radiculopathy: Secondary | ICD-10-CM

## 2022-10-04 ENCOUNTER — Ambulatory Visit
Admission: RE | Admit: 2022-10-04 | Discharge: 2022-10-04 | Disposition: A | Discharge status: Home / Self Care | Payer: Medicare Other | Source: Ambulatory Visit | Attending: Orthopaedic Surgery | Admitting: Orthopaedic Surgery

## 2022-10-04 DIAGNOSIS — M50122 Cervical disc disorder at C5-C6 level with radiculopathy: Secondary | ICD-10-CM

## 2022-10-04 DIAGNOSIS — M542 Cervicalgia: Secondary | ICD-10-CM | POA: Diagnosis not present

## 2022-10-04 DIAGNOSIS — M47812 Spondylosis without myelopathy or radiculopathy, cervical region: Secondary | ICD-10-CM | POA: Diagnosis not present

## 2022-10-18 DIAGNOSIS — R569 Unspecified convulsions: Secondary | ICD-10-CM | POA: Diagnosis not present

## 2022-10-18 DIAGNOSIS — I10 Essential (primary) hypertension: Secondary | ICD-10-CM | POA: Diagnosis not present

## 2022-10-22 DIAGNOSIS — M19012 Primary osteoarthritis, left shoulder: Secondary | ICD-10-CM | POA: Diagnosis not present

## 2022-10-25 DIAGNOSIS — I4811 Longstanding persistent atrial fibrillation: Secondary | ICD-10-CM | POA: Diagnosis not present

## 2022-10-25 DIAGNOSIS — I428 Other cardiomyopathies: Secondary | ICD-10-CM | POA: Diagnosis not present

## 2022-10-25 DIAGNOSIS — Z9989 Dependence on other enabling machines and devices: Secondary | ICD-10-CM | POA: Diagnosis not present

## 2022-10-25 DIAGNOSIS — I10 Essential (primary) hypertension: Secondary | ICD-10-CM | POA: Diagnosis not present

## 2022-10-25 DIAGNOSIS — G4733 Obstructive sleep apnea (adult) (pediatric): Secondary | ICD-10-CM | POA: Diagnosis not present

## 2022-11-09 DIAGNOSIS — M14679 Charcot's joint, unspecified ankle and foot: Secondary | ICD-10-CM | POA: Diagnosis not present

## 2022-11-19 DIAGNOSIS — G4733 Obstructive sleep apnea (adult) (pediatric): Secondary | ICD-10-CM | POA: Diagnosis not present

## 2022-11-24 DIAGNOSIS — M5412 Radiculopathy, cervical region: Secondary | ICD-10-CM | POA: Diagnosis not present

## 2022-12-06 DIAGNOSIS — I428 Other cardiomyopathies: Secondary | ICD-10-CM | POA: Diagnosis not present

## 2022-12-06 DIAGNOSIS — Z95810 Presence of automatic (implantable) cardiac defibrillator: Secondary | ICD-10-CM | POA: Diagnosis not present

## 2022-12-08 DIAGNOSIS — I4811 Longstanding persistent atrial fibrillation: Secondary | ICD-10-CM | POA: Diagnosis not present

## 2022-12-08 DIAGNOSIS — I483 Typical atrial flutter: Secondary | ICD-10-CM | POA: Diagnosis not present

## 2022-12-08 DIAGNOSIS — I10 Essential (primary) hypertension: Secondary | ICD-10-CM | POA: Diagnosis not present

## 2022-12-08 DIAGNOSIS — I428 Other cardiomyopathies: Secondary | ICD-10-CM | POA: Diagnosis not present

## 2022-12-08 DIAGNOSIS — Z95810 Presence of automatic (implantable) cardiac defibrillator: Secondary | ICD-10-CM | POA: Diagnosis not present

## 2022-12-13 DIAGNOSIS — G4733 Obstructive sleep apnea (adult) (pediatric): Secondary | ICD-10-CM | POA: Diagnosis not present

## 2022-12-14 DIAGNOSIS — H6122 Impacted cerumen, left ear: Secondary | ICD-10-CM | POA: Diagnosis not present

## 2022-12-16 DIAGNOSIS — I10 Essential (primary) hypertension: Secondary | ICD-10-CM | POA: Diagnosis not present

## 2022-12-16 DIAGNOSIS — E1142 Type 2 diabetes mellitus with diabetic polyneuropathy: Secondary | ICD-10-CM | POA: Diagnosis not present

## 2022-12-17 DIAGNOSIS — M5412 Radiculopathy, cervical region: Secondary | ICD-10-CM | POA: Diagnosis not present

## 2023-01-10 DIAGNOSIS — M5412 Radiculopathy, cervical region: Secondary | ICD-10-CM | POA: Diagnosis not present

## 2023-01-13 DIAGNOSIS — G4733 Obstructive sleep apnea (adult) (pediatric): Secondary | ICD-10-CM | POA: Diagnosis not present

## 2023-01-17 DIAGNOSIS — I10 Essential (primary) hypertension: Secondary | ICD-10-CM | POA: Diagnosis not present

## 2023-01-17 DIAGNOSIS — F17211 Nicotine dependence, cigarettes, in remission: Secondary | ICD-10-CM | POA: Diagnosis not present

## 2023-01-17 DIAGNOSIS — Z9989 Dependence on other enabling machines and devices: Secondary | ICD-10-CM | POA: Diagnosis not present

## 2023-01-17 DIAGNOSIS — G4733 Obstructive sleep apnea (adult) (pediatric): Secondary | ICD-10-CM | POA: Diagnosis not present

## 2023-01-17 DIAGNOSIS — Z9981 Dependence on supplemental oxygen: Secondary | ICD-10-CM | POA: Diagnosis not present

## 2023-01-18 DIAGNOSIS — M25512 Pain in left shoulder: Secondary | ICD-10-CM | POA: Diagnosis not present

## 2023-01-18 DIAGNOSIS — M009 Pyogenic arthritis, unspecified: Secondary | ICD-10-CM | POA: Diagnosis not present

## 2023-01-24 ENCOUNTER — Other Ambulatory Visit: Payer: Self-pay | Admitting: Orthopaedic Surgery

## 2023-01-24 DIAGNOSIS — T8459XA Infection and inflammatory reaction due to other internal joint prosthesis, initial encounter: Secondary | ICD-10-CM

## 2023-01-24 DIAGNOSIS — Z96619 Presence of unspecified artificial shoulder joint: Secondary | ICD-10-CM

## 2023-01-24 DIAGNOSIS — T84038A Mechanical loosening of other internal prosthetic joint, initial encounter: Secondary | ICD-10-CM

## 2023-01-28 ENCOUNTER — Ambulatory Visit
Admission: RE | Admit: 2023-01-28 | Discharge: 2023-01-28 | Disposition: A | Discharge status: Home / Self Care | Payer: Medicare Other | Source: Ambulatory Visit | Attending: Orthopaedic Surgery | Admitting: Orthopaedic Surgery

## 2023-01-28 DIAGNOSIS — Z96612 Presence of left artificial shoulder joint: Secondary | ICD-10-CM | POA: Diagnosis not present

## 2023-01-28 DIAGNOSIS — T84038A Mechanical loosening of other internal prosthetic joint, initial encounter: Secondary | ICD-10-CM

## 2023-01-28 DIAGNOSIS — Z471 Aftercare following joint replacement surgery: Secondary | ICD-10-CM | POA: Diagnosis not present

## 2023-01-28 DIAGNOSIS — Z96619 Presence of unspecified artificial shoulder joint: Secondary | ICD-10-CM

## 2023-02-01 DIAGNOSIS — H35373 Puckering of macula, bilateral: Secondary | ICD-10-CM | POA: Diagnosis not present

## 2023-02-01 DIAGNOSIS — H526 Other disorders of refraction: Secondary | ICD-10-CM | POA: Diagnosis not present

## 2023-02-01 DIAGNOSIS — H538 Other visual disturbances: Secondary | ICD-10-CM | POA: Diagnosis not present

## 2023-02-01 DIAGNOSIS — H02834 Dermatochalasis of left upper eyelid: Secondary | ICD-10-CM | POA: Diagnosis not present

## 2023-02-01 DIAGNOSIS — H401131 Primary open-angle glaucoma, bilateral, mild stage: Secondary | ICD-10-CM | POA: Diagnosis not present

## 2023-02-01 DIAGNOSIS — H04123 Dry eye syndrome of bilateral lacrimal glands: Secondary | ICD-10-CM | POA: Diagnosis not present

## 2023-02-01 DIAGNOSIS — H02831 Dermatochalasis of right upper eyelid: Secondary | ICD-10-CM | POA: Diagnosis not present

## 2023-02-11 DIAGNOSIS — G4733 Obstructive sleep apnea (adult) (pediatric): Secondary | ICD-10-CM | POA: Diagnosis not present

## 2023-02-14 DIAGNOSIS — M19012 Primary osteoarthritis, left shoulder: Secondary | ICD-10-CM | POA: Diagnosis not present

## 2023-02-14 DIAGNOSIS — Z1159 Encounter for screening for other viral diseases: Secondary | ICD-10-CM | POA: Diagnosis not present

## 2023-02-14 DIAGNOSIS — M25512 Pain in left shoulder: Secondary | ICD-10-CM | POA: Diagnosis not present

## 2023-02-17 DIAGNOSIS — I10 Essential (primary) hypertension: Secondary | ICD-10-CM | POA: Diagnosis not present

## 2023-02-17 DIAGNOSIS — E1142 Type 2 diabetes mellitus with diabetic polyneuropathy: Secondary | ICD-10-CM | POA: Diagnosis not present

## 2023-02-17 DIAGNOSIS — G4733 Obstructive sleep apnea (adult) (pediatric): Secondary | ICD-10-CM | POA: Diagnosis not present

## 2023-03-07 DIAGNOSIS — I429 Cardiomyopathy, unspecified: Secondary | ICD-10-CM | POA: Diagnosis not present

## 2023-03-14 DIAGNOSIS — G4733 Obstructive sleep apnea (adult) (pediatric): Secondary | ICD-10-CM | POA: Diagnosis not present

## 2023-03-29 DIAGNOSIS — R200 Anesthesia of skin: Secondary | ICD-10-CM | POA: Diagnosis not present

## 2023-03-29 DIAGNOSIS — M5412 Radiculopathy, cervical region: Secondary | ICD-10-CM | POA: Diagnosis not present

## 2023-03-30 ENCOUNTER — Other Ambulatory Visit: Payer: Self-pay | Admitting: Neurological Surgery

## 2023-03-30 DIAGNOSIS — M5412 Radiculopathy, cervical region: Secondary | ICD-10-CM

## 2023-03-31 DIAGNOSIS — M14672 Charcot's joint, left ankle and foot: Secondary | ICD-10-CM | POA: Diagnosis not present

## 2023-04-06 NOTE — Discharge Instructions (Signed)

## 2023-04-07 ENCOUNTER — Ambulatory Visit
Admission: RE | Admit: 2023-04-07 | Discharge: 2023-04-07 | Disposition: A | Discharge status: Home / Self Care | Payer: Medicare Other | Source: Ambulatory Visit | Attending: Neurological Surgery | Admitting: Neurological Surgery

## 2023-04-07 DIAGNOSIS — M5412 Radiculopathy, cervical region: Secondary | ICD-10-CM | POA: Diagnosis not present

## 2023-04-07 DIAGNOSIS — R200 Anesthesia of skin: Secondary | ICD-10-CM | POA: Diagnosis not present

## 2023-04-07 DIAGNOSIS — R531 Weakness: Secondary | ICD-10-CM | POA: Diagnosis not present

## 2023-04-07 MED ORDER — DIAZEPAM 5 MG PO TABS: 5.0000 mg | ORAL_TABLET | Freq: Once | ORAL | Status: DC

## 2023-04-07 MED ORDER — MEPERIDINE HCL 50 MG/ML IJ SOLN: 50.0000 mg | Freq: Once | INTRAMUSCULAR | Status: DC | PRN

## 2023-04-07 MED ORDER — IOPAMIDOL (ISOVUE-M 300) INJECTION 61%
10.0000 mL | Freq: Once | INTRAMUSCULAR | Status: AC | PRN
  Administered 2023-04-07: 10 mL via INTRATHECAL

## 2023-04-13 DIAGNOSIS — G4733 Obstructive sleep apnea (adult) (pediatric): Secondary | ICD-10-CM | POA: Diagnosis not present

## 2023-04-25 DIAGNOSIS — I428 Other cardiomyopathies: Secondary | ICD-10-CM | POA: Diagnosis not present

## 2023-04-25 DIAGNOSIS — I10 Essential (primary) hypertension: Secondary | ICD-10-CM | POA: Diagnosis not present

## 2023-04-25 DIAGNOSIS — I4811 Longstanding persistent atrial fibrillation: Secondary | ICD-10-CM | POA: Diagnosis not present

## 2023-04-27 DIAGNOSIS — G5622 Lesion of ulnar nerve, left upper limb: Secondary | ICD-10-CM | POA: Diagnosis not present

## 2023-04-27 DIAGNOSIS — G5603 Carpal tunnel syndrome, bilateral upper limbs: Secondary | ICD-10-CM | POA: Diagnosis not present

## 2023-04-27 DIAGNOSIS — G5621 Lesion of ulnar nerve, right upper limb: Secondary | ICD-10-CM | POA: Diagnosis not present

## 2023-05-03 DIAGNOSIS — G5602 Carpal tunnel syndrome, left upper limb: Secondary | ICD-10-CM | POA: Diagnosis not present

## 2023-05-03 DIAGNOSIS — G5622 Lesion of ulnar nerve, left upper limb: Secondary | ICD-10-CM | POA: Diagnosis not present

## 2023-05-10 DIAGNOSIS — M14672 Charcot's joint, left ankle and foot: Secondary | ICD-10-CM | POA: Diagnosis not present

## 2023-05-12 DIAGNOSIS — H35373 Puckering of macula, bilateral: Secondary | ICD-10-CM | POA: Diagnosis not present

## 2023-05-12 DIAGNOSIS — H401131 Primary open-angle glaucoma, bilateral, mild stage: Secondary | ICD-10-CM | POA: Diagnosis not present

## 2023-05-12 DIAGNOSIS — H538 Other visual disturbances: Secondary | ICD-10-CM | POA: Diagnosis not present

## 2023-05-12 DIAGNOSIS — H40013 Open angle with borderline findings, low risk, bilateral: Secondary | ICD-10-CM | POA: Diagnosis not present

## 2023-05-12 DIAGNOSIS — H40003 Preglaucoma, unspecified, bilateral: Secondary | ICD-10-CM | POA: Diagnosis not present

## 2023-05-14 DIAGNOSIS — G4733 Obstructive sleep apnea (adult) (pediatric): Secondary | ICD-10-CM | POA: Diagnosis not present

## 2023-05-30 DIAGNOSIS — G5603 Carpal tunnel syndrome, bilateral upper limbs: Secondary | ICD-10-CM | POA: Diagnosis not present

## 2023-05-30 DIAGNOSIS — M14679 Charcot's joint, unspecified ankle and foot: Secondary | ICD-10-CM | POA: Diagnosis not present

## 2023-05-30 DIAGNOSIS — R200 Anesthesia of skin: Secondary | ICD-10-CM | POA: Diagnosis not present

## 2023-05-30 DIAGNOSIS — E119 Type 2 diabetes mellitus without complications: Secondary | ICD-10-CM | POA: Diagnosis not present

## 2023-05-30 DIAGNOSIS — M79642 Pain in left hand: Secondary | ICD-10-CM | POA: Diagnosis not present

## 2023-06-06 DIAGNOSIS — I429 Cardiomyopathy, unspecified: Secondary | ICD-10-CM | POA: Diagnosis not present

## 2023-06-06 DIAGNOSIS — Z95810 Presence of automatic (implantable) cardiac defibrillator: Secondary | ICD-10-CM | POA: Diagnosis not present

## 2023-06-08 DIAGNOSIS — I428 Other cardiomyopathies: Secondary | ICD-10-CM | POA: Diagnosis not present

## 2023-06-08 DIAGNOSIS — M159 Polyosteoarthritis, unspecified: Secondary | ICD-10-CM | POA: Diagnosis not present

## 2023-06-08 DIAGNOSIS — Z7901 Long term (current) use of anticoagulants: Secondary | ICD-10-CM | POA: Diagnosis not present

## 2023-06-08 DIAGNOSIS — I483 Typical atrial flutter: Secondary | ICD-10-CM | POA: Diagnosis not present

## 2023-06-08 DIAGNOSIS — E119 Type 2 diabetes mellitus without complications: Secondary | ICD-10-CM | POA: Diagnosis not present

## 2023-06-08 DIAGNOSIS — I10 Essential (primary) hypertension: Secondary | ICD-10-CM | POA: Diagnosis not present

## 2023-06-08 DIAGNOSIS — M109 Gout, unspecified: Secondary | ICD-10-CM | POA: Diagnosis not present

## 2023-06-08 DIAGNOSIS — I5022 Chronic systolic (congestive) heart failure: Secondary | ICD-10-CM | POA: Diagnosis not present

## 2023-06-08 DIAGNOSIS — R569 Unspecified convulsions: Secondary | ICD-10-CM | POA: Diagnosis not present

## 2023-06-08 DIAGNOSIS — E785 Hyperlipidemia, unspecified: Secondary | ICD-10-CM | POA: Diagnosis not present

## 2023-06-13 DIAGNOSIS — G4733 Obstructive sleep apnea (adult) (pediatric): Secondary | ICD-10-CM | POA: Diagnosis not present

## 2023-06-14 ENCOUNTER — Ambulatory Visit: Payer: Medicare Other

## 2023-06-14 ENCOUNTER — Other Ambulatory Visit: Payer: Self-pay

## 2023-06-14 DIAGNOSIS — Z23 Encounter for immunization: Secondary | ICD-10-CM

## 2023-06-20 DIAGNOSIS — G5603 Carpal tunnel syndrome, bilateral upper limbs: Secondary | ICD-10-CM | POA: Diagnosis not present

## 2023-06-20 DIAGNOSIS — E119 Type 2 diabetes mellitus without complications: Secondary | ICD-10-CM | POA: Diagnosis not present

## 2023-06-20 DIAGNOSIS — R200 Anesthesia of skin: Secondary | ICD-10-CM | POA: Diagnosis not present

## 2023-06-24 DIAGNOSIS — I428 Other cardiomyopathies: Secondary | ICD-10-CM | POA: Diagnosis not present

## 2023-06-24 DIAGNOSIS — I10 Essential (primary) hypertension: Secondary | ICD-10-CM | POA: Diagnosis not present

## 2023-06-24 DIAGNOSIS — Z95810 Presence of automatic (implantable) cardiac defibrillator: Secondary | ICD-10-CM | POA: Diagnosis not present

## 2023-08-10 ENCOUNTER — Other Ambulatory Visit: Payer: Self-pay

## 2023-08-10 ENCOUNTER — Ambulatory Visit (INDEPENDENT_AMBULATORY_CARE_PROVIDER_SITE_OTHER): Payer: Medicare Other

## 2023-08-10 DIAGNOSIS — Z23 Encounter for immunization: Secondary | ICD-10-CM

## 2023-09-10 IMAGING — DX DG SHOULDER 1V*L*
1 series · 1 of 1 positions shown · non-contrast
Comparison: 04/29/2021, CT 07/11/2021

CLINICAL DATA: Postop

EXAM:
LEFT SHOULDER

[shoulder obl]
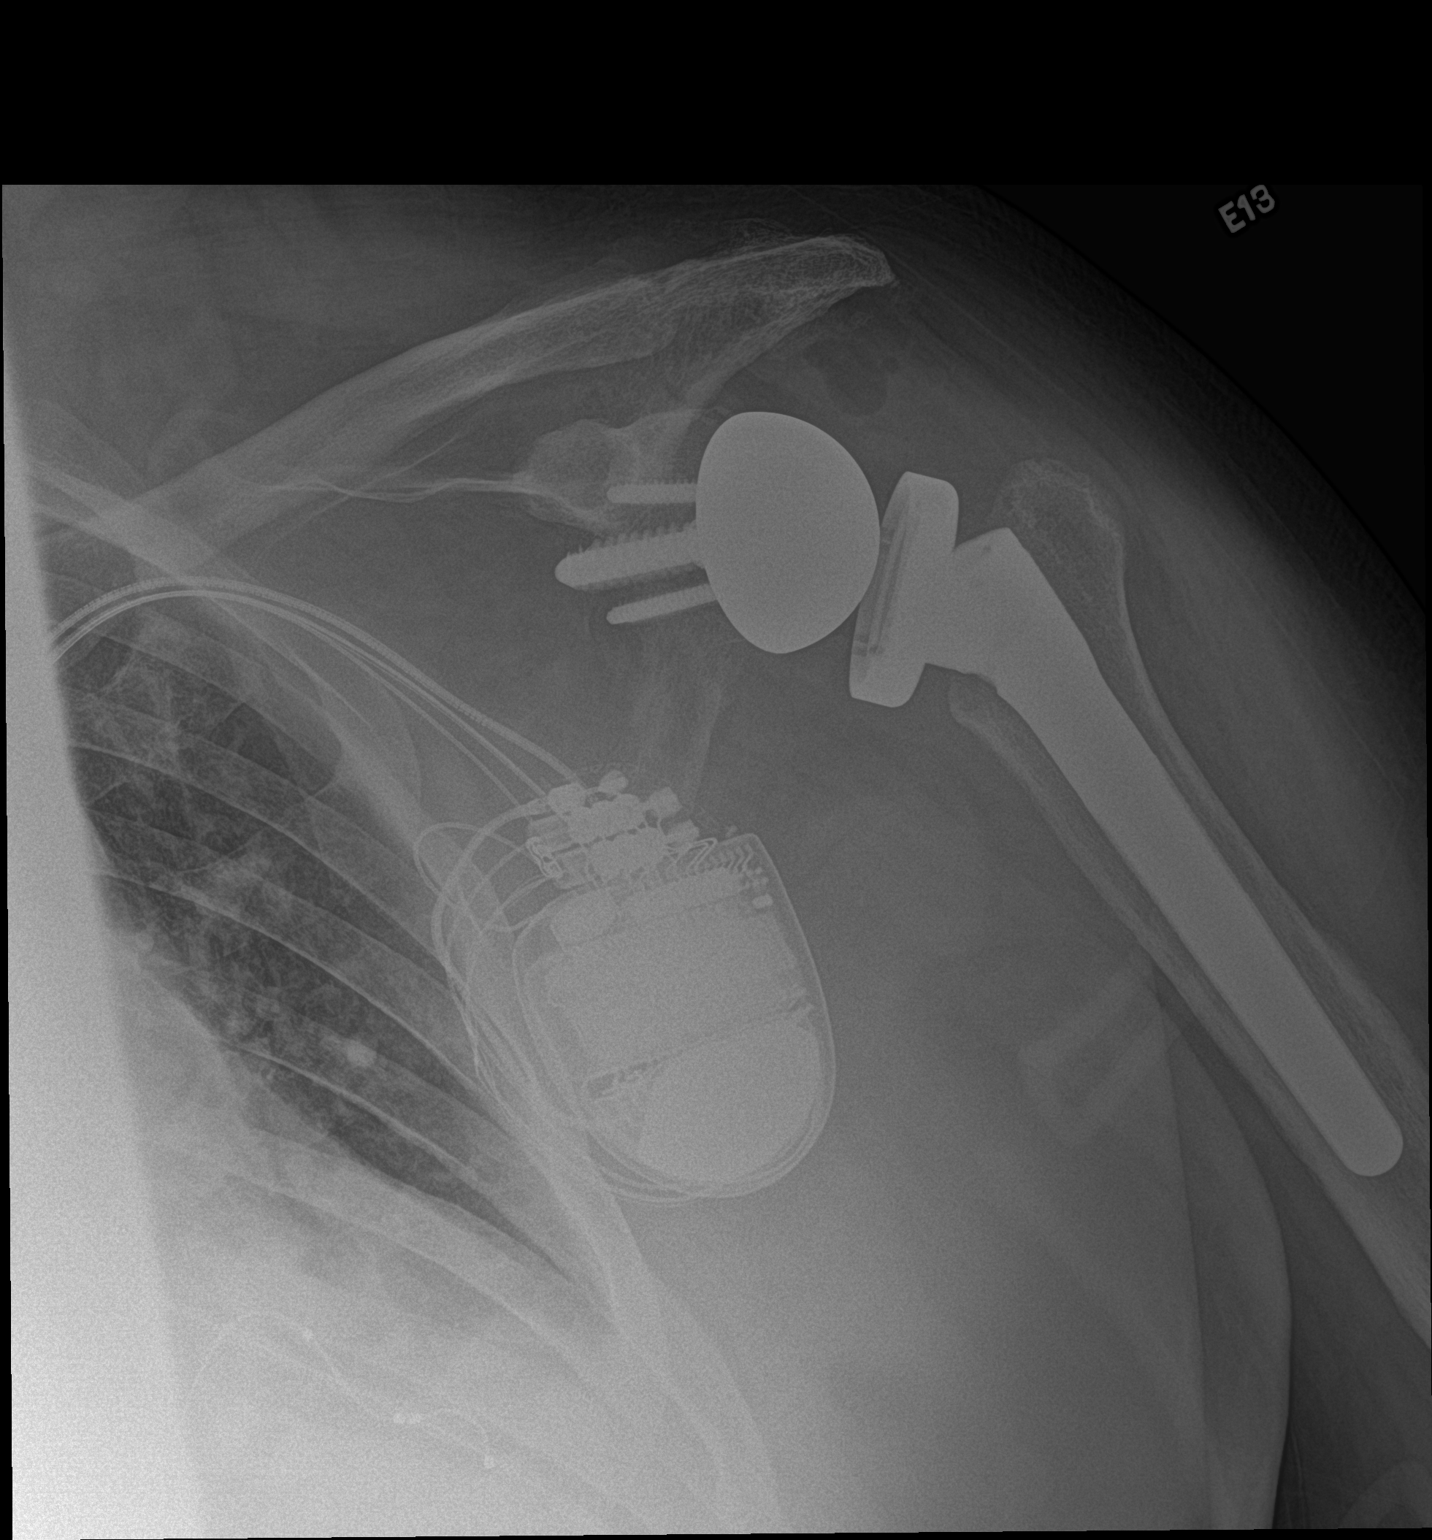

[1 of 1 positions shown; findings below may reference images not displayed]

FINDINGS: Interval left reverse shoulder replacement with intact hardware and
normal alignment. Gas in the soft tissues consistent with recent
surgery. Postsurgical changes at the AC joint
IMPRESSION: Interval reverse left shoulder replacement with expected
postsurgical change

## 2023-10-16 IMAGING — MR MR SHOULDER*R* W/O CM
5 series · 38 of 40 positions shown · non-contrast
Comparison: NONE .

CLINICAL DATA: Right shoulder pain.

EXAM:
MRI OF THE RIGHT SHOULDER WITHOUT CONTRAST
TECHNIQUE: Multiplanar, multisequence MR imaging of the shoulder was performed.
No intravenous contrast was administered.

[Series 6: T2 fat-sat · axial · right · 4.0mm · 0.42mm/px · z∈[-6,+95]mm · 8 of 22 slices shown (1 of 3)]
[im 1/22]
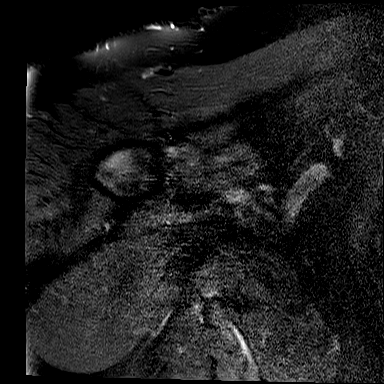
[im 4/22]
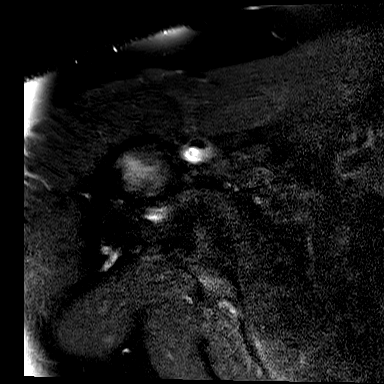
[im 7/22]
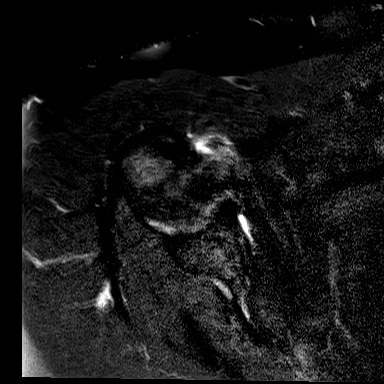
[im 10/22]
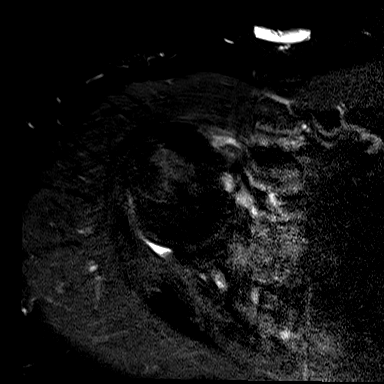
[im 13/22]
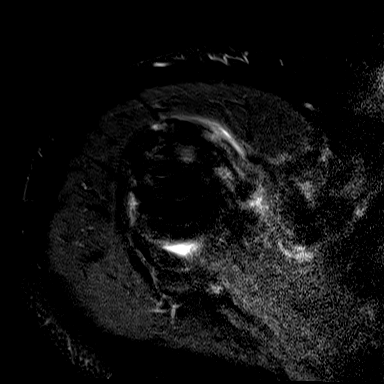
[im 16/22]
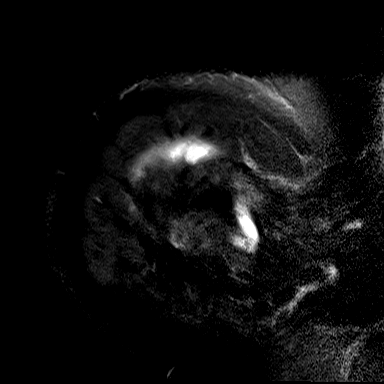
[im 19/22]
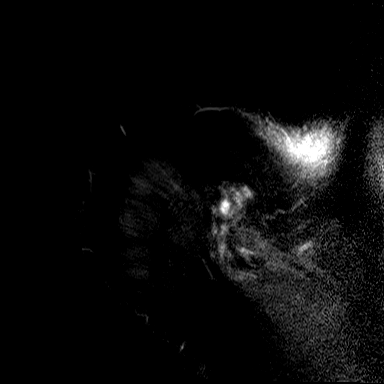
[im 22/22]
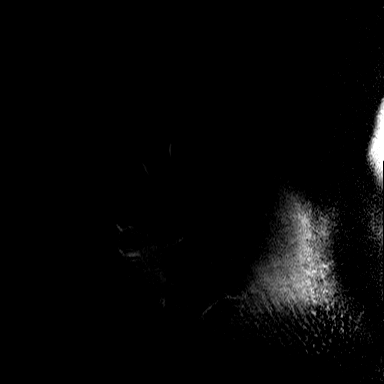

[Series 7: T2 fat-sat · oblique · right · 4.0mm · 0.53mm/px · 8 of 23 slices shown (2 of 3)]
[im 1/23]
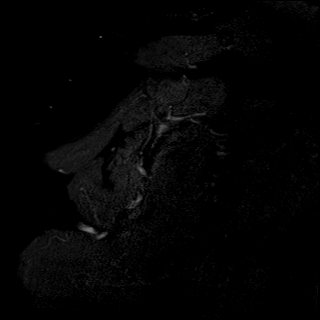
[im 4/23]
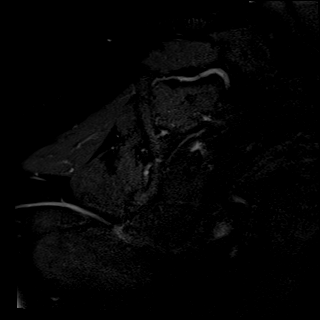
[im 7/23]
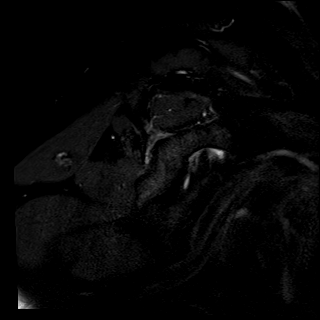
[im 10/23]
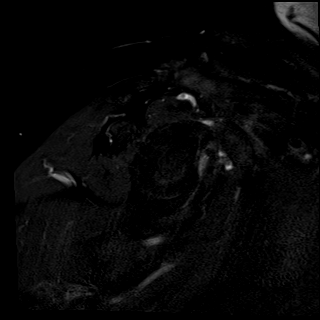
[im 13/23]
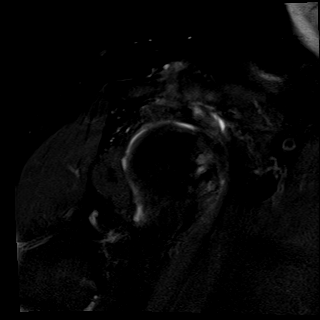
[im 16/23]
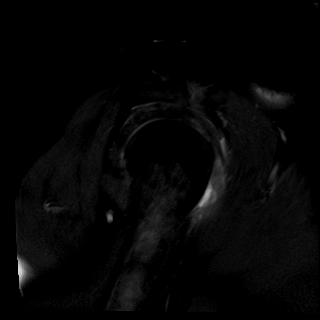
[im 19/23]
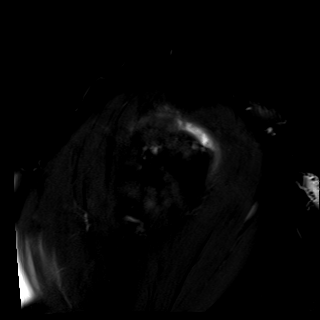
[im 23/23]
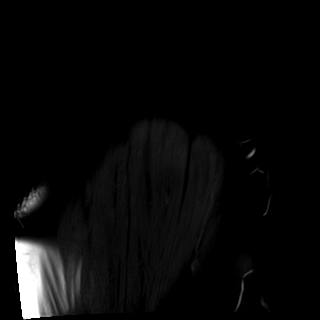

[Series 8: T2 fat-sat · sagittal · right · 4.0mm · 0.53mm/px · 8 of 22 slices shown (3 of 3)]
[im 1/22]
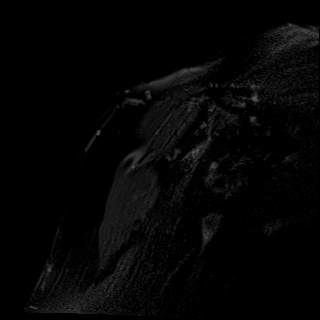
[im 4/22]
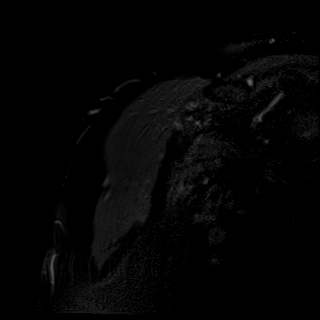
[im 7/22]
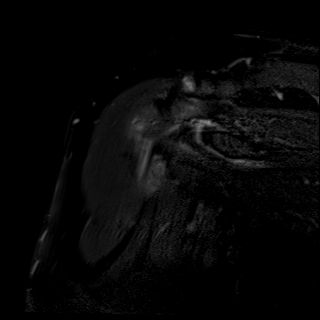
[im 10/22]
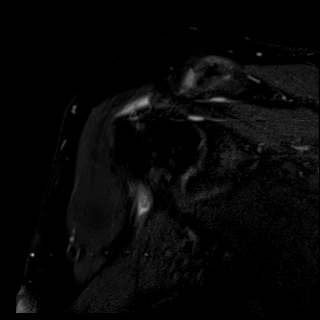
[im 13/22]
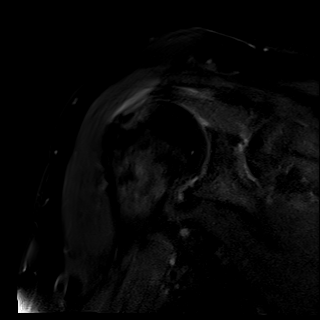
[im 16/22]
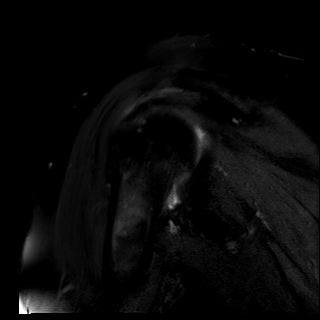
[im 19/22]
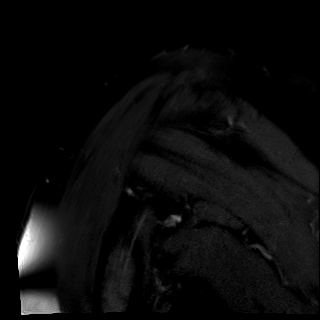
[im 22/22]
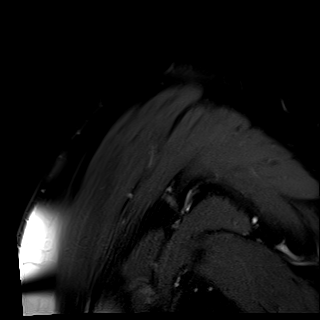

[Series 9: PD fat-sat · oblique · right · 4.0mm · 0.47mm/px · 8 of 22 slices shown]
[im 1/22]
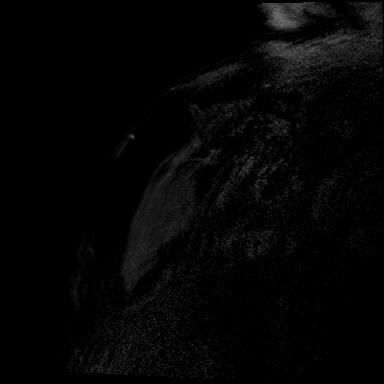
[im 4/22]
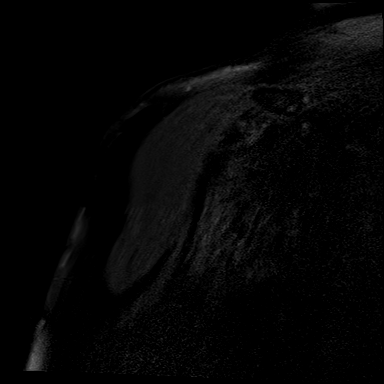
[im 7/22]
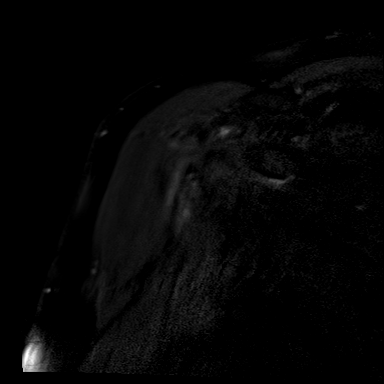
[im 10/22]
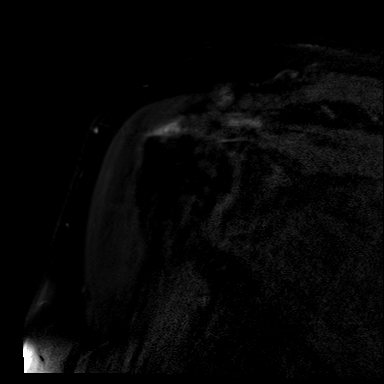
[im 13/22]
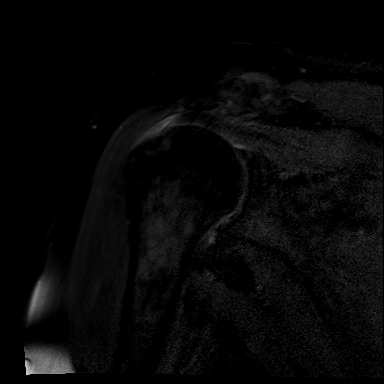
[im 16/22]
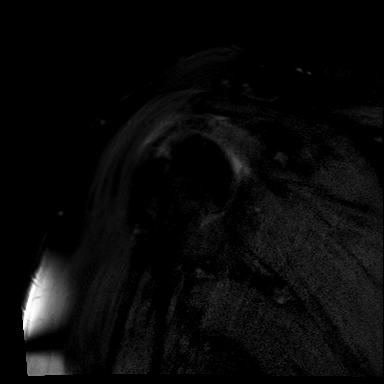
[im 19/22]
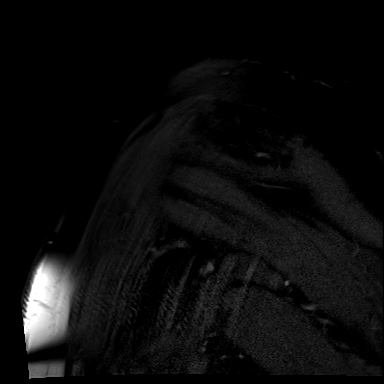
[im 22/22]
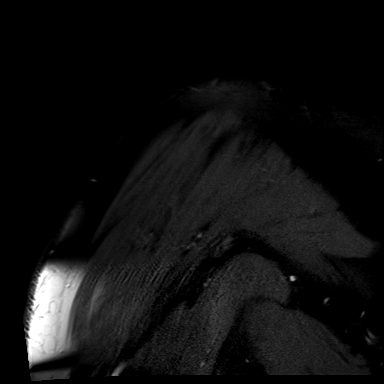

[Series 10: T1 · oblique · right · 4.0mm · 0.36mm/px · 6 of 23 slices shown]
[im 1/23]
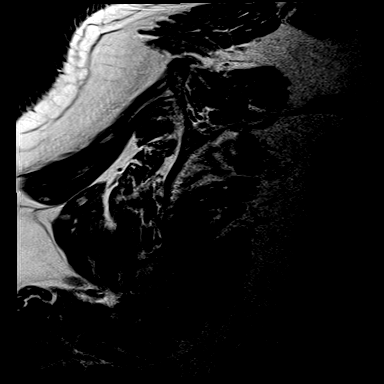
[im 4/23]
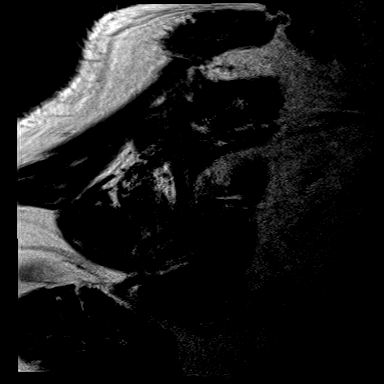
[im 7/23]
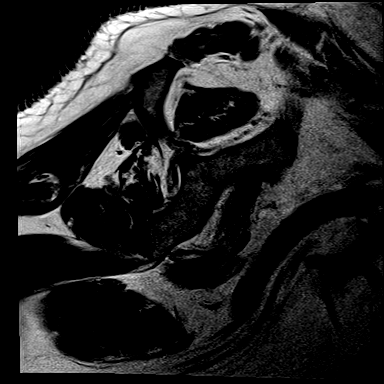
[im 10/23]
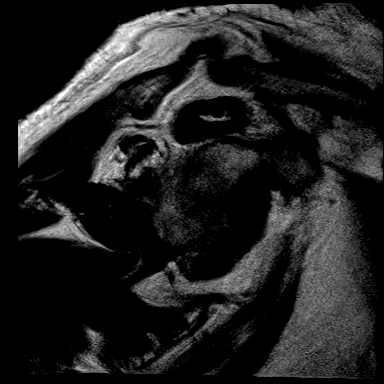
[im 13/23]
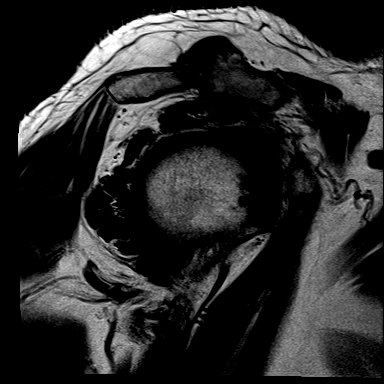
[im 16/23]
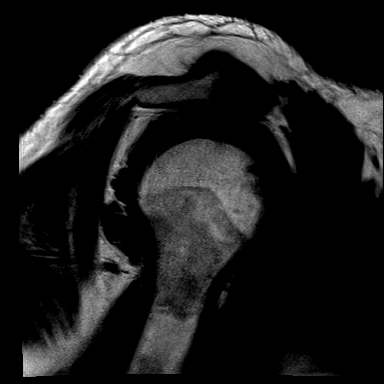

[38 of 40 positions shown; findings below may reference images not displayed]

FINDINGS: Patient motion degrades image quality limiting evaluation.

Rotator cuff: Large full-thickness, near complete, tear of the
supraspinatus tendon with 16 mm of retraction and a few intact
posterior fibers. Moderate tendinosis of the infraspinatus tendon.
Teres minor tendon is intact. Mild tendinosis of the subscapularis
tendon with a partial-thickness tear of the peripheral fibers.

Muscles: No muscle atrophy or edema. No intramuscular fluid
collection or hematoma.

Biceps Long Head: Moderate tendinosis of the proximal
extra-articular portion of the long head of the biceps tendon.

Acromioclavicular Joint: Moderate arthropathy of the
acromioclavicular joint. Trace subacromial/subdeltoid bursal fluid.

Glenohumeral Joint: No joint effusion. No chondral defect.

Labrum: Grossly intact, but evaluation is limited by lack of
intraarticular fluid/contrast.

Bones: No fracture or dislocation. No aggressive osseous lesion.

Other: No fluid collection or hematoma.
IMPRESSION: 1. Large full-thickness, near complete, tear of the supraspinatus
tendon with 16 mm of retraction and a few intact posterior fibers.
2. Moderate tendinosis of the infraspinatus tendon.
3. Mild tendinosis of the subscapularis tendon with a
partial-thickness tear of the peripheral fibers.
4. Moderate tendinosis of the proximal extra-articular portion of
the long head of the biceps tendon.

## 2023-10-16 IMAGING — DX DG CHEST 1V
1 series · 1 of 1 positions shown · non-contrast
Comparison: Chest x-ray 04/29/2021

CLINICAL DATA: Evaluate pacemaker prior to MRI shoulder.

EXAM:
CHEST  1 VIEW

[chest pa]
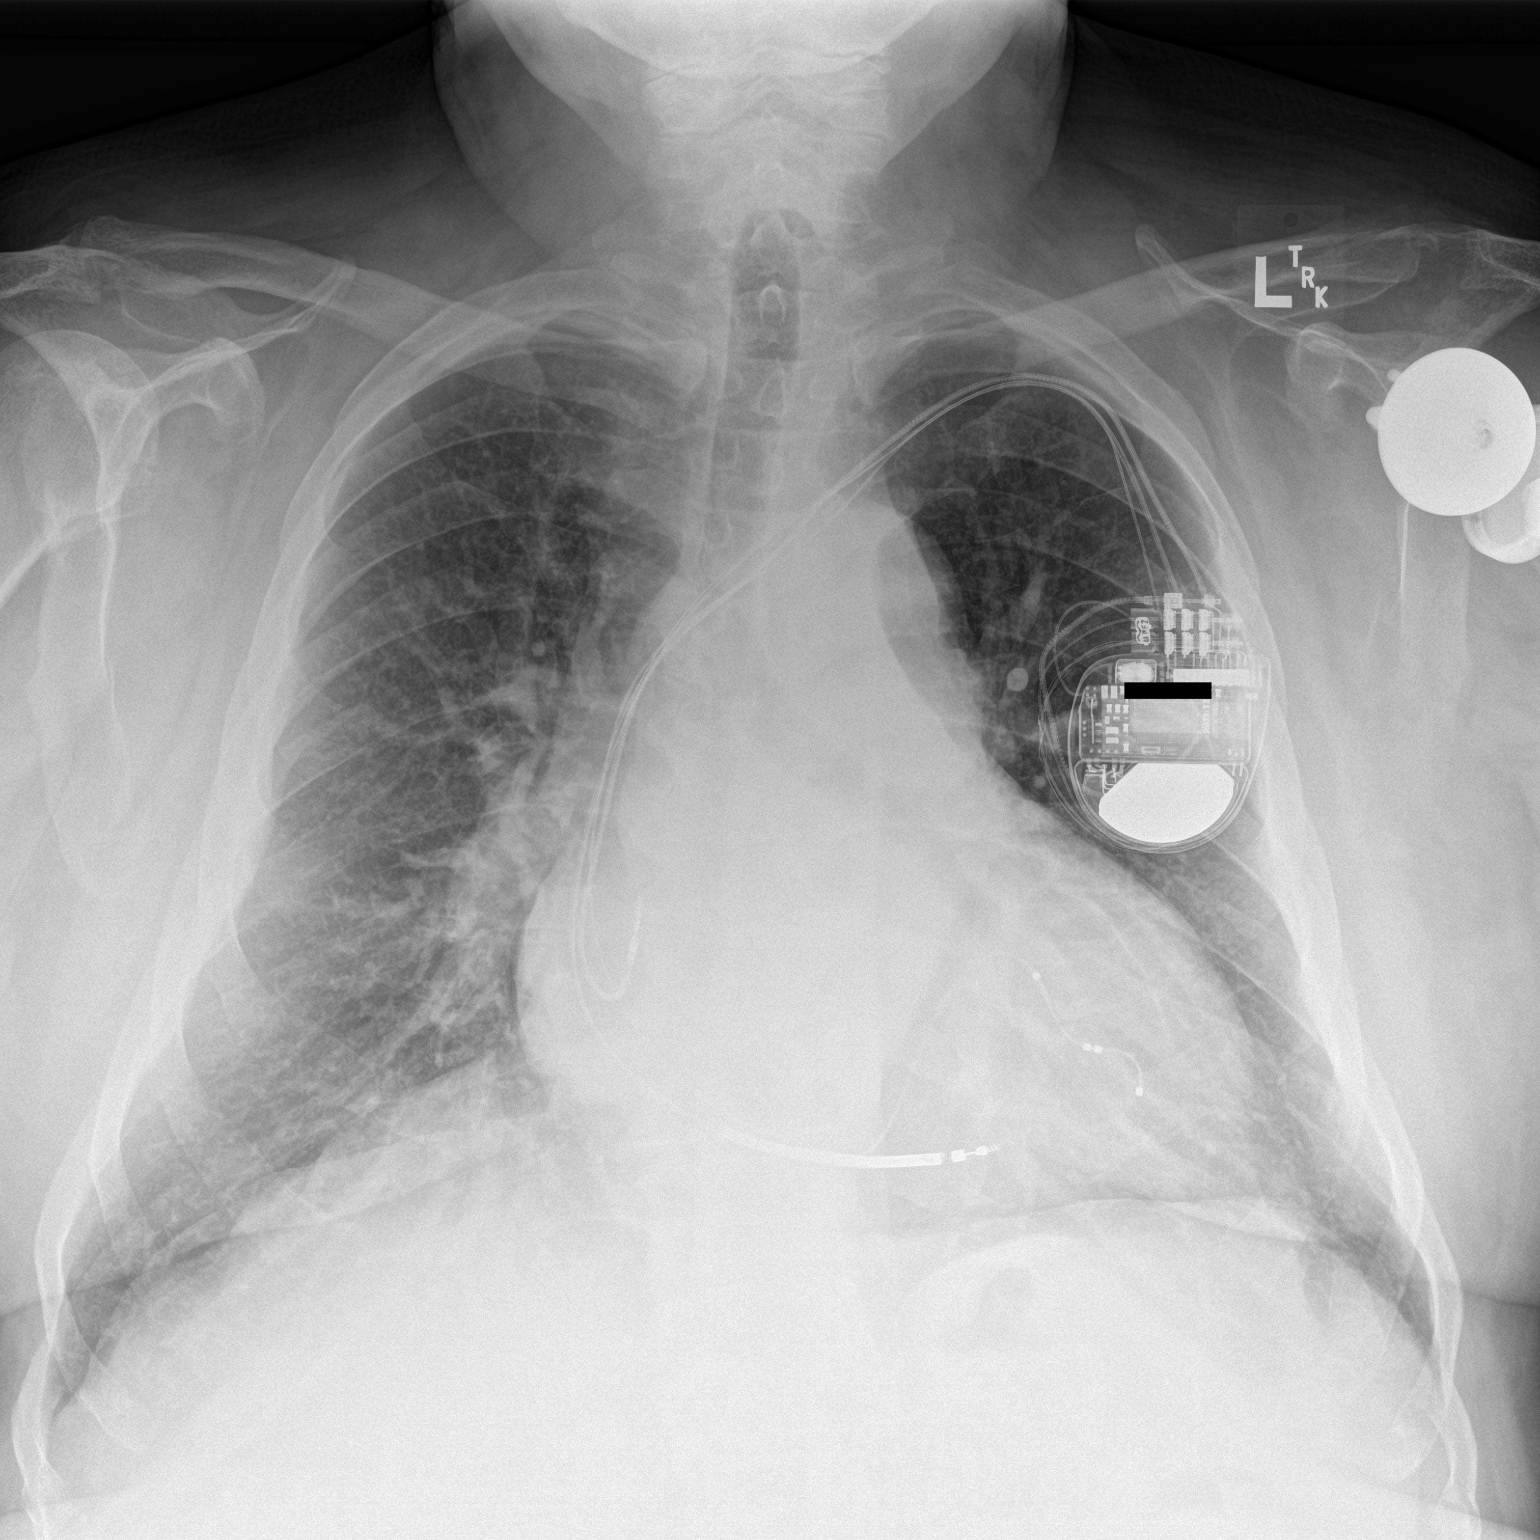

[1 of 1 positions shown; findings below may reference images not displayed]

FINDINGS: There is a permanent left-sided pacemaker with right atrial, right
ventricular and coronary sinus wires. No complicating features.

The heart is enlarged. No acute pulmonary findings. A reversed total
left shoulder arthroplasty is noted.
IMPRESSION: Permanent pacemaker as above.

Cardiac enlargement.

No acute pulmonary findings.

## 2023-12-21 ENCOUNTER — Ambulatory Visit (INDEPENDENT_AMBULATORY_CARE_PROVIDER_SITE_OTHER): Payer: Medicare Other

## 2023-12-21 ENCOUNTER — Other Ambulatory Visit: Payer: Self-pay

## 2023-12-21 DIAGNOSIS — Z23 Encounter for immunization: Secondary | ICD-10-CM | POA: Diagnosis not present

## 2024-05-14 ENCOUNTER — Encounter: Payer: Self-pay | Admitting: Neurology

## 2024-05-14 ENCOUNTER — Ambulatory Visit: Admitting: Neurology

## 2024-05-14 VITALS — BP 103/66 | HR 70 | Ht 72.0 in | Wt 260.0 lb

## 2024-05-14 DIAGNOSIS — E11621 Type 2 diabetes mellitus with foot ulcer: Secondary | ICD-10-CM

## 2024-05-14 DIAGNOSIS — R2681 Unsteadiness on feet: Secondary | ICD-10-CM

## 2024-05-14 DIAGNOSIS — E1142 Type 2 diabetes mellitus with diabetic polyneuropathy: Secondary | ICD-10-CM | POA: Diagnosis not present

## 2024-05-14 DIAGNOSIS — L97409 Non-pressure chronic ulcer of unspecified heel and midfoot with unspecified severity: Secondary | ICD-10-CM | POA: Diagnosis not present

## 2024-05-14 NOTE — Progress Notes (Signed)
 [facility] Neurology Division Clinic Note - Initial Visit   Date: 05/14/2024   Willie Pineda MRN: [id number] DOB: 30-Aug-1946   Dear Dr. Cherl Corner:  Thank you for your kind referral of Willie Pineda for consultation of neuropathy. Although his history is well known to you, please allow us  to reiterate it for the purpose of our medical record. The patient was accompanied to the clinic by wife who also provides collateral information.     Willie Pineda is a 78 y.o. right-handed male with atrial fibrillation, nonischemic cardiomyopathy s/p AICD, brainstem stroke (2012, no residual symptoms) presenting for evaluation of neuropathy.   IMPRESSION/PLAN: Diabetic neuropathy affecting the feet and lower legs manifesting with distal numbness and imbalance.  His imbalance is also contributed by left charcot arthropathy.    - Check vitamin B12, folate, TSH, SPEP with IFE  - Start using a rollator for balance  - Recommend PT for balance, when patient is able  - Management is supportive  Diabetic foot ulceration involving the midfoot of both feet, worse on the left.  Patient and wife were unaware of this.  There is what appears to look like dry gangrene involving both ulcerations.   - Urgent follow-up with Dr. Cherl Corner  - Stressed importance of daily foot exam  Return to clinic as needed  ------------------------------------------------------------- History of present illness: He reports having numbness in the left > right feet which has been present for the past few years.  He has Charcot arthropathy of the left foot and has tried a number of boots and orthotics.  He has imbalance which is much worse on uneven ground and had fallen 3 times this year.  He uses a single prong cane for balance.   He also has tingling involving the hands.  He had history of left CTS release and ulnar nerve decompression.   Out-side paper records, electronic medical record, and images have been  reviewed where available and summarized as:  CT myelogram 04/07/2023: 1. Advanced cervical disc and facet degeneration with up to mild spinal stenosis at C3-4 and C4-5. 2. Moderate to severe neural foraminal stenosis on the right at C2-3 and C4-5, on the left at C5-6, and bilaterally at C3-4. 3. Moderate right neural foraminal stenosis at C7-T1 and T1-2.    Lab Results  Component Value Date   HGBA1C 6.5 (H) 08/20/2021   Lab Results  Component Value Date   VITAMINB12 891 01/03/2019   No results found for: "TSH" Lab Results  Component Value Date   ESRSEDRATE 31 (H) 11/16/2021    Past Medical History:  Diagnosis Date   AICD (automatic cardioverter/defibrillator) present    Arthritis    "hands primarily" (05/05/2016)   Atrial fibrillation (HCC)    Back pain    age related   CHF (congestive heart failure) (HCC)    no problemes since 2018   Complication of anesthesia    Bronchial spasms   COPD (chronic obstructive pulmonary disease) (HCC)    inhalers as needed   Coronary artery disease    Depression    takes Zoloft  daily   Diabetic peripheral neuropathy (HCC) 01/03/2019   Drug rash 11/16/2021   Dysrhythmia    A Fib-takes Xarelto  daily   Enlarged prostate    benign   GERD (gastroesophageal reflux disease)    takes Omeprazole daily   Gout    History of blood clots    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)   Hyperlipidemia  takes Atorvastatin  daily   Hypertension    takes Metoprolol ,HCTZ,and Losartan  daily.Takes Clonidine  if needed   Hyperuricemia 07/14/2021   Nocturia    Peripheral neuropathy    takes Gabapentin  daily   Peripheral vascular disease (HCC)    legs blockage    Pneumonia ?2015   Presence of permanent cardiac pacemaker    Restless leg    Sleep apnea    uses Bipap   Stroke [facility]) 2012   denies residual on 05/05/2016    Past Surgical History:  Procedure Laterality Date   AMPUTATION Left 04/03/2021   Procedure: LEFT 2ND RAY AMPUTATION;   Surgeon: Timothy Ford, MD;  Location: [facility] OR;  Service: Orthopedics;  Laterality: Left;   CARPAL TUNNEL RELEASE     CATARACT EXTRACTION W/ INTRAOCULAR LENS  IMPLANT, BILATERAL Bilateral    COLONOSCOPY WITH PROPOFOL  N/A 03/09/2017   Procedure: COLONOSCOPY WITH PROPOFOL ;  Surgeon: Celedonio Coil, MD;  Location: [facility] ENDOSCOPY;  Service: Endoscopy;  Laterality: N/A;   COLONOSCOPY WITH PROPOFOL  N/A 04/03/2020   Procedure: COLONOSCOPY WITH PROPOFOL ;  Surgeon: Celedonio Coil, MD;  Location: [facility] ENDOSCOPY;  Service: Endoscopy;  Laterality: N/A;   I & D KNEE WITH POLY EXCHANGE Right 05/04/2016   IRRIGATION AND DEBRIDEMENT Right KNEE WITH UNICOMPARTMENTAL POLY EXCHANGE   I & D KNEE WITH POLY EXCHANGE Right 05/04/2016   Procedure: IRRIGATION AND DEBRIDEMENT Right KNEE WITH UNICOMPARTMENTAL POLY EXCHANGE;  Surgeon: Saundra Curl, MD;  Location: [facility] OR;  Service: Orthopedics;  Laterality: Right;   JOINT REPLACEMENT     LAPAROSCOPIC CHOLECYSTECTOMY  1995   PARTIAL KNEE ARTHROPLASTY Right 04/06/2016   Procedure: UNICOMPARTMENTAL RIGHT KNEE;  Surgeon: Saundra Curl, MD;  Location: [facility] OR;  Service: Orthopedics;  Laterality: Right;   POLYPECTOMY  04/03/2020   Procedure: POLYPECTOMY;  Surgeon: Celedonio Coil, MD;  Location: [facility] ENDOSCOPY;  Service: Endoscopy;;   REVERSE SHOULDER ARTHROPLASTY Left 04/29/2021   Procedure: Revision antibiotic spacer placement. Incision and drainage.;  Surgeon: Micheline Ahr, MD;  Location: [facility] ORS;  Service: Orthopedics;  Laterality: Left;   REVERSE SHOULDER ARTHROPLASTY Left 09/30/2021   Procedure: REVERSE SHOULDER ARTHROPLASTY;  Surgeon: Micheline Ahr, MD;  Location: [facility] ORS;  Service: Orthopedics;  Laterality: Left;   STRABISMUS SURGERY Bilateral 06/10/2017   Procedure: REPAIR STRABISMUS BILATERAL;  Surgeon: Dorothey Gate, MD;  Location: [facility];  Service: Ophthalmology;  Laterality: Bilateral;   TONSILLECTOMY AND ADENOIDECTOMY     TOTAL KNEE ARTHROPLASTY  Left 2008   TOTAL SHOULDER ARTHROPLASTY Left 03/04/2021   Procedure: Total Shoulder Arthroplasty;  Surgeon: Micheline Ahr, MD;  Location: [facility] ORS;  Service: Orthopedics;  Laterality: Left;   ULNAR NERVE TRANSPOSITION Bilateral 1989     Medications:  Outpatient Encounter Medications as of 05/14/2024  Medication Sig   ACCU-CHEK GUIDE test strip TEST DAILY AS DIRECTED   Accu-Chek Softclix Lancets lancets daily.   allopurinol  (ZYLOPRIM ) 100 MG tablet TAKE 2 TABLETS(200 MG) BY MOUTH TWICE DAILY (Patient taking differently: 300 mg daily.)   atorvastatin  (LIPITOR) 20 MG tablet Take 20 mg by mouth every other day.   Cholecalciferol (VITAMIN D) 50 MCG (2000 UT) tablet Take 2,000 Units by mouth daily.   colchicine  0.6 MG tablet Take 1 tablet (0.6 mg total) by mouth daily. As needed for gout pain.   cyclobenzaprine  (FLEXERIL ) 10 MG tablet Take 1 tablet (10 mg total) by mouth 3 (three) times daily as needed for muscle spasms.   Dextran 70-Hypromellose (ARTIFICIAL TEARS PF  OP) Place 1 drop into both eyes 2 (two) times daily as needed (dry eyes).   ENTRESTO  24-26 MG Take 1 tablet by mouth 2 (two) times daily.   metolazone  (ZAROXOLYN ) 2.5 MG tablet Take 2.5 mg by mouth daily as needed (fluid retention).   metoprolol  succinate (TOPROL -XL) 50 MG 24 hr tablet Take 50 mg by mouth in the morning. Take with or immediately following a meal.   mirabegron ER (MYRBETRIQ) 25 MG TB24 tablet Take 25 mg by mouth daily.   omeprazole (PRILOSEC) 20 MG capsule Take 20 mg by mouth daily.   oxyCODONE -acetaminophen  (PERCOCET/ROXICET) 5-325 MG tablet Take 1 tablet by mouth daily as needed.   potassium chloride  (MICRO-K ) 10 MEQ CR capsule Take 10 mEq by mouth in the morning.   rivaroxaban  (XARELTO ) 20 MG TABS tablet Take 20 mg by mouth in the morning.   Semaglutide, 1 MG/DOSE, (OZEMPIC, 1 MG/DOSE,) 2 MG/1.5ML SOPN Inject 1 mg into the skin once a week.   sertraline  (ZOLOFT ) 100 MG tablet Take 100 mg by mouth in the morning.    spironolactone  (ALDACTONE ) 25 MG tablet Take 25 mg by mouth in the morning.   torsemide  (DEMADEX ) 100 MG tablet Take 50 mg by mouth in the morning. 50 mg once a day   cefadroxil  (DURICEF) 500 MG capsule Take 2 capsules (1,000 mg total) by mouth 2 (two) times daily. (Patient not taking: Reported on 05/14/2024)   cloNIDine  (CATAPRES ) 0.1 MG tablet Take 0.1 mg by mouth daily as needed (if pt if bp  > 160). (Patient not taking: Reported on 05/14/2024)   gabapentin  (NEURONTIN ) 300 MG capsule Take 900 mg by mouth 2 (two) times daily. (Patient not taking: Reported on 05/14/2024)   nitroGLYCERIN  (NITRODUR - DOSED IN MG/24 HR) 0.2 mg/hr patch Place 1 patch (0.2 mg total) onto the skin daily. (Patient not taking: Reported on 05/14/2024)   No facility-administered encounter medications on file as of 05/14/2024.    Allergies:  Allergies  Allergen Reactions   Penicillins Anaphylaxis    Reports an anaphylactic reaction when he was 13 to IM penicillin.  Has tolerated multiple cephalosporins in the past.   Tolerated Cephalosporin Date: 04/30/21.   Tolerated Cephalosporin Date: 09/30/21.       Alfalfa     Burning in sinus    Dymista [Azelastine-Fluticasone] Other (See Comments)    Burning sensation    Flonase [Fluticasone Propionate] Other (See Comments)    burning   Hydrocodone Other (See Comments)    SWEATING   Warfarin And Related Other (See Comments)    Pt states it makes his INR critical levels   Doxycycline  Dermatitis    Family History: Family History  Problem Relation Age of Onset   CVA Mother    Liver cancer Father     Social History: Social History   Tobacco Use   Smoking status: Former    Current packs/day: 0.00    Average packs/day: 0.5 packs/day for 40.0 years (20.0 ttl pk-yrs)    Types: Cigarettes    Start date: 07/15/1971    Quit date: 07/15/2011    Years since quitting: 12.8   Smokeless tobacco: Former    Types: Associate Professor status: Never Used  Substance Use  Topics   Alcohol use: Not Currently    Comment: 2 beers a month   Drug use: No   Social History   Social History Narrative   Lives at home with spouse    Caffeine 2 cups  of coffee daily    Right handed.       Are you right handed or left handed? Right Handed    Are you currently employed ? No    What is your current occupation?   Do you live at home alone? No    Who lives with you? Wife Kathaleen Pale    What type of home do you live in: 1 story or 2 story? Lives in a one story home         Vital Signs:  BP 103/66   Pulse 70   Ht 6' (1.829 m)   Wt 260 lb (117.9 kg)   SpO2 95%   BMI 35.26 kg/m    General Medical Exam:   Skin:  Midfoot of both soles of the feet show moderately sized ulceration with dry gangrene at the center and periphery which is wet.    Neurological Exam: MENTAL STATUS including orientation to time, place, person, recent and remote memory, attention span and concentration, language, and fund of knowledge is normal.  Speech is not dysarthric.  CRANIAL NERVES: II:  No visual field defects.     III-IV-VI: Pupils equal round and reactive to light.  Normal conjugate, extra-ocular eye movements in all directions of gaze.  No nystagmus.  No ptosis.   V:  Normal facial sensation.    VII:  Normal facial symmetry and movements.   VIII:  Normal hearing and vestibular function.   IX-X:  Normal palatal movement.   XI:  Normal shoulder shrug and head rotation.   XII:  Normal tongue strength and range of motion, no deviation or fasciculation.  MOTOR:  Left foot with Charcot arthropathy.  No atrophy, fasciculations or abnormal movements.  No pronator drift.   Upper Extremity:  Right  Left  Deltoid  5/5   5/5   Biceps  5/5   5/5   Triceps  5/5   5/5   Wrist extensors  5/5   5/5   Wrist flexors  5/5   5/5   Finger extensors  5/5   5/5   Finger flexors  5/5   5/5   Dorsal interossei  5/5   5/5   Abductor pollicis  5/5   5/5   Tone (Ashworth scale)  0  0   Lower  Extremity:  Right  Left  Hip flexors  5/5   5/5   Knee flexors  5/5   5/5   Knee extensors  5/5   5/5   Dorsiflexors  5/5   5/5   Plantarflexors  5/5   5/5   Toe extensors  5/5   5/5   Toe flexors  5/5   5/5   Tone (Ashworth scale)  0  0   MSRs:                                           Right        Left brachioradialis 2+  2+  biceps 2+  2+  triceps 2+  2+  patellar 1+  1+  ankle jerk 0  0  Hoffman no  no  plantar response down  down   SENSORY:  Vibration, pin prick, and temperature diminished in the lower leg and absent in the feet.    COORDINATION/GAIT: Normal finger-to- nose-finger.  Intact rapid alternating movements bilaterally.  Gait is unsteady, assisted with cane.  Thank you for allowing me to participate in patient's care.  If I can answer any additional questions, I would be pleased to do so.    Sincerely,    Faigy Stretch K. Lydia Sams, DO

## 2024-05-14 NOTE — Patient Instructions (Signed)
 Follow-up with Dr. Cherl Corner for bilateral feet ulcerations  Start using a rollator  Check labs

## 2024-05-18 ENCOUNTER — Encounter: Payer: Self-pay | Admitting: Neurology

## 2024-09-19 ENCOUNTER — Ambulatory Visit (INDEPENDENT_AMBULATORY_CARE_PROVIDER_SITE_OTHER)

## 2024-09-19 ENCOUNTER — Other Ambulatory Visit: Payer: Self-pay

## 2024-09-19 DIAGNOSIS — Z23 Encounter for immunization: Secondary | ICD-10-CM | POA: Diagnosis not present

## 2024-09-21 ENCOUNTER — Ambulatory Visit
